# Patient Record
Sex: Female | Born: 1980 | Race: White | Hispanic: No | Smoking: Current every day smoker
Health system: Southern US, Community
[De-identification: ages and names within clinical notes are randomized; demographics above are authoritative.]

## PROBLEM LIST (undated history)

## (undated) ENCOUNTER — Inpatient Hospital Stay (HOSPITAL_COMMUNITY): Payer: Self-pay

## (undated) ENCOUNTER — Emergency Department (HOSPITAL_COMMUNITY): Disposition: A | Discharge status: Home / Self Care | Payer: Medicaid Other

## (undated) DIAGNOSIS — IMO0002 Reserved for concepts with insufficient information to code with codable children: Secondary | ICD-10-CM

## (undated) DIAGNOSIS — D649 Anemia, unspecified: Secondary | ICD-10-CM

## (undated) DIAGNOSIS — R51 Headache: Secondary | ICD-10-CM

## (undated) DIAGNOSIS — J189 Pneumonia, unspecified organism: Secondary | ICD-10-CM

## (undated) DIAGNOSIS — R87619 Unspecified abnormal cytological findings in specimens from cervix uteri: Secondary | ICD-10-CM

## (undated) DIAGNOSIS — O139 Gestational [pregnancy-induced] hypertension without significant proteinuria, unspecified trimester: Secondary | ICD-10-CM

## (undated) DIAGNOSIS — F419 Anxiety disorder, unspecified: Secondary | ICD-10-CM

## (undated) DIAGNOSIS — F53 Postpartum depression: Secondary | ICD-10-CM

## (undated) DIAGNOSIS — A5901 Trichomonal vulvovaginitis: Secondary | ICD-10-CM

## (undated) DIAGNOSIS — F32A Depression, unspecified: Secondary | ICD-10-CM

## (undated) DIAGNOSIS — O4700 False labor before 37 completed weeks of gestation, unspecified trimester: Secondary | ICD-10-CM

## (undated) DIAGNOSIS — O34219 Maternal care for unspecified type scar from previous cesarean delivery: Secondary | ICD-10-CM

## (undated) DIAGNOSIS — F1920 Other psychoactive substance dependence, uncomplicated: Secondary | ICD-10-CM

## (undated) DIAGNOSIS — K219 Gastro-esophageal reflux disease without esophagitis: Secondary | ICD-10-CM

## (undated) DIAGNOSIS — R87629 Unspecified abnormal cytological findings in specimens from vagina: Secondary | ICD-10-CM

## (undated) DIAGNOSIS — Z789 Other specified health status: Secondary | ICD-10-CM

## (undated) DIAGNOSIS — F329 Major depressive disorder, single episode, unspecified: Secondary | ICD-10-CM

## (undated) HISTORY — DX: Depression, unspecified: F32.A

## (undated) HISTORY — DX: Postpartum depression: F53.0

## (undated) HISTORY — PX: WISDOM TOOTH EXTRACTION: SHX21

## (undated) HISTORY — DX: Reserved for concepts with insufficient information to code with codable children: IMO0002

## (undated) HISTORY — DX: Pneumonia, unspecified organism: J18.9

## (undated) HISTORY — DX: Unspecified abnormal cytological findings in specimens from cervix uteri: R87.619

## (undated) HISTORY — DX: Major depressive disorder, single episode, unspecified: F32.9

## (undated) HISTORY — DX: Anxiety disorder, unspecified: F41.9

## (undated) HISTORY — DX: Gestational (pregnancy-induced) hypertension without significant proteinuria, unspecified trimester: O13.9

## (undated) HISTORY — DX: Anemia, unspecified: D64.9

## (undated) HISTORY — DX: False labor before 37 completed weeks of gestation, unspecified trimester: O47.00

## (undated) HISTORY — DX: Unspecified abnormal cytological findings in specimens from vagina: R87.629

## (undated) HISTORY — PX: APPENDECTOMY: SHX54

## (undated) HISTORY — PX: COLPOSCOPY: SHX161

---
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
## 2001-02-11 ENCOUNTER — Encounter: Payer: Self-pay | Admitting: Emergency Medicine

## 2001-02-11 ENCOUNTER — Emergency Department (HOSPITAL_COMMUNITY): Admission: EM | Admit: 2001-02-11 | Discharge: 2001-02-11 | Payer: Self-pay | Admitting: Emergency Medicine

## 2001-05-31 ENCOUNTER — Encounter: Admission: RE | Admit: 2001-05-31 | Discharge: 2001-05-31 | Payer: Self-pay | Admitting: Internal Medicine

## 2001-07-05 ENCOUNTER — Encounter: Admission: RE | Admit: 2001-07-05 | Discharge: 2001-07-05 | Payer: Self-pay | Admitting: Internal Medicine

## 2001-07-07 ENCOUNTER — Encounter: Admission: RE | Admit: 2001-07-07 | Discharge: 2001-07-07 | Payer: Self-pay | Admitting: Internal Medicine

## 2001-07-15 ENCOUNTER — Emergency Department (HOSPITAL_COMMUNITY): Admission: EM | Admit: 2001-07-15 | Discharge: 2001-07-15 | Payer: Self-pay | Admitting: Emergency Medicine

## 2001-07-15 ENCOUNTER — Encounter: Payer: Self-pay | Admitting: Emergency Medicine

## 2001-07-18 ENCOUNTER — Encounter: Admission: RE | Admit: 2001-07-18 | Discharge: 2001-07-18 | Payer: Self-pay | Admitting: Internal Medicine

## 2001-07-20 ENCOUNTER — Emergency Department (HOSPITAL_COMMUNITY): Admission: EM | Admit: 2001-07-20 | Discharge: 2001-07-20 | Payer: Self-pay | Admitting: Emergency Medicine

## 2001-09-15 ENCOUNTER — Emergency Department (HOSPITAL_COMMUNITY): Admission: EM | Admit: 2001-09-15 | Discharge: 2001-09-15 | Payer: Self-pay | Admitting: Emergency Medicine

## 2001-10-27 ENCOUNTER — Encounter: Admission: RE | Admit: 2001-10-27 | Discharge: 2001-10-27 | Payer: Self-pay | Admitting: Internal Medicine

## 2001-11-01 ENCOUNTER — Encounter: Admission: RE | Admit: 2001-11-01 | Discharge: 2001-11-01 | Payer: Self-pay | Admitting: Internal Medicine

## 2001-11-30 ENCOUNTER — Encounter: Admission: RE | Admit: 2001-11-30 | Discharge: 2001-11-30 | Payer: Self-pay | Admitting: Internal Medicine

## 2001-12-28 ENCOUNTER — Emergency Department (HOSPITAL_COMMUNITY): Admission: EM | Admit: 2001-12-28 | Discharge: 2001-12-28 | Payer: Self-pay | Admitting: Emergency Medicine

## 2002-02-22 ENCOUNTER — Encounter: Admission: RE | Admit: 2002-02-22 | Discharge: 2002-02-22 | Payer: Self-pay | Admitting: Internal Medicine

## 2002-06-19 ENCOUNTER — Inpatient Hospital Stay: Admission: AD | Admit: 2002-06-19 | Discharge: 2002-06-19 | Payer: Self-pay | Admitting: *Deleted

## 2002-06-21 ENCOUNTER — Emergency Department (HOSPITAL_COMMUNITY): Admission: EM | Admit: 2002-06-21 | Discharge: 2002-06-22 | Payer: Self-pay | Admitting: Emergency Medicine

## 2002-10-25 ENCOUNTER — Inpatient Hospital Stay (HOSPITAL_COMMUNITY): Admission: AD | Admit: 2002-10-25 | Discharge: 2002-10-25 | Payer: Self-pay | Admitting: Obstetrics and Gynecology

## 2003-01-10 ENCOUNTER — Inpatient Hospital Stay (HOSPITAL_COMMUNITY): Admission: AD | Admit: 2003-01-10 | Discharge: 2003-01-10 | Payer: Self-pay | Admitting: Obstetrics and Gynecology

## 2003-03-30 ENCOUNTER — Inpatient Hospital Stay (HOSPITAL_COMMUNITY): Admission: AD | Admit: 2003-03-30 | Discharge: 2003-03-31 | Payer: Self-pay | Admitting: *Deleted

## 2003-04-03 ENCOUNTER — Inpatient Hospital Stay (HOSPITAL_COMMUNITY): Admission: AD | Admit: 2003-04-03 | Discharge: 2003-04-03 | Payer: Self-pay | Admitting: Family Medicine

## 2003-04-08 ENCOUNTER — Emergency Department (HOSPITAL_COMMUNITY): Admission: EM | Admit: 2003-04-08 | Discharge: 2003-04-08 | Payer: Self-pay | Admitting: Emergency Medicine

## 2003-04-29 ENCOUNTER — Inpatient Hospital Stay (HOSPITAL_COMMUNITY): Admission: AD | Admit: 2003-04-29 | Discharge: 2003-04-30 | Payer: Self-pay | Admitting: Family Medicine

## 2003-05-03 ENCOUNTER — Ambulatory Visit (HOSPITAL_COMMUNITY): Admission: RE | Admit: 2003-05-03 | Discharge: 2003-05-03 | Payer: Self-pay | Admitting: *Deleted

## 2003-05-13 ENCOUNTER — Inpatient Hospital Stay (HOSPITAL_COMMUNITY): Admission: AD | Admit: 2003-05-13 | Discharge: 2003-05-13 | Payer: Self-pay | Admitting: *Deleted

## 2004-05-12 ENCOUNTER — Inpatient Hospital Stay (HOSPITAL_COMMUNITY): Admission: AD | Admit: 2004-05-12 | Discharge: 2004-05-12 | Payer: Self-pay | Admitting: Obstetrics & Gynecology

## 2004-05-21 ENCOUNTER — Inpatient Hospital Stay (HOSPITAL_COMMUNITY): Admission: RE | Admit: 2004-05-21 | Discharge: 2004-05-21 | Payer: Self-pay | Admitting: *Deleted

## 2004-05-26 ENCOUNTER — Ambulatory Visit: Payer: Self-pay | Admitting: *Deleted

## 2004-05-26 ENCOUNTER — Ambulatory Visit (HOSPITAL_COMMUNITY): Admission: RE | Admit: 2004-05-26 | Discharge: 2004-05-26 | Payer: Self-pay | Admitting: *Deleted

## 2004-06-04 ENCOUNTER — Ambulatory Visit: Payer: Self-pay | Admitting: *Deleted

## 2004-07-09 ENCOUNTER — Ambulatory Visit: Payer: Self-pay | Admitting: *Deleted

## 2004-07-21 ENCOUNTER — Ambulatory Visit (HOSPITAL_COMMUNITY): Admission: RE | Admit: 2004-07-21 | Discharge: 2004-07-21 | Payer: Self-pay | Admitting: *Deleted

## 2004-07-30 ENCOUNTER — Ambulatory Visit: Payer: Self-pay | Admitting: Family Medicine

## 2004-08-13 ENCOUNTER — Ambulatory Visit (HOSPITAL_COMMUNITY): Admission: RE | Admit: 2004-08-13 | Discharge: 2004-08-13 | Payer: Self-pay | Admitting: *Deleted

## 2004-08-13 ENCOUNTER — Ambulatory Visit: Payer: Self-pay | Admitting: Family Medicine

## 2004-09-10 ENCOUNTER — Ambulatory Visit: Payer: Self-pay | Admitting: Family Medicine

## 2004-09-17 ENCOUNTER — Ambulatory Visit: Payer: Self-pay | Admitting: *Deleted

## 2004-09-17 ENCOUNTER — Ambulatory Visit (HOSPITAL_COMMUNITY): Admission: RE | Admit: 2004-09-17 | Discharge: 2004-09-17 | Payer: Self-pay | Admitting: *Deleted

## 2004-09-18 ENCOUNTER — Inpatient Hospital Stay (HOSPITAL_COMMUNITY): Admission: RE | Admit: 2004-09-18 | Discharge: 2004-09-18 | Payer: Self-pay | Admitting: *Deleted

## 2004-09-18 ENCOUNTER — Ambulatory Visit: Payer: Self-pay | Admitting: *Deleted

## 2004-09-22 ENCOUNTER — Inpatient Hospital Stay (HOSPITAL_COMMUNITY): Admission: AD | Admit: 2004-09-22 | Discharge: 2004-09-24 | Payer: Self-pay | Admitting: Obstetrics & Gynecology

## 2004-09-22 ENCOUNTER — Ambulatory Visit: Payer: Self-pay | Admitting: Family Medicine

## 2005-02-05 ENCOUNTER — Ambulatory Visit (HOSPITAL_COMMUNITY): Admission: RE | Admit: 2005-02-05 | Discharge: 2005-02-05 | Payer: Self-pay | Admitting: Family Medicine

## 2005-02-05 ENCOUNTER — Emergency Department (HOSPITAL_COMMUNITY): Admission: EM | Admit: 2005-02-05 | Discharge: 2005-02-05 | Payer: Self-pay | Admitting: Family Medicine

## 2005-02-22 ENCOUNTER — Emergency Department (HOSPITAL_COMMUNITY): Admission: EM | Admit: 2005-02-22 | Discharge: 2005-02-22 | Payer: Self-pay | Admitting: Family Medicine

## 2005-03-24 ENCOUNTER — Inpatient Hospital Stay (HOSPITAL_COMMUNITY): Admission: AD | Admit: 2005-03-24 | Discharge: 2005-03-24 | Payer: Self-pay | Admitting: *Deleted

## 2005-03-31 ENCOUNTER — Emergency Department (HOSPITAL_COMMUNITY): Admission: EM | Admit: 2005-03-31 | Discharge: 2005-03-31 | Payer: Self-pay | Admitting: Family Medicine

## 2005-04-05 ENCOUNTER — Inpatient Hospital Stay (HOSPITAL_COMMUNITY): Admission: AD | Admit: 2005-04-05 | Discharge: 2005-04-05 | Payer: Self-pay | Admitting: Family Medicine

## 2005-04-06 ENCOUNTER — Inpatient Hospital Stay (HOSPITAL_COMMUNITY): Admission: AD | Admit: 2005-04-06 | Discharge: 2005-04-06 | Payer: Self-pay | Admitting: Obstetrics and Gynecology

## 2005-05-10 ENCOUNTER — Emergency Department (HOSPITAL_COMMUNITY): Admission: EM | Admit: 2005-05-10 | Discharge: 2005-05-10 | Payer: Self-pay | Admitting: Family Medicine

## 2005-05-22 ENCOUNTER — Emergency Department (HOSPITAL_COMMUNITY): Admission: EM | Admit: 2005-05-22 | Discharge: 2005-05-22 | Payer: Self-pay | Admitting: Family Medicine

## 2005-05-25 ENCOUNTER — Emergency Department (HOSPITAL_COMMUNITY): Admission: EM | Admit: 2005-05-25 | Discharge: 2005-05-25 | Payer: Self-pay | Admitting: Family Medicine

## 2005-06-02 ENCOUNTER — Emergency Department (HOSPITAL_COMMUNITY): Admission: EM | Admit: 2005-06-02 | Discharge: 2005-06-02 | Payer: Self-pay | Admitting: Family Medicine

## 2005-06-05 ENCOUNTER — Emergency Department (HOSPITAL_COMMUNITY): Admission: EM | Admit: 2005-06-05 | Discharge: 2005-06-05 | Payer: Self-pay | Admitting: Family Medicine

## 2005-06-07 ENCOUNTER — Inpatient Hospital Stay (HOSPITAL_COMMUNITY): Admission: AD | Admit: 2005-06-07 | Discharge: 2005-06-07 | Payer: Self-pay | Admitting: *Deleted

## 2005-07-06 ENCOUNTER — Emergency Department (HOSPITAL_COMMUNITY): Admission: EM | Admit: 2005-07-06 | Discharge: 2005-07-06 | Payer: Self-pay | Admitting: Family Medicine

## 2005-08-16 ENCOUNTER — Emergency Department (HOSPITAL_COMMUNITY): Admission: EM | Admit: 2005-08-16 | Discharge: 2005-08-16 | Payer: Self-pay | Admitting: Family Medicine

## 2005-10-11 ENCOUNTER — Emergency Department (HOSPITAL_COMMUNITY): Admission: EM | Admit: 2005-10-11 | Discharge: 2005-10-11 | Payer: Self-pay | Admitting: Emergency Medicine

## 2005-12-10 ENCOUNTER — Emergency Department (HOSPITAL_COMMUNITY): Admission: EM | Admit: 2005-12-10 | Discharge: 2005-12-11 | Payer: Self-pay | Admitting: Emergency Medicine

## 2005-12-20 ENCOUNTER — Emergency Department (HOSPITAL_COMMUNITY): Admission: EM | Admit: 2005-12-20 | Discharge: 2005-12-20 | Payer: Self-pay | Admitting: Family Medicine

## 2005-12-22 ENCOUNTER — Inpatient Hospital Stay (HOSPITAL_COMMUNITY): Admission: AD | Admit: 2005-12-22 | Discharge: 2005-12-22 | Payer: Self-pay | Admitting: Gynecology

## 2006-01-03 ENCOUNTER — Inpatient Hospital Stay (HOSPITAL_COMMUNITY): Admission: AD | Admit: 2006-01-03 | Discharge: 2006-01-03 | Payer: Self-pay | Admitting: Family Medicine

## 2006-02-14 ENCOUNTER — Inpatient Hospital Stay (HOSPITAL_COMMUNITY): Admission: AD | Admit: 2006-02-14 | Discharge: 2006-02-14 | Payer: Self-pay | Admitting: Obstetrics & Gynecology

## 2006-02-25 ENCOUNTER — Inpatient Hospital Stay (HOSPITAL_COMMUNITY): Admission: AD | Admit: 2006-02-25 | Discharge: 2006-02-25 | Payer: Self-pay | Admitting: Obstetrics & Gynecology

## 2006-03-19 ENCOUNTER — Emergency Department (HOSPITAL_COMMUNITY): Admission: EM | Admit: 2006-03-19 | Discharge: 2006-03-19 | Payer: Self-pay | Admitting: Emergency Medicine

## 2006-04-05 ENCOUNTER — Inpatient Hospital Stay (HOSPITAL_COMMUNITY): Admission: AD | Admit: 2006-04-05 | Discharge: 2006-04-05 | Payer: Self-pay | Admitting: Obstetrics & Gynecology

## 2006-04-12 ENCOUNTER — Ambulatory Visit (HOSPITAL_COMMUNITY): Admission: RE | Admit: 2006-04-12 | Discharge: 2006-04-12 | Payer: Self-pay | Admitting: Obstetrics & Gynecology

## 2006-04-12 ENCOUNTER — Inpatient Hospital Stay (HOSPITAL_COMMUNITY): Admission: AD | Admit: 2006-04-12 | Discharge: 2006-04-13 | Payer: Self-pay | Admitting: Obstetrics & Gynecology

## 2006-04-17 ENCOUNTER — Emergency Department (HOSPITAL_COMMUNITY): Admission: EM | Admit: 2006-04-17 | Discharge: 2006-04-17 | Payer: Self-pay | Admitting: Family Medicine

## 2006-04-27 ENCOUNTER — Ambulatory Visit (HOSPITAL_COMMUNITY): Admission: RE | Admit: 2006-04-27 | Discharge: 2006-04-27 | Payer: Self-pay | Admitting: Family Medicine

## 2006-04-28 ENCOUNTER — Inpatient Hospital Stay (HOSPITAL_COMMUNITY): Admission: AD | Admit: 2006-04-28 | Discharge: 2006-04-28 | Payer: Self-pay | Admitting: Obstetrics & Gynecology

## 2006-04-28 ENCOUNTER — Ambulatory Visit: Payer: Self-pay | Admitting: Family Medicine

## 2006-05-20 ENCOUNTER — Ambulatory Visit (HOSPITAL_COMMUNITY): Admission: RE | Admit: 2006-05-20 | Discharge: 2006-05-20 | Payer: Self-pay | Admitting: Family Medicine

## 2006-06-09 ENCOUNTER — Ambulatory Visit: Payer: Self-pay | Admitting: Obstetrics & Gynecology

## 2006-06-12 ENCOUNTER — Inpatient Hospital Stay (HOSPITAL_COMMUNITY): Admission: AD | Admit: 2006-06-12 | Discharge: 2006-06-12 | Payer: Self-pay | Admitting: Family Medicine

## 2006-06-12 ENCOUNTER — Ambulatory Visit: Payer: Self-pay | Admitting: Certified Nurse Midwife

## 2006-06-17 ENCOUNTER — Inpatient Hospital Stay (HOSPITAL_COMMUNITY): Admission: AD | Admit: 2006-06-17 | Discharge: 2006-06-18 | Payer: Self-pay | Admitting: Gynecology

## 2006-06-17 ENCOUNTER — Ambulatory Visit: Payer: Self-pay | Admitting: Obstetrics and Gynecology

## 2006-06-23 ENCOUNTER — Ambulatory Visit: Payer: Self-pay | Admitting: Family Medicine

## 2006-07-07 ENCOUNTER — Ambulatory Visit: Payer: Self-pay | Admitting: *Deleted

## 2006-07-14 ENCOUNTER — Ambulatory Visit: Payer: Self-pay | Admitting: Obstetrics & Gynecology

## 2006-07-15 ENCOUNTER — Ambulatory Visit (HOSPITAL_COMMUNITY): Admission: RE | Admit: 2006-07-15 | Discharge: 2006-07-15 | Payer: Self-pay | Admitting: Family Medicine

## 2006-07-17 ENCOUNTER — Ambulatory Visit: Payer: Self-pay | Admitting: Obstetrics and Gynecology

## 2006-07-17 ENCOUNTER — Inpatient Hospital Stay (HOSPITAL_COMMUNITY): Admission: AD | Admit: 2006-07-17 | Discharge: 2006-07-17 | Payer: Self-pay | Admitting: Obstetrics & Gynecology

## 2006-07-22 ENCOUNTER — Ambulatory Visit (HOSPITAL_COMMUNITY): Admission: RE | Admit: 2006-07-22 | Discharge: 2006-07-22 | Payer: Self-pay | Admitting: Family Medicine

## 2006-07-25 ENCOUNTER — Ambulatory Visit: Payer: Self-pay | Admitting: Obstetrics & Gynecology

## 2006-07-25 ENCOUNTER — Ambulatory Visit (HOSPITAL_COMMUNITY): Admission: RE | Admit: 2006-07-25 | Discharge: 2006-07-25 | Payer: Self-pay | Admitting: Family Medicine

## 2006-07-28 ENCOUNTER — Ambulatory Visit: Payer: Self-pay | Admitting: Gynecology

## 2006-07-28 ENCOUNTER — Inpatient Hospital Stay (HOSPITAL_COMMUNITY): Admission: AD | Admit: 2006-07-28 | Discharge: 2006-08-01 | Payer: Self-pay | Admitting: Gynecology

## 2006-08-06 ENCOUNTER — Inpatient Hospital Stay (HOSPITAL_COMMUNITY): Admission: AD | Admit: 2006-08-06 | Discharge: 2006-08-06 | Payer: Self-pay | Admitting: Obstetrics and Gynecology

## 2006-09-02 ENCOUNTER — Inpatient Hospital Stay (HOSPITAL_COMMUNITY): Admission: AD | Admit: 2006-09-02 | Discharge: 2006-09-02 | Payer: Self-pay | Admitting: Obstetrics and Gynecology

## 2006-09-03 ENCOUNTER — Inpatient Hospital Stay (HOSPITAL_COMMUNITY): Admission: AD | Admit: 2006-09-03 | Discharge: 2006-09-03 | Payer: Self-pay | Admitting: Obstetrics & Gynecology

## 2006-09-13 ENCOUNTER — Inpatient Hospital Stay (HOSPITAL_COMMUNITY): Admission: AD | Admit: 2006-09-13 | Discharge: 2006-09-13 | Payer: Self-pay | Admitting: Obstetrics & Gynecology

## 2006-12-04 ENCOUNTER — Emergency Department (HOSPITAL_COMMUNITY): Admission: EM | Admit: 2006-12-04 | Discharge: 2006-12-04 | Payer: Self-pay | Admitting: Emergency Medicine

## 2007-01-03 ENCOUNTER — Emergency Department (HOSPITAL_COMMUNITY): Admission: EM | Admit: 2007-01-03 | Discharge: 2007-01-03 | Payer: Self-pay | Admitting: Family Medicine

## 2007-09-10 ENCOUNTER — Emergency Department (HOSPITAL_COMMUNITY): Admission: EM | Admit: 2007-09-10 | Discharge: 2007-09-10 | Payer: Self-pay | Admitting: Family Medicine

## 2008-01-26 HISTORY — PX: DILATION AND CURETTAGE OF UTERUS: SHX78

## 2008-03-04 ENCOUNTER — Emergency Department (HOSPITAL_COMMUNITY): Admission: EM | Admit: 2008-03-04 | Discharge: 2008-03-04 | Payer: Self-pay | Admitting: Family Medicine

## 2008-05-13 ENCOUNTER — Emergency Department (HOSPITAL_COMMUNITY): Admission: EM | Admit: 2008-05-13 | Discharge: 2008-05-13 | Payer: Self-pay | Admitting: Emergency Medicine

## 2008-06-20 ENCOUNTER — Emergency Department (HOSPITAL_COMMUNITY): Admission: EM | Admit: 2008-06-20 | Discharge: 2008-06-20 | Payer: Self-pay | Admitting: Emergency Medicine

## 2008-09-14 ENCOUNTER — Emergency Department (HOSPITAL_COMMUNITY): Admission: EM | Admit: 2008-09-14 | Discharge: 2008-09-14 | Payer: Self-pay | Admitting: Family Medicine

## 2008-10-09 ENCOUNTER — Emergency Department (HOSPITAL_COMMUNITY): Admission: EM | Admit: 2008-10-09 | Discharge: 2008-10-09 | Payer: Self-pay | Admitting: Family Medicine

## 2008-10-26 ENCOUNTER — Emergency Department (HOSPITAL_COMMUNITY): Admission: EM | Admit: 2008-10-26 | Discharge: 2008-10-26 | Payer: Self-pay | Admitting: Family Medicine

## 2008-10-31 ENCOUNTER — Emergency Department (HOSPITAL_COMMUNITY): Admission: EM | Admit: 2008-10-31 | Discharge: 2008-10-31 | Payer: Self-pay | Admitting: Emergency Medicine

## 2008-11-12 ENCOUNTER — Emergency Department (HOSPITAL_COMMUNITY): Admission: EM | Admit: 2008-11-12 | Discharge: 2008-11-12 | Payer: Self-pay | Admitting: Emergency Medicine

## 2009-02-05 ENCOUNTER — Emergency Department (HOSPITAL_COMMUNITY): Admission: EM | Admit: 2009-02-05 | Discharge: 2009-02-05 | Payer: Self-pay | Admitting: Family Medicine

## 2009-02-10 ENCOUNTER — Emergency Department (HOSPITAL_COMMUNITY): Admission: EM | Admit: 2009-02-10 | Discharge: 2009-02-10 | Payer: Self-pay | Admitting: Emergency Medicine

## 2009-03-13 ENCOUNTER — Emergency Department (HOSPITAL_COMMUNITY): Admission: EM | Admit: 2009-03-13 | Discharge: 2009-03-13 | Payer: Self-pay | Admitting: Family Medicine

## 2009-06-03 ENCOUNTER — Emergency Department (HOSPITAL_COMMUNITY): Admission: EM | Admit: 2009-06-03 | Discharge: 2009-06-03 | Payer: Self-pay | Admitting: Family Medicine

## 2009-06-14 ENCOUNTER — Emergency Department (HOSPITAL_COMMUNITY): Admission: EM | Admit: 2009-06-14 | Discharge: 2009-06-15 | Payer: Self-pay | Admitting: Emergency Medicine

## 2009-09-09 ENCOUNTER — Emergency Department (HOSPITAL_COMMUNITY): Admission: EM | Admit: 2009-09-09 | Discharge: 2009-09-09 | Payer: Self-pay | Admitting: Family Medicine

## 2009-09-21 ENCOUNTER — Emergency Department (HOSPITAL_COMMUNITY): Admission: EM | Admit: 2009-09-21 | Discharge: 2009-09-21 | Payer: Self-pay | Admitting: Family Medicine

## 2009-09-23 ENCOUNTER — Emergency Department (HOSPITAL_COMMUNITY): Admission: EM | Admit: 2009-09-23 | Discharge: 2009-09-23 | Payer: Self-pay | Admitting: Family Medicine

## 2009-09-27 ENCOUNTER — Ambulatory Visit: Payer: Self-pay | Admitting: Nurse Practitioner

## 2009-09-27 ENCOUNTER — Inpatient Hospital Stay (HOSPITAL_COMMUNITY): Admission: AD | Admit: 2009-09-27 | Discharge: 2009-09-27 | Payer: Self-pay | Admitting: Obstetrics and Gynecology

## 2009-10-02 ENCOUNTER — Emergency Department (HOSPITAL_COMMUNITY): Admission: EM | Admit: 2009-10-02 | Discharge: 2009-10-02 | Payer: Self-pay | Admitting: Family Medicine

## 2009-10-31 ENCOUNTER — Ambulatory Visit (HOSPITAL_COMMUNITY): Admission: AD | Admit: 2009-10-31 | Discharge: 2009-10-31 | Payer: Self-pay | Admitting: Obstetrics & Gynecology

## 2010-01-31 ENCOUNTER — Emergency Department (HOSPITAL_COMMUNITY)
Admission: EM | Admit: 2010-01-31 | Discharge: 2010-01-31 | Payer: Self-pay | Source: Home / Self Care | Admitting: Family Medicine

## 2010-02-15 ENCOUNTER — Encounter: Payer: Self-pay | Admitting: *Deleted

## 2010-02-15 ENCOUNTER — Encounter: Payer: Self-pay | Admitting: Obstetrics & Gynecology

## 2010-02-18 ENCOUNTER — Emergency Department (HOSPITAL_COMMUNITY)
Admission: EM | Admit: 2010-02-18 | Discharge: 2010-02-18 | Payer: Self-pay | Source: Home / Self Care | Admitting: Family Medicine

## 2010-02-22 ENCOUNTER — Emergency Department (HOSPITAL_COMMUNITY)
Admission: EM | Admit: 2010-02-22 | Discharge: 2010-02-22 | Payer: Self-pay | Source: Home / Self Care | Admitting: Family Medicine

## 2010-02-22 LAB — WET PREP, GENITAL
Clue Cells Wet Prep HPF POC: NONE SEEN
Trich, Wet Prep: NONE SEEN

## 2010-02-23 LAB — POCT URINALYSIS DIPSTICK
Bilirubin Urine: NEGATIVE
Ketones, ur: NEGATIVE mg/dL
Nitrite: NEGATIVE
Urine Glucose, Fasting: NEGATIVE mg/dL

## 2010-02-24 LAB — GC/CHLAMYDIA PROBE AMP, GENITAL
Chlamydia, DNA Probe: NEGATIVE
GC Probe Amp, Genital: NEGATIVE

## 2010-04-08 LAB — CBC
MCV: 99.9 fL (ref 78.0–100.0)
Platelets: 257 10*3/uL (ref 150–400)
RDW: 14.1 % (ref 11.5–15.5)
WBC: 11.5 10*3/uL — ABNORMAL HIGH (ref 4.0–10.5)

## 2010-04-09 LAB — URINE CULTURE: Culture  Setup Time: 201108161847

## 2010-04-09 LAB — POCT URINALYSIS DIPSTICK
Glucose, UA: NEGATIVE mg/dL
Hgb urine dipstick: NEGATIVE
Nitrite: NEGATIVE
Urobilinogen, UA: 0.2 mg/dL (ref 0.0–1.0)

## 2010-04-09 LAB — WET PREP, GENITAL: Yeast Wet Prep HPF POC: NONE SEEN

## 2010-04-09 LAB — URINALYSIS, ROUTINE W REFLEX MICROSCOPIC
Bilirubin Urine: NEGATIVE
Nitrite: NEGATIVE
Specific Gravity, Urine: 1.01 (ref 1.005–1.030)
Urobilinogen, UA: 0.2 mg/dL (ref 0.0–1.0)

## 2010-04-09 LAB — GC/CHLAMYDIA PROBE AMP, GENITAL
Chlamydia, DNA Probe: NEGATIVE
GC Probe Amp, Genital: NEGATIVE

## 2010-04-14 ENCOUNTER — Inpatient Hospital Stay (HOSPITAL_COMMUNITY): Admission: RE | Admit: 2010-04-14 | Payer: Self-pay | Source: Ambulatory Visit

## 2010-04-14 LAB — CBC
HCT: 38.8 % (ref 36.0–46.0)
Hemoglobin: 13.8 g/dL (ref 12.0–15.0)
MCHC: 35.6 g/dL (ref 30.0–36.0)
MCV: 99.2 fL (ref 78.0–100.0)
Platelets: 244 10*3/uL (ref 150–400)
RBC: 3.91 MIL/uL (ref 3.87–5.11)
RDW: 13.3 % (ref 11.5–15.5)
WBC: 8.4 10*3/uL (ref 4.0–10.5)

## 2010-04-14 LAB — POCT I-STAT, CHEM 8
BUN: 9 mg/dL (ref 6–23)
Chloride: 105 mEq/L (ref 96–112)
Creatinine, Ser: 0.5 mg/dL (ref 0.4–1.2)
Potassium: 3.8 mEq/L (ref 3.5–5.1)
Sodium: 142 mEq/L (ref 135–145)
TCO2: 27 mmol/L (ref 0–100)

## 2010-04-14 LAB — WET PREP, GENITAL
Trich, Wet Prep: NONE SEEN
WBC, Wet Prep HPF POC: NONE SEEN
Yeast Wet Prep HPF POC: NONE SEEN

## 2010-04-14 LAB — DIFFERENTIAL
Basophils Absolute: 0 10*3/uL (ref 0.0–0.1)
Basophils Relative: 1 % (ref 0–1)
Eosinophils Absolute: 0.1 10*3/uL (ref 0.0–0.7)
Eosinophils Relative: 2 % (ref 0–5)
Lymphocytes Relative: 29 % (ref 12–46)
Lymphs Abs: 2.5 10*3/uL (ref 0.7–4.0)
Monocytes Absolute: 0.6 10*3/uL (ref 0.1–1.0)
Monocytes Relative: 7 % (ref 3–12)
Neutro Abs: 5.2 10*3/uL (ref 1.7–7.7)
Neutrophils Relative %: 62 % (ref 43–77)

## 2010-04-14 LAB — POCT URINALYSIS DIP (DEVICE)
Bilirubin Urine: NEGATIVE
Hgb urine dipstick: NEGATIVE
Ketones, ur: NEGATIVE mg/dL
pH: 7 (ref 5.0–8.0)

## 2010-04-14 LAB — POCT PREGNANCY, URINE: Preg Test, Ur: NEGATIVE

## 2010-04-15 LAB — WET PREP, GENITAL
Trich, Wet Prep: NONE SEEN
Yeast Wet Prep HPF POC: NONE SEEN

## 2010-04-15 LAB — POCT URINALYSIS DIP (DEVICE)
Nitrite: NEGATIVE
Protein, ur: NEGATIVE mg/dL
pH: 5 (ref 5.0–8.0)

## 2010-04-15 LAB — GC/CHLAMYDIA PROBE AMP, GENITAL
Chlamydia, DNA Probe: NEGATIVE
GC Probe Amp, Genital: NEGATIVE

## 2010-04-30 LAB — POCT RAPID STREP A (OFFICE): Streptococcus, Group A Screen (Direct): NEGATIVE

## 2010-04-30 LAB — POCT URINALYSIS DIP (DEVICE)
Glucose, UA: 100 mg/dL — AB
Ketones, ur: 15 mg/dL — AB
Protein, ur: >=300 mg/dL — AB
Urobilinogen, UA: 1 mg/dL (ref 0.0–1.0)

## 2010-04-30 LAB — POCT PREGNANCY, URINE: Preg Test, Ur: NEGATIVE

## 2010-05-01 ENCOUNTER — Inpatient Hospital Stay (INDEPENDENT_AMBULATORY_CARE_PROVIDER_SITE_OTHER)
Admission: RE | Admit: 2010-05-01 | Discharge: 2010-05-01 | Disposition: A | Discharge status: Home / Self Care | Payer: Self-pay | Source: Ambulatory Visit | Attending: Family Medicine | Admitting: Family Medicine

## 2010-05-01 DIAGNOSIS — N390 Urinary tract infection, site not specified: Secondary | ICD-10-CM

## 2010-05-01 LAB — POCT PREGNANCY, URINE: Preg Test, Ur: NEGATIVE

## 2010-05-01 LAB — POCT URINALYSIS DIP (DEVICE)
Bilirubin Urine: NEGATIVE
Glucose, UA: NEGATIVE mg/dL
Ketones, ur: NEGATIVE mg/dL

## 2010-05-01 LAB — POCT RAPID STREP A (OFFICE): Streptococcus, Group A Screen (Direct): NEGATIVE

## 2010-05-02 LAB — WET PREP, GENITAL

## 2010-05-02 LAB — GC/CHLAMYDIA PROBE AMP, GENITAL: Chlamydia, DNA Probe: NEGATIVE

## 2010-05-02 LAB — POCT URINALYSIS DIP (DEVICE)
Bilirubin Urine: NEGATIVE
Glucose, UA: NEGATIVE mg/dL
Protein, ur: NEGATIVE mg/dL

## 2010-05-02 LAB — RPR: RPR Ser Ql: NONREACTIVE

## 2010-05-02 LAB — POCT PREGNANCY, URINE: Preg Test, Ur: NEGATIVE

## 2010-05-11 ENCOUNTER — Inpatient Hospital Stay (INDEPENDENT_AMBULATORY_CARE_PROVIDER_SITE_OTHER)
Admission: RE | Admit: 2010-05-11 | Discharge: 2010-05-11 | Disposition: A | Discharge status: Home / Self Care | Payer: Medicaid Other | Source: Ambulatory Visit | Attending: Family Medicine | Admitting: Family Medicine

## 2010-05-11 DIAGNOSIS — J36 Peritonsillar abscess: Secondary | ICD-10-CM

## 2010-05-12 ENCOUNTER — Emergency Department (HOSPITAL_COMMUNITY)
Admission: EM | Admit: 2010-05-12 | Discharge: 2010-05-12 | Disposition: A | Discharge status: Home / Self Care | Payer: Medicaid Other | Attending: Emergency Medicine | Admitting: Emergency Medicine

## 2010-05-12 DIAGNOSIS — R509 Fever, unspecified: Secondary | ICD-10-CM | POA: Insufficient documentation

## 2010-05-12 DIAGNOSIS — R498 Other voice and resonance disorders: Secondary | ICD-10-CM | POA: Insufficient documentation

## 2010-05-12 DIAGNOSIS — J039 Acute tonsillitis, unspecified: Secondary | ICD-10-CM | POA: Insufficient documentation

## 2010-05-12 DIAGNOSIS — Z8619 Personal history of other infectious and parasitic diseases: Secondary | ICD-10-CM | POA: Insufficient documentation

## 2010-05-12 DIAGNOSIS — R259 Unspecified abnormal involuntary movements: Secondary | ICD-10-CM | POA: Insufficient documentation

## 2010-05-12 LAB — CBC
HCT: 38.3 % (ref 36.0–46.0)
MCHC: 34.2 g/dL (ref 30.0–36.0)
MCV: 95.5 fL (ref 78.0–100.0)
Platelets: 189 10*3/uL (ref 150–400)
RDW: 13.2 % (ref 11.5–15.5)
WBC: 19.5 10*3/uL — ABNORMAL HIGH (ref 4.0–10.5)

## 2010-05-12 LAB — DIFFERENTIAL
Eosinophils Absolute: 0 10*3/uL (ref 0.0–0.7)
Eosinophils Relative: 0 % (ref 0–5)
Lymphocytes Relative: 7 % — ABNORMAL LOW (ref 12–46)
Lymphs Abs: 1.4 10*3/uL (ref 0.7–4.0)
Monocytes Absolute: 1.1 10*3/uL — ABNORMAL HIGH (ref 0.1–1.0)

## 2010-05-12 LAB — RAPID STREP SCREEN (MED CTR MEBANE ONLY): Streptococcus, Group A Screen (Direct): NEGATIVE

## 2010-05-12 LAB — POCT URINALYSIS DIP (DEVICE)
Glucose, UA: NEGATIVE mg/dL
Specific Gravity, Urine: 1.025 (ref 1.005–1.030)
Urobilinogen, UA: 0.2 mg/dL (ref 0.0–1.0)

## 2010-05-12 LAB — POCT PREGNANCY, URINE: Preg Test, Ur: NEGATIVE

## 2010-05-26 NOTE — Consult Note (Signed)
  Erica [name], Erica [name]                  ACCOUNT NO.:  [id number]  MEDICAL RECORD NO.:  [id number]           PATIENT TYPE:  E  LOCATION:  [facility]                         FACILITY:  [facility]  PHYSICIAN:  Rodolfo Gaster H. Pollyann Kennedy, MD     DATE OF BIRTH:  26-Jan-1980  DATE OF CONSULTATION:  05/12/2010 DATE OF DISCHARGE:  05/12/2010                                CONSULTATION   REASON FOR CONSULTATION:  Severe sore throat.  HISTORY:  This is a 30 year old lady who started having bad sore throat Saturday night.  She saw Dr. Lazarus Salines in the office yesterday and was diagnosed with necrotizing tonsillitis.  Strep test has been negative twice.  She was started on amoxicillin yesterday and got worse in the past 24 hours, came to emergency department a little bit afternoon today.  She was found to have leukocytosis.  She is febrile.  She has not been eating much, has been drinking on and off, and last voided this morning.  She has a history of smoking about a pack per day, but has not smoked in the last couple of days.  No prior history of tonsil problems. She has had occasional sore throat, but nothing like this.  PHYSICAL EXAMINATION:  GENERAL:  Healthy and somewhat angry-appearing lady.  She is breathing well.  Her voice is slightly muffled.  She has minimal trismus. HEENT:  Oral cavity and pharynx reveals tongue, floor of mouth, and soft palate appear normal.  The tonsils are very large, the right being larger than the left, and there is necrotic-type exudate on the surface bilaterally, but more so on the right.  There is no evidence of edema of the soft palate and no uvular edema.  There is slightly tender adenopathy in the right level II area.  No other adenopathy is palpable.  IMPRESSION:  Persisting necrotizing tonsillitis, most likely viral in origin as we are seeing a fair amount of this in the past several weeks. We discussed the importance of keeping hydrated.  She needs to drink enough so that she  urinates at least 4 times a day.  If she falls below that, she needs to follow up for additional intravenous fluids.  I offered her the opportunity to stay in the hospital today to continue IV fluids, which she declined.  I think that is reasonable as long as she does continue to drink.  She is instructed to contact our office immediately if she gets any worse or if she stops voiding.  We discussed unfortunately there is nothing that will make this get better faster and that supportive symptomatic treatment is the only real treatment and monitoring of fluid status.  She will follow up with [place] if she does not start to get better in the next few days.  She will follow up sooner if she gets any worse.     Jahaziel Francois H. Pollyann Kennedy, MD     JHR/MEDQ  D:  05/12/2010  T:  05/13/2010  Job:  [id number]  Electronically Signed by Serena Colonel MD on 05/26/2010 09:25:39 PM

## 2010-06-07 ENCOUNTER — Inpatient Hospital Stay (HOSPITAL_COMMUNITY)
Admission: AD | Admit: 2010-06-07 | Discharge: 2010-06-07 | Disposition: A | Discharge status: Home / Self Care | Payer: Medicaid Other | Source: Ambulatory Visit | Attending: Obstetrics & Gynecology | Admitting: Obstetrics & Gynecology

## 2010-06-07 ENCOUNTER — Inpatient Hospital Stay (HOSPITAL_COMMUNITY): Payer: Medicaid Other

## 2010-06-07 DIAGNOSIS — R109 Unspecified abdominal pain: Secondary | ICD-10-CM | POA: Insufficient documentation

## 2010-06-07 LAB — CBC
MCV: 96.5 fL (ref 78.0–100.0)
Platelets: 254 10*3/uL (ref 150–400)
RBC: 4 MIL/uL (ref 3.87–5.11)
RDW: 13.2 % (ref 11.5–15.5)
WBC: 9.5 10*3/uL (ref 4.0–10.5)

## 2010-06-07 LAB — WET PREP, GENITAL
Clue Cells Wet Prep HPF POC: NONE SEEN
Trich, Wet Prep: NONE SEEN
Yeast Wet Prep HPF POC: NONE SEEN

## 2010-06-07 LAB — URINALYSIS, ROUTINE W REFLEX MICROSCOPIC
Bilirubin Urine: NEGATIVE
Glucose, UA: NEGATIVE mg/dL
Ketones, ur: NEGATIVE mg/dL
Protein, ur: NEGATIVE mg/dL
Urobilinogen, UA: 0.2 mg/dL (ref 0.0–1.0)

## 2010-06-08 LAB — GC/CHLAMYDIA PROBE AMP, GENITAL
Chlamydia, DNA Probe: NEGATIVE
GC Probe Amp, Genital: NEGATIVE

## 2010-06-09 NOTE — Discharge Summary (Signed)
[name], [name]                   ACCOUNT NO.:  [id number]   MEDICAL RECORD NO.:  [id number]          PATIENT TYPE:  INP   LOCATION:                                FACILITY:  [facility]   PHYSICIAN:  Phil D. Okey Dupre, M.D.     DATE OF BIRTH:  1980/06/04   DATE OF ADMISSION:  07/28/2006  DATE OF DISCHARGE:  08/01/2006                               DISCHARGE SUMMARY   ADMISSION DIAGNOSES:  A 30 year old, G5, P3-1-0-4 at 35 weeks sent for  induction for severe intrauterine growth restriction (IUGR).   DISCHARGE DIAGNOSES:  A 30 year old, G5, P3-2-0-5, status post low  transverse cesarean section secondary to nonreassuring fetal heart tones  and severe intrauterine growth restriction (IUGR).   PROCEDURES:  Low transverse cesarean section on July 29, 2006, performed  by Dr. Mia Creek, estimated blood loss 500 mL, anesthesia epidural.  Delivery of a viable female.  Please see dictation for further  information.   HOSPITAL COURSE:  [name] is a 30 year old, G5, P3-2-0-5 who was sent  for induction at 35 weeks for severe IUGR.  She was induced with  Pitocin, but developed nonreassuring fetal heart tones and thus was  taken to low transverse C-section.  Estimated blood loss was 500 mL.  There were no complications postop.  She delivered a viable female 4  pounds 12 ounces.  Apgar's were 9 and 9.   LABORATORY DATA AND X-RAY FINDINGS:  HIV nonreactive.  Blood type O  positive.  Antibody screen negative.  Rubella nonimmune.  The patient  received vaccine.  RPR nonreactive.  Hepatitis B surface antigen  negative.   PAST MEDICAL HISTORY:  1. Hepatitis C positive.  2. Positive smoker.   CONDITION ON DISCHARGE:  Good.   DISPOSITION:  Discharged to home with baby staying in the NICU.   DISCHARGE MEDICATIONS:  1. Percocet 1-2 tablets q.4 h. p.r.n. pain.  2. Ibuprofen 600 mg one tablet t.i.d. p.r.n. pain.  3. MiraLax 17 g by mouth p.o. daily p.r.n. constipation.  4. Iron sulfate 325 mg one tablet  p.o. daily.  5. Ortho Tri-Cyclen one tablet p.o. daily.  The patient intends on IUD      at 6-week appointment.   ACTIVITY:  No heavy lifting for the next 6 weeks.  No sexual relations  for the next 6 weeks.   DIET:  Routine.   WOUND CARE:  Please pat dry incisions after shower.  Showers preferred  over bath.   FOLLOW UP:  Followup appointment in 6 weeks at [facility]  Department.      Ancil Boozer, MD      Phil D. Okey Dupre, M.D.  Electronically Signed    SA/MEDQ  D:  08/01/2006  T:  08/01/2006  Job:  [id number]

## 2010-06-09 NOTE — Op Note (Signed)
Erica [name], Erica [name]                   ACCOUNT NO.:  [id number]   MEDICAL RECORD NO.:  [id number]          PATIENT TYPE:  INP   LOCATION:  9303                          FACILITY:  [facility]   PHYSICIAN:  Ginger Carne, MD  DATE OF BIRTH:  Mar 11, 1980   DATE OF PROCEDURE:  07/29/2006  DATE OF DISCHARGE:                               OPERATIVE REPORT   PREOPERATIVE DIAGNOSIS:  Nonreassuring fetal heart rate.   POSTOPERATIVE DIAGNOSIS:  Nonreassuring fetal heart rate, preterm viable  delivery of female infant.   PROCEDURE:  Primary low transverse cesarean section.   SURGEON:  Ginger Carne, M.D.   ASSISTANT:  None.   COMPLICATIONS:  None immediate.   ESTIMATED BLOOD LOSS:  500 mL.   ANESTHESIA:  Epidural.   SPECIMEN:  Cord blood and pH.   OPERATIVE FINDINGS:  Term infant female delivered in vertex  presentation.  Apgar and weight per delivery room record, no gross  abnormalities.  Baby cried spontaneously at delivery.  Cord bloods pH  were obtained. The placenta had three-vessel cord central insertion  complete placenta.  Amniotic fluid was clear, non foul-smelling. Uterus,  tubes and ovaries showed normal decidual changes of pregnancy.   OPERATIVE PROCEDURE:  The patient prepped and draped in usual fashion  and placed in the left lateral supine position.  Betadine solution used  for antiseptic and the patient was catheterized prior to procedure.  After adequate epidural analgesia a Pfannenstiel incision was made.  The  abdomen opened.  Bladder flap incised transversely.  Lower uterine  segment incised transversely.  Baby delivered, cord clamped, cut and  infant given to the pediatric staff after bulb suctioning.  Placenta  removed manually.  Uterus inspected.  Closure of the uterine musculature  in one layers of 0 Vicryl running interlocking suture.  Bleeding points  hemostatically checked.  Blood clots removed.  Closure of the parietal.  Closure of the fascia in one layers  of 0 Vicryl suture and skin staples  for the skin.  Instrument and sponge count were correct.  The patient  tolerated the procedure well, returned to the post anesthesia recovery  room in excellent condition.     Ginger Carne, MD  Electronically Signed    SHB/MEDQ  D:  07/29/2006  T:  07/29/2006  Job:  [id number]

## 2010-07-08 ENCOUNTER — Encounter: Payer: Medicaid Other | Admitting: Obstetrics and Gynecology

## 2010-08-06 ENCOUNTER — Emergency Department (HOSPITAL_COMMUNITY)
Admission: EM | Admit: 2010-08-06 | Discharge: 2010-08-06 | Disposition: A | Discharge status: Home / Self Care | Payer: Medicaid Other | Attending: Emergency Medicine | Admitting: Emergency Medicine

## 2010-08-06 ENCOUNTER — Emergency Department (HOSPITAL_COMMUNITY): Payer: Medicaid Other

## 2010-08-06 DIAGNOSIS — R072 Precordial pain: Secondary | ICD-10-CM | POA: Insufficient documentation

## 2010-08-06 DIAGNOSIS — J3489 Other specified disorders of nose and nasal sinuses: Secondary | ICD-10-CM | POA: Insufficient documentation

## 2010-08-06 DIAGNOSIS — J40 Bronchitis, not specified as acute or chronic: Secondary | ICD-10-CM | POA: Insufficient documentation

## 2010-08-06 DIAGNOSIS — R05 Cough: Secondary | ICD-10-CM | POA: Insufficient documentation

## 2010-08-06 DIAGNOSIS — J020 Streptococcal pharyngitis: Secondary | ICD-10-CM | POA: Insufficient documentation

## 2010-08-06 DIAGNOSIS — B1920 Unspecified viral hepatitis C without hepatic coma: Secondary | ICD-10-CM | POA: Insufficient documentation

## 2010-08-06 DIAGNOSIS — R059 Cough, unspecified: Secondary | ICD-10-CM | POA: Insufficient documentation

## 2010-08-06 LAB — POCT PREGNANCY, URINE: Preg Test, Ur: NEGATIVE

## 2010-08-30 ENCOUNTER — Inpatient Hospital Stay (HOSPITAL_COMMUNITY)
Admission: AD | Admit: 2010-08-30 | Discharge: 2010-08-31 | Discharge status: Against Medical Advice | Payer: Self-pay | Source: Ambulatory Visit | Attending: Obstetrics & Gynecology | Admitting: Obstetrics & Gynecology

## 2010-08-31 NOTE — ED Notes (Signed)
Not in lobby

## 2010-09-04 ENCOUNTER — Inpatient Hospital Stay (INDEPENDENT_AMBULATORY_CARE_PROVIDER_SITE_OTHER)
Admission: RE | Admit: 2010-09-04 | Discharge: 2010-09-04 | Disposition: A | Discharge status: Home / Self Care | Payer: Self-pay | Source: Ambulatory Visit | Attending: Family Medicine | Admitting: Family Medicine

## 2010-09-04 DIAGNOSIS — N390 Urinary tract infection, site not specified: Secondary | ICD-10-CM

## 2010-09-04 LAB — POCT URINALYSIS DIP (DEVICE)
Ketones, ur: NEGATIVE mg/dL
Protein, ur: NEGATIVE mg/dL
Urobilinogen, UA: 0.2 mg/dL (ref 0.0–1.0)
pH: 5.5 (ref 5.0–8.0)

## 2010-09-23 ENCOUNTER — Encounter (HOSPITAL_COMMUNITY): Payer: Self-pay | Admitting: *Deleted

## 2010-09-23 ENCOUNTER — Inpatient Hospital Stay (HOSPITAL_COMMUNITY)
Admission: AD | Admit: 2010-09-23 | Discharge: 2010-09-23 | Disposition: A | Discharge status: Home / Self Care | Payer: Self-pay | Source: Ambulatory Visit | Attending: Obstetrics & Gynecology | Admitting: Obstetrics & Gynecology

## 2010-09-23 ENCOUNTER — Inpatient Hospital Stay (HOSPITAL_COMMUNITY)
Admission: RE | Admit: 2010-09-23 | Discharge: 2010-09-23 | Disposition: A | Discharge status: Home / Self Care | Payer: Self-pay | Source: Ambulatory Visit | Attending: Family Medicine | Admitting: Family Medicine

## 2010-09-23 DIAGNOSIS — H6690 Otitis media, unspecified, unspecified ear: Secondary | ICD-10-CM | POA: Insufficient documentation

## 2010-09-23 DIAGNOSIS — Z3201 Encounter for pregnancy test, result positive: Secondary | ICD-10-CM

## 2010-09-23 DIAGNOSIS — O99891 Other specified diseases and conditions complicating pregnancy: Secondary | ICD-10-CM | POA: Insufficient documentation

## 2010-09-23 DIAGNOSIS — H6691 Otitis media, unspecified, right ear: Secondary | ICD-10-CM | POA: Diagnosis present

## 2010-09-23 DIAGNOSIS — J069 Acute upper respiratory infection, unspecified: Secondary | ICD-10-CM | POA: Diagnosis present

## 2010-09-23 LAB — URINALYSIS, ROUTINE W REFLEX MICROSCOPIC
Bilirubin Urine: NEGATIVE
Hgb urine dipstick: NEGATIVE
Ketones, ur: NEGATIVE mg/dL
Specific Gravity, Urine: 1.01 (ref 1.005–1.030)
Urobilinogen, UA: 1 mg/dL (ref 0.0–1.0)

## 2010-09-23 MED ORDER — AMOXICILLIN 500 MG PO CAPS: 500.0000 mg | ORAL_CAPSULE | Freq: Two times a day (BID) | ORAL | Status: AC

## 2010-09-23 MED ORDER — GUAIFENESIN ER 600 MG PO TB12: 1200.0000 mg | ORAL_TABLET | Freq: Two times a day (BID) | ORAL | Status: DC

## 2010-09-23 MED ORDER — PRENATE ELITE 90-600-400 MG-MCG-MCG PO TABS: 1.0000 | ORAL_TABLET | Freq: Every day | ORAL | Status: DC

## 2010-09-23 NOTE — Progress Notes (Signed)
Pt states she states she wanted to makes sure everything is OK and "plus I have a ear infection that i didn't have to come here for but since I am here I wanted to get everything checked

## 2010-09-23 NOTE — Progress Notes (Signed)
Pt states she has an ear infectin on left side,has headaches with nose stuffiness and had a + home UPT

## 2010-09-23 NOTE — ED Provider Notes (Signed)
History     Chief Complaint  Patient presents with  . Headache   HPI 30 y.o. [id number] with + UPT at home. States she has a "sensitive feeling" in low abdomen, but nothing that she would describe as pain or cramping. No bleeding. LMP 08/19/10. Seen at Urgent Care earlier today for "ear infection", but didn't wait to be seen. Left ear pain, nasal congestion and headache x 1 week. Finished course of septra about 2 weeks ago for UTI. Took 1 amoxicillin last night to try to treat for ear infection.    OB History    Grav Para Term Preterm Abortions TAB SAB Ect Mult Living   7 5 3 2 1  0 1 0 0 5      No past medical history on file.  Past Surgical History  Procedure Date  . Cesarean section   . Dilation and curettage of uterus     Family History  Problem Relation Age of Onset  . Hypertension Mother   . Hypertension Maternal Grandmother   . Stroke Maternal Grandfather     History  Substance Use Topics  . Smoking status: Current Everyday Smoker -- 1.0 packs/day  . Smokeless tobacco: Never Used  . Alcohol Use: Yes    Allergies: No Known Allergies  Prescriptions prior to admission  Medication Sig Dispense Refill  . acetaminophen (TYLENOL) 500 MG tablet Take 500 mg by mouth every 6 (six) hours as needed. For pain       . amoxicillin (AMOXIL) 500 MG capsule Take 500 mg by mouth 3 (three) times daily.        Marland Kitchen sulfamethoxazole-trimethoprim (BACTRIM DS,SEPTRA DS) 800-160 MG per tablet Take 1 tablet by mouth 2 (two) times daily.          Review of Systems  Constitutional: Negative.   HENT: Positive for ear pain and congestion. Negative for hearing loss, nosebleeds and sore throat.   Eyes: Negative.   Respiratory: Negative.  Negative for cough and stridor.   Cardiovascular: Negative.   Gastrointestinal: Negative.  Negative for nausea, vomiting and abdominal pain.  Genitourinary: Negative.   Musculoskeletal: Negative.   Neurological: Positive for headaches.    Psychiatric/Behavioral: Negative.    Physical Exam   Blood pressure 128/78, pulse 73, temperature 98.9 F (37.2 C), temperature source Oral, resp. rate 20, height 5\' 6"  (1.676 m), weight 65.318 kg (144 lb), last menstrual period 08/19/2010.  Physical Exam  Constitutional: She is oriented to person, place, and time. She appears well-developed and well-nourished. No distress.  HENT:  Head: Normocephalic and atraumatic.  Right Ear: External ear normal. No mastoid tenderness. Tympanic membrane is erythematous. Tympanic membrane is not perforated.  Left Ear: External ear normal. No mastoid tenderness. A middle ear effusion is present.  Nose: Rhinorrhea present.  Mouth/Throat: Oropharynx is clear and moist.  Cardiovascular: Normal rate.   Respiratory: Effort normal.  Musculoskeletal: Normal range of motion.  Neurological: She is alert and oriented to person, place, and time.  Skin: Skin is warm and dry.  Psychiatric: She has a normal mood and affect.    MAU Course  Procedures  Results for orders placed during the hospital encounter of 09/23/10 (from the past 24 hour(s))  URINALYSIS, ROUTINE W REFLEX MICROSCOPIC     Status: Normal   Collection Time   09/23/10  9:55 PM      Component Value Range   Color, Urine YELLOW  YELLOW    Appearance CLEAR  CLEAR    Specific Gravity, Urine  1.010  1.005 - 1.030    pH 6.5  5.0 - 8.0    Glucose, UA NEGATIVE  NEGATIVE (mg/dL)   Hgb urine dipstick NEGATIVE  NEGATIVE    Bilirubin Urine NEGATIVE  NEGATIVE    Ketones, ur NEGATIVE  NEGATIVE (mg/dL)   Protein, ur NEGATIVE  NEGATIVE (mg/dL)   Urobilinogen, UA 1.0  0.0 - 1.0 (mg/dL)   Nitrite NEGATIVE  NEGATIVE    Leukocytes, UA NEGATIVE  NEGATIVE   POCT PREGNANCY, URINE     Status: Normal   Collection Time   09/23/10 10:00 PM      Component Value Range   Preg Test, Ur POSITIVE       Assessment and Plan  30 y.o. [id number] at [redacted]w[redacted]d URI and right otitis media - rx amoxicillin, mucinex for  congestion Rev'd early pregnancy precautions Pregnancy verification letter given, start prenatal care asap  [facility] 09/23/2010, 11:10 PM

## 2010-10-23 ENCOUNTER — Inpatient Hospital Stay (HOSPITAL_COMMUNITY)
Admission: RE | Admit: 2010-10-23 | Discharge: 2010-10-23 | Disposition: A | Discharge status: Home / Self Care | Payer: BC Managed Care – PPO | Source: Ambulatory Visit | Attending: Family Medicine | Admitting: Family Medicine

## 2010-10-24 ENCOUNTER — Inpatient Hospital Stay (INDEPENDENT_AMBULATORY_CARE_PROVIDER_SITE_OTHER)
Admission: RE | Admit: 2010-10-24 | Discharge: 2010-10-24 | Disposition: A | Discharge status: Home / Self Care | Payer: BC Managed Care – PPO | Source: Ambulatory Visit | Attending: Emergency Medicine | Admitting: Emergency Medicine

## 2010-10-24 DIAGNOSIS — H60399 Other infective otitis externa, unspecified ear: Secondary | ICD-10-CM

## 2010-11-02 LAB — POCT PREGNANCY, URINE: Operator id: 247071

## 2010-11-02 LAB — POCT URINALYSIS DIP (DEVICE)
Bilirubin Urine: NEGATIVE
Glucose, UA: NEGATIVE
Ketones, ur: NEGATIVE
Nitrite: NEGATIVE
pH: 7.5

## 2010-11-09 LAB — URINALYSIS, ROUTINE W REFLEX MICROSCOPIC
Glucose, UA: NEGATIVE
Ketones, ur: NEGATIVE
Nitrite: NEGATIVE
Protein, ur: NEGATIVE
Urobilinogen, UA: 0.2

## 2010-11-10 LAB — URINE MICROSCOPIC-ADD ON

## 2010-11-10 LAB — URINALYSIS, ROUTINE W REFLEX MICROSCOPIC
Glucose, UA: NEGATIVE
Leukocytes, UA: NEGATIVE
Protein, ur: NEGATIVE
Specific Gravity, Urine: 1.015

## 2010-11-10 LAB — CBC
Hemoglobin: 10.4 — ABNORMAL LOW
MCHC: 34.2
Platelets: 220
RBC: 3.11 — ABNORMAL LOW
RDW: 13.6
WBC: 10.4
WBC: 9.6

## 2010-11-10 LAB — RPR: RPR Ser Ql: NONREACTIVE

## 2010-11-11 LAB — URINALYSIS, ROUTINE W REFLEX MICROSCOPIC
Bilirubin Urine: NEGATIVE
Hgb urine dipstick: NEGATIVE
Nitrite: NEGATIVE
Protein, ur: NEGATIVE
Urobilinogen, UA: 0.2

## 2010-11-11 LAB — POCT URINALYSIS DIP (DEVICE)
Bilirubin Urine: NEGATIVE
Bilirubin Urine: NEGATIVE
Glucose, UA: NEGATIVE
Glucose, UA: NEGATIVE
Hgb urine dipstick: NEGATIVE
Hgb urine dipstick: NEGATIVE
Ketones, ur: NEGATIVE
Ketones, ur: NEGATIVE
Nitrite: NEGATIVE
Nitrite: NEGATIVE
Operator id: 120861
Operator id: 148111
Protein, ur: 30 — AB
Protein, ur: NEGATIVE
Specific Gravity, Urine: 1.01
Specific Gravity, Urine: >=1.03
Urobilinogen, UA: 0.2
Urobilinogen, UA: 0.2
pH: 5.5
pH: 7

## 2010-11-12 LAB — POCT URINALYSIS DIP (DEVICE)
Bilirubin Urine: NEGATIVE
Glucose, UA: NEGATIVE
Hgb urine dipstick: NEGATIVE
Ketones, ur: NEGATIVE
Nitrite: NEGATIVE
Operator id: 120861
Specific Gravity, Urine: 1.025
Urobilinogen, UA: 0.2
pH: 6

## 2010-11-13 LAB — RPR: RPR: NONREACTIVE

## 2010-11-13 LAB — HIV ANTIBODY (ROUTINE TESTING W REFLEX): HIV: NONREACTIVE

## 2010-11-13 LAB — GC/CHLAMYDIA PROBE AMP, GENITAL: Gonorrhea: NEGATIVE

## 2011-01-26 HISTORY — DX: Maternal care for unspecified type scar from previous cesarean delivery: O34.219

## 2011-01-26 NOTE — L&D Delivery Note (Signed)
Delivery Note At 11:12 PM a viable female was delivered via Vaginal, Spontaneous Delivery (Presentation:OA ; LOT  ).  APGAR: , ; weight 6 lb 4 oz (2835 g).   Placenta status: delivered , intact .  Cord:  with the following complications: none .    Anesthesia: Epidural  Episiotomy: none Lacerations: None Suture Repair: N/A Est. Blood Loss (mL): 500cc  Mom to postpartum.  Baby to nursery-stable.  BOVARD,Chamia Schmutz 05/19/2011, 11:27 PM  Bo/O+/ wants BTL  D/W pt R/B/A of PPBTL, pt declines Essure, desires PP tubal, d/w pt r/b/a/ including bleeding, infection and damage to surrounding organs, also risk of ectopic pregnancy with failure; pt voices understanding wishe to proceed  Also d/w pt circumcision for female infant, including r/b/a, will have at office

## 2011-04-15 ENCOUNTER — Inpatient Hospital Stay (HOSPITAL_COMMUNITY)
Admission: AD | Admit: 2011-04-15 | Discharge: 2011-04-16 | Disposition: A | Discharge status: Home / Self Care | Payer: BC Managed Care – PPO | Source: Ambulatory Visit | Attending: Obstetrics and Gynecology | Admitting: Obstetrics and Gynecology

## 2011-04-15 DIAGNOSIS — O4700 False labor before 37 completed weeks of gestation, unspecified trimester: Secondary | ICD-10-CM

## 2011-04-15 HISTORY — DX: Other specified health status: Z78.9

## 2011-04-15 NOTE — MAU Note (Signed)
Pt G7 P5 at 34.1wks, having contractions, nausea and pressure x 2 nights.  Hx PTL, PTD at 35wks for PIH.  Prev C/S x 1.  Plans for vag delivery.

## 2011-04-16 ENCOUNTER — Encounter (HOSPITAL_COMMUNITY): Payer: Self-pay | Admitting: *Deleted

## 2011-04-16 LAB — URINALYSIS, ROUTINE W REFLEX MICROSCOPIC
Bilirubin Urine: NEGATIVE
Glucose, UA: NEGATIVE mg/dL
Ketones, ur: 40 mg/dL — AB
Protein, ur: NEGATIVE mg/dL

## 2011-04-16 LAB — URINE MICROSCOPIC-ADD ON

## 2011-04-16 NOTE — MAU Note (Signed)
Pt states she is having some contractions and pressure and nausea. Pt states she feels burning.in her vaginal area,'not just when i use the bathroom'

## 2011-04-16 NOTE — MAU Provider Note (Signed)
Erica [name] is a 31 y.o. year old [id number] female at [redacted]w[redacted]d weeks gestation who presents to MAU reporting preterm labor.  Maternal Medical History:  Reason for admission: Reason for admission: contractions.  Reason for Admission:   nauseaContractions: Onset was 1-2 hours ago.   Frequency: irregular.   Perceived severity is mild.    Fetal activity: Perceived fetal activity is normal.      OB History    Grav Para Term Preterm Abortions TAB SAB Ect Mult Living   7 5 3 2 1  0 1 0 0 5     Past Medical History  Diagnosis Date  . No pertinent past medical history    Past Surgical History  Procedure Date  . Cesarean section   . Dilation and curettage of uterus    Family History: family history includes Hypertension in her maternal grandmother and mother and Stroke in her maternal grandfather. Social History:  reports that she has been smoking.  She has never used smokeless tobacco. She reports that she drinks alcohol. She reports that she does not use illicit drugs.  Review of Systems  Constitutional: Negative for fever and chills.  Gastrointestinal: Positive for abdominal pain (mild cramping). Negative for nausea and vomiting.  Genitourinary: Negative for dysuria.      Blood pressure 132/79, pulse 98, temperature 98.1 F (36.7 C), temperature source Oral, resp. rate 18, height 5' 3.5" (1.613 m), weight 74.934 kg (165 lb 3.2 oz), last menstrual period 08/19/2010. Maternal Exam:  Uterine Assessment: Contraction strength is mild.  Contraction frequency is irregular.  Q2-7  Abdomen: Fetal presentation: vertex  Introitus: Normal vulva. Normal vagina.  Pelvis: adequate for delivery.   Cervix: Cervix evaluated by sterile speculum exam and digital exam.     Fetal Exam Fetal Monitor Review: Mode: ultrasound.   Baseline rate: 120-130'a.  Variability: moderate (6-25 bpm).   Pattern: accelerations present and no decelerations.    Fetal State Assessment: Category I - tracings are  normal.     Physical Exam  Nursing note and vitals reviewed. Constitutional: She is oriented to person, place, and time. She appears well-developed and well-nourished. No distress.  Cardiovascular: Normal rate.   Respiratory: Effort normal.  GI: Soft. There is no tenderness.  Genitourinary: Vagina normal and uterus normal.  Neurological: She is alert and oriented to person, place, and time.  Skin: Skin is warm and dry.  Psychiatric: She has a normal mood and affect.   Dilation: Closed Effacement (%): Thick Cervical Position: Posterior Station: -3;Ballotable Presentation: Undeterminable  Prenatal labs: ABO, Rh:   Antibody:   Rubella:   RPR:    HBsAg:    HIV:    GBS:     Discussed findings w/ Dr. Ambrose Mantle. Instructed to recheck cervix in 1 hour. D/C home if no change.  0210: Pt not in room. Left AMA w/out notifying staff. UC's less frequent per toco  Assessment/Plan: 1. Preterm uterine contractions, antepartum    Plan: Pt left AMA F/U w/ Dr. Ambrose Mantle as scheduled or MAU PRN for worsening Sx.  Dorathy Kinsman 04/16/2011, 2:48 AM

## 2011-04-16 NOTE — MAU Note (Signed)
Pt left the room with out being discharged. Pt last appeared to be going to the bathroom

## 2011-04-26 LAB — STREP B DNA PROBE: GBS: NEGATIVE

## 2011-05-10 ENCOUNTER — Inpatient Hospital Stay (HOSPITAL_COMMUNITY)
Admission: AD | Admit: 2011-05-10 | Discharge: 2011-05-11 | Disposition: A | Discharge status: Home / Self Care | Payer: BC Managed Care – PPO | Source: Ambulatory Visit | Attending: Obstetrics and Gynecology | Admitting: Obstetrics and Gynecology

## 2011-05-10 DIAGNOSIS — O368190 Decreased fetal movements, unspecified trimester, not applicable or unspecified: Secondary | ICD-10-CM | POA: Insufficient documentation

## 2011-05-10 DIAGNOSIS — Z3689 Encounter for other specified antenatal screening: Secondary | ICD-10-CM

## 2011-05-10 DIAGNOSIS — Z36 Encounter for antenatal screening of mother: Secondary | ICD-10-CM

## 2011-05-10 NOTE — MAU Note (Signed)
Pt reports decreased fetal movement all day today, has had decreased fetal movement  Throughout preg and has weekly NST's in the office.

## 2011-05-11 ENCOUNTER — Encounter (HOSPITAL_COMMUNITY): Payer: Self-pay | Admitting: *Deleted

## 2011-05-11 NOTE — MAU Provider Note (Signed)
  History     [name]: [id number]  Arrival date and time: 05/10/11 2332   First Provider Initiated Contact with Patient 05/11/11 0017      Chief Complaint  Patient presents with  . Decreased Fetal Movement   HPI 31 y.o. [id number] at [redacted]w[redacted]d with decreased fetal movement all day today. Feeling good movement since arrival to MAU. No pain or bleeding.    Past Medical History  Diagnosis Date  . No pertinent past medical history     Past Surgical History  Procedure Date  . Cesarean section   . Dilation and curettage of uterus     Family History  Problem Relation Age of Onset  . Hypertension Mother   . Hypertension Maternal Grandmother   . Stroke Maternal Grandfather     History  Substance Use Topics  . Smoking status: Current Everyday Smoker -- 1.0 packs/day  . Smokeless tobacco: Never Used  . Alcohol Use: Yes    Allergies: No Known Allergies  Prescriptions prior to admission  Medication Sig Dispense Refill  . acetaminophen (TYLENOL) 500 MG tablet Take 500 mg by mouth every 6 (six) hours as needed. For pain       . calcium carbonate (TUMS - DOSED IN MG ELEMENTAL CALCIUM) 500 MG chewable tablet Chew 1 tablet by mouth daily as needed. For heartburn      . Prenatal Vit-Fe Fumarate-FA (PRENATAL MULTIVITAMIN) TABS Take 1 tablet by mouth every morning.        Review of Systems  Constitutional: Negative.   Respiratory: Negative.   Cardiovascular: Negative.   Gastrointestinal: Positive for nausea and vomiting (intermittment, no n/v now). Negative for abdominal pain, diarrhea and constipation.  Genitourinary: Negative for dysuria, urgency, frequency, hematuria and flank pain.       Negative for vaginal bleeding, cramping/contractions  Musculoskeletal: Negative.   Neurological: Positive for headaches (intermittment, relieved with tylenol).  Psychiatric/Behavioral: Negative.    Physical Exam   Blood pressure 125/73, pulse 97, temperature 98.2 F (36.8 C), temperature source  Oral, resp. rate 18, height 5' 3.5" (1.613 m), weight 166 lb (75.297 kg), last menstrual period 08/19/2010, SpO2 99.00%.  Physical Exam  Nursing note and vitals reviewed. Constitutional: She is oriented to person, place, and time. She appears well-developed and well-nourished. No distress.  Cardiovascular: Normal rate.   Respiratory: Effort normal.  GI: Soft. There is no tenderness.  Musculoskeletal: Normal range of motion.  Neurological: She is alert and oriented to person, place, and time.  Skin: Skin is warm and dry.  Psychiatric: She has a normal mood and affect.   EFM: reactive, TOCO: quiet MAU Course  Procedures    Assessment and Plan  31 y.o. [id number] at [redacted]w[redacted]d Decreased fetal movement - reactive NST F/U as scheduled, precautions rev'd  Kamran Coker 05/11/2011, 12:18 AM

## 2011-05-12 ENCOUNTER — Telehealth (HOSPITAL_COMMUNITY): Payer: Self-pay | Admitting: *Deleted

## 2011-05-12 ENCOUNTER — Encounter (HOSPITAL_COMMUNITY): Payer: Self-pay | Admitting: *Deleted

## 2011-05-14 ENCOUNTER — Telehealth (HOSPITAL_COMMUNITY): Payer: Self-pay | Admitting: *Deleted

## 2011-05-14 ENCOUNTER — Encounter (HOSPITAL_COMMUNITY): Payer: Self-pay | Admitting: *Deleted

## 2011-05-14 NOTE — Telephone Encounter (Signed)
Preadmission screen  

## 2011-05-16 ENCOUNTER — Encounter (HOSPITAL_COMMUNITY): Payer: Self-pay | Admitting: Obstetrics and Gynecology

## 2011-05-16 ENCOUNTER — Inpatient Hospital Stay (HOSPITAL_COMMUNITY)
Admission: AD | Admit: 2011-05-16 | Discharge: 2011-05-16 | Disposition: A | Discharge status: Home / Self Care | Payer: BC Managed Care – PPO | Source: Ambulatory Visit | Attending: Obstetrics and Gynecology | Admitting: Obstetrics and Gynecology

## 2011-05-16 DIAGNOSIS — O99891 Other specified diseases and conditions complicating pregnancy: Secondary | ICD-10-CM | POA: Insufficient documentation

## 2011-05-16 LAB — POCT FERN TEST: Fern Test: NEGATIVE

## 2011-05-16 NOTE — MAU Note (Signed)
Pt presents to MAU with chief complaint of rupture of membranes. Pt is [redacted]w[redacted]d; G7P5 Prior c-section desires TOL. Pt says rupture occurred last night and continued to leak all night.

## 2011-05-16 NOTE — Progress Notes (Signed)
Dr. Senaida Ores notified of patient arrival for labor evaluation/ r/o rupture. Dr. Senaida Ores notified of neg. Fern. Orders received to discharge patient home.

## 2011-05-19 ENCOUNTER — Inpatient Hospital Stay (HOSPITAL_COMMUNITY)
Admission: AD | Admit: 2011-05-19 | Discharge: 2011-05-21 | DRG: 373 | Disposition: A | Discharge status: Home / Self Care | Payer: BC Managed Care – PPO | Source: Ambulatory Visit | Attending: Obstetrics and Gynecology | Admitting: Obstetrics and Gynecology

## 2011-05-19 ENCOUNTER — Inpatient Hospital Stay (HOSPITAL_COMMUNITY): Payer: BC Managed Care – PPO | Admitting: Anesthesiology

## 2011-05-19 ENCOUNTER — Encounter (HOSPITAL_COMMUNITY): Payer: Self-pay | Admitting: *Deleted

## 2011-05-19 ENCOUNTER — Inpatient Hospital Stay (HOSPITAL_COMMUNITY)
Admission: AD | Admit: 2011-05-19 | Discharge: 2011-05-19 | Disposition: A | Discharge status: Home / Self Care | Payer: BC Managed Care – PPO | Source: Ambulatory Visit | Attending: Obstetrics and Gynecology | Admitting: Obstetrics and Gynecology

## 2011-05-19 ENCOUNTER — Encounter (HOSPITAL_COMMUNITY): Payer: Self-pay | Admitting: Anesthesiology

## 2011-05-19 ENCOUNTER — Encounter (HOSPITAL_COMMUNITY): Payer: Self-pay | Admitting: Obstetrics and Gynecology

## 2011-05-19 ENCOUNTER — Encounter (HOSPITAL_COMMUNITY): Payer: Self-pay

## 2011-05-19 DIAGNOSIS — O479 False labor, unspecified: Secondary | ICD-10-CM | POA: Insufficient documentation

## 2011-05-19 DIAGNOSIS — O34219 Maternal care for unspecified type scar from previous cesarean delivery: Principal | ICD-10-CM

## 2011-05-19 DIAGNOSIS — Z98891 History of uterine scar from previous surgery: Secondary | ICD-10-CM

## 2011-05-19 DIAGNOSIS — O4700 False labor before 37 completed weeks of gestation, unspecified trimester: Secondary | ICD-10-CM

## 2011-05-19 LAB — CBC
MCH: 32.4 pg (ref 26.0–34.0)
MCV: 96.2 fL (ref 78.0–100.0)
Platelets: 208 10*3/uL (ref 150–400)
RBC: 3.95 MIL/uL (ref 3.87–5.11)
RDW: 14.4 % (ref 11.5–15.5)

## 2011-05-19 MED ORDER — ACETAMINOPHEN 325 MG PO TABS: 650.0000 mg | ORAL_TABLET | ORAL | Status: DC | PRN

## 2011-05-19 MED ORDER — LACTATED RINGERS IV SOLN
INTRAVENOUS | Status: DC
  Administered 2011-05-19: 19:00:00 via INTRAVENOUS

## 2011-05-19 MED ORDER — IBUPROFEN 600 MG PO TABS
600.0000 mg | ORAL_TABLET | Freq: Four times a day (QID) | ORAL | Status: DC | PRN
  Filled 2011-05-19: qty 1

## 2011-05-19 MED ORDER — PHENYLEPHRINE 40 MCG/ML (10ML) SYRINGE FOR IV PUSH (FOR BLOOD PRESSURE SUPPORT)
80.0000 ug | PREFILLED_SYRINGE | INTRAVENOUS | Status: DC | PRN
  Filled 2011-05-19: qty 5

## 2011-05-19 MED ORDER — OXYTOCIN 20 UNITS IN LACTATED RINGERS INFUSION - SIMPLE
125.0000 mL/h | Freq: Once | INTRAVENOUS | Status: AC
  Administered 2011-05-19: 999 mL/h via INTRAVENOUS

## 2011-05-19 MED ORDER — PHENYLEPHRINE 40 MCG/ML (10ML) SYRINGE FOR IV PUSH (FOR BLOOD PRESSURE SUPPORT): 80.0000 ug | PREFILLED_SYRINGE | INTRAVENOUS | Status: DC | PRN

## 2011-05-19 MED ORDER — BUTORPHANOL TARTRATE 2 MG/ML IJ SOLN: 2.0000 mg | Freq: Once | INTRAMUSCULAR | Status: AC | PRN

## 2011-05-19 MED ORDER — EPHEDRINE 5 MG/ML INJ
10.0000 mg | INTRAVENOUS | Status: DC | PRN
  Filled 2011-05-19: qty 4

## 2011-05-19 MED ORDER — ONDANSETRON HCL 4 MG/2ML IJ SOLN: 4.0000 mg | Freq: Four times a day (QID) | INTRAMUSCULAR | Status: DC | PRN

## 2011-05-19 MED ORDER — OXYCODONE-ACETAMINOPHEN 5-325 MG PO TABS: 1.0000 | ORAL_TABLET | ORAL | Status: DC | PRN

## 2011-05-19 MED ORDER — LACTATED RINGERS IV SOLN: 500.0000 mL | Freq: Once | INTRAVENOUS | Status: DC

## 2011-05-19 MED ORDER — LIDOCAINE HCL (PF) 1 % IJ SOLN
INTRAMUSCULAR | Status: DC | PRN
  Administered 2011-05-19 (×2): 8 mL

## 2011-05-19 MED ORDER — DIPHENHYDRAMINE HCL 50 MG/ML IJ SOLN: 12.5000 mg | INTRAMUSCULAR | Status: DC | PRN

## 2011-05-19 MED ORDER — LIDOCAINE HCL (PF) 1 % IJ SOLN
30.0000 mL | INTRAMUSCULAR | Status: DC | PRN
  Filled 2011-05-19: qty 30

## 2011-05-19 MED ORDER — OXYTOCIN 20 UNITS IN LACTATED RINGERS INFUSION - SIMPLE
1.0000 m[IU]/min | INTRAVENOUS | Status: DC
  Administered 2011-05-19: 2 m[IU]/min via INTRAVENOUS
  Filled 2011-05-19: qty 1000

## 2011-05-19 MED ORDER — TERBUTALINE SULFATE 1 MG/ML IJ SOLN: 0.2500 mg | Freq: Once | INTRAMUSCULAR | Status: AC | PRN

## 2011-05-19 MED ORDER — FENTANYL 2.5 MCG/ML BUPIVACAINE 1/10 % EPIDURAL INFUSION (WH - ANES)
14.0000 mL/h | INTRAMUSCULAR | Status: DC
  Administered 2011-05-19: 14 mL/h via EPIDURAL
  Filled 2011-05-19 (×2): qty 60

## 2011-05-19 MED ORDER — IBUPROFEN 600 MG PO TABS
600.0000 mg | ORAL_TABLET | Freq: Four times a day (QID) | ORAL | Status: DC
  Administered 2011-05-20 – 2011-05-21 (×7): 600 mg via ORAL
  Filled 2011-05-19 (×5): qty 1

## 2011-05-19 MED ORDER — FENTANYL 2.5 MCG/ML BUPIVACAINE 1/10 % EPIDURAL INFUSION (WH - ANES)
INTRAMUSCULAR | Status: DC | PRN
  Administered 2011-05-19: 14 mL/h via EPIDURAL

## 2011-05-19 MED ORDER — CITRIC ACID-SODIUM CITRATE 334-500 MG/5ML PO SOLN: 30.0000 mL | ORAL | Status: DC | PRN

## 2011-05-19 MED ORDER — EPHEDRINE 5 MG/ML INJ: 10.0000 mg | INTRAVENOUS | Status: DC | PRN

## 2011-05-19 MED ORDER — LACTATED RINGERS IV SOLN: 500.0000 mL | INTRAVENOUS | Status: DC | PRN

## 2011-05-19 MED ORDER — FLEET ENEMA 7-19 GM/118ML RE ENEM: 1.0000 | ENEMA | RECTAL | Status: DC | PRN

## 2011-05-19 MED ORDER — OXYTOCIN BOLUS FROM INFUSION
500.0000 mL | Freq: Once | INTRAVENOUS | Status: DC
  Filled 2011-05-19: qty 500

## 2011-05-19 NOTE — Anesthesia Preprocedure Evaluation (Signed)
Anesthesia Evaluation  Patient identified by MRN, date of birth, ID band Patient awake    Reviewed: Allergy & Precautions, H&P , NPO status , Patient's Chart, lab work & pertinent test results  Airway Mallampati: II TM Distance: >3 FB Neck ROM: full    Dental No notable dental hx.    Pulmonary neg pulmonary ROS,    Pulmonary exam normal       Cardiovascular hypertension,     Neuro/Psych PSYCHIATRIC DISORDERS Anxiety Depression    GI/Hepatic negative GI ROS, Neg liver ROS,   Endo/Other  negative endocrine ROS  Renal/GU negative Renal ROS  negative genitourinary   Musculoskeletal negative musculoskeletal ROS (+)   Abdominal Normal abdominal exam  (+)   Peds negative pediatric ROS (+)  Hematology negative hematology ROS (+)   Anesthesia Other Findings   Reproductive/Obstetrics (+) Pregnancy                           Anesthesia Physical Anesthesia Plan  ASA: II  Anesthesia Plan: Epidural   Post-op Pain Management:    Induction:   Airway Management Planned:   Additional Equipment:   Intra-op Plan:   Post-operative Plan:   Informed Consent: I have reviewed the patients History and Physical, chart, labs and discussed the procedure including the risks, benefits and alternatives for the proposed anesthesia with the patient or authorized representative who has indicated his/her understanding and acceptance.     Plan Discussed with:   Anesthesia Plan Comments:         Anesthesia Quick Evaluation

## 2011-05-19 NOTE — Progress Notes (Signed)
Erica [name] is a 31 y.o. [id number] at [redacted]w[redacted]d  admitted for active labor  Subjective: Comfortable with epidural  Objective: BP 113/66  Pulse 83  Temp(Src) 98.1 F (36.7 C) (Oral)  Resp 18  Ht 5' 3.5" (1.613 m)  Wt 73.483 kg (162 lb)  BMI 28.25 kg/m2  SpO2 99%  LMP 08/19/2010      FHT:  FHR: 135-140 bpm, variability: moderate,  accelerations:  Present,  decelerations:  Absent UC:   regular, every 2 minutes SVE:   Dilation: 6.7 Effacement (%): 90 Station: 0/+1 Exam by:: Dr. Ellyn Hack  Labs: Lab Results  Component Value Date   WBC 17.3* 05/19/2011   HGB 12.8 05/19/2011   HCT 38.0 05/19/2011   MCV 96.2 05/19/2011   PLT 208 05/19/2011    Assessment / Plan: Spontaneous labor, progressing normally  Labor: Progressing normally  AROM performed w/o diff/comp Preeclampsia:  no signs or symptoms of toxicity Fetal Wellbeing:  Category I Pain Control:  Epidural I/D:  n/a Anticipated MOD:  NSVD  BOVARD,Permelia Bamba 05/19/2011, 10:12 PM

## 2011-05-19 NOTE — Discharge Instructions (Signed)
Keep your scheduled appointment for prenatal care. Call the office or provider on call with further concerns or return to MAU as necessary.

## 2011-05-19 NOTE — Telephone Encounter (Signed)
Preadmission screen  

## 2011-05-19 NOTE — Progress Notes (Signed)
Dr. Jackelyn Knife notified of pt presenting for labor check.  Notified of VE and ctx pattern.  Notified of 4 prev vag deliveries and 1 c/s.  Pt planning to vbac.  Notified of nonreactive nst at this point but moderate variability noted.  Orders received to monitor for 1 hour and recheck cervix.

## 2011-05-19 NOTE — MAU Note (Signed)
Pt states was here this am, left around noon, here again for increased pain with u/c's, rates pain 10/10 with ctx's. Denies lof, does note bloody show.

## 2011-05-19 NOTE — H&P (Signed)
Erica [name] is a 31 y.o. female [id number] at 20 wk with onset of labor.  Evaluated earlier in day with questionable cervical change, Returned with definite cervical change.  +FM, no LOF, no VB, ctx increasing in intensity and frequency.  H/O IUGR infant, requiring LTCS for delivery secondary to fetal stress. G1-G4 MAP. Maternal Medical History:  Reason for admission: Reason for admission: contractions.  Contractions: Onset was 2 days ago.   Frequency: regular.   Perceived severity is moderate.    Fetal activity: Perceived fetal activity is normal.      OB History    Grav Para Term Preterm Abortions TAB SAB Ect Mult Living   7 5 3 2 1  0 1 0 0 5    G1 SVD female 8#12, G2 female 7#7, G3 female SVD 5#10, G4 SVD female, 8#14, G5 female 98 wk LTCS female G6 SAB, G7 present; abn pap, colpo, nl f/u Past Medical History  Diagnosis Date  . No pertinent past medical history   . Anxiety     as teen  . Seizures     as a teen EEG was normal  . Abnormal Pap smear   . Pregnancy induced hypertension     with last pregnancy  . Depression     as teen   Past Surgical History  Procedure Date  . Cesarean section   . Dilation and curettage of uterus   . Colposcopy   . Wisdom tooth extraction    Family History: family history includes Arthritis in her maternal grandmother; Birth defects in her daughter; Cancer in her maternal grandmother; Hypertension in her maternal grandmother and mother; Mental illness in her brother; Mental retardation in her cousin; Seizures in her brothers, cousins, maternal aunt, and mother; and Stroke in her maternal grandfather. Social History:  reports that she has been smoking.  She has never used smokeless tobacco. She reports that she drinks alcohol. She reports that she does not use illicit drugs. CNA Meds PNV All NKDA  Review of Systems  Constitutional: Negative.   HENT: Negative.   Eyes: Negative.   Respiratory: Negative.   Cardiovascular: Negative.     Gastrointestinal: Negative.   Genitourinary: Negative.   Musculoskeletal: Negative.   Skin: Negative.   Neurological: Negative.   Psychiatric/Behavioral: Negative.     Dilation: 5.5 Effacement (%): 80 Station: -2 Exam by:: SBeck, RN Blood pressure 131/65, pulse 79, temperature 98.1 F (36.7 C), temperature source Oral, resp. rate 20, height 5' 3.5" (1.613 m), weight 73.483 kg (162 lb), last menstrual period 08/19/2010, SpO2 98.00%. Maternal Exam:  Uterine Assessment: Contraction strength is moderate.  Contraction frequency is regular.   Abdomen: Surgical scars: low transverse.   Fundal height is appropriate for gestation.   Estimated fetal weight is 6-7#.   Fetal presentation: vertex     Physical Exam  Constitutional: She is oriented to person, place, and time. She appears well-developed and well-nourished.  HENT:  Head: Normocephalic and atraumatic.  Neck: Normal range of motion. Neck supple. No thyromegaly present.  Cardiovascular: Normal rate and regular rhythm.   Respiratory: Effort normal and breath sounds normal. No respiratory distress.  GI: Soft. Bowel sounds are normal. She exhibits no distension. There is no tenderness.  Musculoskeletal: Normal range of motion.  Neurological: She is alert and oriented to person, place, and time.  Skin: Skin is warm and dry.  Psychiatric: She has a normal mood and affect. Her behavior is normal.    Prenatal labs: ABO, Rh: O/Positive/-- (10/19  0000) Antibody: Negative (10/19 0000) Rubella: Immune (10/19 0000) RPR: Nonreactive (10/19 0000)  HBsAg: Negative (10/19 0000)  HIV: Non-reactive (10/19 0000)  GBS: Negative (04/01 0000)  Hgb 12.2/ Plt 274K/ Pap WNL/ GC neg/Chl neg/ First Tri Scr and AFP WNL/ CF declined/ glucola 138 - 3hr 74/172/115/91/  [place] 9wk cwd EDC 05/26/11 Nl anat, female, post plac Nl fetal echo Nl growth after S<D, good growth, nl AFI   Assessment/Plan: 32 yo [id number] at 39 wk in active labor Epidural for  comfort D/w pt TOLAC gbbs neg, no prophylaxis Pitocin to augment prn   BOVARD,Chanette Demo 05/19/2011, 6:51 PM

## 2011-05-19 NOTE — Anesthesia Procedure Notes (Signed)
Epidural Patient location during procedure: OB Start time: 05/19/2011 6:30 PM End time: 05/19/2011 6:34 PM Reason for block: procedure for pain  Staffing Anesthesiologist: Sandrea Hughs  Preanesthetic Checklist Completed: patient identified, site marked, surgical consent, pre-op evaluation, timeout performed, IV checked, risks and benefits discussed and monitors and equipment checked  Epidural Patient position: sitting Prep: site prepped and draped and DuraPrep Patient monitoring: continuous pulse ox and blood pressure Approach: midline Injection technique: LOR air  Needle:  Needle type: Tuohy  Needle gauge: 17 G Needle length: 9 cm Needle insertion depth: 5 cm cm Catheter type: closed end flexible Catheter size: 19 Gauge Catheter at skin depth: 10 cm Test dose: negative  Assessment Sensory level: T8 Events: blood not aspirated, injection not painful, no injection resistance, negative IV test and no paresthesia

## 2011-05-20 ENCOUNTER — Encounter (HOSPITAL_COMMUNITY)
Admission: AD | Disposition: A | Discharge status: Home / Self Care | Payer: Self-pay | Source: Ambulatory Visit | Attending: Obstetrics and Gynecology

## 2011-05-20 LAB — RPR: RPR Ser Ql: NONREACTIVE

## 2011-05-20 LAB — CBC
MCV: 97.3 fL (ref 78.0–100.0)
Platelets: 188 10*3/uL (ref 150–400)
RBC: 3.35 MIL/uL — ABNORMAL LOW (ref 3.87–5.11)
WBC: 17 10*3/uL — ABNORMAL HIGH (ref 4.0–10.5)

## 2011-05-20 SURGERY — LIGATION, FALLOPIAN TUBE, POSTPARTUM
Anesthesia: Epidural | Laterality: Bilateral

## 2011-05-20 MED ORDER — BENZOCAINE-MENTHOL 20-0.5 % EX AERO: 1.0000 "application " | INHALATION_SPRAY | CUTANEOUS | Status: DC | PRN

## 2011-05-20 MED ORDER — PRENATAL MULTIVITAMIN CH
1.0000 | ORAL_TABLET | Freq: Every morning | ORAL | Status: DC
  Administered 2011-05-20 – 2011-05-21 (×2): 1 via ORAL
  Filled 2011-05-20: qty 1

## 2011-05-20 MED ORDER — SENNOSIDES-DOCUSATE SODIUM 8.6-50 MG PO TABS
2.0000 | ORAL_TABLET | Freq: Every day | ORAL | Status: DC
  Administered 2011-05-20: 2 via ORAL

## 2011-05-20 MED ORDER — ZOLPIDEM TARTRATE 5 MG PO TABS: 5.0000 mg | ORAL_TABLET | Freq: Every evening | ORAL | Status: DC | PRN

## 2011-05-20 MED ORDER — CALCIUM CARBONATE ANTACID 500 MG PO CHEW: 1.0000 | CHEWABLE_TABLET | ORAL | Status: DC | PRN

## 2011-05-20 MED ORDER — DIPHENHYDRAMINE HCL 25 MG PO CAPS: 25.0000 mg | ORAL_CAPSULE | Freq: Four times a day (QID) | ORAL | Status: DC | PRN

## 2011-05-20 MED ORDER — WITCH HAZEL-GLYCERIN EX PADS: 1.0000 "application " | MEDICATED_PAD | CUTANEOUS | Status: DC | PRN

## 2011-05-20 MED ORDER — MEDROXYPROGESTERONE ACETATE 150 MG/ML IM SUSP
150.0000 mg | Freq: Once | INTRAMUSCULAR | Status: AC
  Administered 2011-05-21: 150 mg via INTRAMUSCULAR
  Filled 2011-05-20: qty 1

## 2011-05-20 MED ORDER — DIBUCAINE 1 % RE OINT: 1.0000 "application " | TOPICAL_OINTMENT | RECTAL | Status: DC | PRN

## 2011-05-20 MED ORDER — PRENATAL MULTIVITAMIN CH
1.0000 | ORAL_TABLET | Freq: Every day | ORAL | Status: DC
  Filled 2011-05-20: qty 1

## 2011-05-20 MED ORDER — TETANUS-DIPHTH-ACELL PERTUSSIS 5-2.5-18.5 LF-MCG/0.5 IM SUSP: 0.5000 mL | Freq: Once | INTRAMUSCULAR | Status: DC

## 2011-05-20 MED ORDER — ONDANSETRON HCL 4 MG PO TABS: 4.0000 mg | ORAL_TABLET | ORAL | Status: DC | PRN

## 2011-05-20 MED ORDER — LACTATED RINGERS IV SOLN: INTRAVENOUS | Status: DC

## 2011-05-20 MED ORDER — OXYCODONE-ACETAMINOPHEN 5-325 MG PO TABS
1.0000 | ORAL_TABLET | ORAL | Status: DC | PRN
  Administered 2011-05-21 (×2): 1 via ORAL
  Filled 2011-05-20 (×2): qty 1

## 2011-05-20 MED ORDER — ONDANSETRON HCL 4 MG/2ML IJ SOLN: 4.0000 mg | INTRAMUSCULAR | Status: DC | PRN

## 2011-05-20 MED ORDER — SIMETHICONE 80 MG PO CHEW: 80.0000 mg | CHEWABLE_TABLET | ORAL | Status: DC | PRN

## 2011-05-20 MED ORDER — LANOLIN HYDROUS EX OINT: TOPICAL_OINTMENT | CUTANEOUS | Status: DC | PRN

## 2011-05-20 NOTE — Progress Notes (Signed)
Post Partum Day 1 Subjective: no complaints and voiding  Pt is very hungry.  Not sure she wants to wait until pm to do tubal and stay NPO.    Objective: Blood pressure 105/69, pulse 88, temperature 97.9 F (36.6 C), temperature source Oral, resp. rate 20, height 5' 3.5" (1.613 m), weight 73.483 kg (162 lb), last menstrual period 08/19/2010, SpO2 98.00%, unknown if currently breastfeeding.  Physical Exam:  General: alert Lochia: appropriate Uterine Fundus: firm   Basename 05/20/11 0555 05/19/11 1755  HGB 10.7* 12.8  HCT 32.6* 38.0    Assessment/Plan: Plan for discharge tomorrow Had scheduled patients tubal for 115pm, the first available they had.  D/w her again the procedure in detail and the Essure procedure.  She does not want to remain NPO all day and has decided she would like to proceed with an Essure in the office.  We will order Depo Provera before d/c as she is afraid of becoming pregnant before her procedure.  OR case canceled and regular diet ordered for patient.   LOS: 1 day   Erica [name] W 05/20/2011, 8:27 AM

## 2011-05-20 NOTE — Discharge Summary (Addendum)
Obstetric Discharge Summary Reason for Admission: onset of labor Prenatal Procedures: none Intrapartum Procedures: VBAC Postpartum Procedures: none Complications-Operative and Postpartum: none Hemoglobin  Date Value Range Status  05/20/2011 10.7* 12.0-15.0 (g/dL) Final     HCT  Date Value Range Status  05/20/2011 32.6* 36.0-46.0 (%) Final    Physical Exam:  General: alert Lochia: appropriate Uterine Fundus: firm   Discharge Diagnoses: Term Pregnancy-delivered  Discharge Information: Date: 05/20/2011 Activity: pelvic rest Diet: routine Medications: Ibuprofen Condition: improved Instructions: refer to practice specific booklet Discharge to: home   Newborn Data: Live born female  Birth Weight: 6 lb 4 oz (2835 g) APGAR: 6, 8  Home with mother.  Pt changed mind re:  ppBTL.  Received Depo Provera prior to d/c and plans office Essure procedure  in 6 weeks. Erica [name] 05/20/2011, 11:33 PM

## 2011-05-20 NOTE — Progress Notes (Signed)
Post Partum Day 1 Subjective: no complaints, up ad lib, tolerating PO and nl lochia, pain controlled; wants BTL in office  Objective: Blood pressure 105/69, pulse 88, temperature 97.9 F (36.6 C), temperature source Oral, resp. rate 20, height 5' 3.5" (1.613 m), weight 73.483 kg (162 lb), last menstrual period 08/19/2010, SpO2 98.00%, unknown if currently breastfeeding.  Physical Exam:  General: alert and no distress Lochia: appropriate Uterine Fundus: firm   Basename 05/20/11 0555 05/19/11 1755  HGB 10.7* 12.8  HCT 32.6* 38.0    Assessment/Plan: Plan for discharge tomorrow and Contraception Essure in office   LOS: 1 day   BOVARD,Nohlan Burdin 05/20/2011, 9:47 AM

## 2011-05-20 NOTE — Addendum Note (Signed)
Addendum  created 05/20/11 1103 by Armanda Heritage, RN   Modules edited:Anesthesia LDA

## 2011-05-20 NOTE — Anesthesia Postprocedure Evaluation (Signed)
  Anesthesia Post-op Note  Patient: Erica [name]  Procedure(s) Performed: Procedure(s) (LRB): POST PARTUM TUBAL LIGATION (Bilateral)  Patient Location: PACU and Mother/Baby  Anesthesia Type: Epidural  Level of Consciousness: awake, alert , oriented and patient cooperative  Airway and Oxygen Therapy: Patient Spontanous Breathing  Post-op Pain: mild  Post-op Assessment: Patient's Cardiovascular Status Stable, Respiratory Function Stable, No signs of Nausea or vomiting, Pain level controlled, No headache, No backache, No residual numbness and No residual motor weakness  Post-op Vital Signs: stable  Complications: No apparent anesthesia complications

## 2011-05-21 MED ORDER — IBUPROFEN 600 MG PO TABS: 600.0000 mg | ORAL_TABLET | Freq: Four times a day (QID) | ORAL | Status: AC
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
## 2011-05-21 NOTE — Progress Notes (Signed)
Post Partum Day 2 Subjective: no complaints, up ad lib and tolerating PO  Objective: Blood pressure 103/63, pulse 60, temperature 98 F (36.7 C), temperature source Oral, resp. rate 18, height 5' 3.5" (1.613 m), weight 73.483 kg (162 lb), last menstrual period 08/19/2010, SpO2 98.00%, unknown if currently breastfeeding.  Physical Exam:  General: alert Lochia: appropriate Uterine Fundus: firm   Basename 05/20/11 0555 05/19/11 1755  HGB 10.7* 12.8  HCT 32.6* 38.0    Assessment/Plan: Discharge home Motrin F/u 6 weeks for postpartum and 8 weeks for essure D/w pt Depo-provera, wants to go ahead with this despite possible prolonged VB   LOS: 2 days   Hollynn Garno W 05/21/2011, 9:12 AM

## 2011-05-25 ENCOUNTER — Inpatient Hospital Stay (HOSPITAL_COMMUNITY): Admission: RE | Admit: 2011-05-25 | Payer: BC Managed Care – PPO | Source: Ambulatory Visit

## 2011-07-11 ENCOUNTER — Encounter (HOSPITAL_COMMUNITY): Payer: Self-pay | Admitting: Obstetrics and Gynecology

## 2011-07-11 ENCOUNTER — Inpatient Hospital Stay (HOSPITAL_COMMUNITY)
Admission: AD | Admit: 2011-07-11 | Discharge: 2011-07-11 | Disposition: A | Discharge status: Home / Self Care | Payer: BC Managed Care – PPO | Source: Ambulatory Visit | Attending: Obstetrics and Gynecology | Admitting: Obstetrics and Gynecology

## 2011-07-11 DIAGNOSIS — E860 Dehydration: Secondary | ICD-10-CM | POA: Insufficient documentation

## 2011-07-11 DIAGNOSIS — N938 Other specified abnormal uterine and vaginal bleeding: Secondary | ICD-10-CM | POA: Insufficient documentation

## 2011-07-11 DIAGNOSIS — R51 Headache: Secondary | ICD-10-CM | POA: Insufficient documentation

## 2011-07-11 DIAGNOSIS — N949 Unspecified condition associated with female genital organs and menstrual cycle: Secondary | ICD-10-CM | POA: Insufficient documentation

## 2011-07-11 LAB — URINALYSIS, ROUTINE W REFLEX MICROSCOPIC
Bilirubin Urine: NEGATIVE
Ketones, ur: NEGATIVE mg/dL
Nitrite: NEGATIVE
Protein, ur: 30 mg/dL — AB
Urobilinogen, UA: 0.2 mg/dL (ref 0.0–1.0)

## 2011-07-11 LAB — COMPREHENSIVE METABOLIC PANEL
Albumin: 4.1 g/dL (ref 3.5–5.2)
Alkaline Phosphatase: 63 U/L (ref 39–117)
BUN: 11 mg/dL (ref 6–23)
CO2: 24 mEq/L (ref 19–32)
Chloride: 104 mEq/L (ref 96–112)
GFR calc non Af Amer: >90 mL/min (ref >90)
Potassium: 3.9 mEq/L (ref 3.5–5.1)
Total Bilirubin: 0.5 mg/dL (ref 0.3–1.2)

## 2011-07-11 LAB — CBC
HCT: 39.5 % (ref 36.0–46.0)
Hemoglobin: 13.5 g/dL (ref 12.0–15.0)
MCV: 94 fL (ref 78.0–100.0)
RBC: 4.2 MIL/uL (ref 3.87–5.11)
RDW: 13.6 % (ref 11.5–15.5)
WBC: 8.8 10*3/uL (ref 4.0–10.5)

## 2011-07-11 LAB — URINE MICROSCOPIC-ADD ON

## 2011-07-11 MED ORDER — ACETAMINOPHEN 500 MG PO TABS
1000.0000 mg | ORAL_TABLET | Freq: Once | ORAL | Status: AC
  Administered 2011-07-11: 1000 mg via ORAL
  Filled 2011-07-11: qty 2

## 2011-07-11 NOTE — Discharge Instructions (Signed)
Dehydration, Adult Dehydration is when you lose more fluids from the body than you take in. Vital organs like the kidneys, brain, and heart cannot function without a proper amount of fluids and salt. Any loss of fluids from the body can cause dehydration.  CAUSES   Vomiting.   Diarrhea.   Excessive sweating.   Excessive urine output.   Fever.  SYMPTOMS  Mild dehydration  Thirst.   Dry lips.   Slightly dry mouth.  Moderate dehydration  Very dry mouth.   Sunken eyes.   Skin does not bounce back quickly when lightly pinched and released.   Dark urine and decreased urine production.   Decreased tear production.   Headache.  Severe dehydration  Very dry mouth.   Extreme thirst.   Rapid, weak pulse (more than 100 beats per minute at rest).   Cold hands and feet.   Not able to sweat in spite of heat and temperature.   Rapid breathing.   Blue lips.   Confusion and lethargy.   Difficulty being awakened.   Minimal urine production.   No tears.  DIAGNOSIS  Your caregiver will diagnose dehydration based on your symptoms and your exam. Blood and urine tests will help confirm the diagnosis. The diagnostic evaluation should also identify the cause of dehydration. TREATMENT  Treatment of mild or moderate dehydration can often be done at home by increasing the amount of fluids that you drink. It is best to drink small amounts of fluid more often. Drinking too much at one time can make vomiting worse. Refer to the home care instructions below. Severe dehydration needs to be treated at the hospital where you will probably be given intravenous (IV) fluids that contain water and electrolytes. HOME CARE INSTRUCTIONS   Ask your caregiver about specific rehydration instructions.   Drink enough fluids to keep your urine clear or pale yellow.   Drink small amounts frequently if you have nausea and vomiting.   Eat as you normally do.   Avoid:   Foods or drinks high in  sugar.   Carbonated drinks.   Juice.   Extremely hot or cold fluids.   Drinks with caffeine.   Fatty, greasy foods.   Alcohol.   Tobacco.   Overeating.   Gelatin desserts.   Wash your hands well to avoid spreading bacteria and viruses.   Only take over-the-counter or prescription medicines for pain, discomfort, or fever as directed by your caregiver.   Ask your caregiver if you should continue all prescribed and over-the-counter medicines.   Keep all follow-up appointments with your caregiver.  SEEK MEDICAL CARE IF:  You have abdominal pain and it increases or stays in one area (localizes).   You have a rash, stiff neck, or severe headache.   You are irritable, sleepy, or difficult to awaken.   You are weak, dizzy, or extremely thirsty.  SEEK IMMEDIATE MEDICAL CARE IF:   You are unable to keep fluids down or you get worse despite treatment.   You have frequent episodes of vomiting or diarrhea.   You have blood or green matter (bile) in your vomit.   You have blood in your stool or your stool looks black and tarry.   You have not urinated in 6 to 8 hours, or you have only urinated a small amount of very dark urine.   You have a fever.   You faint.  MAKE SURE YOU:   Understand these instructions.   Will watch your condition.     Will get help right away if you are not doing well or get worse.  Document Released: 01/11/2005 Document Revised: 12/31/2010 Document Reviewed: 08/31/2010 Exit[employer] Patient Information 2012 [employer]. 

## 2011-07-11 NOTE — MAU Note (Signed)
Pt called out from the lobby, patient not in [place]

## 2011-07-11 NOTE — MAU Note (Signed)
Pt presents to MAU; post partum 7 weeks, vaginal delivery, complaining of headache, blurred vision, vaginal bleeding and increased swelling in her extremities. Pt says she was at work and saw the spots in her vision and came to MAU. Pt is a Scientist, water quality

## 2011-07-11 NOTE — MAU Provider Note (Signed)
Erica [name] 89.A.[id number] @ approx 8 wks PP Chief Complaint  Patient presents with  . Headache  . Leg Swelling     First Provider Initiated Contact with Patient 07/11/11 1824      SUBJECTIVE  HPI: She presents for several concerns, chiefly she had a dizzy spell while at work today and was seeing spots. This was associated with headache and nausea. The  headache felt like the "top of my head was coming off" but is less severe now. She had a similar dizzy spell lasting 45 minutes about a week ago. She is concern that she's been nauseated continuously since her delivery and has decreased appetite. No vomiting. States states her legs have been swollen since delivery also. She has bilateral lower back pain it has migrated from upper back. Denies dysuria, hematuria, frequency or urgency of urination. She has never stopped bleeding since delivery. Of note she is on Depo-Provera. Smoker. States she's not had her 6 week postpartum appointment yet.   Past Medical History  Diagnosis Date  . No pertinent past medical history   . Anxiety     as teen  . Seizures     as a teen EEG was normal  . Abnormal Pap smear   . Pregnancy induced hypertension     with last pregnancy  . Depression     as teen  . VBAC, delivered, current hospitalization 05/19/2011   Past Surgical History  Procedure Date  . Cesarean section   . Dilation and curettage of uterus   . Colposcopy   . Wisdom tooth extraction    History   Social History  . Marital Status: Married    Spouse Name: N/A    Number of Children: N/A  . Years of Education: N/A   Occupational History  . Not on file.   Social History Main Topics  . Smoking status: Current Everyday Smoker -- 1.0 packs/day  . Smokeless tobacco: Never Used  . Alcohol Use: Yes  . Drug Use: No  . Sexually Active: Yes   Other Topics Concern  . Not on file   Social History Narrative  . No narrative on file   No current facility-administered medications on file  prior to encounter.   No current outpatient prescriptions on file prior to encounter.   No Known Allergies  ROS: Pertinent items in HPI  OBJECTIVE Blood pressure 123/86, pulse 82, resp. rate 18.  BP 116-123/71-86, no significant changes in orthostatic VS  GENERAL: Well-developed, well-nourished female in no acute distress.  HEENT: Normocephalic, TMs normal HEART: RRR, no murmur RESP: normal effort, clear ABDOMEN: Soft, nontender, no organomegaly EXTREMITIES: Nontender, trace dep edema NEURO: Alert and oriented  LAB RESULTS Results for orders placed during the hospital encounter of 07/11/11 (from the past 24 hour(s))  URINALYSIS, ROUTINE W REFLEX MICROSCOPIC     Status: Abnormal   Collection Time   07/11/11  5:43 PM      Component Value Range   Color, Urine YELLOW  YELLOW   APPearance HAZY (*) CLEAR   Specific Gravity, Urine >1.030 (*) 1.005 - 1.030   pH 5.5  5.0 - 8.0   Glucose, UA NEGATIVE  NEGATIVE mg/dL   Hgb urine dipstick LARGE (*) NEGATIVE   Bilirubin Urine NEGATIVE  NEGATIVE   Ketones, ur NEGATIVE  NEGATIVE mg/dL   Protein, ur 30 (*) NEGATIVE mg/dL   Urobilinogen, UA 0.2  0.0 - 1.0 mg/dL   Nitrite NEGATIVE  NEGATIVE   Leukocytes, UA MODERATE (*)  NEGATIVE  URINE MICROSCOPIC-ADD ON     Status: Abnormal   Collection Time   07/11/11  5:43 PM      Component Value Range   Squamous Epithelial / LPF FEW (*) RARE   WBC, UA 11-20  <3 WBC/hpf   RBC / HPF 3-6  <3 RBC/hpf   Bacteria, UA FEW (*) RARE   Urine-Other MUCOUS PRESENT    POCT PREGNANCY, URINE     Status: Normal   Collection Time   07/11/11  6:05 PM      Component Value Range   Preg Test, Ur NEGATIVE  NEGATIVE  CBC     Status: Normal   Collection Time   07/11/11  6:36 PM      Component Value Range   WBC 8.8  4.0 - 10.5 K/uL   RBC 4.20  3.87 - 5.11 MIL/uL   Hemoglobin 13.5  12.0 - 15.0 g/dL   HCT 19.1  47.8 - 29.5 %   MCV 94.0  78.0 - 100.0 fL   MCH 32.1  26.0 - 34.0 pg   MCHC 34.2  30.0 - 36.0 g/dL   RDW  62.1  30.8 - 65.7 %   Platelets 255  150 - 400 K/uL  COMPREHENSIVE METABOLIC PANEL     Status: Normal   Collection Time   07/11/11  6:36 PM      Component Value Range   Sodium 137  135 - 145 mEq/L   Potassium 3.9  3.5 - 5.1 mEq/L   Chloride 104  96 - 112 mEq/L   CO2 24  19 - 32 mEq/L   Glucose, Bld 83  70 - 99 mg/dL   BUN 11  6 - 23 mg/dL   Creatinine, Ser 8.46  0.50 - 1.10 mg/dL   Calcium 9.4  8.4 - 96.2 mg/dL   Total Protein 6.7  6.0 - 8.3 g/dL   Albumin 4.1  3.5 - 5.2 g/dL   AST 16  0 - 37 U/L   ALT 18  0 - 35 U/L   Alkaline Phosphatase 63  39 - 117 U/L   Total Bilirubin 0.5  0.3 - 1.2 mg/dL   GFR calc non Af Amer >90  >90 mL/min   GFR calc Af Amer >90  >90 mL/min        ASSESSMENT Headache with mild dehydration Abnormal bleeding pattern related to Depoporvera  PLAN C/W Dr. Ellyn Hack. Will check urine C&S. Increase fluids and eat regularly. Tylenol, heating pad for pain. Keep appointment with Dr. Senaida Ores 07/19/11    Joeseph Verville 07/11/2011 7:03 PM

## 2011-07-11 NOTE — Progress Notes (Signed)
Pt. Refused to have discharge vital signs done. Pt. States, " I have had my vital signs taken enough today and they are fine."  Pt. Signed for discharge instructions and then left MAU.

## 2011-07-19 ENCOUNTER — Encounter (HOSPITAL_COMMUNITY): Payer: Self-pay | Admitting: Pharmacy Technician

## 2011-07-30 ENCOUNTER — Inpatient Hospital Stay (HOSPITAL_COMMUNITY): Admission: RE | Admit: 2011-07-30 | Payer: BC Managed Care – PPO | Source: Ambulatory Visit

## 2011-08-02 ENCOUNTER — Encounter (HOSPITAL_COMMUNITY): Payer: Self-pay

## 2011-08-02 ENCOUNTER — Encounter (HOSPITAL_COMMUNITY)
Admission: RE | Admit: 2011-08-02 | Discharge: 2011-08-02 | Disposition: A | Discharge status: Home / Self Care | Payer: BC Managed Care – PPO | Source: Ambulatory Visit | Attending: Obstetrics and Gynecology | Admitting: Obstetrics and Gynecology

## 2011-08-02 DIAGNOSIS — Z01812 Encounter for preprocedural laboratory examination: Secondary | ICD-10-CM | POA: Insufficient documentation

## 2011-08-02 DIAGNOSIS — Z01818 Encounter for other preprocedural examination: Secondary | ICD-10-CM | POA: Insufficient documentation

## 2011-08-02 HISTORY — DX: Gastro-esophageal reflux disease without esophagitis: K21.9

## 2011-08-02 HISTORY — DX: Headache: R51

## 2011-08-02 LAB — CBC
HCT: 38.3 % (ref 36.0–46.0)
Hemoglobin: 12.9 g/dL (ref 12.0–15.0)
MCH: 32.1 pg (ref 26.0–34.0)
MCHC: 33.7 g/dL (ref 30.0–36.0)
MCV: 95.3 fL (ref 78.0–100.0)
Platelets: 236 10*3/uL (ref 150–400)
RBC: 4.02 MIL/uL (ref 3.87–5.11)
RDW: 14.1 % (ref 11.5–15.5)
WBC: 5.4 10*3/uL (ref 4.0–10.5)

## 2011-08-02 LAB — SURGICAL PCR SCREEN
MRSA, PCR: NEGATIVE
Staphylococcus aureus: NEGATIVE

## 2011-08-02 NOTE — Patient Instructions (Addendum)
   Your procedure is scheduled on: Thursday July 11th  Enter through the Main Entrance of [facility] at: [employer] up the phone at the desk and dial [phone] and inform us of your arrival.  Please call this number if you have any problems the morning of surgery: [phone]  Remember: Do not eat food after midnight: Wednesday Do not drink clear liquids after: midnight Wednesday Take these medicines the morning of surgery with a SIP OF WATER:none  Do not wear jewelry, make-up, or FINGER nail polish No metal in your hair or on your body. Do not wear lotions, powders, perfumes or deodorant. Do not shave 48 hours prior to surgery. Do not bring valuables to the hospital. Contacts, dentures or bridgework may not be worn into surgery.    Patients discharged on the day of surgery will not be allowed to drive home.     Remember to use your hibiclens as instructed.Please shower with 1/2 bottle the evening before your surgery and the other 1/2 bottle the morning of surgery. Neck down avoiding private area.

## 2011-08-04 NOTE — H&P (Signed)
Erica [name] is an 31 y.o. female. [id number] coming in for scheduled laparoscopic BTL because desires permanent sterility.  She had a recent uncomplicated vaginal delivery 4/13.  She is otherwise healthy.  Pertinent Gynecological History: OB History: NSVD x 5                     C/S x 1                     SAB x 1   Menstrual History:  No LMP recorded.    Past Medical History  Diagnosis Date  . No pertinent past medical history   . Anxiety     as teen  . Abnormal Pap smear   . Depression     as teen  . VBAC, delivered, current hospitalization 05/19/2011  . Headache   . GERD (gastroesophageal reflux disease)     Past Surgical History  Procedure Date  . Cesarean section   . Colposcopy   . Wisdom tooth extraction   . Dilation and curettage of uterus 2010    Family History  Problem Relation Age of Onset  . Hypertension Mother   . Seizures Mother   . Hypertension Maternal Grandmother   . Arthritis Maternal Grandmother   . Cancer Maternal Grandmother     breast  . Stroke Maternal Grandfather   . Seizures Brother   . Mental illness Brother     paranoid schizophrenia  . Birth defects Daughter     hole in heart  . Seizures Maternal Aunt   . Seizures Brother   . Seizures Cousin   . Seizures Cousin   . Mental retardation Cousin     Social History:  reports that she has been smoking.  She has never used smokeless tobacco. She reports that she drinks alcohol. She reports that she does not use illicit drugs.  Allergies: No Known Allergies  No prescriptions prior to admission    ROS  not currently breastfeeding. Physical Exam  Constitutional: She is oriented to person, place, and time. She appears well-developed and well-nourished.  Cardiovascular: Normal rate and regular rhythm.   Respiratory: Effort normal and breath sounds normal.  GI: Soft. Bowel sounds are normal.  Genitourinary: Vagina normal and uterus normal.  Neurological: She is alert and oriented to  person, place, and time.  Psychiatric: She has a normal mood and affect. Her behavior is normal.      Assessment/Plan: Pt was counseled on risks and benefits of tubal ligation including bleeding infection and possible damage to bowel and bladder.  She was quoted a risk of failure of 1/100 with an increased risk of ectopic should pregnancy occur.  Desires to proceed.  Erica [name] W 08/04/2011, 11:30 AM

## 2011-08-05 ENCOUNTER — Ambulatory Visit (HOSPITAL_COMMUNITY)
Admission: RE | Admit: 2011-08-05 | Payer: BC Managed Care – PPO | Source: Ambulatory Visit | Admitting: Obstetrics and Gynecology

## 2011-08-05 ENCOUNTER — Encounter (HOSPITAL_COMMUNITY): Admission: RE | Payer: Self-pay | Source: Ambulatory Visit

## 2011-08-05 SURGERY — LIGATION, FALLOPIAN TUBE, LAPAROSCOPIC
Anesthesia: General | Laterality: Bilateral

## 2011-11-21 ENCOUNTER — Emergency Department (INDEPENDENT_AMBULATORY_CARE_PROVIDER_SITE_OTHER)
Admission: EM | Admit: 2011-11-21 | Discharge: 2011-11-21 | Disposition: A | Discharge status: Home / Self Care | Payer: Self-pay | Source: Home / Self Care | Attending: Emergency Medicine | Admitting: Emergency Medicine

## 2011-11-21 ENCOUNTER — Encounter (HOSPITAL_COMMUNITY): Payer: Self-pay | Admitting: Emergency Medicine

## 2011-11-21 DIAGNOSIS — R05 Cough: Secondary | ICD-10-CM

## 2011-11-21 DIAGNOSIS — J069 Acute upper respiratory infection, unspecified: Secondary | ICD-10-CM

## 2011-11-21 MED ORDER — ACETAMINOPHEN-CODEINE #3 300-30 MG PO TABS: 1.0000 | ORAL_TABLET | Freq: Four times a day (QID) | ORAL | Status: DC | PRN

## 2011-11-21 MED ORDER — CETIRIZINE-PSEUDOEPHEDRINE ER 5-120 MG PO TB12: 1.0000 | ORAL_TABLET | Freq: Every day | ORAL | Status: DC

## 2011-11-21 MED ORDER — AZITHROMYCIN 250 MG PO TABS: 250.0000 mg | ORAL_TABLET | Freq: Every day | ORAL | Status: AC

## 2011-11-21 NOTE — ED Notes (Addendum)
Pt c/o cough and congestion x 4 days.  Cough is dry and non productive. Stuffy nose. Some nasal draingage  Pt states that she is having a hard time bringing up mucus. Has concerns about bronchitis.  Pt denies fever,n,v,d.  Children have been sick recently with same symptoms.

## 2011-11-21 NOTE — ED Provider Notes (Signed)
History     [name]: [id number]  Arrival date & time 11/21/11  1356   First MD Initiated Contact with Patient 11/21/11 1403      Chief Complaint  Patient presents with  . Cough    cough gets worse at night. dry.  . Nasal Congestion    chest congestion not able to break up mucus in chest.    (Consider location/radiation/quality/duration/timing/severity/associated sxs/prior treatment) HPI Comments: Patient presents urgent care this afternoon complaining of ongoing cough with upper congestion such as her sinuses and drainage. Body aches and feeling tired for 4-5 days. She describes it her younger is child had a cold like illness that she took him to his pediatrician and they diagnosed him with bronchitis and started him on antibiotics. She feels she got the same and she needs antibiotics as well. Patient denies any fevers, myalgias, arthralgias, or shortness of breath.  Patient is a 31 y.o. female presenting with cough. The history is provided by the patient.  Cough This is a new problem. The current episode started more than 2 days ago. The problem has not changed since onset.The cough is non-productive. Associated symptoms include chills, ear pain, headaches, rhinorrhea and sore throat. Pertinent negatives include no myalgias, no shortness of breath and no wheezing. She has tried decongestants for the symptoms. The treatment provided no relief.    Past Medical History  Diagnosis Date  . No pertinent past medical history   . Anxiety     as teen  . Abnormal Pap smear   . Depression     as teen  . VBAC, delivered, current hospitalization 05/19/2011  . Headache   . GERD (gastroesophageal reflux disease)     Past Surgical History  Procedure Date  . Cesarean section   . Colposcopy   . Wisdom tooth extraction   . Dilation and curettage of uterus 2010    Family History  Problem Relation Age of Onset  . Hypertension Mother   . Seizures Mother   . Hypertension Maternal Grandmother    . Arthritis Maternal Grandmother   . Cancer Maternal Grandmother     breast  . Stroke Maternal Grandfather   . Seizures Brother   . Mental illness Brother     paranoid schizophrenia  . Birth defects Daughter     hole in heart  . Seizures Maternal Aunt   . Seizures Brother   . Seizures Cousin   . Seizures Cousin   . Mental retardation Cousin     History  Substance Use Topics  . Smoking status: Current Every Day Smoker -- 1.0 packs/day for 13 years  . Smokeless tobacco: Never Used  . Alcohol Use: Yes     rare    OB History    Grav Para Term Preterm Abortions TAB SAB Ect Mult Living   7 6 4 2 1  0 1 0 0 6      Review of Systems  Constitutional: Positive for chills and appetite change. Negative for fever.  HENT: Positive for ear pain, congestion, sore throat, rhinorrhea, postnasal drip and sinus pressure. Negative for trouble swallowing and voice change.   Respiratory: Positive for cough. Negative for choking, shortness of breath, wheezing and stridor.   Musculoskeletal: Negative for myalgias and gait problem.  Skin: Negative for rash.  Neurological: Positive for headaches. Negative for dizziness.    Allergies  Review of patient's allergies indicates no known allergies.  Home Medications   Current Outpatient Rx  Name Route Sig Dispense Refill  .  ACETAMINOPHEN-CODEINE #3 300-30 MG PO TABS Oral Take 1-2 tablets by mouth every 6 (six) hours as needed for pain. 15 tablet 0  . AZITHROMYCIN 250 MG PO TABS Oral Take 1 tablet (250 mg total) by mouth daily. Take first 2 tablets together, then 1 every day until finished. 6 tablet 0  . CETIRIZINE-PSEUDOEPHEDRINE ER 5-120 MG PO TB12 Oral Take 1 tablet by mouth daily. 30 tablet 0    BP 115/58  Pulse 55  Temp 98.8 F (37.1 C) (Oral)  Resp 18  SpO2 100%  LMP 11/21/2011  Breastfeeding? Unknown  Physical Exam  Nursing note and vitals reviewed. Constitutional: Vital signs are normal. She appears well-developed and  well-nourished.  Non-toxic appearance. She does not have a sickly appearance. She does not appear ill. No distress.  HENT:  Head: Normocephalic.  Right Ear: Tympanic membrane normal.  Left Ear: Tympanic membrane normal.  Nose: Rhinorrhea present. No mucosal edema.  Mouth/Throat: Uvula is midline, oropharynx is clear and moist and mucous membranes are normal. No posterior oropharyngeal edema or posterior oropharyngeal erythema.  Eyes: Conjunctivae normal are normal.  Neck: Normal range of motion.  Cardiovascular: Normal rate.  Exam reveals no gallop.   No murmur heard. Pulmonary/Chest: Effort normal and breath sounds normal.  Abdominal: Soft.  Neurological: She is alert.  Skin: Skin is warm. No rash noted. No erythema.    ED Course  Procedures (including critical care time)  Labs Reviewed - No data to display No results found.   1. Upper respiratory infection   2. Cough       MDM  Symptoms and exam consistent with a viral upper respiratory infection. Other household contacts at home with similar symptoms. Patient was somewhat argumentative that she needed antibiotics after a long discussions I decided to provide her with an antibiotic prescription describing in recommending to her that she needs to take Allegra-D along with Tylenol #3 for a few days and if no improvement to start and coming to the antibiotic cycle. After this discussion she agreed with this conservative management and will take antibiotics only if worsening symptoms or no improvement after a few days. Patient is afebrile with a normal respiratory exam.        Jimmie Molly, MD 11/21/11 1534

## 2011-11-23 NOTE — ED Notes (Signed)
Pt   Phoned  Stating   She  Could  Not  Afford  z  Max  And   She  Was   Still  Having  Symptoms   Dr  Artis Flock  Notified      May  Change  To     amox     500  Mg         Tid     Number     21          Pt  Would  Like  The  rx  Phoned  To  [facility]   [address]  [phone]

## 2011-12-09 ENCOUNTER — Emergency Department: Payer: Self-pay | Admitting: Emergency Medicine

## 2011-12-09 LAB — URINALYSIS, COMPLETE
Bilirubin,UR: NEGATIVE
Glucose,UR: NEGATIVE mg/dL (ref 0–75)
Ketone: NEGATIVE
Leukocyte Esterase: NEGATIVE
Nitrite: NEGATIVE
Specific Gravity: 1.027 (ref 1.003–1.030)
Squamous Epithelial: 15
WBC UR: 6 /HPF (ref 0–5)

## 2011-12-09 LAB — BASIC METABOLIC PANEL
Anion Gap: 7 (ref 7–16)
BUN: 18 mg/dL (ref 7–18)
Calcium, Total: 9.2 mg/dL (ref 8.5–10.1)
Co2: 28 mmol/L (ref 21–32)
Creatinine: 0.77 mg/dL (ref 0.60–1.30)
EGFR (African American): >60
EGFR (Non-African Amer.): >60
Glucose: 102 mg/dL — ABNORMAL HIGH (ref 65–99)
Osmolality: 289 (ref 275–301)
Sodium: 144 mmol/L (ref 136–145)

## 2011-12-09 LAB — PREGNANCY, URINE: Pregnancy Test, Urine: NEGATIVE m[IU]/mL

## 2011-12-09 LAB — CBC
HCT: 38.7 % (ref 35.0–47.0)
MCH: 32.2 pg (ref 26.0–34.0)
MCHC: 33.6 g/dL (ref 32.0–36.0)
RDW: 13.2 % (ref 11.5–14.5)
WBC: 8.3 10*3/uL (ref 3.6–11.0)

## 2011-12-11 ENCOUNTER — Emergency Department: Payer: Self-pay | Admitting: Emergency Medicine

## 2011-12-11 LAB — URINALYSIS, COMPLETE
Bilirubin,UR: NEGATIVE
Ketone: NEGATIVE
Nitrite: NEGATIVE
Ph: 7 (ref 4.5–8.0)
RBC,UR: 5 /HPF (ref 0–5)
Squamous Epithelial: 4

## 2011-12-12 LAB — CBC WITH DIFFERENTIAL/PLATELET
Eosinophil #: 0.2 10*3/uL (ref 0.0–0.7)
Eosinophil %: 2.4 %
HGB: 13.6 g/dL (ref 12.0–16.0)
Lymphocyte #: 3.8 10*3/uL — ABNORMAL HIGH (ref 1.0–3.6)
MCH: 33.3 pg (ref 26.0–34.0)
MCHC: 34.4 g/dL (ref 32.0–36.0)
MCV: 97 fL (ref 80–100)
Monocyte #: 0.6 x10 3/mm (ref 0.2–0.9)
Neutrophil %: 44.6 %
Platelet: 259 10*3/uL (ref 150–440)
RBC: 4.1 10*6/uL (ref 3.80–5.20)
RDW: 13.5 % (ref 11.5–14.5)

## 2011-12-12 LAB — BASIC METABOLIC PANEL
Anion Gap: 8 (ref 7–16)
Calcium, Total: 9.4 mg/dL (ref 8.5–10.1)
Chloride: 108 mmol/L — ABNORMAL HIGH (ref 98–107)
Co2: 25 mmol/L (ref 21–32)
Creatinine: 0.81 mg/dL (ref 0.60–1.30)
EGFR (African American): >60
Osmolality: 281 (ref 275–301)
Potassium: 3.5 mmol/L (ref 3.5–5.1)

## 2012-04-06 ENCOUNTER — Emergency Department (HOSPITAL_COMMUNITY)
Admission: EM | Admit: 2012-04-06 | Discharge: 2012-04-07 | Disposition: A | Discharge status: Home / Self Care | Payer: Medicaid Other | Attending: Emergency Medicine | Admitting: Emergency Medicine

## 2012-04-06 ENCOUNTER — Encounter (HOSPITAL_COMMUNITY): Payer: Self-pay | Admitting: *Deleted

## 2012-04-06 DIAGNOSIS — R110 Nausea: Secondary | ICD-10-CM | POA: Insufficient documentation

## 2012-04-06 DIAGNOSIS — Z3202 Encounter for pregnancy test, result negative: Secondary | ICD-10-CM | POA: Insufficient documentation

## 2012-04-06 DIAGNOSIS — G629 Polyneuropathy, unspecified: Secondary | ICD-10-CM

## 2012-04-06 DIAGNOSIS — F17200 Nicotine dependence, unspecified, uncomplicated: Secondary | ICD-10-CM | POA: Insufficient documentation

## 2012-04-06 DIAGNOSIS — R209 Unspecified disturbances of skin sensation: Secondary | ICD-10-CM | POA: Insufficient documentation

## 2012-04-06 DIAGNOSIS — G609 Hereditary and idiopathic neuropathy, unspecified: Secondary | ICD-10-CM | POA: Insufficient documentation

## 2012-04-06 DIAGNOSIS — R42 Dizziness and giddiness: Secondary | ICD-10-CM | POA: Insufficient documentation

## 2012-04-06 DIAGNOSIS — Z8719 Personal history of other diseases of the digestive system: Secondary | ICD-10-CM | POA: Insufficient documentation

## 2012-04-06 DIAGNOSIS — Z8659 Personal history of other mental and behavioral disorders: Secondary | ICD-10-CM | POA: Insufficient documentation

## 2012-04-06 DIAGNOSIS — R51 Headache: Secondary | ICD-10-CM | POA: Insufficient documentation

## 2012-04-06 NOTE — ED Notes (Addendum)
Whole body tingling; light headed and pale in the face today. 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 Feeling nauseated for 5 days. Did not get a flu shot. Having funny smells. Sweats, heart racing. Fear of seizures.

## 2012-04-06 NOTE — ED Notes (Signed)
Pt expressing anxiety that epilepsy runs in her family, and concerned that this could be related.

## 2012-04-07 ENCOUNTER — Ambulatory Visit: Payer: Self-pay | Admitting: Physician Assistant

## 2012-04-07 LAB — URINALYSIS, ROUTINE W REFLEX MICROSCOPIC
Glucose, UA: NEGATIVE mg/dL
Hgb urine dipstick: NEGATIVE
Ketones, ur: NEGATIVE mg/dL
Protein, ur: NEGATIVE mg/dL
Urobilinogen, UA: 0.2 mg/dL (ref 0.0–1.0)

## 2012-04-07 NOTE — ED Notes (Signed)
Pt expressing frustration over "lack of answers." This RN witnessed MD Manly's examination due to pt's anxiety. DC instructions reviewed. NAD noted. Pt with significant other.

## 2012-04-07 NOTE — ED Provider Notes (Signed)
History     [name]: [id number]  Arrival date & time 04/06/12  2330   First MD Initiated Contact with Patient 04/06/12 2356      Chief Complaint  Patient presents with  . Influenza  . Nausea  . 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 Patient has several concerns. She notes that she has had nausea, lightheaded, tingling sensation from head to toes - began last night. Constant but migratory throughout the body - lips, head, fingers - from left to right. Denies motor weakness.   Patient says that she read on the internet that the numbness and tingling may be related to seizure activity. She has an extensive FH of epilepsy and says that the worst fear in her life is having seizures. Although, she has never had seizure activity or an abnormal eeg.   Notes headache intermittent, qod x few weeks. No headache currently, bilateral parietal region, mild, resolves without intervention.   Patient says she has been to see [facility] in [place] for eval of this - as well as intermittent dizziness. She had echocardiogram ordered and performed but, has not gotten results. Notes that she has had thyroid studies, CBC, basic labs within the past 5 months as part of a new patient exam.   Had fatigue, lack of energy yesterday, myalgias. Took ibuprofen with improvement. No fever. No GI sx.   Past Medical History  Diagnosis Date  . No pertinent past medical history   . Anxiety     as teen  . Abnormal Pap smear   . Depression     as teen  . VBAC, delivered, current hospitalization 05/19/2011  . Headache   . GERD (gastroesophageal reflux disease)     Past Surgical History  Procedure Laterality Date  . Cesarean section    . Colposcopy    . Wisdom tooth extraction    . Dilation and curettage of uterus  2010    Family History  Problem Relation Age of Onset  . Hypertension Mother   .  Seizures Mother   . Hypertension Maternal Grandmother   . Arthritis Maternal Grandmother   . Cancer Maternal Grandmother     breast  . Stroke Maternal Grandfather   . Seizures Brother   . Mental illness Brother     paranoid schizophrenia  . Birth defects Daughter     hole in heart  . Seizures Maternal Aunt   . Seizures Brother   . Seizures Cousin   . Seizures Cousin   . Mental retardation Cousin     History  Substance Use Topics  . Smoking status: Current Every Day Smoker -- 1.00 packs/day for 13 years  . Smokeless tobacco: Never Used  . Alcohol Use: Yes     Comment: rare    OB History   Grav Para Term Preterm Abortions TAB SAB Ect Mult Living   7 6 4 2 1  0 1 0 0 6      Review of Systems Gen: no weight loss, fevers, chills, night sweats Eyes: no discharge or drainage, no occular pain or visual changes Nose: no epistaxis or rhinorrhea Mouth: no dental pain, no sore throat Neck: no neck pain Lungs: no SOB, cough, wheezing CV: no chest pain, palpitations, dependent edema or orthopnea Abd: As per history of present illness, otherwise negative GU: no dysuria or gross hematuria MSK: no myalgias or arthralgias Neuro: As per history of present illness, otherwise  negative Skin: no rash Psyche: negative.  Allergies  Review of patient's allergies indicates no known allergies.  Home Medications   Current Outpatient Rx  Name  Route  Sig  Dispense  Refill  . ibuprofen (ADVIL,MOTRIN) 200 MG tablet   Oral   Take 200 mg by mouth every 6 (six) hours as needed for pain.           BP 131/73  Pulse 78  Temp(Src) 98.1 F (36.7 C) (Oral)  Resp 14  SpO2 98%  LMP 03/23/2012  Physical Exam Gen: well developed and well nourished appearing Head: NCAT Eyes: PERL, EOMI Nose: no epistaixis or rhinorrhea Mouth/throat: mucosa is moist and pink Neck: supple, no stridor Lungs: CTA B, no wheezing, rhonchi or rales Abd: soft, notender, nondistended Back: no ttp, no cva  ttp Skin: no rashese, wnl Neuro: CN ii-xii grossly intact, no focal deficits, 5 out of 5 strength in all major muscle groups bilaterally, and sensation intact to light touch throughout, no dysmetria, normal gait. Psyche; normal affect,  calm and cooperative.   ED Course  Procedures (including critical care time)  Results for orders placed during the hospital encounter of 04/06/12 (from the past 48 hour(s))  URINALYSIS, ROUTINE W REFLEX MICROSCOPIC     Status: Abnormal   Collection Time    04/07/12 12:00 AM      Result Value Range   Color, Urine YELLOW  YELLOW   APPearance HAZY (*) CLEAR   Specific Gravity, Urine 1.026  1.005 - 1.030   pH 5.5  5.0 - 8.0   Glucose, UA NEGATIVE  NEGATIVE mg/dL   Hgb urine dipstick NEGATIVE  NEGATIVE   Bilirubin Urine NEGATIVE  NEGATIVE   Ketones, ur NEGATIVE  NEGATIVE mg/dL   Protein, ur NEGATIVE  NEGATIVE mg/dL   Urobilinogen, UA 0.2  0.0 - 1.0 mg/dL   Nitrite NEGATIVE  NEGATIVE   Leukocytes, UA NEGATIVE  NEGATIVE   Comment: MICROSCOPIC NOT DONE ON URINES WITH NEGATIVE PROTEIN, BLOOD, LEUKOCYTES, NITRITE, OR GLUCOSE <1000 mg/dL.  PREGNANCY, URINE     Status: None   Collection Time    04/07/12 12:00 AM      Result Value Range   Preg Test, Ur NEGATIVE  NEGATIVE   Comment:            THE SENSITIVITY OF THIS     METHODOLOGY IS >20 mIU/mL.     MDM  The patient has a normal neurologic exam and is reassured that migrating paresthesias are not likely to represent an impending seizure. She is stable for discharge and will follow up with her PCP for further evaluation of same.         Brandt Loosen, MD 04/08/12 [phone]

## 2012-06-07 ENCOUNTER — Ambulatory Visit: Payer: Self-pay | Admitting: Internal Medicine

## 2013-02-16 ENCOUNTER — Encounter (HOSPITAL_COMMUNITY): Payer: Self-pay | Admitting: Emergency Medicine

## 2013-02-16 ENCOUNTER — Emergency Department (INDEPENDENT_AMBULATORY_CARE_PROVIDER_SITE_OTHER)
Admission: EM | Admit: 2013-02-16 | Discharge: 2013-02-16 | Disposition: A | Discharge status: Home / Self Care | Payer: BC Managed Care – PPO | Source: Home / Self Care | Attending: Family Medicine | Admitting: Family Medicine

## 2013-02-16 ENCOUNTER — Emergency Department (HOSPITAL_COMMUNITY): Payer: Medicaid Other

## 2013-02-16 ENCOUNTER — Emergency Department (INDEPENDENT_AMBULATORY_CARE_PROVIDER_SITE_OTHER): Payer: BC Managed Care – PPO

## 2013-02-16 DIAGNOSIS — S022XXA Fracture of nasal bones, initial encounter for closed fracture: Secondary | ICD-10-CM

## 2013-02-16 DIAGNOSIS — S8000XA Contusion of unspecified knee, initial encounter: Secondary | ICD-10-CM

## 2013-02-16 LAB — POCT PREGNANCY, URINE: Preg Test, Ur: NEGATIVE

## 2013-02-16 NOTE — Discharge Instructions (Signed)
Thank you for coming in today. Take up to 2 Aleve twice daily for pain. Apply ice to your knee for 10-15 minutes 2-3 times daily Followup with Dr. Katrinka BlazingSmith or [facility] if your knee is not getting better. , \ If your nose is not looking right followup with [facility].  Call or go to the emergency room if you get worse, have trouble breathing, have chest pains, or palpitations.
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
## 2013-05-08 ENCOUNTER — Encounter (HOSPITAL_COMMUNITY): Payer: Self-pay | Admitting: Emergency Medicine

## 2013-05-08 ENCOUNTER — Emergency Department (INDEPENDENT_AMBULATORY_CARE_PROVIDER_SITE_OTHER)
Admission: EM | Admit: 2013-05-08 | Discharge: 2013-05-08 | Disposition: A | Discharge status: Home / Self Care | Payer: BC Managed Care – PPO | Source: Home / Self Care | Attending: Family Medicine | Admitting: Family Medicine

## 2013-05-08 ENCOUNTER — Other Ambulatory Visit (HOSPITAL_COMMUNITY)
Admission: RE | Admit: 2013-05-08 | Discharge: 2013-05-08 | Disposition: A | Discharge status: Home / Self Care | Payer: BC Managed Care – PPO | Source: Ambulatory Visit | Attending: Family Medicine | Admitting: Family Medicine

## 2013-05-08 DIAGNOSIS — Z113 Encounter for screening for infections with a predominantly sexual mode of transmission: Secondary | ICD-10-CM | POA: Insufficient documentation

## 2013-05-08 DIAGNOSIS — J309 Allergic rhinitis, unspecified: Secondary | ICD-10-CM

## 2013-05-08 DIAGNOSIS — N760 Acute vaginitis: Secondary | ICD-10-CM | POA: Insufficient documentation

## 2013-05-08 DIAGNOSIS — R519 Headache, unspecified: Secondary | ICD-10-CM

## 2013-05-08 DIAGNOSIS — R109 Unspecified abdominal pain: Secondary | ICD-10-CM

## 2013-05-08 DIAGNOSIS — E860 Dehydration: Secondary | ICD-10-CM

## 2013-05-08 DIAGNOSIS — R51 Headache: Secondary | ICD-10-CM

## 2013-05-08 LAB — POCT URINALYSIS DIP (DEVICE)
GLUCOSE, UA: NEGATIVE mg/dL
HGB URINE DIPSTICK: NEGATIVE
Leukocytes, UA: NEGATIVE
Nitrite: NEGATIVE
Protein, ur: NEGATIVE mg/dL
Specific Gravity, Urine: >=1.03 (ref 1.005–1.030)
UROBILINOGEN UA: 0.2 mg/dL (ref 0.0–1.0)
pH: 6 (ref 5.0–8.0)

## 2013-05-08 LAB — POCT PREGNANCY, URINE: Preg Test, Ur: NEGATIVE

## 2013-05-08 MED ORDER — DOXYCYCLINE HYCLATE 100 MG PO CAPS: 100.0000 mg | ORAL_CAPSULE | Freq: Two times a day (BID) | ORAL | Status: DC

## 2013-05-08 NOTE — ED Provider Notes (Signed)
[name]: [id number]     Arrival date & time 05/08/13  1722 History   First MD Initiated Contact with Patient 05/08/13 1828     Chief Complaint  Patient presents with  . Headache  . Nausea  . Fatigue   (Consider location/radiation/quality/duration/timing/severity/associated sxs/prior Treatment) HPI Comments: 33 year old female presents complaining of being sick for 2 weeks. She has nasal congestion, sinus pressure, sinus headache, sore throat, lower abdominal pain, vaginal discharge, her general fatigue, and just feeling unwell. She thinks that she has a sinus infection and a bladder infection, and she also thinks she might be dehydrated. She has no hematuria or dysuria. She drinks about 8 ounces of water daily. The rest of her fluid intake is sodas.  Patient is a 33 y.o. female presenting with headaches.  Headache Associated symptoms: abdominal pain, congestion, drainage, ear pain, fatigue, myalgias, sinus pressure and sore throat   Associated symptoms: no cough, no diarrhea, no dizziness, no fever, no nausea and no vomiting     Past Medical History  Diagnosis Date  . No pertinent past medical history   . Anxiety     as teen  . Abnormal Pap smear   . Depression     as teen  . VBAC, delivered, current hospitalization 05/19/2011  . Headache(784.0)   . GERD (gastroesophageal reflux disease)    Past Surgical History  Procedure Laterality Date  . Cesarean section    . Colposcopy    . Wisdom tooth extraction    . Dilation and curettage of uterus  2010   Family History  Problem Relation Age of Onset  . Hypertension Mother   . Seizures Mother   . Hypertension Maternal Grandmother   . Arthritis Maternal Grandmother   . Cancer Maternal Grandmother     breast  . Stroke Maternal Grandfather   . Seizures Brother   . Mental illness Brother     paranoid schizophrenia  . Birth defects Daughter     hole in heart  . Seizures Maternal Aunt   . Seizures Brother   . Seizures Cousin   .  Seizures Cousin   . Mental retardation Cousin    History  Substance Use Topics  . Smoking status: Current Every Day Smoker -- 1.00 packs/day for 13 years  . Smokeless tobacco: Never Used  . Alcohol Use: Yes     Comment: rare   OB History   Grav Para Term Preterm Abortions TAB SAB Ect Mult Living   7 6 4 2 1  0 1 0 0 6     Review of Systems  Constitutional: Positive for fatigue. Negative for fever, chills and unexpected weight change.  HENT: Positive for congestion, ear pain, postnasal drip, rhinorrhea, sinus pressure and sore throat.   Eyes: Negative for visual disturbance.  Respiratory: Negative for cough and shortness of breath.   Cardiovascular: Negative for chest pain, palpitations and leg swelling.  Gastrointestinal: Positive for abdominal pain. Negative for nausea, vomiting, diarrhea and constipation.  Endocrine: Negative for polydipsia and polyuria.  Genitourinary: Positive for vaginal discharge and pelvic pain. Negative for dysuria, urgency and frequency.  Musculoskeletal: Positive for myalgias. Negative for arthralgias.  Skin: Negative for rash.  Neurological: Positive for headaches. Negative for dizziness, weakness and light-headedness.    Allergies  Review of patient's allergies indicates no known allergies.  Home Medications   Prior to Admission medications   Medication Sig Start Date End Date Taking? Authorizing Provider  ibuprofen (ADVIL,MOTRIN) 200 MG tablet Take 200 mg by mouth  every 6 (six) hours as needed for pain.    Historical Provider, MD   Breastfeeding? No Physical Exam  Nursing note and vitals reviewed. Constitutional: She is oriented to person, place, and time. Vital signs are normal. She appears well-developed and well-nourished. No distress.  HENT:  Head: Normocephalic and atraumatic.  Right Ear: Tympanic membrane is not injected and not erythematous.  Left Ear: Tympanic membrane is injected and erythematous.  Nose: Mucosal edema present. Right  sinus exhibits no maxillary sinus tenderness and no frontal sinus tenderness. Left sinus exhibits no maxillary sinus tenderness and no frontal sinus tenderness.  Mouth/Throat: Uvula is midline, oropharynx is clear and moist and mucous membranes are normal.  Neck: Normal range of motion. Neck supple. No tracheal deviation present.  Cardiovascular: Normal rate, regular rhythm and normal heart sounds.  Exam reveals no gallop and no friction rub.   No murmur heard. Pulmonary/Chest: Effort normal and breath sounds normal. No respiratory distress. She has no wheezes. She has no rales.  Abdominal: Normal appearance and bowel sounds are normal. There is no hepatosplenomegaly. There is tenderness in the suprapubic area. There is no rigidity, no rebound, no guarding, no CVA tenderness, no tenderness at McBurney's point and negative Murphy's sign. No hernia.  Genitourinary: Vagina normal and uterus normal. There is no tenderness or lesion on the right labia. There is no tenderness or lesion on the left labia. Cervix exhibits no motion tenderness. Right adnexum displays tenderness. Left adnexum displays no tenderness.  Lymphadenopathy:    She has no cervical adenopathy.       Right: No inguinal adenopathy present.       Left: No inguinal adenopathy present.  Neurological: She is alert and oriented to person, place, and time. She has normal strength. Coordination normal.  Skin: Skin is warm and dry. No rash noted. She is not diaphoretic.  Psychiatric: She has a normal mood and affect. Judgment normal.    ED Course  Procedures (including critical care time) Labs Review Labs Reviewed  POCT URINALYSIS DIP (DEVICE) - Abnormal; Notable for the following:    Bilirubin Urine SMALL (*)    Ketones, ur TRACE (*)    All other components within normal limits  URINE CULTURE  POCT PREGNANCY, URINE    Results for orders placed during the hospital encounter of 05/08/13  POCT PREGNANCY, URINE      Result Value Ref  Range   Preg Test, Ur NEGATIVE  NEGATIVE  POCT URINALYSIS DIP (DEVICE)      Result Value Ref Range   Glucose, UA NEGATIVE  NEGATIVE mg/dL   Bilirubin Urine SMALL (*) NEGATIVE   Ketones, ur TRACE (*) NEGATIVE mg/dL   Specific Gravity, Urine >=1.030  1.005 - 1.030   Hgb urine dipstick NEGATIVE  NEGATIVE   pH 6.0  5.0 - 8.0   Protein, ur NEGATIVE  NEGATIVE mg/dL   Urobilinogen, UA 0.2  0.0 - 1.0 mg/dL   Nitrite NEGATIVE  NEGATIVE   Leukocytes, UA NEGATIVE  NEGATIVE   Imaging Review No results found.   MDM   1. Allergic rhinosinusitis   2. Dehydration   3. Abdominal pain   4. Sinus headache    F/u with GYN for further eval of mild adnexal tenderness, go to ED if worsening.  Will treat for allergies and discussed rehydration.  Start ABx if not improving in a few days.  F/u PRN    Meds ordered this encounter  Medications  . doxycycline (VIBRAMYCIN) 100 MG capsule  Sig: Take 1 capsule (100 mg total) by mouth 2 (two) times daily.    Dispense:  20 capsule    Refill:  0    Order Specific Question:  Supervising Provider    Answer:  Bradd Canary D [[id number]]       Graylon Good, PA-C 05/08/13 1941

## 2013-05-08 NOTE — ED Notes (Signed)
C/o  Nausea.  Pressure headache. Right lower pelvic pain that radiates to back.  Irregular cycle for the past two months.  Cramps in feet and legs.   And ear pain.     States took home preg test recently which came back negative.  Pt has tried resting with no relief of symptoms.   Symptoms present x 2 wks.

## 2013-05-08 NOTE — Discharge Instructions (Signed)
Take Advil Allergy Sinus for 3-4 days, and if you are not starting to get any better then you can start the antibiotic.    Rehydration, Adult Rehydration is the replacement of body fluids lost during dehydration. Dehydration is an extreme loss of body fluids to the point of body function impairment. There are many ways extreme fluid loss can occur, including vomiting, diarrhea, or excess sweating. Recovering from dehydration requires replacing lost fluids, continuing to eat to maintain strength, and avoiding foods and beverages that may contribute to further fluid loss or may increase nausea. HOW TO REHYDRATE In most cases, rehydration involves the replacement of not only fluids but also carbohydrates and basic body salts. Rehydration with an oral rehydration solution is one way to replace essential nutrients lost through dehydration. An oral rehydration solution can be purchased at pharmacies, retail stores, and online. Premixed packets of powder that you combine with water to make a solution are also sold. You can prepare an oral rehydration solution at home by mixing the following ingredients together:      tsp table salt.   tsp baking soda.   tsp salt substitute containing potassium chloride.  1 tablespoons sugar.  1 L (34 oz) of water. Be sure to use exact measurements. Including too much sugar can make diarrhea worse. Drink  1 cup (120 240 mL) of oral rehydration solution each time you have diarrhea or vomit. If drinking this amount makes your vomiting worse, try drinking smaller amounts more often. For example, drink 1 3 tsp every 5 10 minutes.  A general rule for staying hydrated is to drink 1 2 L of fluid per day. Talk to your caregiver about the specific amount you should be drinking each day. Drink enough fluids to keep your urine clear or pale yellow. EATING WHEN DEHYDRATED Even if you have had severe sweating or you are having diarrhea, do not stop eating. Many healthy items in a  normal diet are okay to continue eating while recovering from dehydration. The following tips can help you to lessen nausea when you eat:  Ask someone else to prepare your food. Cooking smells may worsen nausea.  Eat in a well-ventilated room away from cooking smells.  Sit up when you eat. Avoid lying down until 1 2 hours after eating.  Eat small amounts when you eat.  Eat foods that are easy to digest. These include soft, well-cooked, or mashed foods. FOODS AND BEVERAGES TO AVOID Avoid eating or drinking the following foods and beverages that may increase nausea or further loss of fluid:   Fruit juices with a high sugar content, such as concentrated juices.  Alcohol.  Beverages containing caffeine.  Carbonated drinks. They may cause a lot of gas.  Foods that may cause a lot of gas, such as cabbage, broccoli, and beans.  Fatty, greasy, and fried foods.  Spicy, very salty, and very sweet foods or drinks.  Foods or drinks that are very hot or very cold. Consume food or drinks at or near room temperature.  Foods that need a lot of chewing, such as raw vegetables.  Foods that are sticky or hard to swallow, such as peanut butter. Document Released: 04/05/2011 Document Revised: 10/06/2011 Document Reviewed: 04/05/2011 [facility] Patient Information 2014 [place], [place].  Sinus Headache A sinus headache is when your sinuses become clogged or swollen. Sinus headaches can range from mild to severe.  CAUSES A sinus headache can have different causes, such as:  Colds.  Sinus infections.  Allergies. SYMPTOMS  Symptoms of a sinus headache may vary and can include:  Headache.  Pain or pressure in the face.  Congested or runny nose.  Fever.  Inability to smell.  Pain in upper teeth. Weather changes can make symptoms worse. TREATMENT  The treatment of a sinus headache depends on the cause.  Sinus pain caused by a sinus infection may be treated with antibiotic  medicine.  Sinus pain caused by allergies may be helped by allergy medicines (antihistamines) and medicated nasal sprays.  Sinus pain caused by congestion may be helped by flushing the nose and sinuses with saline solution. HOME CARE INSTRUCTIONS   If antibiotics are prescribed, take them as directed. Finish them even if you start to feel better.  Only take over-the-counter or prescription medicines for pain, discomfort, or fever as directed by your caregiver.  If you have congestion, use a nasal spray to help reduce pressure. SEEK IMMEDIATE MEDICAL CARE IF:  You have a fever.  You have headaches more than once a week.  You have sensitivity to light or sound.  You have repeated nausea and vomiting.  You have vision problems.  You have sudden, severe pain in your face or head.  You have a seizure.  You are confused.  Your sinus headaches do not get better after treatment. Many people think they have a sinus headache when they actually have migraines or tension headaches. MAKE SURE YOU:   Understand these instructions.  Will watch your condition.  Will get help right away if you are not doing well or get worse. Document Released: 02/19/2004 Document Revised: 04/05/2011 Document Reviewed: 04/11/2010 [facility] Patient Information 2014 [place], [place].  Hay Fever Hay fever is an allergic reaction to particles in the air. It cannot be passed from person to person. It cannot be cured, but it can be controlled. CAUSES  Hay fever is caused by something that triggers an allergic reaction (allergens). The following are examples of allergens:  Ragweed.  Feathers.  Animal dander.  Grass and tree pollens.  Cigarette smoke.  House dust.  Pollution. SYMPTOMS   Sneezing.  Runny or stuffy nose.  Tearing eyes.  Itchy eyes, nose, mouth, throat, skin, or other area.  Sore throat.  Headache.  Decreased sense of smell or taste. DIAGNOSIS Your caregiver will perform  a physical exam and ask questions about the symptoms you are having.Allergy testing may be done to determine exactly what triggers your hay fever.  TREATMENT   Over-the-counter medicines may help symptoms. These include:  Antihistamines.  Decongestants. These may help with nasal congestion.  Your caregiver may prescribe medicines if over-the-counter medicines do not work.  Some people benefit from allergy shots when other medicines are not helpful. HOME CARE INSTRUCTIONS   Avoid the allergen that is causing your symptoms, if possible.  Take all medicine as told by your caregiver. SEEK MEDICAL CARE IF:   You have severe allergy symptoms and your current medicines are not helping.  Your treatment was working at one time, but you are now experiencing symptoms.  You have sinus congestion and pressure.  You develop a fever or headache.  You have thick nasal discharge.  You have asthma and have a worsening cough and wheezing. SEEK IMMEDIATE MEDICAL CARE IF:   You have swelling of your tongue or lips.  You have trouble breathing.  You feel lightheaded or like you are going to faint.  You have cold sweats.  You have a fever. Document Released: 01/11/2005 Document Revised: 04/05/2011 Document Reviewed:  04/08/2010 [employer] Patient Information 2014 [place], [place].

## 2013-05-09 LAB — CERVICOVAGINAL ANCILLARY ONLY
Chlamydia: NEGATIVE
Neisseria Gonorrhea: NEGATIVE
WET PREP (BD AFFIRM): NEGATIVE
WET PREP (BD AFFIRM): NEGATIVE
WET PREP (BD AFFIRM): NEGATIVE

## 2013-05-09 NOTE — ED Provider Notes (Signed)
Medical screening examination/treatment/procedure(s) were performed by a resident physician or non-physician practitioner and as the supervising physician I was immediately available for consultation/collaboration.  Jennye Runquist, MD   Mohsin Crum S Zadkiel Dragan, MD 05/09/13 0845 

## 2013-05-10 LAB — URINE CULTURE: Colony Count: 8000

## 2013-05-12 ENCOUNTER — Emergency Department (HOSPITAL_COMMUNITY)
Admission: EM | Admit: 2013-05-12 | Discharge: 2013-05-12 | Disposition: A | Discharge status: Home / Self Care | Payer: BC Managed Care – PPO | Attending: Emergency Medicine | Admitting: Emergency Medicine

## 2013-05-12 ENCOUNTER — Encounter (HOSPITAL_COMMUNITY): Payer: Self-pay | Admitting: Emergency Medicine

## 2013-05-12 ENCOUNTER — Emergency Department (HOSPITAL_COMMUNITY): Payer: BC Managed Care – PPO

## 2013-05-12 DIAGNOSIS — R509 Fever, unspecified: Secondary | ICD-10-CM | POA: Insufficient documentation

## 2013-05-12 DIAGNOSIS — Z8659 Personal history of other mental and behavioral disorders: Secondary | ICD-10-CM | POA: Insufficient documentation

## 2013-05-12 DIAGNOSIS — R52 Pain, unspecified: Secondary | ICD-10-CM | POA: Insufficient documentation

## 2013-05-12 DIAGNOSIS — M791 Myalgia, unspecified site: Secondary | ICD-10-CM

## 2013-05-12 DIAGNOSIS — K219 Gastro-esophageal reflux disease without esophagitis: Secondary | ICD-10-CM | POA: Insufficient documentation

## 2013-05-12 DIAGNOSIS — IMO0001 Reserved for inherently not codable concepts without codable children: Secondary | ICD-10-CM | POA: Insufficient documentation

## 2013-05-12 DIAGNOSIS — F17200 Nicotine dependence, unspecified, uncomplicated: Secondary | ICD-10-CM | POA: Insufficient documentation

## 2013-05-12 DIAGNOSIS — Z3202 Encounter for pregnancy test, result negative: Secondary | ICD-10-CM | POA: Insufficient documentation

## 2013-05-12 DIAGNOSIS — Z8669 Personal history of other diseases of the nervous system and sense organs: Secondary | ICD-10-CM | POA: Insufficient documentation

## 2013-05-12 LAB — BASIC METABOLIC PANEL
BUN: 15 mg/dL (ref 6–23)
CO2: 25 mEq/L (ref 19–32)
Calcium: 9.4 mg/dL (ref 8.4–10.5)
Chloride: 107 mEq/L (ref 96–112)
Creatinine, Ser: 0.8 mg/dL (ref 0.50–1.10)
GFR calc non Af Amer: >90 mL/min (ref >90)
Glucose, Bld: 86 mg/dL (ref 70–99)
POTASSIUM: 3.9 meq/L (ref 3.7–5.3)
SODIUM: 144 meq/L (ref 137–147)

## 2013-05-12 LAB — CBC WITH DIFFERENTIAL/PLATELET
Basophils Absolute: 0.1 10*3/uL (ref 0.0–0.1)
Basophils Relative: 2 % — ABNORMAL HIGH (ref 0–1)
EOS ABS: 0.1 10*3/uL (ref 0.0–0.7)
EOS PCT: 2 % (ref 0–5)
HCT: 37.3 % (ref 36.0–46.0)
Hemoglobin: 12.5 g/dL (ref 12.0–15.0)
LYMPHS PCT: 42 % (ref 12–46)
Lymphs Abs: 3 10*3/uL (ref 0.7–4.0)
MCH: 32.3 pg (ref 26.0–34.0)
MCHC: 33.5 g/dL (ref 30.0–36.0)
MCV: 96.4 fL (ref 78.0–100.0)
Monocytes Absolute: 0.5 10*3/uL (ref 0.1–1.0)
Monocytes Relative: 7 % (ref 3–12)
NEUTROS PCT: 47 % (ref 43–77)
Neutro Abs: 3.5 10*3/uL (ref 1.7–7.7)
PLATELETS: 210 10*3/uL (ref 150–400)
RBC: 3.87 MIL/uL (ref 3.87–5.11)
RDW: 13.2 % (ref 11.5–15.5)
WBC: 7.2 10*3/uL (ref 4.0–10.5)

## 2013-05-12 LAB — URINALYSIS, ROUTINE W REFLEX MICROSCOPIC
Bilirubin Urine: NEGATIVE
Glucose, UA: NEGATIVE mg/dL
Hgb urine dipstick: NEGATIVE
Ketones, ur: NEGATIVE mg/dL
Leukocytes, UA: NEGATIVE
NITRITE: NEGATIVE
PROTEIN: NEGATIVE mg/dL
Specific Gravity, Urine: 1.025 (ref 1.005–1.030)
UROBILINOGEN UA: 1 mg/dL (ref 0.0–1.0)
pH: 7.5 (ref 5.0–8.0)

## 2013-05-12 LAB — POC URINE PREG, ED: Preg Test, Ur: NEGATIVE

## 2013-05-12 NOTE — ED Notes (Signed)
MD Manly at bedside. 

## 2013-05-12 NOTE — ED Provider Notes (Signed)
[name]: [id number]     Arrival date & time 05/12/13  0032 History   First MD Initiated Contact with Patient 05/12/13 [phone]     Chief Complaint  Patient presents with  . Generalized Body Aches  . Sore Throat     (Consider location/radiation/quality/duration/timing/severity/associated sxs/prior Treatment) HPI This patient is a 33 year old woman who presents with complaints of a low-grade fever for the past 2 days. She says her temp has been as high as 99.5 when taken orally. It has also been regular and numerous occasions. She notes a feeling of fatigue. She says she can feel when she is running a fever. She denies URI symptoms, cough, shortness of breath, chest pain abdominal pain genitourinary symptoms.  The patient says that she was diagnosed with otitis media on the left at urgent care a couple days ago. She was prescribed doxycycline and has been compliant with this medication. 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  Past Medical History  Diagnosis Date  . No pertinent past medical history   . Anxiety     as teen  . Abnormal Pap smear   . Depression     as teen  . VBAC, delivered, current hospitalization 05/19/2011  . Headache(784.0)   . GERD (gastroesophageal reflux disease)    Past Surgical History  Procedure Laterality Date  . Cesarean section    . Colposcopy    . Wisdom tooth extraction    . Dilation and curettage of uterus  2010   Family History  Problem Relation Age of Onset  . Hypertension Mother   . Seizures Mother   . Hypertension Maternal Grandmother   . Arthritis Maternal Grandmother   . Cancer Maternal Grandmother     breast  . Stroke Maternal Grandfather   . Seizures Brother   . Mental illness Brother     paranoid schizophrenia  . Birth defects Daughter     hole in heart  . Seizures Maternal Aunt   . Seizures Brother   . Seizures Cousin   . Seizures Cousin   . Mental retardation Cousin    History   Substance Use Topics  . Smoking status: Current Every Day Smoker -- 1.00 packs/day for 13 years  . Smokeless tobacco: Never Used  . Alcohol Use: Yes     Comment: rare   OB History   Grav Para Term Preterm Abortions TAB SAB Ect Mult Living   7 6 4 2 1  0 1 0 0 6     Review of Systems Ten point review of symptoms performed and is negative with the exception of symptoms noted above.     Allergies  Review of patient's allergies indicates no known allergies.  Home Medications   Prior to Admission medications   Medication Sig Start Date End Date Taking? Authorizing Provider  ibuprofen (ADVIL,MOTRIN) 200 MG tablet Take 200 mg by mouth every 6 (six) hours as needed for pain.   Yes Historical Provider, MD  doxycycline (VIBRAMYCIN) 100 MG capsule Take 1 capsule (100 mg total) by mouth 2 (two) times daily. 05/08/13   Adrian BlackwaterZachary H Baker, PA-C   BP 130/73  Pulse 81  Temp(Src) 98.1 F (36.7 C) (Oral)  Resp 18  Wt 140 lb (63.504 kg)  SpO2 97%  LMP 04/29/2013 Physical Exam Gen: well developed and well nourished appearing Head: NCAT Eyes: PERL, EOMI Ears: External auditory canal on the left is a little erythematous, normal on the  right, TMs normal bilaterally Nose: no epistaixis or rhinorrhea Mouth/throat: mucosa is moist and pink Neck: supple, no stridor, no adenopathy Lungs: CTA B, no wheezing, rhonchi or rales CV: regular rate and rythm, good distal pulses.  Abd: soft, notender, nondistended Back: no ttp, no cva ttp Skin: warm and dry Ext: no edema, normal to inspection Neuro: CN ii-xii grossly intact, no focal deficits Psyche; normal affect,  calm and cooperative. ED Course  Procedures (including critical care time) Labs Review  Results for orders placed during the hospital encounter of 05/12/13 (from the past 24 hour(s))  CBC WITH DIFFERENTIAL     Status: Abnormal   Collection Time    05/12/13  1:25 AM      Result Value Ref Range   WBC 7.2  4.0 - 10.5 K/uL   RBC 3.87   3.87 - 5.11 MIL/uL   Hemoglobin 12.5  12.0 - 15.0 g/dL   HCT 16.137.3  09.636.0 - 04.546.0 %   MCV 96.4  78.0 - 100.0 fL   MCH 32.3  26.0 - 34.0 pg   MCHC 33.5  30.0 - 36.0 g/dL   RDW 40.913.2  81.111.5 - 91.415.5 %   Platelets 210  150 - 400 K/uL   Neutrophils Relative % 47  43 - 77 %   Lymphocytes Relative 42  12 - 46 %   Monocytes Relative 7  3 - 12 %   Eosinophils Relative 2  0 - 5 %   Basophils Relative 2 (*) 0 - 1 %   Neutro Abs 3.5  1.7 - 7.7 K/uL   Lymphs Abs 3.0  0.7 - 4.0 K/uL   Monocytes Absolute 0.5  0.1 - 1.0 K/uL   Eosinophils Absolute 0.1  0.0 - 0.7 K/uL   Basophils Absolute 0.1  0.0 - 0.1 K/uL  BASIC METABOLIC PANEL     Status: None   Collection Time    05/12/13  1:25 AM      Result Value Ref Range   Sodium 144  137 - 147 mEq/L   Potassium 3.9  3.7 - 5.3 mEq/L   Chloride 107  96 - 112 mEq/L   CO2 25  19 - 32 mEq/L   Glucose, Bld 86  70 - 99 mg/dL   BUN 15  6 - 23 mg/dL   Creatinine, Ser 7.820.80  0.50 - 1.10 mg/dL   Calcium 9.4  8.4 - 95.610.5 mg/dL   GFR calc non Af Amer >90  >90 mL/min   GFR calc Af Amer >90  >90 mL/min  URINALYSIS, ROUTINE W REFLEX MICROSCOPIC     Status: Abnormal   Collection Time    05/12/13  2:21 AM      Result Value Ref Range   Color, Urine YELLOW  YELLOW   APPearance CLOUDY (*) CLEAR   Specific Gravity, Urine 1.025  1.005 - 1.030   pH 7.5  5.0 - 8.0   Glucose, UA NEGATIVE  NEGATIVE mg/dL   Hgb urine dipstick NEGATIVE  NEGATIVE   Bilirubin Urine NEGATIVE  NEGATIVE   Ketones, ur NEGATIVE  NEGATIVE mg/dL   Protein, ur NEGATIVE  NEGATIVE mg/dL   Urobilinogen, UA 1.0  0.0 - 1.0 mg/dL   Nitrite NEGATIVE  NEGATIVE   Leukocytes, UA NEGATIVE  NEGATIVE  POC URINE PREG, ED     Status: None   Collection Time    05/12/13  2:22 AM      Result Value Ref Range   Preg Test, Ur NEGATIVE  NEGATIVE    Imaging Review Dg Chest 2 View  05/12/2013   CLINICAL DATA:  Multiple complaints. Patient has felt off for 2 weeks. Lower abdominal pain, headaches, body aches. Low grade  fever. Sore throat.  EXAM: CHEST  2 VIEW  COMPARISON:  DG CHEST 2 VIEW dated 08/06/2010  FINDINGS: The heart size and mediastinal contours are within normal limits. Both lungs are clear. The visualized skeletal structures are unremarkable.  IMPRESSION: No active cardiopulmonary disease.   Electronically Signed   By: Burman Nieves M.D.   On: 05/12/2013 01:57     EKG Interpretation None      MDM   Final diagnoses:  Myalgia   ED work up is non-diagnostic and reassuring with norma. U/A, negative urine preg, normal CXR, BMP and CBC. Patient counseled re: results of diagnostic studies, normal exam and normal VS. She is counseled to f/u with her PCP should sx persist. No treatment indicated at this time.     Brandt Loosen, MD 05/12/13 [phone]

## 2013-05-12 NOTE — ED Notes (Signed)
Patient remains upset after seeing MD. States she is going to leave and doesn't need to wait for discharge paperwork. Patient signed for discharge but declined discharge vs.

## 2013-05-12 NOTE — ED Notes (Signed)
Called to patient bedside. Patient upset at having not been seen by provider since arrival and intending to leave. Apologies made for wait time and delay. MD Lavella LemonsManly informed of patient's concerns and intention.

## 2013-05-12 NOTE — ED Notes (Signed)
Pt c/o body aches, headaches, nausea, and weakness for the past couple of days. Denies v/d. Pt is A&Ox4, respirations equal and unlabored, skin warm and dry.

## 2013-07-27 ENCOUNTER — Encounter (HOSPITAL_COMMUNITY): Payer: Self-pay | Admitting: Emergency Medicine

## 2013-07-27 ENCOUNTER — Emergency Department (HOSPITAL_COMMUNITY)
Admission: EM | Admit: 2013-07-27 | Discharge: 2013-07-27 | Disposition: A | Discharge status: Home / Self Care | Payer: BC Managed Care – PPO | Source: Home / Self Care

## 2013-07-27 DIAGNOSIS — J3089 Other allergic rhinitis: Secondary | ICD-10-CM

## 2013-07-27 DIAGNOSIS — F17200 Nicotine dependence, unspecified, uncomplicated: Secondary | ICD-10-CM

## 2013-07-27 DIAGNOSIS — Z720 Tobacco use: Secondary | ICD-10-CM

## 2013-07-27 DIAGNOSIS — J029 Acute pharyngitis, unspecified: Secondary | ICD-10-CM

## 2013-07-27 LAB — POCT RAPID STREP A: Streptococcus, Group A Screen (Direct): NEGATIVE

## 2013-07-27 NOTE — Discharge Instructions (Signed)
Allergic Rhinitis Sudafed PE 10 mg for congestion Allegra 180 mg for drainage flonase nasal spray Lots of saline nasal spray Drink lots of fluids. Stop smoking Allergic rhinitis is when the mucous membranes in the nose respond to allergens. Allergens are particles in the air that cause your body to have an allergic reaction. This causes you to release allergic antibodies. Through a chain of events, these eventually cause you to release histamine into the blood stream. Although meant to protect the body, it is this release of histamine that causes your discomfort, such as frequent sneezing, congestion, and an itchy, runny nose.  CAUSES  Seasonal allergic rhinitis (hay fever) is caused by pollen allergens that may come from grasses, trees, and weeds. Year-round allergic rhinitis (perennial allergic rhinitis) is caused by allergens such as house dust mites, pet dander, and mold spores.  SYMPTOMS   Nasal stuffiness (congestion).  Itchy, runny nose with sneezing and tearing of the eyes. DIAGNOSIS  Your health care provider can help you determine the allergen or allergens that trigger your symptoms. If you and your health care provider are unable to determine the allergen, skin or blood testing may be used. TREATMENT  Allergic rhinitis does not have a cure, but it can be controlled by:  Medicines and allergy shots (immunotherapy).  Avoiding the allergen. Hay fever may often be treated with antihistamines in pill or nasal spray forms. Antihistamines block the effects of histamine. There are over-the-counter medicines that may help with nasal congestion and swelling around the eyes. Check with your health care provider before taking or giving this medicine.  If avoiding the allergen or the medicine prescribed do not work, there are many new medicines your health care provider can prescribe. Stronger medicine may be used if initial measures are ineffective. Desensitizing injections can be used if  medicine and avoidance does not work. Desensitization is when a patient is given ongoing shots until the body becomes less sensitive to the allergen. Make sure you follow up with your health care provider if problems continue. HOME CARE INSTRUCTIONS It is not possible to completely avoid allergens, but you can reduce your symptoms by taking steps to limit your exposure to them. It helps to know exactly what you are allergic to so that you can avoid your specific triggers. SEEK MEDICAL CARE IF:   You have a fever.  You develop a cough that does not stop easily (persistent).  You have shortness of breath.  You start wheezing.  Symptoms interfere with normal daily activities. Document Released: 10/06/2000 Document Revised: 01/16/2013 Document Reviewed: 09/18/2012 [facility] Patient Information 2015 [place], [place]. This information is not intended to replace advice given to you by your health care provider. Make sure you discuss any questions you have with your health care provider.  Pharyngitis Pharyngitis is redness, pain, and swelling (inflammation) of your pharynx.  CAUSES  Pharyngitis is usually caused by infection. Most of the time, these infections are from viruses (viral) and are part of a cold. However, sometimes pharyngitis is caused by bacteria (bacterial). Pharyngitis can also be caused by allergies. Viral pharyngitis may be spread from person to person by coughing, sneezing, and personal items or utensils (cups, forks, spoons, toothbrushes). Bacterial pharyngitis may be spread from person to person by more intimate contact, such as kissing.  SIGNS AND SYMPTOMS  Symptoms of pharyngitis include:   Sore throat.   Tiredness (fatigue).   Low-grade fever.   Headache.  Joint pain and muscle aches.  Skin rashes.  Swollen  lymph nodes.  Plaque-like film on throat or tonsils (often seen with bacterial pharyngitis). DIAGNOSIS  Your health care provider will ask you questions  about your illness and your symptoms. Your medical history, along with a physical exam, is often all that is needed to diagnose pharyngitis. Sometimes, a rapid strep test is done. Other lab tests may also be done, depending on the suspected cause.  TREATMENT  Viral pharyngitis will usually get better in 3-4 days without the use of medicine. Bacterial pharyngitis is treated with medicines that kill germs (antibiotics).  HOME CARE INSTRUCTIONS   Drink enough water and fluids to keep your urine clear or pale yellow.   Only take over-the-counter or prescription medicines as directed by your health care provider:   If you are prescribed antibiotics, make sure you finish them even if you start to feel better.   Do not take aspirin.   Get lots of rest.   Gargle with 8 oz of salt water ( tsp of salt per 1 qt of water) as often as every 1-2 hours to soothe your throat.   Throat lozenges (if you are not at risk for choking) or sprays may be used to soothe your throat. SEEK MEDICAL CARE IF:   You have large, tender lumps in your neck.  You have a rash.  You cough up green, yellow-brown, or bloody spit. SEEK IMMEDIATE MEDICAL CARE IF:   Your neck becomes stiff.  You drool or are unable to swallow liquids.  You vomit or are unable to keep medicines or liquids down.  You have severe pain that does not go away with the use of recommended medicines.  You have trouble breathing (not caused by a stuffy nose). MAKE SURE YOU:   Understand these instructions.  Will watch your condition.  Will get help right away if you are not doing well or get worse. Document Released: 01/11/2005 Document Revised: 11/01/2012 Document Reviewed: 09/18/2012 [facility] Patient Information 2015 [place], [place]. This information is not intended to replace advice given to you by your health care provider. Make sure you discuss any questions you have with your health care provider.  Sore Throat A sore throat  is a painful, burning, sore, or scratchy feeling of the throat. There may be pain or tenderness when swallowing or talking. You may have other symptoms with a sore throat. These include coughing, sneezing, fever, or a swollen neck. A sore throat is often the first sign of another sickness. These sicknesses may include a cold, flu, strep throat, or an infection called mono. Most sore throats go away without medical treatment.  HOME CARE   Only take medicine as told by your doctor.  Drink enough fluids to keep your pee (urine) clear or pale yellow.  Rest as needed.  Try using throat sprays, lozenges, or suck on hard candy (if older than 4 years or as told).  Sip warm liquids, such as broth, herbal tea, or warm water with honey. Try sucking on frozen ice pops or drinking cold liquids.  Rinse the mouth (gargle) with salt water. Mix 1 teaspoon salt with 8 ounces of water.  Do not smoke. Avoid being around others when they are smoking.  Put a humidifier in your bedroom at night to moisten the air. You can also turn on a hot shower and sit in the bathroom for 5-10 minutes. Be sure the bathroom door is closed. GET HELP RIGHT AWAY IF:   You have trouble breathing.  You cannot swallow fluids,  soft foods, or your spit (saliva).  You have more puffiness (swelling) in the throat.  Your sore throat does not get better in 7 days.  You feel sick to your stomach (nauseous) and throw up (vomit).  You have a fever or lasting symptoms for more than 2-3 days.  You have a fever and your symptoms suddenly get worse. MAKE SURE YOU:   Understand these instructions.  Will watch your condition.  Will get help right away if you are not doing well or get worse. Document Released: 10/21/2007 Document Revised: 10/06/2011 Document Reviewed: 09/19/2011 [facility] Patient Information 2015 [place], [place]. This information is not intended to replace advice given to you by your health care provider. Make sure you  discuss any questions you have with your health care provider.

## 2013-07-27 NOTE — ED Notes (Signed)
C/o sore throat.  No fever.  Symptoms present for several days.

## 2013-07-27 NOTE — ED Provider Notes (Signed)
[name]: [id number]     Arrival date & time 07/27/13  1611 History   First MD Initiated Contact with Patient 07/27/13 1703     Chief Complaint  Patient presents with  . Sore Throat   (Consider location/radiation/quality/duration/timing/severity/associated sxs/prior Treatment) HPI Comments: 33 year old female complaining of sore throat for 2 days. She also has nasal congestion with yellow nasal drainage, some tainted with blood.. Nasal stuffiness. Her temperature has been approximately 99 at times.   Past Medical History  Diagnosis Date  . No pertinent past medical history   . Anxiety     as teen  . Abnormal Pap smear   . Depression     as teen  . VBAC, delivered, current hospitalization 05/19/2011  . Headache(784.0)   . GERD (gastroesophageal reflux disease)    Past Surgical History  Procedure Laterality Date  . Cesarean section    . Colposcopy    . Wisdom tooth extraction    . Dilation and curettage of uterus  2010   Family History  Problem Relation Age of Onset  . Hypertension Mother   . Seizures Mother   . Hypertension Maternal Grandmother   . Arthritis Maternal Grandmother   . Cancer Maternal Grandmother     breast  . Stroke Maternal Grandfather   . Seizures Brother   . Mental illness Brother     paranoid schizophrenia  . Birth defects Daughter     hole in heart  . Seizures Maternal Aunt   . Seizures Brother   . Seizures Cousin   . Seizures Cousin   . Mental retardation Cousin    History  Substance Use Topics  . Smoking status: Current Every Day Smoker -- 1.00 packs/day for 13 years  . Smokeless tobacco: Never Used  . Alcohol Use: Yes     Comment: rare   OB History   Grav Para Term Preterm Abortions TAB SAB Ect Mult Living   7 6 4 2 1  0 1 0 0 6     Review of Systems  Constitutional: Positive for fever. Negative for chills, activity change, appetite change and fatigue.  HENT: Positive for congestion, postnasal drip, rhinorrhea and sore throat. Negative  for facial swelling.   Eyes: Negative.   Respiratory: Negative.  Negative for chest tightness and shortness of breath.   Cardiovascular: Negative.   Gastrointestinal: Negative.   Musculoskeletal: Negative for neck pain and neck stiffness.  Skin: Negative for pallor and rash.  Neurological: Negative.     Allergies  Review of patient's allergies indicates no known allergies.  Home Medications   Prior to Admission medications   Medication Sig Start Date End Date Taking? Authorizing Provider  doxycycline (VIBRAMYCIN) 100 MG capsule Take 1 capsule (100 mg total) by mouth 2 (two) times daily. 05/08/13   Graylon GoodZachary H Baker, PA-C  ibuprofen (ADVIL,MOTRIN) 200 MG tablet Take 200 mg by mouth every 6 (six) hours as needed for pain.    Historical Provider, MD   BP 109/70  Pulse 85  Temp(Src) 98.6 F (37 C) (Oral)  Resp 16  SpO2 99%  LMP 06/27/2013 Physical Exam  Nursing note and vitals reviewed. Constitutional: She is oriented to person, place, and time. She appears well-developed and well-nourished. No distress.  HENT:  Mouth/Throat: No oropharyngeal exudate.  Bilat TM's with retraction, no erythema or effusion OP with mild streaky erythema. No thick or purulent PND.  Eyes: Conjunctivae and EOM are normal.  Neck: Normal range of motion. Neck supple.  Cardiovascular: Normal rate, regular  rhythm and normal heart sounds.   Pulmonary/Chest: Effort normal and breath sounds normal. No respiratory distress. She has no wheezes.  Mild coarseness at bases, no rales  Musculoskeletal: Normal range of motion. She exhibits no edema.  Lymphadenopathy:    She has no cervical adenopathy.  Neurological: She is alert and oriented to person, place, and time.  Skin: Skin is warm and dry. No rash noted.  Psychiatric: She has a normal mood and affect.    ED Course  Procedures (including critical care time) Labs Review Labs Reviewed  POCT RAPID STREP A ([facility] CARE ONLY)    Imaging Review No results  found.   MDM   1. Other allergic rhinitis   2. Allergic pharyngitis   3. Tobacco abuse disorder      Sudafed PE 10 mg for congestion Allegra 180 mg for drainage flonase nasal spray Lots of saline nasal spray Drink lots of fluids. Stop smoking  Hayden Rasmussenavid Ahmadou Bolz, NP 07/27/13 1744

## 2013-07-28 NOTE — ED Provider Notes (Signed)
Medical screening examination/treatment/procedure(s) were performed by non-physician practitioner and as supervising physician I was immediately available for consultation/collaboration.  Yena Tisby, M.D.   Desiree Fleming C Eleonora Peeler, MD 07/28/13 0830 

## 2013-07-29 LAB — CULTURE, GROUP A STREP

## 2013-08-20 ENCOUNTER — Encounter (HOSPITAL_COMMUNITY): Payer: Self-pay | Admitting: Emergency Medicine

## 2013-08-20 ENCOUNTER — Emergency Department (INDEPENDENT_AMBULATORY_CARE_PROVIDER_SITE_OTHER)
Admission: EM | Admit: 2013-08-20 | Discharge: 2013-08-20 | Disposition: A | Discharge status: Home / Self Care | Payer: Self-pay | Source: Home / Self Care | Attending: Emergency Medicine | Admitting: Emergency Medicine

## 2013-08-20 ENCOUNTER — Emergency Department (HOSPITAL_COMMUNITY)
Admission: EM | Admit: 2013-08-20 | Discharge: 2013-08-20 | Disposition: A | Discharge status: Home / Self Care | Payer: BC Managed Care – PPO | Attending: Emergency Medicine | Admitting: Emergency Medicine

## 2013-08-20 DIAGNOSIS — Z8659 Personal history of other mental and behavioral disorders: Secondary | ICD-10-CM | POA: Diagnosis not present

## 2013-08-20 DIAGNOSIS — J3489 Other specified disorders of nose and nasal sinuses: Secondary | ICD-10-CM | POA: Diagnosis present

## 2013-08-20 DIAGNOSIS — J029 Acute pharyngitis, unspecified: Secondary | ICD-10-CM

## 2013-08-20 DIAGNOSIS — J039 Acute tonsillitis, unspecified: Secondary | ICD-10-CM | POA: Diagnosis not present

## 2013-08-20 DIAGNOSIS — Z8719 Personal history of other diseases of the digestive system: Secondary | ICD-10-CM | POA: Insufficient documentation

## 2013-08-20 DIAGNOSIS — F17200 Nicotine dependence, unspecified, uncomplicated: Secondary | ICD-10-CM | POA: Insufficient documentation

## 2013-08-20 LAB — RAPID STREP SCREEN (MED CTR MEBANE ONLY): Streptococcus, Group A Screen (Direct): NEGATIVE

## 2013-08-20 MED ORDER — AMOXICILLIN 500 MG PO CAPS: 500.0000 mg | ORAL_CAPSULE | Freq: Two times a day (BID) | ORAL | Status: DC

## 2013-08-20 MED ORDER — PREDNISONE 20 MG PO TABS: 40.0000 mg | ORAL_TABLET | Freq: Every day | ORAL | Status: DC

## 2013-08-20 MED ORDER — IBUPROFEN 600 MG PO TABS: 600.0000 mg | ORAL_TABLET | Freq: Four times a day (QID) | ORAL | Status: DC | PRN

## 2013-08-20 NOTE — ED Provider Notes (Signed)
[name]: [id number]     Arrival date & time 08/20/13  [id number] History   First MD Initiated Contact with Patient 08/20/13 [phone]     Chief Complaint  Patient presents with  . Sore Throat   (Consider location/radiation/quality/duration/timing/severity/associated sxs/prior Treatment) HPI She is here today for evaluation of sore throat. She was seen last night, early this morning in the emergency room for the same symptoms. She states about 3-4 days ago she woke up with a sore throat. Initially it was more central, but progressed to involve her entire throat. She looked at her tonsils and saw an exudate. She does report some mild allergy symptoms, including nasal congestion, but this is not uncommon for her this time of year. She has a mild cough. She denies any fevers, however states she has been taking Advil. She is able to swallow liquids, however this is uncomfortable for her to do. She states she has had tonsillitis many times in the past, and always requires an antibiotic.  Past Medical History  Diagnosis Date  . No pertinent past medical history   . Anxiety     as teen  . Abnormal Pap smear   . Depression     as teen  . VBAC, delivered, current hospitalization 05/19/2011  . Headache(784.0)   . GERD (gastroesophageal reflux disease)    Past Surgical History  Procedure Laterality Date  . Cesarean section    . Colposcopy    . Wisdom tooth extraction    . Dilation and curettage of uterus  2010   Family History  Problem Relation Age of Onset  . Hypertension Mother   . Seizures Mother   . Hypertension Maternal Grandmother   . Arthritis Maternal Grandmother   . Cancer Maternal Grandmother     breast  . Stroke Maternal Grandfather   . Seizures Brother   . Mental illness Brother     paranoid schizophrenia  . Birth defects Daughter     hole in heart  . Seizures Maternal Aunt   . Seizures Brother   . Seizures Cousin   . Seizures Cousin   . Mental retardation Cousin    History   Substance Use Topics  . Smoking status: Current Every Day Smoker -- 1.00 packs/day for 13 years    Types: Cigarettes  . Smokeless tobacco: Never Used  . Alcohol Use: Yes     Comment: rare   OB History   Grav Para Term Preterm Abortions TAB SAB Ect Mult Living   7 6 4 2 1  0 1 0 0 6     Review of Systems  Constitutional: Negative for fever and chills.  HENT: Positive for congestion, ear pain, sore throat and voice change. Negative for rhinorrhea.   Respiratory: Positive for cough. Negative for shortness of breath.   Gastrointestinal: Negative.   Skin: Negative for rash.    Allergies  Review of patient's allergies indicates no known allergies.  Home Medications   Prior to Admission medications   Medication Sig Start Date End Date Taking? Authorizing Provider  amoxicillin (AMOXIL) 500 MG capsule Take 1 capsule (500 mg total) by mouth 2 (two) times daily. 08/20/13   Charm RingsErin J Nadira Single, MD  ibuprofen (ADVIL,MOTRIN) 600 MG tablet Take 1 tablet (600 mg total) by mouth every 6 (six) hours as needed. 08/20/13   Antony MaduraKelly Humes, PA-C  predniSONE (DELTASONE) 20 MG tablet Take 2 tablets (40 mg total) by mouth daily. 08/20/13   Antony MaduraKelly Humes, PA-C   BP 123/74  Pulse  69  Temp(Src) 98.5 F (36.9 C) (Oral)  Resp 16  Ht 5' 3.5" (1.613 m)  Wt 139 lb (63.05 kg)  BMI 24.23 kg/m2  SpO2 100%  LMP 08/15/2013 Physical Exam  Constitutional: She is oriented to person, place, and time. She appears well-developed and well-nourished. No distress.  HENT:  Head: Normocephalic and atraumatic.  Right Ear: External ear normal. Tympanic membrane is erythematous. Tympanic membrane is not bulging.  Left Ear: External ear normal. Tympanic membrane is erythematous. Tympanic membrane is not bulging.  Mouth/Throat: Mucous membranes are normal. Posterior oropharyngeal erythema present. No oropharyngeal exudate.  Eyes: Conjunctivae are normal. Right eye exhibits no discharge. Left eye exhibits no discharge.  Neck: Normal  range of motion. Neck supple.  Cardiovascular: Normal rate, regular rhythm and normal heart sounds.  Exam reveals no friction rub.   No murmur heard. Pulmonary/Chest: Effort normal and breath sounds normal. No respiratory distress. She has no wheezes. She has no rales.  Lymphadenopathy:    She has no cervical adenopathy.  Neurological: She is alert and oriented to person, place, and time.  Skin: Skin is warm and dry. No rash noted.    ED Course  Procedures (including critical care time) Labs Review Labs Reviewed - No data to display  Imaging Review No results found.   MDM   1. Pharyngitis    I suspect viral pharyngitis over bacterial. Discussed this extensively with the patient. I encouraged her to fill the prescription for prednisone, as that will help with the pain. However, she states she will not fill that prescription. Discussed symptomatic care including hot/cold liquids, Chloraseptic spray, salt water gargles. I did provide her a prescription for amoxicillin to fill if she is not improving in the next 2 days. Followup as needed.    Charm Rings, MD 08/20/13 [phone]

## 2013-08-20 NOTE — ED Provider Notes (Signed)
Medical screening examination/treatment/procedure(s) were performed by non-physician practitioner and as supervising physician I was immediately available for consultation/collaboration.   EKG Interpretation None        Danayah Smyre, MD 08/20/13 0836 

## 2013-08-20 NOTE — ED Notes (Signed)
Pt states she's had a sore throat for the past two days.  Pt reports hx of tonsillitis.  Pt states she can feel her tonsils have swollen up and pt has noted white stuff on them.  Pt reports she had a dizzy spell earlier this evening.  Pt is able to speak in complete sentences with any difficulty.  Pt's family states her voice sounds more scratchy.

## 2013-08-20 NOTE — ED Provider Notes (Signed)
[name]: [id number]     Arrival date & time 08/20/13  0126 History   First MD Initiated Contact with Patient 08/20/13 0201     Chief Complaint  Patient presents with  . Sore Throat     (Consider location/radiation/quality/duration/timing/severity/associated sxs/prior Treatment) HPI Comments: Patient is a 33 year old female with a history of depression and anxiety who presents to the emergency Department for sore throat x2 days. Patient states the symptoms have been worsening since onset. She states she has taken ibuprofen for symptoms without relief. She states that her tonsils feel swollen. She also states that she noticed some white exudates on them. Patient endorses a history of tonsillitis. Symptoms preceded by nasal congestion and postnasal drip. Patient has also noted a slight cough. She denies associated fever, inability to swallow, drooling, neck stiffness, shortness of breath.  Patient is a 33 y.o. female presenting with pharyngitis. The history is provided by the patient. No language interpreter was used.  Sore Throat Associated symptoms include congestion and a sore throat. Pertinent negatives include no fever.    Past Medical History  Diagnosis Date  . No pertinent past medical history   . Anxiety     as teen  . Abnormal Pap smear   . Depression     as teen  . VBAC, delivered, current hospitalization 05/19/2011  . Headache(784.0)   . GERD (gastroesophageal reflux disease)    Past Surgical History  Procedure Laterality Date  . Cesarean section    . Colposcopy    . Wisdom tooth extraction    . Dilation and curettage of uterus  2010   Family History  Problem Relation Age of Onset  . Hypertension Mother   . Seizures Mother   . Hypertension Maternal Grandmother   . Arthritis Maternal Grandmother   . Cancer Maternal Grandmother     breast  . Stroke Maternal Grandfather   . Seizures Brother   . Mental illness Brother     paranoid schizophrenia  . Birth defects Daughter      hole in heart  . Seizures Maternal Aunt   . Seizures Brother   . Seizures Cousin   . Seizures Cousin   . Mental retardation Cousin    History  Substance Use Topics  . Smoking status: Current Every Day Smoker -- 1.00 packs/day for 13 years    Types: Cigarettes  . Smokeless tobacco: Never Used  . Alcohol Use: Yes     Comment: rare   OB History   Grav Para Term Preterm Abortions TAB SAB Ect Mult Living   7 6 4 2 1  0 1 0 0 6      Review of Systems  Constitutional: Negative for fever.  HENT: Positive for congestion, postnasal drip and sore throat. Negative for trouble swallowing.   Respiratory: Negative for shortness of breath.   All other systems reviewed and are negative.    Allergies  Review of patient's allergies indicates no known allergies.  Home Medications   Prior to Admission medications   Medication Sig Start Date End Date Taking? Authorizing Provider  ibuprofen (ADVIL,MOTRIN) 600 MG tablet Take 1 tablet (600 mg total) by mouth every 6 (six) hours as needed. 08/20/13   Antony MaduraKelly Martita Brumm, PA-C  predniSONE (DELTASONE) 20 MG tablet Take 2 tablets (40 mg total) by mouth daily. 08/20/13   Antony MaduraKelly Rosanne Wohlfarth, PA-C   BP 107/69  Pulse 55  Temp(Src) 98.3 F (36.8 C) (Oral)  Resp 18  SpO2 98%  LMP 08/15/2013  Physical Exam  Nursing note and vitals reviewed. Constitutional: She is oriented to person, place, and time. She appears well-developed and well-nourished. No distress.  Nontoxic/nonseptic appearing  HENT:  Head: Normocephalic and atraumatic.  Right Ear: Hearing, tympanic membrane, external ear and ear canal normal.  Left Ear: Hearing, tympanic membrane, external ear and ear canal normal.  Vision with mild tonsillar enlargement, mildly more so on the left vs right. Small amount of punctate exudates to L tonsil. Uvula midline without evidence of peritonsillar abscess. Oropharynx clear. Patient tolerating secretions without difficulty or drooling. No hoarseness to voice.   Eyes: Conjunctivae and EOM are normal. No scleral icterus.  Neck: Normal range of motion. Neck supple.  No nuchal rigidity or meningismus. No stridor.  Cardiovascular: Normal rate, regular rhythm and normal heart sounds.   Pulmonary/Chest: Effort normal and breath sounds normal. No stridor. No respiratory distress. She has no wheezes. She has no rales.  Chest expansion symmetric. No tachypnea or dyspnea.   Musculoskeletal: Normal range of motion.  Neurological: She is alert and oriented to person, place, and time. She exhibits normal muscle tone. Coordination normal.  Skin: Skin is warm and dry. No rash noted. She is not diaphoretic. No erythema. No pallor.  Psychiatric: She has a normal mood and affect. Her behavior is normal.    ED Course  Procedures (including critical care time) Labs Review Labs Reviewed  RAPID STREP SCREEN  CULTURE, GROUP A STREP   Imaging Review No results found.   EKG Interpretation None      MDM   Final diagnoses:  Tonsillitis    Patient with sore throat x 2 days. She is afebrile with mild, punctate tonsillar exudate appreciated on L side, negative strep. Presents with mild cervical lymphadenopathy and dysphagia; diagnosis of viral pharyngitis. No abx indicated. Will discharge with symptomatic tx for pain  Pt does not appear dehydrated. Presentation non concerning for PTA or infxn spread to soft tissue. No trismus or uvula deviation. Patient tolerating secretions without difficulty. Advice primary care followup and have given referral to ENT and endorsed history of tonsillitis. Return precautions provided.  Notified by nurse the patient left without discharge papers. Patient frustrated with medications for symptom management. She claimed that she could not take prednisone or ibuprofen; however, patient did endorse taking ibuprofen for to arrival in the emergency department. No listed allergy to prednisone.   Filed Vitals:   08/20/13 0137 08/20/13 0329   BP: 122/73 107/69  Pulse: 70 55  Temp: 98 F (36.7 C) 98.3 F (36.8 C)  TempSrc: Oral Oral  Resp: 18 18  SpO2: 99% 98%       Antony Madura, PA-C 08/20/13 [phone]

## 2013-08-20 NOTE — Discharge Instructions (Signed)
You have pharyngitis or tonsillitis. I suspect this is caused by a virus rather than a bacteria. The prednisone will help with the pain. Drink plenty of fluids - hot or cold things will feel best. Use chloroseptic spray as needed. Do salt water gargles 2-3 times a day.  I have provided you with a prescription for an antibiotic.  If you are not improving by Wednesday, I would go ahead and get it filled. Follow up as needed.

## 2013-08-20 NOTE — Discharge Instructions (Signed)
Your rapid strep test was negative today. Recommend ibuprofen and over-the-counter Chloraseptic spray for symptoms. Take prednisone as prescribed for swelling. Followup with an ear nose and throat doctor as needed if symptoms persist.  Tonsillitis Tonsillitis is an infection of the throat that causes the tonsils to become red, tender, and swollen. Tonsils are collections of lymphoid tissue at the back of the throat. Each tonsil has crevices (crypts). Tonsils help fight nose and throat infections and keep infection from spreading to other parts of the body for the first 18 months of life.  CAUSES Sudden (acute) tonsillitis is usually caused by infection with streptococcal bacteria. Long-lasting (chronic) tonsillitis occurs when the crypts of the tonsils become filled with pieces of food and bacteria, which makes it easy for the tonsils to become repeatedly infected. SYMPTOMS  Symptoms of tonsillitis include:  A sore throat, with possible difficulty swallowing.  White patches on the tonsils.  Fever.  Tiredness.  New episodes of snoring during sleep, when you did not snore before.  Small, foul-smelling, yellowish-white pieces of material (tonsilloliths) that you occasionally cough up or spit out. The tonsilloliths can also cause you to have bad breath. DIAGNOSIS Tonsillitis can be diagnosed through a physical exam. Diagnosis can be confirmed with the results of lab tests, including a throat culture. TREATMENT  The goals of tonsillitis treatment include the reduction of the severity and duration of symptoms and prevention of associated conditions. Symptoms of tonsillitis can be improved with the use of steroids to reduce the swelling. Tonsillitis caused by bacteria can be treated with antibiotic medicines. Usually, treatment with antibiotic medicines is started before the cause of the tonsillitis is known. However, if it is determined that the cause is not bacterial, antibiotic medicines will not  treat the tonsillitis. If attacks of tonsillitis are severe and frequent, your health care provider may recommend surgery to remove the tonsils (tonsillectomy). HOME CARE INSTRUCTIONS   Rest as much as possible and get plenty of sleep.  Drink plenty of fluids. While the throat is very sore, eat soft foods or liquids, such as sherbet, soups, or instant breakfast drinks.  Eat frozen ice pops.  Gargle with a warm or cold liquid to help soothe the throat. Mix 1/4 teaspoon of salt and 1/4 teaspoon of baking soda in 8 oz of water. SEEK MEDICAL CARE IF:   Large, tender lumps develop in your neck.  A rash develops.  A green, yellow-brown, or bloody substance is coughed up.  You are unable to swallow liquids or food for 24 hours.  You notice that only one of the tonsils is swollen. SEEK IMMEDIATE MEDICAL CARE IF:   You develop any new symptoms such as vomiting, severe headache, stiff neck, chest pain, or trouble breathing or swallowing.  You have severe throat pain along with drooling or voice changes.  You have severe pain, unrelieved with recommended medications.  You are unable to fully open the mouth.  You develop redness, swelling, or severe pain anywhere in the neck.  You have a fever. MAKE SURE YOU:   Understand these instructions.  Will watch your condition.  Will get help right away if you are not doing well or get worse. Document Released: 10/21/2004 Document Revised: 05/28/2013 Document Reviewed: 06/30/2012 [facility] Patient Information 2015 [employer] [place]. This information is not intended to replace advice given to you by your health care provider. Make sure you discuss any questions you have with your health care provider.

## 2013-08-20 NOTE — ED Notes (Addendum)
Pt   Reports     sorethroat   With  Pain  When  Swallowing     Hurts  To talk  As  Well          Sitting upright on  Exam table  Speaking in  Complete  sentances  Left  Erica [name]  Long   A  Few  Hours  Ago       Had  Neg  Strep     -  Pt  Wants   An anti biotic

## 2013-08-20 NOTE — ED Notes (Signed)
Family at bedside. 

## 2013-08-20 NOTE — ED Notes (Addendum)
Pt left ED upset about prescription. States that she can't take Predinisone nor Advil (NKDA on file). She states she needed an ATB because she has tonsillitis and that's what has been given in the past.  Asked and offered pt to wait and speak with provider about prescriptions and she stated that we can have the prescriptions and that she was leaving.

## 2013-08-20 NOTE — ED Notes (Signed)
Pt c/o sore throat, progressing today. States she took Ibuprofen for pain earlier. Denies fever, but states she took ibuprofen earlier today. She states her husband had sore throat on yesterday. Denies cough. Did notice drainage from allergies.

## 2013-08-21 LAB — CULTURE, GROUP A STREP

## 2013-09-20 ENCOUNTER — Emergency Department (HOSPITAL_COMMUNITY)
Admission: EM | Admit: 2013-09-20 | Discharge: 2013-09-20 | Disposition: A | Discharge status: Home / Self Care | Payer: BC Managed Care – PPO | Attending: Emergency Medicine | Admitting: Emergency Medicine

## 2013-09-20 ENCOUNTER — Encounter (HOSPITAL_COMMUNITY): Payer: Self-pay | Admitting: Emergency Medicine

## 2013-09-20 DIAGNOSIS — Z792 Long term (current) use of antibiotics: Secondary | ICD-10-CM | POA: Diagnosis not present

## 2013-09-20 DIAGNOSIS — M542 Cervicalgia: Secondary | ICD-10-CM | POA: Insufficient documentation

## 2013-09-20 DIAGNOSIS — J029 Acute pharyngitis, unspecified: Secondary | ICD-10-CM | POA: Diagnosis not present

## 2013-09-20 DIAGNOSIS — F17200 Nicotine dependence, unspecified, uncomplicated: Secondary | ICD-10-CM | POA: Diagnosis not present

## 2013-09-20 DIAGNOSIS — Z8719 Personal history of other diseases of the digestive system: Secondary | ICD-10-CM | POA: Diagnosis not present

## 2013-09-20 DIAGNOSIS — Z9119 Patient's noncompliance with other medical treatment and regimen: Secondary | ICD-10-CM | POA: Insufficient documentation

## 2013-09-20 DIAGNOSIS — Z91199 Patient's noncompliance with other medical treatment and regimen due to unspecified reason: Secondary | ICD-10-CM | POA: Insufficient documentation

## 2013-09-20 DIAGNOSIS — IMO0002 Reserved for concepts with insufficient information to code with codable children: Secondary | ICD-10-CM | POA: Diagnosis not present

## 2013-09-20 DIAGNOSIS — Z8659 Personal history of other mental and behavioral disorders: Secondary | ICD-10-CM | POA: Insufficient documentation

## 2013-09-20 DIAGNOSIS — R51 Headache: Secondary | ICD-10-CM | POA: Diagnosis not present

## 2013-09-20 DIAGNOSIS — Z9114 Patient's other noncompliance with medication regimen: Secondary | ICD-10-CM

## 2013-09-20 MED ORDER — AMOXICILLIN 875 MG PO TABS: 875.0000 mg | ORAL_TABLET | Freq: Two times a day (BID) | ORAL | Status: DC

## 2013-09-20 NOTE — ED Notes (Signed)
Patient states she took antibiotics until throat started feeling better then quit.   Patient states she did this several times with last antibiotic.

## 2013-09-20 NOTE — ED Provider Notes (Signed)
Medical screening examination/treatment/procedure(s) were performed by non-physician practitioner and as supervising physician I was immediately available for consultation/collaboration.  Leslee Home, M.D.  Reuben Likes, MD 09/20/13 1100

## 2013-09-20 NOTE — Discharge Instructions (Signed)

## 2013-09-20 NOTE — ED Provider Notes (Signed)
[name]: [id number]     Arrival date & time 09/20/13  4098 History   First MD Initiated Contact with Patient 09/20/13 [phone]     Chief Complaint  Patient presents with  . Sore Throat     (Consider location/radiation/quality/duration/timing/severity/associated sxs/prior Treatment) HPI Comments: 23f presents for evaluation of sore throat.  She says she has a Hx of tonsillitis that she states always requires antibiotics.  She has had sore throat, swollen tonsils for 3 days.  She has taken ibuprofen at home for treatment.  She admits to associated pain in the left side of her head and face.  She also admits to having pharyngitis a month ago, she took the prescribed amoxicillin for 2 days until it felt better then stopped it, and has done that intermittently over the past month.  She denies NVD, f/c.     Past Medical History  Diagnosis Date  . No pertinent past medical history   . Anxiety     as teen  . Abnormal Pap smear   . Depression     as teen  . VBAC, delivered, current hospitalization 05/19/2011  . Headache(784.0)   . GERD (gastroesophageal reflux disease)    Past Surgical History  Procedure Laterality Date  . Cesarean section    . Colposcopy    . Wisdom tooth extraction    . Dilation and curettage of uterus  2010   Family History  Problem Relation Age of Onset  . Hypertension Mother   . Seizures Mother   . Hypertension Maternal Grandmother   . Arthritis Maternal Grandmother   . Cancer Maternal Grandmother     breast  . Stroke Maternal Grandfather   . Seizures Brother   . Mental illness Brother     paranoid schizophrenia  . Birth defects Daughter     hole in heart  . Seizures Maternal Aunt   . Seizures Brother   . Seizures Cousin   . Seizures Cousin   . Mental retardation Cousin    History  Substance Use Topics  . Smoking status: Current Every Day Smoker -- 1.00 packs/day for 13 years    Types: Cigarettes  . Smokeless tobacco: Never Used  . Alcohol Use: No   Comment: rare   OB History   Grav Para Term Preterm Abortions TAB SAB Ect Mult Living   0 1 0 0 6     Review of Systems  HENT: Positive for sore throat.   Musculoskeletal: Positive for neck pain.  Neurological: Positive for headaches (left side ).  All other systems reviewed and are negative.     Allergies  Review of patient's allergies indicates no known allergies.  Home Medications   Prior to Admission medications   Medication Sig Start Date End Date Taking? Authorizing Provider  amoxicillin (AMOXIL) 500 MG capsule Take 1 capsule (500 mg total) by mouth 2 (two) times daily. 08/20/13   Charm Rings, MD  amoxicillin (AMOXIL) 875 MG tablet Take 1 tablet (875 mg total) by mouth 2 (two) times daily. 09/20/13   Graylon Good, PA-C  ibuprofen (ADVIL,MOTRIN) 600 MG tablet Take 1 tablet (600 mg total) by mouth every 6 (six) hours as needed. 08/20/13   Antony Madura, PA-C  predniSONE (DELTASONE) 20 MG tablet Take 2 tablets (40 mg total) by mouth daily. 08/20/13   Antony Madura, PA-C   BP 111/66  Pulse 67  Temp(Src) 98 F (36.7 C) (Oral)  Resp 18  Ht  (  1.6 m)  Wt 140 lb (63.504 kg)  BMI 24.81 kg/m2  SpO2 100%  LMP 09/13/2013 Physical Exam  Nursing note and vitals reviewed. Constitutional: She is oriented to person, place, and time. Vital signs are normal. She appears well-developed and well-nourished. No distress.  HENT:  Head: Normocephalic and atraumatic.  Right Ear: Tympanic membrane, external ear and ear canal normal.  Left Ear: Tympanic membrane, external ear and ear canal normal.  Nose: Nose normal. Right sinus exhibits no maxillary sinus tenderness and no frontal sinus tenderness. Left sinus exhibits no maxillary sinus tenderness and no frontal sinus tenderness.  Mouth/Throat: Uvula is midline and mucous membranes are normal. Posterior oropharyngeal erythema (mild) present. No oropharyngeal exudate.  Neck: Normal range of motion. Neck supple.  Pulmonary/Chest:  Effort normal. No respiratory distress.  Neurological: She is alert and oriented to person, place, and time. She has normal strength. Coordination normal.  Skin: Skin is warm and dry. No rash noted. She is not diaphoretic.  Psychiatric: She has a normal mood and affect. Judgment normal.    ED Course  Procedures (including critical care time) Labs Review Labs Reviewed - No data to display  Imaging Review No results found.   EKG Interpretation None      MDM   Final diagnoses:  Pharyngitis  Noncompliance w/medication treatment due to intermit use of medication    Treat with amoxicillin.  Advised to take all of medication.  F/u PRN     Meds ordered this encounter  Medications  . amoxicillin (AMOXIL) 875 MG tablet    Sig: Take 1 tablet (875 mg total) by mouth 2 (two) times daily.    Dispense:  20 tablet    Refill:  0    Order Specific Question:  Supervising Provider    Answer:  Lorenz Coaster, DAVID C [[id number]]       Graylon Good, PA-C 09/20/13 [phone]

## 2013-10-05 ENCOUNTER — Other Ambulatory Visit (HOSPITAL_COMMUNITY): Payer: Self-pay | Admitting: Obstetrics and Gynecology

## 2013-10-05 DIAGNOSIS — R52 Pain, unspecified: Secondary | ICD-10-CM

## 2013-10-09 ENCOUNTER — Other Ambulatory Visit: Payer: Self-pay | Admitting: Obstetrics and Gynecology

## 2013-10-09 DIAGNOSIS — R1900 Intra-abdominal and pelvic swelling, mass and lump, unspecified site: Secondary | ICD-10-CM

## 2013-10-10 ENCOUNTER — Ambulatory Visit (HOSPITAL_COMMUNITY): Payer: BC Managed Care – PPO

## 2013-10-11 ENCOUNTER — Ambulatory Visit
Admission: RE | Admit: 2013-10-11 | Discharge: 2013-10-11 | Disposition: A | Discharge status: Home / Self Care | Payer: BC Managed Care – PPO | Source: Ambulatory Visit | Attending: Obstetrics and Gynecology | Admitting: Obstetrics and Gynecology

## 2013-10-11 DIAGNOSIS — R1900 Intra-abdominal and pelvic swelling, mass and lump, unspecified site: Secondary | ICD-10-CM

## 2013-12-10 ENCOUNTER — Emergency Department (HOSPITAL_COMMUNITY)
Admission: EM | Admit: 2013-12-10 | Discharge: 2013-12-11 | Disposition: A | Discharge status: Home / Self Care | Payer: BC Managed Care – PPO | Attending: Emergency Medicine | Admitting: Emergency Medicine

## 2013-12-10 ENCOUNTER — Encounter (HOSPITAL_COMMUNITY): Payer: Self-pay | Admitting: *Deleted

## 2013-12-10 DIAGNOSIS — R202 Paresthesia of skin: Secondary | ICD-10-CM | POA: Diagnosis not present

## 2013-12-10 DIAGNOSIS — Z8719 Personal history of other diseases of the digestive system: Secondary | ICD-10-CM | POA: Insufficient documentation

## 2013-12-10 DIAGNOSIS — Z792 Long term (current) use of antibiotics: Secondary | ICD-10-CM | POA: Diagnosis not present

## 2013-12-10 DIAGNOSIS — G245 Blepharospasm: Secondary | ICD-10-CM

## 2013-12-10 DIAGNOSIS — M791 Myalgia: Secondary | ICD-10-CM | POA: Diagnosis not present

## 2013-12-10 DIAGNOSIS — F439 Reaction to severe stress, unspecified: Secondary | ICD-10-CM

## 2013-12-10 DIAGNOSIS — F329 Major depressive disorder, single episode, unspecified: Secondary | ICD-10-CM | POA: Insufficient documentation

## 2013-12-10 DIAGNOSIS — R253 Fasciculation: Secondary | ICD-10-CM | POA: Diagnosis present

## 2013-12-10 DIAGNOSIS — R51 Headache: Secondary | ICD-10-CM | POA: Diagnosis not present

## 2013-12-10 DIAGNOSIS — Z7952 Long term (current) use of systemic steroids: Secondary | ICD-10-CM | POA: Diagnosis not present

## 2013-12-10 DIAGNOSIS — R110 Nausea: Secondary | ICD-10-CM | POA: Diagnosis not present

## 2013-12-10 DIAGNOSIS — Z720 Tobacco use: Secondary | ICD-10-CM | POA: Diagnosis not present

## 2013-12-10 NOTE — ED Provider Notes (Signed)
[name]: [id number]     Arrival date & time 12/10/13  2328 History  This chart was scribed for non-physician practitioner, Oswaldo ConroyVictoria Murphy Duzan, PA-C, working with Purvis SheffieldForrest Harrison, MD, by Modena JanskyAlbert Thayil, ED Scribe. This patient was seen in room TR08C/TR08C and the patient's care was started at 11:46 PM.   Chief Complaint  Patient presents with  . Eye Problem   The history is provided by the patient. No language interpreter was used.   HPI Comments: Erica [name] is a 33 y.o. female who presents to the Emergency Department complaining of left eye twitch that started weeks ago. She states that her eye twitches every couple of minutes, especially at work. She notes increased twitching in times of stress. She reports that she also felt an intermittent burning sensation with tingling in her left arm that started two days ago with no modifying factors. No numbness or weakness. She states that she had no recent trauma to left eye or arm. She reports that she is concerned about a stroke. Pt notes intermittent tingling in her legs and arms without numbness or weakness. Pt also noted occasional unilateral eye pain not associated with visual loss. Pt completely asymptomatic in ED. She states that she has been having an intermittent moderate headache with a pressure sensation that comes and goes, develops gradually and is like other headaches she has had. She denies any visual disturbance, eye discharge, slurred speech, weakness, new abdominal pain, fever, chills, neck pain, rhinorrhea, or sore throat, nausea, vomiting. No fevers, chills or neck stiffness.  No PCP Past Medical History  Diagnosis Date  . No pertinent past medical history   . Anxiety     as teen  . Abnormal Pap smear   . Depression     as teen  . VBAC, delivered, current hospitalization 05/19/2011  . Headache(784.0)   . GERD (gastroesophageal reflux disease)    Past Surgical History  Procedure Laterality Date  . Cesarean section    . Colposcopy     . Wisdom tooth extraction    . Dilation and curettage of uterus  2010   Family History  Problem Relation Age of Onset  . Hypertension Mother   . Seizures Mother   . Hypertension Maternal Grandmother   . Arthritis Maternal Grandmother   . Cancer Maternal Grandmother     breast  . Stroke Maternal Grandfather   . Seizures Brother   . Mental illness Brother     paranoid schizophrenia  . Birth defects Daughter     hole in heart  . Seizures Maternal Aunt   . Seizures Brother   . Seizures Cousin   . Seizures Cousin   . Mental retardation Cousin    History  Substance Use Topics  . Smoking status: Current Every Day Smoker -- 1.00 packs/day for 13 years    Types: Cigarettes  . Smokeless tobacco: Never Used  . Alcohol Use: No     Comment: rare   OB History    Gravida Para Term Preterm AB TAB SAB Ectopic Multiple Living   7 6 4 2 1  0 1 0 0 6     Review of Systems  Constitutional: Negative for fever and chills.  HENT: Negative for rhinorrhea and sore throat.   Eyes: Negative for discharge and visual disturbance.  Gastrointestinal: Positive for nausea. Negative for abdominal pain.  Musculoskeletal: Positive for myalgias. Negative for neck pain.  Neurological: Positive for headaches. Negative for speech difficulty and weakness.  All other systems reviewed  and are negative.   Allergies  Review of patient's allergies indicates no known allergies.  Home Medications   Prior to Admission medications   Medication Sig Start Date End Date Taking? Authorizing Provider  amoxicillin (AMOXIL) 500 MG capsule Take 1 capsule (500 mg total) by mouth 2 (two) times daily. 08/20/13   Charm RingsErin J Honig, MD  amoxicillin (AMOXIL) 875 MG tablet Take 1 tablet (875 mg total) by mouth 2 (two) times daily. 09/20/13   Graylon GoodZachary H Baker, PA-C  ibuprofen (ADVIL,MOTRIN) 600 MG tablet Take 1 tablet (600 mg total) by mouth every 6 (six) hours as needed. 08/20/13   Antony MaduraKelly Humes, PA-C  predniSONE (DELTASONE) 20 MG  tablet Take 2 tablets (40 mg total) by mouth daily. 08/20/13   Antony MaduraKelly Humes, PA-C   BP 143/72 mmHg  Pulse 78  Temp(Src) 98.4 F (36.9 C) (Oral)  Resp 18  Ht 5\' 3"  (1.6 m)  Wt 140 lb (63.504 kg)  BMI 24.81 kg/m2  SpO2 100%  LMP 11/26/2013 Physical Exam  Constitutional: She appears well-developed and well-nourished. No distress.  No eye twitching noted.  HENT:  Head: Normocephalic and atraumatic.  Mouth/Throat: Oropharynx is clear and moist.  Eyes: Conjunctivae and EOM are normal. Pupils are equal, round, and reactive to light. Right eye exhibits no discharge. Left eye exhibits no discharge.  Neck: Normal range of motion. Neck supple.  No nuchal rigidity  Cardiovascular: Normal rate and regular rhythm.   Pulmonary/Chest: Effort normal and breath sounds normal. No respiratory distress. She has no wheezes.  Abdominal: Soft. Bowel sounds are normal. She exhibits no distension. There is no tenderness.  Musculoskeletal:  No deformity of the left shoulder. 2+ radial pulses bilaterally. Full ROM of the left shoulder.   Neurological: She is alert. No cranial nerve deficit. Coordination normal.  Speech is clear and goal oriented. Peripheral visual fields intact. Strength 5/5 in upper and lower extremities. Sensation intact. Intact rapid alternating movements, finger to nose, and heel to shin. Negative Romberg. No pronator drift. Normal gait.   Skin: Skin is warm and dry. She is not diaphoretic.  Nursing note and vitals reviewed.   ED Course  Procedures (including critical care time) DIAGNOSTIC STUDIES: Oxygen Saturation is 100% on RA, normal by my interpretation.    COORDINATION OF CARE: 11:50 PM- Pt advised of plan for treatment and pt agrees.  Labs Review Labs Reviewed - No data to display  Imaging Review No results found.   EKG Interpretation None      MDM   Final diagnoses:  Eye twitch  Tingling in extremities  Stress   Pt with a history of anxiety with intermittent  eye twitching not noted in ED. Pt notes increase in symptoms in times of stress. This is likely due to stress and pt denies new medications that may be contributing. Pt also with history of intermittent tingling in extremities without numbness or weakness. Also unilateral eye pain at times without associated visual loss. Pt also with intermittent headaches that are not worse of life, maximal in onset or sudden in onset. Pt asymptomatic in ED. VSS. Completely benign neurological exam. I doubt CVA, ICH, SAH or meningitis. Concern for an atypical presentation of multiple sclerosis. Recommend further outpatient work up as patient is currently asymptomatic without focal neurological deficits. Pt given strict return precautions for signs of CVA or optic neuritis. Pt to follow up with neurology for further work up.  Discussed return precautions with patient. Discussed all results and patient verbalizes understanding and  agrees with plan.  I personally performed the services described in this documentation, which was scribed in my presence. The recorded information has been reviewed and is accurate.    Louann Sjogren, PA-C 12/11/13 1239  Dione Booze, MD 12/11/13 [phone]

## 2013-12-10 NOTE — ED Notes (Signed)
Pt c/o left eye twitching for over two weeks. Pt also reports left arm burning and pain two days ago. Pt reports an increase in stress at home.

## 2013-12-11 NOTE — Discharge Instructions (Signed)
Return to the emergency room with worsening of symptoms, new symptoms or with symptoms that are concerning, especially severe worsening of headache, visual or speech changes, weakness in face, arms or legs OR unilateral eye pain or visual loss or weakness. Call to make appointment with neurology as soon as possible.   Emergency Department Resource Guide 1) Find a Doctor and Pay Out of Pocket Although you won't have to find out who is covered by your insurance plan, it is a good idea to ask around and get recommendations. You will then need to call the office and see if the doctor you have chosen will accept you as a new patient and what types of options they offer for patients who are self-pay. Some doctors offer discounts or will set up payment plans for their patients who do not have insurance, but you will need to ask so you aren't surprised when you get to your appointment.  2) Contact Your Local Health Department Not all health departments have doctors that can see patients for sick visits, but many do, so it is worth a call to see if yours does. If you don't know where your local health department is, you can check in your phone book. The CDC also has a tool to help you locate your state's health department, and many state websites also have listings of all of their local health departments.  3) Find a Walk-in Clinic If your illness is not likely to be very severe or complicated, you may want to try a walk in clinic. These are popping up all over the country in pharmacies, drugstores, and shopping centers. They're usually staffed by nurse practitioners or physician assistants that have been trained to treat common illnesses and complaints. They're usually fairly quick and inexpensive. However, if you have serious medical issues or chronic medical problems, these are probably not your best option.  No Primary Care Doctor: - Call Health Connect at  [phone] - they can help you locate a primary  care doctor that  accepts your insurance, provides certain services, etc. - Physician Referral Service- [phone]  Chronic Pain Problems: Organization         Address  Phone   Notes  [facility]  [phone]) [phone] Patients need to be referred by their primary care doctor.   Medication Assistance: Organization         Address  Phone   Notes  [facility] [address] [place], [place] [address] [phone]) [phone] --Must be a resident of [facility] [place] -- Must have NO insurance coverage whatsoever (no Medicaid/ Medicare, etc.) -- The pt. MUST have a primary care doctor that directs their care regularly and follows them in the community   MedAssist  [phone]   [employer]  [phone]    Agencies that provide inexpensive medical care: Organization         Address  Phone   Notes  [facility]  [phone]   [facility]    [phone]   [facility] [address] [place], [place] [address] [phone]) [phone]   [facility] [address], [place] [phone]   [facility]anned [facility]    [phone]   [facility]    [phone]   [facility]  [address], [place] Phone:  [phone]) [phone], Fax:  [phone] Hours of Operation:  9 am - 6 pm, M-F.  Also accepts Medicaid/Medicare and self-pay.  [facility] [place], [address], [place] Phone: [phone], Fax: [phone]. Hours of Operation:  8:30 am - 5:30 pm, M-F.  Also accepts Medicaid and self-pay.  [facility] [place] [address] [place] Phone: [phone]   [place], [place], [place] [phone], Ext. [phone] Mondays & Thursdays: 7-9 AM.  First 15 patients are seen on a first come, first serve basis.    [place]  Providers:  Organization         Address  Phone   Notes  [facility] [address], [place] [phone] Also accepts self-pay patients.  [facility] [address], [place]  [phone]   [place], [address], [place] [phone]   [facility] [address] [place] [phone]   Lucianne Lei [address], [place]   [phone] Only accepts [place] Access [place] patients after they have their name applied to their card.   Self-Pay (no insurance) in [facility]:  Organization         Address  Phone   Notes  Sickle Cell Patients, [facility] [place] [phone]   [facility] [place] [phone]   [facility]  [place], [place],  [phone]   [facility]/Dr. Osei-Bonsu  [address] [place] or [address], [place] [phone] Phone number for both [place] and [place] locations is the same.  [facility] [address], [place] [phone]   [facility] [address] [place] or [address] [phone]   [facility] [address], [place] [phone], phone; [phone], fax Sees patients 1st and 3rd Saturday of every month.  Must not qualify for public or private insurance (i.e. Medicaid, Medicare, [place] Health Choice, Veterans' Benefits)  Household income should be no more than 200% of the poverty level The clinic cannot treat you if you are pregnant or think you are pregnant  Sexually transmitted diseases are not treated at the clinic.    Dental Care: Organization         Address  Phone  Notes  [facility] Department of [facility] [place] [phone] Accepts children up to age 12 who are enrolled in [place] or [place]; pregnant women with a Medicaid card; and children who have applied for Medicaid or [place] Health Choice, but were declined, whose parents can pay a reduced fee at time of service.  [facility] Department of [facility]  [address], [place] [phone] Accepts children up to age 24 who are enrolled in [place] or [place]; pregnant women with a Medicaid card; and children who have applied for Medicaid or [place] Health Choice, but were declined, whose parents can pay a reduced fee at time of service.  [facility] Access PRO[facility]  [place] [phone] Patients are seen by appointment only. Walk-ins are not accepted. [place] will see patients 18 years  of age and older. Monday - Tuesday (8am-5pm) Most Wednesdays (8:30-5pm) $30 per visit, cash only  [facility] Adult Dental Access PROGRAM  [address], [facility] [phone] Patients are seen by appointment only. Walk-ins are not accepted. [place] will see patients 43 years of age and older. One Wednesday Evening (Monthly: Volunteer Based).  $30 per visit, cash only  [place]  [phone] for adults; Children under age 81, call Graduate Pediatric Dentistry at [phone]. Children aged 70-14, please call [phone] to request a pediatric application.  Dental services are provided in all areas of dental care including fillings, crowns and bridges, complete and partial dentures, implants, gum treatment, root canals, and extractions. Preventive care is also provided. Treatment is provided to both adults and children. Patients are selected via a lottery and there is often a waiting list.   [facility] [address], [place]  [phone] [email or web address]   [facility]  [address] [place], [place] [phone], Ext. [phone] Second and Fourth Thursday of each month, opens at 6:30 AM; Clinic ends at 9 AM.  Patients are seen on a first-come first-served basis, and a limited number are seen during each clinic.   [facility]  [address] Hillard Danker [place], [place] [phone]   Eligibility Requirements You must have lived in [place], [place], or [place] counties for at least the last three months.   You cannot be eligible for state or federal sponsored [employer], including [employer], [place], or [employer].   You generally cannot be eligible for healthcare insurance through your employer.    How to apply: Eligibility screenings are held every Tuesday and Wednesday afternoon from 1:00 pm until 4:00 pm. You do not need an appointment for the interview!  [facility] [address], [place], [place]   [place]  [place] Department  [place]  [phone]    Behavioral Health Resources in the Community: Intensive Outpatient Programs Organization         Address  Phone  Notes  [place]. [address] [place], [place] [phone]   [facility] [address] [place], [place]   ADS: Alcohol & Drug Svcs [address] [place], [place]   [place] [address],  [place], [place] or [phone]   Substance Abuse Resources Organization         Address  Phone  Notes  Alcohol and Drug Services  [phone]   [place]  [phone]   The [place]   Chinita Pester  [phone]   Residential & Outpatient Substance Abuse Program  [phone]   Psychological Services Organization         Address  Phone  Notes  [facility] [place]  [place]  [phone]   [place] [address] [place] or [phone]    Mobile Crisis Teams Organization         Address  Phone  Notes  [facility], Mobile Crisis Care Unit  [phone]   [facility]  [address] [place], [place]   [facility] [address] [place] [phone]    Self-Help/Support Groups Organization  Address  Phone             Notes  [place]. of [place] - variety of support groups  [place] Call for more information  Narcotics Anonymous (NA), [facility] [address], [employer] [place]  2 meetings at this location   Special educational needs teacher         Address  Phone  Notes  [facility]AP Residential Treatment [place],    [place]  [phone]   [facility]  [address], [place] [phone], [place], [place]   [place], [place] [phone] Admissions: 8am-3pm M-F  Incentives Substance [place] [address],    [place], [place] [phone]   [facility] [address] [place], [place], [place]   [facility] [address],  [place], [place]   [facility] Programs - Intensive Outpatient [address], [place], [place]   [facility]  [place], [place] [phone] or [phone]   [facility] Treatment Services (RTS) [address] [place], [place] Accepts Medicaid  [facility] [place] [address]  [place] [phone] Substance Abuse/Addiction Treatment   [facility] Organization         Address  Phone  Notes  [facility]  [phone]   Domenic Schwab, PhD [address] Arlis Porta [place], [place]   [phone] or [phone]    [place], [place] [phone]   [facility] [address], [place], [place] [phone] Insurance/Medicaid/sponsorship through [facility] and Families [address], [address] [place]                                    [place], [place] [phone] [place] [address], [place] [phone]    Dr. Adele Schilder  [phone]   [facility] Dept. 1) [address], [place] 2) [place] 3)  [place] [address], [place] [phone]  [phone]   [place] [phone] or [phone] (After Hours)

## 2013-12-15 ENCOUNTER — Encounter (HOSPITAL_COMMUNITY): Payer: Self-pay | Admitting: Emergency Medicine

## 2013-12-15 ENCOUNTER — Emergency Department (HOSPITAL_COMMUNITY)
Admission: EM | Admit: 2013-12-15 | Discharge: 2013-12-15 | Disposition: A | Discharge status: Home / Self Care | Payer: BC Managed Care – PPO | Attending: Emergency Medicine | Admitting: Emergency Medicine

## 2013-12-15 DIAGNOSIS — F329 Major depressive disorder, single episode, unspecified: Secondary | ICD-10-CM | POA: Diagnosis not present

## 2013-12-15 DIAGNOSIS — F419 Anxiety disorder, unspecified: Secondary | ICD-10-CM | POA: Diagnosis not present

## 2013-12-15 DIAGNOSIS — J029 Acute pharyngitis, unspecified: Secondary | ICD-10-CM | POA: Insufficient documentation

## 2013-12-15 DIAGNOSIS — K219 Gastro-esophageal reflux disease without esophagitis: Secondary | ICD-10-CM | POA: Insufficient documentation

## 2013-12-15 DIAGNOSIS — R300 Dysuria: Secondary | ICD-10-CM | POA: Diagnosis not present

## 2013-12-15 DIAGNOSIS — Z720 Tobacco use: Secondary | ICD-10-CM | POA: Diagnosis not present

## 2013-12-15 DIAGNOSIS — Z792 Long term (current) use of antibiotics: Secondary | ICD-10-CM | POA: Insufficient documentation

## 2013-12-15 DIAGNOSIS — Z3202 Encounter for pregnancy test, result negative: Secondary | ICD-10-CM | POA: Insufficient documentation

## 2013-12-15 DIAGNOSIS — R1030 Lower abdominal pain, unspecified: Secondary | ICD-10-CM | POA: Diagnosis present

## 2013-12-15 DIAGNOSIS — Z7952 Long term (current) use of systemic steroids: Secondary | ICD-10-CM | POA: Insufficient documentation

## 2013-12-15 DIAGNOSIS — N898 Other specified noninflammatory disorders of vagina: Secondary | ICD-10-CM | POA: Diagnosis not present

## 2013-12-15 LAB — CBC WITH DIFFERENTIAL/PLATELET
BASOS PCT: 0 % (ref 0–1)
Basophils Absolute: 0 10*3/uL (ref 0.0–0.1)
EOS ABS: 0.1 10*3/uL (ref 0.0–0.7)
EOS PCT: 2 % (ref 0–5)
HCT: 39.1 % (ref 36.0–46.0)
Hemoglobin: 13.2 g/dL (ref 12.0–15.0)
LYMPHS ABS: 2.9 10*3/uL (ref 0.7–4.0)
Lymphocytes Relative: 33 % (ref 12–46)
MCH: 32.8 pg (ref 26.0–34.0)
MCHC: 33.8 g/dL (ref 30.0–36.0)
MCV: 97 fL (ref 78.0–100.0)
Monocytes Absolute: 0.6 10*3/uL (ref 0.1–1.0)
Monocytes Relative: 7 % (ref 3–12)
Neutro Abs: 5.2 10*3/uL (ref 1.7–7.7)
Neutrophils Relative %: 59 % (ref 43–77)
Platelets: 254 10*3/uL (ref 150–400)
RBC: 4.03 MIL/uL (ref 3.87–5.11)
RDW: 13 % (ref 11.5–15.5)
WBC: 9 10*3/uL (ref 4.0–10.5)

## 2013-12-15 LAB — WET PREP, GENITAL
Clue Cells Wet Prep HPF POC: NONE SEEN
TRICH WET PREP: NONE SEEN
WBC, Wet Prep HPF POC: NONE SEEN
Yeast Wet Prep HPF POC: NONE SEEN

## 2013-12-15 LAB — COMPREHENSIVE METABOLIC PANEL
ALBUMIN: 4.1 g/dL (ref 3.5–5.2)
ALT: 16 U/L (ref 0–35)
ANION GAP: 14 (ref 5–15)
AST: 18 U/L (ref 0–37)
Alkaline Phosphatase: 60 U/L (ref 39–117)
BUN: 12 mg/dL (ref 6–23)
CALCIUM: 9.7 mg/dL (ref 8.4–10.5)
CO2: 24 mEq/L (ref 19–32)
Chloride: 103 mEq/L (ref 96–112)
Creatinine, Ser: 0.6 mg/dL (ref 0.50–1.10)
GFR calc Af Amer: >90 mL/min (ref >90)
GFR calc non Af Amer: >90 mL/min (ref >90)
GLUCOSE: 99 mg/dL (ref 70–99)
Potassium: 4.1 mEq/L (ref 3.7–5.3)
SODIUM: 141 meq/L (ref 137–147)
TOTAL PROTEIN: 7 g/dL (ref 6.0–8.3)
Total Bilirubin: 0.3 mg/dL (ref 0.3–1.2)

## 2013-12-15 LAB — URINALYSIS, ROUTINE W REFLEX MICROSCOPIC
BILIRUBIN URINE: NEGATIVE
Glucose, UA: NEGATIVE mg/dL
Hgb urine dipstick: NEGATIVE
Ketones, ur: NEGATIVE mg/dL
Leukocytes, UA: NEGATIVE
Nitrite: NEGATIVE
PH: 5 (ref 5.0–8.0)
Protein, ur: NEGATIVE mg/dL
Specific Gravity, Urine: 1.025 (ref 1.005–1.030)
Urobilinogen, UA: 0.2 mg/dL (ref 0.0–1.0)

## 2013-12-15 LAB — POC URINE PREG, ED: Preg Test, Ur: NEGATIVE

## 2013-12-15 MED ORDER — CIPROFLOXACIN HCL 500 MG PO TABS: 500.0000 mg | ORAL_TABLET | Freq: Two times a day (BID) | ORAL | Status: DC

## 2013-12-15 MED ORDER — SODIUM CHLORIDE 0.9 % IV BOLUS (SEPSIS)
1000.0000 mL | Freq: Once | INTRAVENOUS | Status: AC
  Administered 2013-12-15: 1000 mL via INTRAVENOUS

## 2013-12-15 NOTE — ED Notes (Signed)
C/o abd pain, denies other sx. "does not want anything for pain at this time", "does not want narcotics".

## 2013-12-15 NOTE — ED Provider Notes (Signed)
[name]: [id number]     Arrival date & time 12/15/13  0046 History   First MD Initiated Contact with Patient 12/15/13 0102     This chart was scribed for Erica Canalavid H Yao, MD by Arlan OrganAshley Leger, ED Scribe. This patient was seen in room D36C/D36C and the patient's care was started 1:03 AM.   Chief Complaint  Patient presents with  . Abdominal Pain   HPI  HPI Comments: Erica [name] is a 33 y.o. female with a PMHx of GERD who presents to the Emergency Department complaining of constant, moderate lower abdominal pain x 2 hours that has progressively worsened. Pt also reports lower back pain, urinary frequency, HA, mild nausea, sore throat, and dysuria. She reports intermittent spasms to the muscles in her lower extremities that has been ongoing for some time now. Pt also mentions clear/light brown vaginal discharge. She denies a history of STI's. Pt has tried 2 doses of prescribed Amoxicillin from a previous illness last night and today. Erica [name] denies any fever at this time. No known allergies to medications.   Past Medical History  Diagnosis Date  . No pertinent past medical history   . Anxiety     as teen  . Abnormal Pap smear   . Depression     as teen  . VBAC, delivered, current hospitalization 05/19/2011  . Headache(784.0)   . GERD (gastroesophageal reflux disease)    Past Surgical History  Procedure Laterality Date  . Cesarean section    . Colposcopy    . Wisdom tooth extraction    . Dilation and curettage of uterus  2010   Family History  Problem Relation Age of Onset  . Hypertension Mother   . Seizures Mother   . Hypertension Maternal Grandmother   . Arthritis Maternal Grandmother   . Cancer Maternal Grandmother     breast  . Stroke Maternal Grandfather   . Seizures Brother   . Mental illness Brother     paranoid schizophrenia  . Birth defects Daughter     hole in heart  . Seizures Maternal Aunt   . Seizures Brother   . Seizures Cousin   . Seizures Cousin   . Mental  retardation Cousin    History  Substance Use Topics  . Smoking status: Current Every Day Smoker -- 1.00 packs/day for 13 years    Types: Cigarettes  . Smokeless tobacco: Never Used  . Alcohol Use: No     Comment: rare   OB History    Gravida Para Term Preterm AB TAB SAB Ectopic Multiple Living   7 6 4 2 1  0 1 0 0 6     Review of Systems  Constitutional: Negative for fever and chills.  HENT: Positive for sore throat.   Gastrointestinal: Positive for nausea and abdominal pain.  Genitourinary: Positive for dysuria, frequency and vaginal discharge.  Musculoskeletal: Positive for back pain.  Neurological: Positive for headaches.  All other systems reviewed and are negative.     Allergies  Review of patient's allergies indicates no known allergies.  Home Medications   Prior to Admission medications   Medication Sig Start Date End Date Taking? Authorizing Provider  amoxicillin (AMOXIL) 875 MG tablet Take 1 tablet (875 mg total) by mouth 2 (two) times daily. 09/20/13  Yes Graylon GoodZachary H Baker, PA-C  amoxicillin (AMOXIL) 500 MG capsule Take 1 capsule (500 mg total) by mouth 2 (two) times daily. 08/20/13   Charm RingsErin J Honig, MD  ibuprofen (ADVIL,MOTRIN) 600 MG  tablet Take 1 tablet (600 mg total) by mouth every 6 (six) hours as needed. 08/20/13   Antony MaduraKelly Humes, PA-C  predniSONE (DELTASONE) 20 MG tablet Take 2 tablets (40 mg total) by mouth daily. 08/20/13   Antony MaduraKelly Humes, PA-C   Triage Vitals: BP 131/67 mmHg  Pulse 75  Temp(Src) 98.2 F (36.8 C) (Oral)  Resp 18  SpO2 98%  LMP 11/26/2013   Physical Exam  Constitutional: She is oriented to person, place, and time. She appears well-developed and well-nourished.  HENT:  Head: Normocephalic.  Eyes: EOM are normal.  Neck: Normal range of motion.  Cardiovascular: Normal rate, regular rhythm and normal heart sounds.   Pulmonary/Chest: Effort normal and breath sounds normal. She has no wheezes.  Abdominal: She exhibits no distension. There is no  rebound.  Suprapubic tenderness noted  Genitourinary:  No CMT No adnexal tenderness Whitish discharge noted  Musculoskeletal: Normal range of motion.  Neurological: She is alert and oriented to person, place, and time.  Psychiatric: She has a normal mood and affect.  Nursing note and vitals reviewed.   ED Course  Procedures (including critical care time)  DIAGNOSTIC STUDIES: Oxygen Saturation is 98% on RA, Normal by my interpretation.    COORDINATION OF CARE: 1:10 AM- Will give fluids. Will order CBC, CMP, POC urine pregnancy, and urinalysis. Discussed treatment plan with pt at bedside and pt agreed to plan.     Labs Review Labs Reviewed  URINALYSIS, ROUTINE W REFLEX MICROSCOPIC - Abnormal; Notable for the following:    APPearance HAZY (*)    All other components within normal limits  WET PREP, GENITAL  GC/CHLAMYDIA PROBE AMP  URINE CULTURE  CBC WITH DIFFERENTIAL  COMPREHENSIVE METABOLIC PANEL  POC URINE PREG, ED    Imaging Review No results found.   EKG Interpretation None      MDM   Final diagnoses:  None    Erica [name] is a 33 y.o. female here with dysuria. Labs unremarkable. UA clear. Wet prep neg. Given patient symptomatic, given cipro empirically. Urine culture sent. But I think may have interstitial cystitis. Will d/c home.   I personally performed the services described in this documentation, which was scribed in my presence. The recorded information has been reviewed and is accurate.    Erica Canalavid H Yao, MD 12/15/13 [phone]

## 2013-12-15 NOTE — ED Notes (Signed)
Alert, NAD, calm, sitting with husband, watching TV, no changes, pending re-eval & disposition.

## 2013-12-15 NOTE — ED Notes (Signed)
Pt reports lower abdominal pain and R lower back pain x 2 hours. Pt has been having urinary frequency and burning with urination. Pt also c/o headache.

## 2013-12-15 NOTE — Discharge Instructions (Signed)
Take cipro twice a day for 5 days.   Follow up with your doctor.   Return to ER if you have worse pain, vomiting, fevers.

## 2013-12-15 NOTE — ED Notes (Signed)
Dr. Yao at BS.  

## 2013-12-15 NOTE — ED Notes (Signed)
No changes, resting, NAD, calm.

## 2013-12-16 LAB — URINE CULTURE: Colony Count: 1000

## 2013-12-18 ENCOUNTER — Emergency Department (HOSPITAL_COMMUNITY)
Admission: EM | Admit: 2013-12-18 | Discharge: 2013-12-18 | Disposition: A | Discharge status: Home / Self Care | Payer: BC Managed Care – PPO | Attending: Emergency Medicine | Admitting: Emergency Medicine

## 2013-12-18 ENCOUNTER — Encounter (HOSPITAL_COMMUNITY): Payer: Self-pay | Admitting: *Deleted

## 2013-12-18 DIAGNOSIS — M62838 Other muscle spasm: Secondary | ICD-10-CM | POA: Diagnosis not present

## 2013-12-18 DIAGNOSIS — Z8719 Personal history of other diseases of the digestive system: Secondary | ICD-10-CM | POA: Insufficient documentation

## 2013-12-18 DIAGNOSIS — Z72820 Sleep deprivation: Secondary | ICD-10-CM

## 2013-12-18 DIAGNOSIS — Z720 Tobacco use: Secondary | ICD-10-CM | POA: Insufficient documentation

## 2013-12-18 DIAGNOSIS — Z7952 Long term (current) use of systemic steroids: Secondary | ICD-10-CM | POA: Diagnosis not present

## 2013-12-18 DIAGNOSIS — H6122 Impacted cerumen, left ear: Secondary | ICD-10-CM | POA: Diagnosis not present

## 2013-12-18 DIAGNOSIS — R253 Fasciculation: Secondary | ICD-10-CM

## 2013-12-18 DIAGNOSIS — Z792 Long term (current) use of antibiotics: Secondary | ICD-10-CM | POA: Insufficient documentation

## 2013-12-18 DIAGNOSIS — F419 Anxiety disorder, unspecified: Secondary | ICD-10-CM

## 2013-12-18 DIAGNOSIS — R252 Cramp and spasm: Secondary | ICD-10-CM

## 2013-12-18 LAB — GC/CHLAMYDIA PROBE AMP
CT PROBE, AMP APTIMA: NEGATIVE
GC PROBE AMP APTIMA: NEGATIVE

## 2013-12-18 LAB — MAGNESIUM: MAGNESIUM: 2.2 mg/dL (ref 1.5–2.5)

## 2013-12-18 LAB — I-STAT CHEM 8, ED
BUN: 9 mg/dL (ref 6–23)
CHLORIDE: 104 meq/L (ref 96–112)
Calcium, Ion: 1.16 mmol/L (ref 1.12–1.23)
Creatinine, Ser: 0.6 mg/dL (ref 0.50–1.10)
Glucose, Bld: 103 mg/dL — ABNORMAL HIGH (ref 70–99)
HEMATOCRIT: 44 % (ref 36.0–46.0)
Hemoglobin: 15 g/dL (ref 12.0–15.0)
Potassium: 4 mEq/L (ref 3.7–5.3)
Sodium: 139 mEq/L (ref 137–147)
TCO2: 22 mmol/L (ref 0–100)

## 2013-12-18 NOTE — ED Notes (Signed)
Awake. Verbally responsive. Resp even and unlabored. ABC's intact. NAD noted. Family at bedside.

## 2013-12-18 NOTE — ED Notes (Signed)
Awake. Verbally responsive. Resp even and unlabored. ABC's intact. NAD noted. Pt reported having headaches, generalized muscle twitching, stress and lack of sleep.

## 2013-12-18 NOTE — Discharge Instructions (Signed)
Your labs were all normal. Try improving your quality and quantity of sleep using the sleep hygiene tips discussed with you today. Try to decrease the stress and anxiety in your life, which may help your symptoms resolve. Use the list below to find a regular doctor. Return to the ER for any changes or worsening in symptoms.   Blepharospasm Blepharospasm is an abnormal, involuntary blinking, movement, or spasm of the eyelids. CAUSES  Anyone can have an eye twitch from time to time. In most cases, there is no clear cause. Eyelid spasms may be associated with or prolonged by:   Alcohol.  Caffeine.  Fatigue.  Irritation of the eye surface or inner eyelids.  Lack of sleep.  Physical exertion.  Smoking.  Stress. Chronic, uncontrollable eyelid movement affecting both eyes is known as benign essential blepharospasm. This is not a common problem. Although its exact cause is unknown, the following conditions sometimes come before or during a problem of benign essential blepharospasm:   Eyelid infection (blepharitis).  Dry eyes.  Light sensitivity.  Pink eye (conjunctivitis). Rarely, eye twitch may be a sign of certain brain and nerve disorders. When it is, there are almost always other signs and symptoms. Brain and nerve disorders that can cause eye twitch include:   Bell's palsy.  Benign essential blepharospasm.  Dystonia.  Parkinson's Disease.  Side effects of drugs, particularly medications used to treat epilepsy and psychosis.  Spasmodic torticollis (a different type of muscle spasm sometimes accompanied by blepharospasm).  Tourette syndrome. SYMPTOMS  Random eye twitching can come and go for a few days, weeks or months. The spasms do not hurt, but they can be annoying. In its most common, harmless form, eye twitching stops on its own. It may recur off and on for no reason. The onset of blepharospasm may include:   The development of blepharospasm without any warning  signs.  A gradual increase in blinking or eye irritation.  Other symptoms including:  Fatigue.  Emotional tension.  Sensitivity to bright light. Symptoms may lessen or stop while:  A person is sleeping.  Concentrating on a specific task. If the condition progresses, the symptoms become more frequent. Facial spasms may develop. This is rare and may be the earliest sign of a more chronic movement disorder. TREATMENT  To date, there is no successful cure. Several treatment options can reduce its severity.  In some places, Botox shots into the muscles of the eyelids is an approved treatment. Botulinum toxin temporarily paralyzes the muscles of the eyelids.  Other medications can have unpredictable results. Any symptom relief is usually short term. They tend to be helpful in only a small percentage of cases.  A surgical procedure to remove some of the muscles and nerves of the eyelids (myectomy) is also an option. This surgery has improved symptoms in 75 to 85 percent of people.  Alternative treatments (the benefits of these alternative therapies have not been proven) may include:  Biofeedback.  Acupuncture.  Hypnosis.  Chiropractic treatment.  Nutritional therapy. FOR MORE INFORMATION National Eye Institute: EcoRefrigerator.com.auhttp://www.nei.nih.gov/health/blepha/ Document Released: 01/14/2003 Document Revised: 04/05/2011 Document Reviewed: 11/29/2006 [facility] Patient Information 2015 [place], [place]. This information is not intended to replace advice given to you by your health care provider. Make sure you discuss any questions you have with your health care provider.  Muscle Cramps and Spasms Muscle cramps and spasms occur when a muscle or muscles tighten and you have no control over this tightening (involuntary muscle contraction). They are a common problem  and can develop in any muscle. The most common place is in the calf muscles of the leg. Both muscle cramps and muscle spasms are  involuntary muscle contractions, but they also have differences:   Muscle cramps are sporadic and painful. They may last a few seconds to a quarter of an hour. Muscle cramps are often more forceful and last longer than muscle spasms.  Muscle spasms may or may not be painful. They may also last just a few seconds or much longer. CAUSES  It is uncommon for cramps or spasms to be due to a serious underlying problem. In many cases, the cause of cramps or spasms is unknown. Some common causes are:   Overexertion.   Overuse from repetitive motions (doing the same thing over and over).   Remaining in a certain position for a long period of time.   Improper preparation, form, or technique while performing a sport or activity.   Dehydration.   Injury.   Side effects of some medicines.   Abnormally low levels of the salts and ions in your blood (electrolytes), especially potassium and calcium. This could happen if you are taking water pills (diuretics) or you are pregnant.  Some underlying medical problems can make it more likely to develop cramps or spasms. These include, but are not limited to:   Diabetes.   Parkinson disease.   Hormone disorders, such as thyroid problems.   Alcohol abuse.   Diseases specific to muscles, joints, and bones.   Blood vessel disease where not enough blood is getting to the muscles.  HOME CARE INSTRUCTIONS   Stay well hydrated. Drink enough water and fluids to keep your urine clear or pale yellow.  It may be helpful to massage, stretch, and relax the affected muscle.  For tight or tense muscles, use a warm towel, heating pad, or hot shower water directed to the affected area.  If you are sore or have pain after a cramp or spasm, applying ice to the affected area may relieve discomfort.  Put ice in a plastic bag.  Place a towel between your skin and the bag.  Leave the ice on for 15-20 minutes, 03-04 times a day.  Medicines used to  treat a known cause of cramps or spasms may help reduce their frequency or severity. Only take over-the-counter or prescription medicines as directed by your caregiver. SEEK MEDICAL CARE IF:  Your cramps or spasms get more severe, more frequent, or do not improve over time.  MAKE SURE YOU:   Understand these instructions.  Will watch your condition.  Will get help right away if you are not doing well or get worse. Document Released: 07/03/2001 Document Revised: 05/08/2012 Document Reviewed: 12/29/2011 [facility] Patient Information 2015 [place], [place]. This information is not intended to replace advice given to you by your health care provider. Make sure you discuss any questions you have with your health care provider.  Panic Attacks Panic attacks are sudden, short feelings of great fear or discomfort. You may have them for no reason when you are relaxed, when you are uneasy (anxious), or when you are sleeping.  HOME CARE  Take all your medicines as told.  Check with your doctor before starting new medicines.  Keep all doctor visits. GET HELP IF:  You are not able to take your medicines as told.  Your symptoms do not get better.  Your symptoms get worse. GET HELP RIGHT AWAY IF:  Your attacks seem different than your normal attacks.  You have thoughts about hurting yourself or others.  You take panic attack medicine and you have a side effect. MAKE SURE YOU:  Understand these instructions.  Will watch your condition.  Will get help right away if you are not doing well or get worse. Document Released: 02/13/2010 Document Revised: 11/01/2012 Document Reviewed: 08/25/2012 [facility] Patient Information 2015 [place], [place]. This information is not intended to replace advice given to you by your health care provider. Make sure you discuss any questions you have with your health care provider. Emergency Department Resource Guide 1) Find a Doctor and Pay Out of Pocket Although  you won't have to find out who is covered by your insurance plan, it is a good idea to ask around and get recommendations. You will then need to call the office and see if the doctor you have chosen will accept you as a new patient and what types of options they offer for patients who are self-pay. Some doctors offer discounts or will set up payment plans for their patients who do not have insurance, but you will need to ask so you aren't surprised when you get to your appointment.  2) Contact Your Local Health Department Not all health departments have doctors that can see patients for sick visits, but many do, so it is worth a call to see if yours does. If you don't know where your local health department is, you can check in your phone book. The CDC also has a tool to help you locate your state's health department, and many state websites also have listings of all of their local health departments.  3) Find a Walk-in Clinic If your illness is not likely to be very severe or complicated, you may want to try a walk in clinic. These are popping up all over the country in pharmacies, drugstores, and shopping centers. They're usually staffed by nurse practitioners or physician assistants that have been trained to treat common illnesses and complaints. They're usually fairly quick and inexpensive. However, if you have serious medical issues or chronic medical problems, these are probably not your best option.  No Primary Care Doctor: - Call Health Connect at  [phone] - they can help you locate a primary care doctor that  accepts your insurance, provides certain services, etc. - Physician Referral Service- [phone]  Chronic Pain Problems: Organization         Address  Phone   Notes  [facility]  [phone] Patients need to be referred by their primary care doctor.   Medication Assistance: Organization         Address  Phone   Notes  [facility]  [facility] [address] [place], [place] [address] [phone] --Must be a resident of [place] [facility] [place] -- Must have NO insurance coverage whatsoever (no Medicaid/ Medicare, etc.) -- The pt. MUST have a primary care doctor that directs their care regularly and follows them in the community   [facility]  [phone]   [employer]  [phone]    Agencies that provide inexpensive medical care: Organization         Address  Phone   Notes  [facility]  [phone]   [facility]    [phone]   [facility] [address] [place], [place] [address] [phone]   [facility] [address], [place] [phone]   [facility]    (  [phone]   [facility]    [phone]   [facility]  [address], [place] Phone:  [phone], Fax:  [phone] Hours of Operation:  9 am - 6 pm, M-F.  Also accepts Medicaid/Medicare and self-pay.  [facility]  [address], [place] Phone: [phone], Fax: [phone]. Hours of Operation:  8:30 am - 5:30 pm, M-F.  Also accepts Medicaid and self-pay.  [facility] [place] [address] [place] Phone: [phone]   [facility] [address] Natasha Bence [place], [place] [phone], Ext. [phone] Mondays & Thursdays: 7-9 AM.  First 15 patients are seen on a first come, first serve basis.    Medicaid-accepting [facility] Providers:  Organization         Address  Phone   Notes  [facility] [address], [place] [phone] Also accepts self-pay patients.  [facility] [address] Laurell Josephs [place], [place]  [phone]   [facility] [address], [place] [phone]   [facility] [address] [place] [phone]   Renaye Rakers [address], [place]   [phone] Only accepts [place] patients after they have their name applied to their card.   Self-Pay (no insurance) in [facility]:  Organization         Address  Phone   Notes  Sickle Cell Patients, [facility] [address] [place], [place] [phone]   [facility] [address] [place], [place] [phone]   [facility]  [address], [place] [phone]   [facility]/Dr. Osei-Bonsu  [address] [place] or [address], [place] [phone] Phone number for both [place] and [place] locations is the same.  [facility] [address] , [place]  [phone]   [facility] [address] [place] or [address] [phone]   [facility] [address] [place] [phone], phone; [phone], fax Sees patients 1st and 3rd Saturday of every month.  Must not qualify for public or private insurance (i.e. Medicaid, Medicare, [place] Health Choice, Veterans' Benefits)  Household income should be no more than 200% of the poverty level The clinic cannot treat you if you are pregnant or think you are pregnant  Sexually transmitted diseases are not treated at the clinic.

## 2013-12-18 NOTE — ED Notes (Signed)
PT states that she has muscle spasms and jerking to her legs; pt c/o eye twitching; c/o pressure to head and ears; pt c/o restlessness and feeling moody; pt states [facility] and they advised that she needed to follow up with Neuro and be evaluated for MS: pt states that she does not believe that she has MS; pt states that she was taking Magnesium pills from a co-worker and thinks this may be related; pt states that she has researched hypo and hyper magnesium on the intranet and thinks that is what her symptoms are related to.

## 2013-12-18 NOTE — ED Provider Notes (Signed)
[name]: [id number]     Arrival date & time 12/18/13  0532 History   First MD Initiated Contact with Patient 12/18/13 [phone]     Chief Complaint  Patient presents with  . eye twitching   . Spasms     (Consider location/radiation/quality/duration/timing/severity/associated sxs/prior Treatment) HPI Comments: Erica [name] is a 33 y.o. female with a PMHx of anxiety, depression, GERD, and chronic headaches, who presents to the ED with complaints of 1 month of intermittent muscle twitches and eye twitching. She was seen on 12/10/13 for the same complaint, and was told to follow-up with neurology, but has not yet seen them. She reports the twitches last just a few seconds, and come sporadically. She noticed that they seem to come in times of stress, anxiety, or sleep deprivation. She works as a Lawyer on second shift, and endorses that she has trouble sleeping at night and is only getting approximately 2-3 hours of sleep per night. She states that a nurse at work gave her multivitamins and magnesium oxide to try to help with these twitches, but she has not noticed any relief. She states she "google searched" her symptoms and is concerned that she may have imbalances in her vitamin or electrolyte levels. This has caused increased anxiety and concern, which seems to have increased the amount of twitching she noticed. She denies that the twitches are painful, or sustained longer than a few seconds. Denies eye pain, blurry vision, loss of vision, abnormal color vision, double vision, FB sensation or eye itching, photophobia, scotomas, HA, tinnitus, dizziness, lightheadedness, vertigo, fever, chills, CP, SOB, abd pain, n/v/d/c, dysuria, hematuria, vaginal discharge/bleeding, tremors, weakness, numbness, paresthesias, or focal neuro deficits. Denies rashes. Of note, she's currently on cipro for a possible UTI, was seen on 12/15/13 for this, and her symptoms of dysuria have resolved in addition to her vaginal discharge. Chart  review notable for CMP that was WNL at that time.   Patient is a 33 y.o. female presenting with eye problem. The history is provided by the patient. No language interpreter was used.  Eye Problem Location:  Both Quality: twitching. Severity:  Mild Onset quality:  Gradual Duration:  1 month Timing:  Sporadic Progression:  Waxing and waning Chronicity:  Recurrent Context comment:  Stress, anxiety, and sleep deprivation Relieved by:  None tried Exacerbated by: stress, sleep deprivation. Ineffective treatments: magnesium oxide tablets and multivitamins. Associated symptoms: no blurred vision, no decreased vision, no discharge, no double vision, no foreign body sensation, no headaches, no itching, no nausea, no numbness, no photophobia, no redness, no scotomas, no tingling, no vomiting and no weakness     Past Medical History  Diagnosis Date  . No pertinent past medical history   . Anxiety     as teen  . Abnormal Pap smear   . Depression     as teen  . VBAC, delivered, current hospitalization 05/19/2011  . Headache(784.0)   . GERD (gastroesophageal reflux disease)    Past Surgical History  Procedure Laterality Date  . Cesarean section    . Colposcopy    . Wisdom tooth extraction    . Dilation and curettage of uterus  2010   Family History  Problem Relation Age of Onset  . Hypertension Mother   . Seizures Mother   . Hypertension Maternal Grandmother   . Arthritis Maternal Grandmother   . Cancer Maternal Grandmother     breast  . Stroke Maternal Grandfather   . Seizures Brother   . Mental  illness Brother     paranoid schizophrenia  . Birth defects Daughter     hole in heart  . Seizures Maternal Aunt   . Seizures Brother   . Seizures Cousin   . Seizures Cousin   . Mental retardation Cousin    History  Substance Use Topics  . Smoking status: Current Every Day Smoker -- 1.00 packs/day for 13 years    Types: Cigarettes  . Smokeless tobacco: Never Used  . Alcohol  Use: No     Comment: rare   OB History    Gravida Para Term Preterm AB TAB SAB Ectopic Multiple Living   7 6 4 2 1  0 1 0 0 6     Review of Systems  Constitutional: Negative for fever and appetite change.  HENT: Negative for congestion, ear pain, facial swelling, rhinorrhea and sinus pressure.   Eyes: Negative for blurred vision, double vision, photophobia, pain, discharge, redness, itching and visual disturbance.  Respiratory: Negative for shortness of breath.   Cardiovascular: Negative for chest pain.  Gastrointestinal: Negative for nausea, vomiting, abdominal pain, diarrhea and constipation.  Genitourinary: Negative for dysuria, hematuria, vaginal bleeding and vaginal discharge.  Musculoskeletal: Negative for myalgias, back pain, arthralgias and neck pain.  Skin: Negative for rash.  Neurological: Negative for dizziness, tingling, tremors, seizures, syncope, facial asymmetry, weakness, light-headedness, numbness and headaches.  Psychiatric/Behavioral: Positive for sleep disturbance. Negative for confusion. The patient is nervous/anxious.    10 Systems reviewed and are negative for acute change except as noted in the HPI.    Allergies  Review of patient's allergies indicates no known allergies.  Home Medications   Prior to Admission medications   Medication Sig Start Date End Date Taking? Authorizing Provider  ciprofloxacin (CIPRO) 500 MG tablet Take 1 tablet (500 mg total) by mouth 2 (two) times daily. 12/15/13  Yes Richardean Canalavid H Yao, MD  ibuprofen (ADVIL,MOTRIN) 200 MG tablet Take 400 mg by mouth every 6 (six) hours as needed for moderate pain.   Yes Historical Provider, MD  amoxicillin (AMOXIL) 500 MG capsule Take 1 capsule (500 mg total) by mouth 2 (two) times daily. Patient not taking: Reported on 12/18/2013 08/20/13   Charm RingsErin J Honig, MD  amoxicillin (AMOXIL) 875 MG tablet Take 1 tablet (875 mg total) by mouth 2 (two) times daily. Patient not taking: Reported on 12/18/2013 09/20/13    Graylon GoodZachary H Baker, PA-C  ibuprofen (ADVIL,MOTRIN) 600 MG tablet Take 1 tablet (600 mg total) by mouth every 6 (six) hours as needed. Patient not taking: Reported on 12/18/2013 08/20/13   Antony MaduraKelly Humes, PA-C  predniSONE (DELTASONE) 20 MG tablet Take 2 tablets (40 mg total) by mouth daily. Patient not taking: Reported on 12/18/2013 08/20/13   Antony MaduraKelly Humes, PA-C   BP 132/80 mmHg  Pulse 82  Temp(Src) 97.7 F (36.5 C) (Oral)  Resp 18  Ht 5\' 3"  (1.6 m)  Wt 140 lb (63.504 kg)  BMI 24.81 kg/m2  SpO2 99%  LMP 11/26/2013 Physical Exam  Constitutional: She is oriented to person, place, and time. Vital signs are normal. She appears well-developed and well-nourished.  Non-toxic appearance. No distress.  Afebrile, nontoxic, NAD. VSS. Appears tired  HENT:  Head: Normocephalic and atraumatic.  Right Ear: Hearing normal. No swelling or tenderness. Tympanic membrane is not erythematous and not bulging. No middle ear effusion.  Left Ear: No drainage, swelling or tenderness. A foreign body (cerumen impaction) is present.  Mouth/Throat: Uvula is midline, oropharynx is clear and moist and mucous membranes are  normal.  L ear cerumen impaction. R ear clear without erythematous or bulging TM.  Eyes: Conjunctivae and EOM are normal. Pupils are equal, round, and reactive to light. Right eye exhibits no discharge. Left eye exhibits no discharge.  PERRL, EOMI  Neck: Normal range of motion. Neck supple.  FROM intact without spinous process or paraspinous muscle TTP, no bony stepoffs or deformities, no muscle spasms. No rigidity or meningeal signs. No bruising or swelling.   Cardiovascular: Normal rate, regular rhythm, normal heart sounds and intact distal pulses.  Exam reveals no gallop and no friction rub.   No murmur heard. Pulmonary/Chest: Effort normal and breath sounds normal. No respiratory distress. She has no decreased breath sounds. She has no wheezes. She has no rhonchi. She has no rales.  Abdominal: Soft.  Normal appearance and bowel sounds are normal. She exhibits no distension. There is no tenderness. There is no rigidity, no rebound, no guarding, no CVA tenderness, no tenderness at McBurney's point and negative Murphy's sign.  Musculoskeletal: Normal range of motion.  MAE x4 Strength 5/5 in all extremities Sensation grossly intact in all extremities Gait steady without assistance  Neurological: She is alert and oriented to person, place, and time. She has normal strength and normal reflexes. No cranial nerve deficit or sensory deficit. Coordination and gait normal.  A&O x4 CN 2-12 grossly intact Sensation and strength at baseline, no neuro deficits Coordination WNL, gait nonataxic Patellar DTRs 2+ bilaterally, no clonus  Skin: Skin is warm, dry and intact. No rash noted.  Psychiatric: Her mood appears anxious.  Appears tired and anxious  Nursing note and vitals reviewed.   ED Course  Procedures (including critical care time) Labs Review Labs Reviewed  I-STAT CHEM 8, ED - Abnormal; Notable for the following:    Glucose, Bld 103 (*)    All other components within normal limits  MAGNESIUM    Imaging Review No results found.   EKG Interpretation None      MDM   Final diagnoses:  Muscle twitching  Spasm  Anxious mood  Sleep deprivation    32y/o female with subjective intermittent muscle twitching. Seen last week for the same complaint, told to f/up with neuro but hasn't. Seen 3 days ago for another complaint and had labs done which were all WNL. She's concerned with possibility of hyper/hypoMg. Agreed to check labs to ease her concerns, but that this appeared to be more likely related to stress and anxiety, as well as a large component of sleep deprivation. Discussed at length lifestyle modifications she should try (sleep hygiene, decreased stress/anxiety, etc) to see if these alleviate the frequency of her symptoms. Discussed that neuro follow up would be reasonable if her  symptoms persisted after lifestyle changes. Discussed that she needs a PCP to help her with her chronic and ongoing medical concerns, and to help with sleep issues/anxiety. Discussed that medications exist to help with these, but shouldn't be used as a first line before lifestyle mods. She is agreeable to this plan. Will reassess after labs.  7:04 AM Mg 2.2. Chem 8 WNL. Will proceed with previously outlined plan. Will give resource guide for PCP f/up. I explained the diagnosis and have given explicit precautions to return to the ER including for any other new or worsening symptoms. The patient understands and accepts the medical plan as it's been dictated and I have answered their questions. Discharge instructions concerning home care and prescriptions have been given. The patient is STABLE and is discharged to  home in good condition.  BP 132/80 mmHg  Pulse 82  Temp(Src) 97.7 F (36.5 C) (Oral)  Resp 18  Ht 5\' 3"  (1.6 m)  Wt 140 lb (63.504 kg)  BMI 24.81 kg/m2  SpO2 96%  LMP 11/26/2013   Vicent Febles Strupp Camprubi-Soms, PA-C 12/18/13 [id number]  Tomasita CrumbleAdeleke Oni, MD 12/18/13 1205

## 2013-12-24 ENCOUNTER — Emergency Department (HOSPITAL_COMMUNITY): Payer: BC Managed Care – PPO

## 2013-12-24 ENCOUNTER — Encounter (HOSPITAL_COMMUNITY): Payer: Self-pay | Admitting: Emergency Medicine

## 2013-12-24 ENCOUNTER — Emergency Department (HOSPITAL_COMMUNITY)
Admission: EM | Admit: 2013-12-24 | Discharge: 2013-12-24 | Disposition: A | Discharge status: Home / Self Care | Payer: BC Managed Care – PPO | Attending: Emergency Medicine | Admitting: Emergency Medicine

## 2013-12-24 DIAGNOSIS — Z8659 Personal history of other mental and behavioral disorders: Secondary | ICD-10-CM | POA: Diagnosis not present

## 2013-12-24 DIAGNOSIS — Z7952 Long term (current) use of systemic steroids: Secondary | ICD-10-CM | POA: Diagnosis not present

## 2013-12-24 DIAGNOSIS — Z792 Long term (current) use of antibiotics: Secondary | ICD-10-CM | POA: Diagnosis not present

## 2013-12-24 DIAGNOSIS — Z720 Tobacco use: Secondary | ICD-10-CM | POA: Insufficient documentation

## 2013-12-24 DIAGNOSIS — R200 Anesthesia of skin: Secondary | ICD-10-CM | POA: Diagnosis not present

## 2013-12-24 DIAGNOSIS — H6122 Impacted cerumen, left ear: Secondary | ICD-10-CM | POA: Diagnosis not present

## 2013-12-24 DIAGNOSIS — R202 Paresthesia of skin: Secondary | ICD-10-CM | POA: Diagnosis not present

## 2013-12-24 DIAGNOSIS — Z8719 Personal history of other diseases of the digestive system: Secondary | ICD-10-CM | POA: Diagnosis not present

## 2013-12-24 NOTE — ED Notes (Signed)
C/o L arm burning that started 3 weeks ago.  States she intermittently has aching in L arm and L leg.  Also reports L eye and "different muscles in body" twitching.  States she has been seen in ED for same and told she may have MS.  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

## 2013-12-24 NOTE — ED Notes (Signed)
Dr. Glick at bedside.  

## 2013-12-24 NOTE — ED Provider Notes (Signed)
[name]: [id number]     Arrival date & time 12/24/13  0052 History  This chart was scribed for Dione Booze, MD by Bronson Curb, ED Scribe. This patient was seen in room B16C/B16C and the patient's care was started at 2:50 AM.     Chief Complaint  Patient presents with  . Tingling    The history is provided by the patient. No language interpreter was used.     HPI Comments: Erica [name] is a 33 y.o. female who presents to the Emergency Department complaining of sporadic left eye twitching and worsening, intermittent muscle spasms that has been ongoing for the past 3 weeks. Patient was seen 6 days ago (December 18, 2013) for the same. She suspects she is having stroke-like symptoms and reports associated left sided hemiparesis, left arm pain, nausea, HA (described as pressure), burning sensation in face, fatigue, difficulty swallowing, and tingling in lips. She also notes she is unbalanced and dropping items with her left hand. She reports that she has an appointment with neurology at [facility] in the next month, but is unable to wait due to worsening symptoms. Patient states she was informed her symptoms could possible related to MS, however, she does not believe this to be the cause. Patient believes her symptoms were related to stress and sleep deprivation but states she is getting more sleep and has reduced stress without significant changes in her symptoms. She also states it is difficult to fall asleep because she feels as if she is "jumping" whenever she closes her eyes. Patient denies fever, chills, or vision changes/disturbances. Patient denies any chances of pregnancy.  Patient has been seen in ED multiple times for the same complaint and states she has been unable to get answers as to what is causing her symptoms.   Past Medical History  Diagnosis Date  . No pertinent past medical history   . Anxiety     as teen  . Abnormal Pap smear   . Depression     as teen  . VBAC, delivered,  current hospitalization 05/19/2011  . Headache(784.0)   . GERD (gastroesophageal reflux disease)   . Anxiety    Past Surgical History  Procedure Laterality Date  . Cesarean section    . Colposcopy    . Wisdom tooth extraction    . Dilation and curettage of uterus  2010   Family History  Problem Relation Age of Onset  . Hypertension Mother   . Seizures Mother   . Hypertension Maternal Grandmother   . Arthritis Maternal Grandmother   . Cancer Maternal Grandmother     breast  . Stroke Maternal Grandfather   . Seizures Brother   . Mental illness Brother     paranoid schizophrenia  . Birth defects Daughter     hole in heart  . Seizures Maternal Aunt   . Seizures Brother   . Seizures Cousin   . Seizures Cousin   . Mental retardation Cousin    History  Substance Use Topics  . Smoking status: Current Every Day Smoker -- 1.00 packs/day for 13 years    Types: Cigarettes  . Smokeless tobacco: Never Used  . Alcohol Use: Yes     Comment: rare   OB History    Gravida Para Term Preterm AB TAB SAB Ectopic Multiple Living   7 6 4 2 1  0 1 0 0 6     Review of Systems  Constitutional: Positive for fatigue. Negative for fever and chills.  HENT:  Positive for trouble swallowing.   Eyes: Negative for visual disturbance.  Musculoskeletal: Positive for myalgias (left arm).  Neurological: Positive for weakness (left side) and headaches.  All other systems reviewed and are negative.     Allergies  Review of patient's allergies indicates no known allergies.  Home Medications   Prior to Admission medications   Medication Sig Start Date End Date Taking? Authorizing Provider  ibuprofen (ADVIL,MOTRIN) 200 MG tablet Take 400 mg by mouth every 6 (six) hours as needed for moderate pain.   Yes Historical Provider, MD  amoxicillin (AMOXIL) 500 MG capsule Take 1 capsule (500 mg total) by mouth 2 (two) times daily. Patient not taking: Reported on 12/18/2013 08/20/13   Charm RingsErin J Honig, MD   amoxicillin (AMOXIL) 875 MG tablet Take 1 tablet (875 mg total) by mouth 2 (two) times daily. Patient not taking: Reported on 12/18/2013 09/20/13   Graylon GoodZachary H Baker, PA-C  ciprofloxacin (CIPRO) 500 MG tablet Take 1 tablet (500 mg total) by mouth 2 (two) times daily. 12/15/13   Richardean Canalavid H Yao, MD  ibuprofen (ADVIL,MOTRIN) 600 MG tablet Take 1 tablet (600 mg total) by mouth every 6 (six) hours as needed. Patient not taking: Reported on 12/18/2013 08/20/13   Antony MaduraKelly Humes, PA-C  predniSONE (DELTASONE) 20 MG tablet Take 2 tablets (40 mg total) by mouth daily. Patient not taking: Reported on 12/18/2013 08/20/13   Antony MaduraKelly Humes, PA-C   Triage Vitals: BP 128/67 mmHg  Pulse 92  Temp(Src) 98.9 F (37.2 C) (Oral)  Resp 16  Ht 5\' 4"  (1.626 m)  Wt 140 lb (63.504 kg)  BMI 24.02 kg/m2  SpO2 99%  LMP 11/26/2013  Physical Exam  Constitutional: She is oriented to person, place, and time. She appears well-developed and well-nourished. No distress.  HENT:  Head: Normocephalic and atraumatic.  Right Ear: Tympanic membrane, external ear and ear canal normal.  Left TM is obscured by cerumen.  Eyes: Conjunctivae and EOM are normal. Pupils are equal, round, and reactive to light.  Fundi are normal  Neck: Normal range of motion. Neck supple. No JVD present. No tracheal deviation present.  Cardiovascular: Normal rate, regular rhythm and normal heart sounds.   No murmur heard. Pulmonary/Chest: Effort normal and breath sounds normal. No respiratory distress. She has no wheezes. She has no rales.  Abdominal: Soft. Bowel sounds are normal. She exhibits no distension and no mass. There is no guarding.  Musculoskeletal: Normal range of motion. She exhibits no edema.  Lymphadenopathy:    She has no cervical adenopathy.  Neurological: She is alert and oriented to person, place, and time. She has normal reflexes. No cranial nerve deficit. Coordination normal.  No pronator drift. Romberg is normal. Tandem gait is normal.   Skin: Skin is warm and dry. No rash noted.  Psychiatric: She has a normal mood and affect. Her behavior is normal. Thought content normal.  Nursing note and vitals reviewed.   ED Course  Procedures (including critical care time)  DIAGNOSTIC STUDIES: Oxygen Saturation is 99% on room air, normal by my interpretation.    COORDINATION OF CARE: At 0309 Discussed treatment plan with patient which includes CT scan of head and cerumen removal. Patient agrees.   Imaging Review Ct Head Wo Contrast  12/24/2013   CLINICAL DATA:  Acute onset of left-sided weakness, headache, lightheadedness, dizziness and nausea. Head pressure, with tingling and pain at the left arm and left leg for the past 2-3 weeks. Initial encounter.  EXAM: CT HEAD WITHOUT CONTRAST  TECHNIQUE: Contiguous axial images were obtained from the base of the skull through the vertex without intravenous contrast.  COMPARISON:  None.  FINDINGS: There is no evidence of acute infarction, mass lesion, or intra- or extra-axial hemorrhage on CT.  The posterior fossa, including the cerebellum, brainstem and fourth ventricle, is within normal limits. The third and lateral ventricles, and basal ganglia are unremarkable in appearance. The cerebral hemispheres are symmetric in appearance, with normal gray-white differentiation. No mass effect or midline shift is seen.  There is no evidence of fracture; visualized osseous structures are unremarkable in appearance. The orbits are within normal limits. The paranasal sinuses and mastoid air cells are well-aerated. No significant soft tissue abnormalities are seen.  IMPRESSION: Unremarkable noncontrast CT of the head.   Electronically Signed   By: Roanna RaiderJeffery  Chang M.D.   On: 12/24/2013 04:30   Procedure: Irrigation of left external auditory canal to remove cerumen impaction Risks and benefits of the procedure as well as alternative treatments were explained to the patient and her husband 2 identifiers were used  to confirm patient's identity: Verbally with patient and hospital supplied armband Prior to procedure, a timeout was performed Description: Left external auditory canal was irrigated with warm water and cerumen impaction was successfully removed. Patient tolerated procedure well.  MDM   Final diagnoses:  Numbness and tingling  Cerumen impaction, left    Multiple complaints which do appear to have a neurologic basis but are difficult to put together. Numbness, burning, difficulty with coordination, eye twitching. Old records confirm several ED visits with negative laboratory workups and referral given to neurology. I discussed with patient and her husband that I am unlikely to find a definite answer since the study that she needs is an MRI scan to exclude multiple sclerosis. Patient is concerned that she has had a stroke or is going to have a stroke. CT of the head is obtained to try to reassure the patient. This is come back negative. She also has a cerumen impaction in the left ear. This may be contributing to some of her perceived balance problems. Cerumen impaction is cleared and patient states that she feels a little better but has not noticed any change in her neurologic symptoms. Outpatient MRI scan is scheduled and she is to keep her appointment with the neurologist.  I personally performed the services described in this documentation, which was scribed in my presence. The recorded information has been reviewed and is accurate.     Dione Boozeavid Shakyla Nolley, MD 12/24/13 [phone]

## 2013-12-24 NOTE — Discharge Instructions (Signed)
Keep your appointment with the neurologist.   Cerumen Impaction A cerumen impaction is when the wax in your ear forms a plug. This plug usually causes reduced hearing. Sometimes it also causes an earache or dizziness. Removing a cerumen impaction can be difficult and painful. The wax sticks to the ear canal. The canal is sensitive and bleeds easily. If you try to remove a heavy wax buildup with a cotton tipped swab, you may push it in further. Irrigation with water, suction, and small ear curettes may be used to clear out the wax. If the impaction is fixed to the skin in the ear canal, ear drops may be needed for a few days to loosen the wax. People who build up a lot of wax frequently can use ear wax removal products available in your local drugstore. SEEK MEDICAL CARE IF:  You develop an earache, increased hearing loss, or marked dizziness. Document Released: 02/19/2004 Document Revised: 04/05/2011 Document Reviewed: 04/10/2009 [facility] Patient Information 2015 [employer] [place]. This information is not intended to replace advice given to you by your health care provider. Make sure you discuss any questions you have with your health care provider.

## 2014-01-09 ENCOUNTER — Ambulatory Visit (HOSPITAL_COMMUNITY): Admission: RE | Admit: 2014-01-09 | Payer: BC Managed Care – PPO | Source: Ambulatory Visit

## 2014-01-31 ENCOUNTER — Ambulatory Visit: Payer: BC Managed Care – PPO | Admitting: Neurology

## 2014-02-01 ENCOUNTER — Encounter: Payer: Self-pay | Admitting: *Deleted

## 2014-02-01 ENCOUNTER — Telehealth: Payer: Self-pay | Admitting: Neurology

## 2014-02-01 NOTE — Progress Notes (Signed)
No show letter sent for 01/31/2014 

## 2014-02-01 NOTE — Telephone Encounter (Signed)
Pt no showed 01/31/14 NP appt with Dr. Everlena CooperJaffe. Pt did verbally confirm appt during reminder calls. Referred to our office by the ER.    Alcario DroughtErica - please send no show letter + NS policy to patient / Sherri S.

## 2014-02-24 ENCOUNTER — Encounter (HOSPITAL_COMMUNITY): Payer: Self-pay

## 2014-02-24 ENCOUNTER — Emergency Department (HOSPITAL_COMMUNITY)
Admission: EM | Admit: 2014-02-24 | Discharge: 2014-02-24 | Disposition: A | Discharge status: Home / Self Care | Payer: BLUE CROSS/BLUE SHIELD | Source: Home / Self Care | Attending: Family Medicine | Admitting: Family Medicine

## 2014-02-24 DIAGNOSIS — R630 Anorexia: Secondary | ICD-10-CM

## 2014-02-24 DIAGNOSIS — T700XXA Otitic barotrauma, initial encounter: Secondary | ICD-10-CM

## 2014-02-24 DIAGNOSIS — T148XXA Other injury of unspecified body region, initial encounter: Secondary | ICD-10-CM

## 2014-02-24 DIAGNOSIS — J069 Acute upper respiratory infection, unspecified: Secondary | ICD-10-CM

## 2014-02-24 DIAGNOSIS — T148 Other injury of unspecified body region: Secondary | ICD-10-CM

## 2014-02-24 DIAGNOSIS — R21 Rash and other nonspecific skin eruption: Secondary | ICD-10-CM

## 2014-02-24 LAB — POCT I-STAT, CHEM 8
BUN: 13 mg/dL (ref 6–23)
CHLORIDE: 105 mmol/L (ref 96–112)
Calcium, Ion: 1.19 mmol/L (ref 1.12–1.23)
Creatinine, Ser: 0.7 mg/dL (ref 0.50–1.10)
Glucose, Bld: 92 mg/dL (ref 70–99)
HCT: 42 % (ref 36.0–46.0)
Hemoglobin: 14.3 g/dL (ref 12.0–15.0)
Potassium: 3.7 mmol/L (ref 3.5–5.1)
Sodium: 140 mmol/L (ref 135–145)
TCO2: 21 mmol/L (ref 0–100)

## 2014-02-24 LAB — T4, FREE: Free T4: 1 ng/dL (ref 0.80–1.80)

## 2014-02-24 LAB — TSH: TSH: 0.786 u[IU]/mL (ref 0.350–4.500)

## 2014-02-24 LAB — POCT PREGNANCY, URINE: Preg Test, Ur: NEGATIVE

## 2014-02-24 MED ORDER — PERMETHRIN 5 % EX CREA: TOPICAL_CREAM | CUTANEOUS | Status: DC

## 2014-02-24 MED ORDER — IPRATROPIUM BROMIDE 0.06 % NA SOLN: 2.0000 | Freq: Four times a day (QID) | NASAL | Status: DC

## 2014-02-24 MED ORDER — PERMETHRIN 0.25 % LIQD
Status: AC
  Filled 2014-02-24: qty 147.86

## 2014-02-24 MED ORDER — TRIAMCINOLONE ACETONIDE 0.5 % EX OINT: 1.0000 "application " | TOPICAL_OINTMENT | Freq: Two times a day (BID) | CUTANEOUS | Status: DC

## 2014-02-24 MED ORDER — MUPIROCIN 2 % EX OINT: 1.0000 "application " | TOPICAL_OINTMENT | Freq: Two times a day (BID) | CUTANEOUS | Status: DC

## 2014-02-24 NOTE — ED Provider Notes (Signed)
Erica CourserLisa M [name] is a 34 y.o. female who presents to Urgent Care today for multiple issues.. 1) abrasion to the left posterior heel present for 2 days. Becoming more tender. No discharge fevers nausea vomiting or diarrhea. 2) patient also notes bilateral ear pain and pressure present for several days. No change in hearing. No cough congestion runny nose. 3) patient also notes about a week of decreased appetite and loose stools. No abdominal pain or vomiting. No blood in the stool. 4) rash. Patient is a 3 month history of pruritic rash on bilateral upper extremities trunk. Multiple other family members with similar rash. No bedbugs visible in the household. No new soaps detergents or shampoos or medications.   Past Medical History  Diagnosis Date  . No pertinent past medical history   . Anxiety     as teen  . Abnormal Pap smear   . Depression     as teen  . VBAC, delivered, current hospitalization 05/19/2011  . Headache(784.0)   . GERD (gastroesophageal reflux disease)   . Anxiety    Past Surgical History  Procedure Laterality Date  . Cesarean section    . Colposcopy    . Wisdom tooth extraction    . Dilation and curettage of uterus  2010   History  Substance Use Topics  . Smoking status: Current Every Day Smoker -- 1.00 packs/day for 13 years    Types: Cigarettes  . Smokeless tobacco: Never Used  . Alcohol Use: Yes     Comment: rare   ROS as above Medications: No current facility-administered medications for this encounter.   Current Outpatient Prescriptions  Medication Sig Dispense Refill  . ipratropium (ATROVENT) 0.06 % nasal spray Place 2 sprays into both nostrils 4 (four) times daily. 15 mL 1  . mupirocin ointment (BACTROBAN) 2 % Apply 1 application topically 2 (two) times daily. 30 g 1  . permethrin (ELIMITE) 5 % cream Apply from the neck down at night and wash off in the morning once 180 g 1  . triamcinolone ointment (KENALOG) 0.5 % Apply 1 application topically 2 (two)  times daily. 30 g 0   No Known Allergies   Exam:  BP 112/73 mmHg  Pulse 76  Temp(Src) 98.1 F (36.7 C) (Oral)  Resp 12  SpO2 100% Gen: Well NAD nontoxic appearing HEENT: EOMI,  MMM tympanic membranes are retracted bilaterally without effusion or erythema. Nontender mastoids bilaterally. No significant cervical lymphadenopathy. Posterior pharynx is normal appearing. Slightly enlarged thyroid without tenderness or nodules palpable Lungs: Normal work of breathing. CTABL Heart: RRR no MRG Abd: NABS, Soft. Nondistended, Nontender no rebound or guarding Exts: Brisk capillary refill, warm and well perfused.  Skin: Multiple areas of excoriation of bilateral upper extremities. Some small erythematous papules present. No purpura  Results for orders placed or performed during the hospital encounter of 02/24/14 (from the past 24 hour(s))  I-STAT, chem 8     Status: None   Collection Time: 02/24/14  1:34 PM  Result Value Ref Range   Sodium 140 135 - 145 mmol/L   Potassium 3.7 3.5 - 5.1 mmol/L   Chloride 105 96 - 112 mmol/L   BUN 13 6 - 23 mg/dL   Creatinine, Ser 1.610.70 0.50 - 1.10 mg/dL   Glucose, Bld 92 70 - 99 mg/dL   Calcium, Ion 0.961.19 0.451.12 - 1.23 mmol/L   TCO2 21 0 - 100 mmol/L   Hemoglobin 14.3 12.0 - 15.0 g/dL   HCT 40.942.0 81.136.0 - 91.446.0 %  Pregnancy, urine POC     Status: None   Collection Time: 02/24/14  1:49 PM  Result Value Ref Range   Preg Test, Ur NEGATIVE NEGATIVE   No results found.  Assessment and Plan: 34 y.o. female with  1) ear pain is likely Barotitis Media due to viral illness. Watchful waiting. 2) decreased appetite and loose stools likely due to viral illness likely same illness causing ear pain. Watchful waiting follow-up as needed. 3) Heel abrasion. Not yet infected. Prescribed mupirocin antibiotic ointment. Watchful waiting. 4) rash. Likely contact or irritant dermatitis however multiple family members with similar rash. Scabies is a possibility but not the most  likely cause. Treat with permethrin cream and triamcinolone cream.   Discussed warning signs or symptoms. Please see discharge instructions. Patient expresses understanding.     Rodolph Bong, MD 02/24/14 [phone]

## 2014-02-24 NOTE — Discharge Instructions (Signed)
Thank you for coming in today. Apply the mupirocin ointment to the heel cut. Return if not better Apply the permethrin cream from the neck down at night and wash off in the morning. Use Benadryl at night as needed for itching. Use Gold Bond Itch as needed. Come back if not getting better or worsening.  Used triamcinolone ointment for itching as needed Use Atrovent nasal spray   Allergies Allergies may happen from anything your body is sensitive to. This may be food, medicines, pollens, chemicals, and nearly anything around you in everyday life that produces allergens. An allergen is anything that causes an allergy producing substance. Heredity is often a factor in causing these problems. This means you may have some of the same allergies as your parents. Food allergies happen in all age groups. Food allergies are some of the most severe and life threatening. Some common food allergies are cow's milk, seafood, eggs, nuts, wheat, and soybeans. SYMPTOMS   Swelling around the mouth.  An itchy red rash or hives.  Vomiting or diarrhea.  Difficulty breathing. SEVERE ALLERGIC REACTIONS ARE LIFE-THREATENING. This reaction is called anaphylaxis. It can cause the mouth and throat to swell and cause difficulty with breathing and swallowing. In severe reactions only a trace amount of food (for example, peanut oil in a salad) may cause death within seconds. Seasonal allergies occur in all age groups. These are seasonal because they usually occur during the same season every year. They may be a reaction to molds, grass pollens, or tree pollens. Other causes of problems are house dust mite allergens, pet dander, and mold spores. The symptoms often consist of nasal congestion, a runny itchy nose associated with sneezing, and tearing itchy eyes. There is often an associated itching of the mouth and ears. The problems happen when you come in contact with pollens and other allergens. Allergens are the particles  in the air that the body reacts to with an allergic reaction. This causes you to release allergic antibodies. Through a chain of events, these eventually cause you to release histamine into the blood stream. Although it is meant to be protective to the body, it is this release that causes your discomfort. This is why you were given anti-histamines to feel better. If you are unable to pinpoint the offending allergen, it may be determined by skin or blood testing. Allergies cannot be cured but can be controlled with medicine. Hay fever is a collection of all or some of the seasonal allergy problems. It may often be treated with simple over-the-counter medicine such as diphenhydramine. Take medicine as directed. Do not drink alcohol or drive while taking this medicine. Check with your caregiver or package insert for child dosages. If these medicines are not effective, there are many new medicines your caregiver can prescribe. Stronger medicine such as nasal spray, eye drops, and corticosteroids may be used if the first things you try do not work well. Other treatments such as immunotherapy or desensitizing injections can be used if all else fails. Follow up with your caregiver if problems continue. These seasonal allergies are usually not life threatening. They are generally more of a nuisance that can often be handled using medicine. HOME CARE INSTRUCTIONS   If unsure what causes a reaction, keep a diary of foods eaten and symptoms that follow. Avoid foods that cause reactions.  If hives or rash are present:  Take medicine as directed.  You may use an over-the-counter antihistamine (diphenhydramine) for hives and itching as needed.  Apply cold compresses (cloths) to the skin or take baths in cool water. Avoid hot baths or showers. Heat will make a rash and itching worse.  If you are severely allergic:  Following a treatment for a severe reaction, hospitalization is often required for closer  follow-up.  Wear a medic-alert bracelet or necklace stating the allergy.  You and your family must learn how to give adrenaline or use an anaphylaxis kit.  If you have had a severe reaction, always carry your anaphylaxis kit or EpiPen with you. Use this medicine as directed by your caregiver if a severe reaction is occurring. Failure to do so could have a fatal outcome. SEEK MEDICAL CARE IF:  You suspect a food allergy. Symptoms generally happen within 30 minutes of eating a food.  Your symptoms have not gone away within 2 days or are getting worse.  You develop new symptoms.  You want to retest yourself or your child with a food or drink you think causes an allergic reaction. Never do this if an anaphylactic reaction to that food or drink has happened before. Only do this under the care of a caregiver. SEEK IMMEDIATE MEDICAL CARE IF:   You have difficulty breathing, are wheezing, or have a tight feeling in your chest or throat.  You have a swollen mouth, or you have hives, swelling, or itching all over your body.  You have had a severe reaction that has responded to your anaphylaxis kit or an EpiPen. These reactions may return when the medicine has worn off. These reactions should be considered life threatening. MAKE SURE YOU:   Understand these instructions.  Will watch your condition.  Will get help right away if you are not doing well or get worse. Document Released: 04/06/2002 Document Revised: 05/08/2012 Document Reviewed: 09/11/2007 [facility] Patient Information 2015 [place], [place]. This information is not intended to replace advice given to you by your health care provider. Make sure you discuss any questions you have with your health care provider.  Scabies Scabies are small bugs (mites) that burrow under the skin and cause red bumps and severe itching. These bugs can only be seen with a microscope. Scabies are highly contagious. They can spread easily from person to  person by direct contact. They are also spread through sharing clothing or linens that have the scabies mites living in them. It is not unusual for an entire family to become infected through shared towels, clothing, or bedding.  HOME CARE INSTRUCTIONS   Your caregiver may prescribe a cream or lotion to kill the mites. If cream is prescribed, massage the cream into the entire body from the neck to the bottom of both feet. Also massage the cream into the scalp and face if your child is less than 3 year old. Avoid the eyes and mouth. Do not wash your hands after application.  Leave the cream on for 8 to 12 hours. Your child should bathe or shower after the 8 to 12 hour application period. Sometimes it is helpful to apply the cream to your child right before bedtime.  One treatment is usually effective and will eliminate approximately 95% of infestations. For severe cases, your caregiver may decide to repeat the treatment in 1 week. Everyone in your household should be treated with one application of the cream.  New rashes or burrows should not appear within 24 to 48 hours after successful treatment. However, the itching and rash may last for 2 to 4 weeks after successful treatment. Your caregiver  may prescribe a medicine to help with the itching or to help the rash go away more quickly.  Scabies can live on clothing or linens for up to 3 days. All of your child's recently used clothing, towels, stuffed toys, and bed linens should be washed in hot water and then dried in a dryer for at least 20 minutes on high heat. Items that cannot be washed should be enclosed in a plastic bag for at least 3 days.  To help relieve itching, bathe your child in a cool bath or apply cool washcloths to the affected areas.  Your child may return to school after treatment with the prescribed cream. SEEK MEDICAL CARE IF:   The itching persists longer than 4 weeks after treatment.  The rash spreads or becomes infected.  Signs of infection include red blisters or yellow-tan crust. Document Released: 01/11/2005 Document Revised: 04/05/2011 Document Reviewed: 05/22/2008 [facility] Patient Information 2015 [place], [place]. This information is not intended to replace advice given to you by your health care provider. Make sure you discuss any questions you have with your health care provider.

## 2014-02-24 NOTE — ED Notes (Signed)
Patient  Complains of cut to the back of her left foot That has been oozing blood the past couple of days Complains of loss of appetite the past week Patient also complains of the tips of her ears get real red

## 2014-02-28 ENCOUNTER — Telehealth (HOSPITAL_COMMUNITY): Payer: Self-pay | Admitting: *Deleted

## 2014-02-28 NOTE — ED Notes (Addendum)
TSH 0.786, Free T 4 1.0.  Message sent to Dr. Denyse Amassorey.  He wrote to notify pt. to f/u with PCP.  I called and left a message to call.  Call 1. Erica [name], Erica [name] M 02/28/2014 Pt. called back.  Pt. verified x 2 and given results.  Pt. given instructions noted previously. Pt. states she had a cut on her heel and Dr. Denyse Amassorey e-prescribed oint. that she could not afford to fill.  She said it is draining green stuff now and thinks she needs the pills now.  I asked how she would be able to afford the pills if she could not get the ointment. She said she gets paid tomorrow.  Discussed with Dr. Denyse Amassorey.  He said she needs to come back in to be rechecked.  I told pt. this and she sounded irritated. She said "he just saw it 2 days ago and now she has to pay for another visit?"  I told her I was just relaying the information from the doctor.  02/28/2014

## 2014-04-09 ENCOUNTER — Encounter (HOSPITAL_COMMUNITY): Payer: Self-pay | Admitting: Emergency Medicine

## 2014-04-09 ENCOUNTER — Emergency Department (INDEPENDENT_AMBULATORY_CARE_PROVIDER_SITE_OTHER)
Admission: EM | Admit: 2014-04-09 | Discharge: 2014-04-09 | Disposition: A | Discharge status: Home / Self Care | Payer: Self-pay | Source: Home / Self Care | Attending: Emergency Medicine | Admitting: Emergency Medicine

## 2014-04-09 DIAGNOSIS — J029 Acute pharyngitis, unspecified: Secondary | ICD-10-CM

## 2014-04-09 LAB — POCT RAPID STREP A: Streptococcus, Group A Screen (Direct): NEGATIVE

## 2014-04-09 NOTE — ED Notes (Signed)
C/o sore throat x 2 days.  Denies fever,n/v/d.

## 2014-04-09 NOTE — ED Provider Notes (Signed)
[name]: [id number]     Arrival date & time 04/09/14  1521 History   First MD Initiated Contact with Patient 04/09/14 1728     Chief Complaint  Patient presents with  . Sore Throat   (Consider location/radiation/quality/duration/timing/severity/associated sxs/prior Treatment) Patient is a 34 y.o. female presenting with pharyngitis. The history is provided by the patient.  Sore Throat This is a new problem. The current episode started 2 days ago. The problem occurs constantly. The problem has not changed since onset.   Past Medical History  Diagnosis Date  . No pertinent past medical history   . Anxiety     as teen  . Abnormal Pap smear   . Depression     as teen  . VBAC, delivered, current hospitalization 05/19/2011  . Headache(784.0)   . GERD (gastroesophageal reflux disease)   . Anxiety    Past Surgical History  Procedure Laterality Date  . Cesarean section    . Colposcopy    . Wisdom tooth extraction    . Dilation and curettage of uterus  2010   Family History  Problem Relation Age of Onset  . Hypertension Mother   . Seizures Mother   . Hypertension Maternal Grandmother   . Arthritis Maternal Grandmother   . Cancer Maternal Grandmother     breast  . Stroke Maternal Grandfather   . Seizures Brother   . Mental illness Brother     paranoid schizophrenia  . Birth defects Daughter     hole in heart  . Seizures Maternal Aunt   . Seizures Brother   . Seizures Cousin   . Seizures Cousin   . Mental retardation Cousin    History  Substance Use Topics  . Smoking status: Current Every Day Smoker -- 1.00 packs/day for 13 years    Types: Cigarettes  . Smokeless tobacco: Never Used  . Alcohol Use: Yes     Comment: rare   OB History    Gravida Para Term Preterm AB TAB SAB Ectopic Multiple Living   0 1 0 0 6     Review of Systems  All other systems reviewed and are negative.   Allergies  Review of patient's allergies indicates no known allergies.  Home  Medications   Prior to Admission medications   Medication Sig Start Date End Date Taking? Authorizing Provider  ipratropium (ATROVENT) 0.06 % nasal spray Place 2 sprays into both nostrils 4 (four) times daily. 02/24/14   Rodolph Bong, MD  mupirocin ointment (BACTROBAN) 2 % Apply 1 application topically 2 (two) times daily. 02/24/14   Rodolph Bong, MD  permethrin (ELIMITE) 5 % cream Apply from the neck down at night and wash off in the morning once 02/24/14   Rodolph Bong, MD  triamcinolone ointment (KENALOG) 0.5 % Apply 1 application topically 2 (two) times daily. 02/24/14   Rodolph Bong, MD   BP 138/82 mmHg  Pulse 78  Resp 16  SpO2 100%  LMP  Physical Exam  Constitutional: She is oriented to person, place, and time. She appears well-developed and well-nourished. No distress.  HENT:  Head: Normocephalic and atraumatic.  Right Ear: Hearing, tympanic membrane, external ear and ear canal normal. No tenderness. No mastoid tenderness. Tympanic membrane is not injected and not erythematous. No middle ear effusion.  Left Ear: Hearing, tympanic membrane, external ear and ear canal normal. No tenderness. No mastoid tenderness. Tympanic membrane is not injected and not erythematous.  No middle  ear effusion.  Nose: Nose normal.  Mouth/Throat: Uvula is midline and mucous membranes are normal. Oral lesions present. No trismus in the jaw. No uvula swelling.    Outlined area consists of patch of several small white shallow ulcers on erythematous base  Eyes: Conjunctivae are normal. No scleral icterus.  Neck: Normal range of motion. Neck supple. No thyromegaly present.  Cardiovascular: Normal rate, regular rhythm and normal heart sounds.   Pulmonary/Chest: Effort normal and breath sounds normal.  Musculoskeletal: Normal range of motion.  Lymphadenopathy:    She has no cervical adenopathy.  Neurological: She is alert and oriented to person, place, and time.  Skin: Skin is warm and dry. No rash noted. No  erythema.  Psychiatric: Her behavior is normal.  Nursing note and vitals reviewed.   ED Course  Procedures (including critical care time) Labs Review Labs Reviewed  POCT RAPID STREP A ([facility]ARE ONLY)    Imaging Review No results found.   MDM   1. Pharyngitis   rapid strep test was negative. specimen will be held for 3 day culture and if results indicate the need for additional treatment, patient will be notified by phone. Warm salt water gargles 3-4 times a day, ibuprofen or tylenol as directed on packaging for discomfort and if symptoms do not improve over the next 3-4 days,  re-evaluation advised.     Ria ClockJennifer Lee H Dominiq Fontaine, [place] 04/09/14 [phone]

## 2014-04-09 NOTE — Discharge Instructions (Signed)
Your rapid strep test was negative. Your specimen will be held for 3 day culture and if results indicate the need for additional treatment, you will be notified by phone. Warm salt water gargles 3-4 times a day, ibuprofen or tylenol as directed on packaging for discomfort and if symptoms do not improve over the next 3-4 days, please have yourself re-evaluated.  Pharyngitis Pharyngitis is redness, pain, and swelling (inflammation) of your pharynx.  CAUSES  Pharyngitis is usually caused by infection. Most of the time, these infections are from viruses (viral) and are part of a cold. However, sometimes pharyngitis is caused by bacteria (bacterial). Pharyngitis can also be caused by allergies. Viral pharyngitis may be spread from person to person by coughing, sneezing, and personal items or utensils (cups, forks, spoons, toothbrushes). Bacterial pharyngitis may be spread from person to person by more intimate contact, such as kissing.  SIGNS AND SYMPTOMS  Symptoms of pharyngitis include:   Sore throat.   Tiredness (fatigue).   Low-grade fever.   Headache.  Joint pain and muscle aches.  Skin rashes.  Swollen lymph nodes.  Plaque-like film on throat or tonsils (often seen with bacterial pharyngitis). DIAGNOSIS  Your health care provider will ask you questions about your illness and your symptoms. Your medical history, along with a physical exam, is often all that is needed to diagnose pharyngitis. Sometimes, a rapid strep test is done. Other lab tests may also be done, depending on the suspected cause.  TREATMENT  Viral pharyngitis will usually get better in 3-4 days without the use of medicine. Bacterial pharyngitis is treated with medicines that kill germs (antibiotics).  HOME CARE INSTRUCTIONS   Drink enough water and fluids to keep your urine clear or pale yellow.   Only take over-the-counter or prescription medicines as directed by your health care provider:   If you are  prescribed antibiotics, make sure you finish them even if you start to feel better.   Do not take aspirin.   Get lots of rest.   Gargle with 8 oz of salt water ( tsp of salt per 1 qt of water) as often as every 1-2 hours to soothe your throat.   Throat lozenges (if you are not at risk for choking) or sprays may be used to soothe your throat. SEEK MEDICAL CARE IF:   You have large, tender lumps in your neck.  You have a rash.  You cough up green, yellow-brown, or bloody spit. SEEK IMMEDIATE MEDICAL CARE IF:   Your neck becomes stiff.  You drool or are unable to swallow liquids.  You vomit or are unable to keep medicines or liquids down.  You have severe pain that does not go away with the use of recommended medicines.  You have trouble breathing (not caused by a stuffy nose). MAKE SURE YOU:   Understand these instructions.  Will watch your condition.  Will get help right away if you are not doing well or get worse. Document Released: 01/11/2005 Document Revised: 11/01/2012 Document Reviewed: 09/18/2012 [facility] Patient Information 2015 [place], [place]. This information is not intended to replace advice given to you by your health care provider. Make sure you discuss any questions you have with your health care provider.  Sore Throat A sore throat is pain, burning, irritation, or scratchiness of the throat. There is often pain or tenderness when swallowing or talking. A sore throat may be accompanied by other symptoms, such as coughing, sneezing, fever, and swollen neck glands. A sore throat is  often the first sign of another sickness, such as a cold, flu, strep throat, or mononucleosis (commonly known as mono). Most sore throats go away without medical treatment. CAUSES  The most common causes of a sore throat include:  A viral infection, such as a cold, flu, or mono.  A bacterial infection, such as strep throat, tonsillitis, or whooping cough.  Seasonal  allergies.  Dryness in the air.  Irritants, such as smoke or pollution.  Gastroesophageal reflux disease (GERD). HOME CARE INSTRUCTIONS   Only take over-the-counter medicines as directed by your caregiver.  Drink enough fluids to keep your urine clear or pale yellow.  Rest as needed.  Try using throat sprays, lozenges, or sucking on hard candy to ease any pain (if older than 4 years or as directed).  Sip warm liquids, such as broth, herbal tea, or warm water with honey to relieve pain temporarily. You may also eat or drink cold or frozen liquids such as frozen ice pops.  Gargle with salt water (mix 1 tsp salt with 8 oz of water).  Do not smoke and avoid secondhand smoke.  Put a cool-mist humidifier in your bedroom at night to moisten the air. You can also turn on a hot shower and sit in the bathroom with the door closed for 5-10 minutes. SEEK IMMEDIATE MEDICAL CARE IF:  You have difficulty breathing.  You are unable to swallow fluids, soft foods, or your saliva.  You have increased swelling in the throat.  Your sore throat does not get better in 7 days.  You have nausea and vomiting.  You have a fever or persistent symptoms for more than 2-3 days.  You have a fever and your symptoms suddenly get worse. MAKE SURE YOU:   Understand these instructions.  Will watch your condition.  Will get help right away if you are not doing well or get worse. Document Released: 02/19/2004 Document Revised: 12/29/2011 Document Reviewed: 09/19/2011 [facility] Patient Information 2015 [place], [place]. This information is not intended to replace advice given to you by your health care provider. Make sure you discuss any questions you have with your health care provider.  Strep Throat Tests While most sore throats are caused by viruses, at times they are caused by a bacteria called group A Streptococci (strep throat). It is important to determine the cause because the strep bacteria is  treated with antibiotic medication. There are 2 types of tests for strep throat: a rapid strep test and a throat culture. Both tests are done by wiping a swab over the back of the throat and then using chemicals to identify the type of bacteria present. The rapid strep test takes 10 to 20 minutes. If the rapid strep test is negative, a throat culture may be performed to confirm the results. With a throat culture, the swab is used to spread the bacteria on a gel plate and grow it in a lab, which may take 1 to 2 days. In some cases, the culture will detect strep bacteria not found with the rapid strep test. If the result of the rapid strep test is positive, no further testing is needed, and your caregiver will prescribe antibiotics. Not all test results are available during your visit. If your test results are not back during the visit, make an appointment with your caregiver to find out the results. Do not assume everything is normal if you have not heard from your caregiver or the medical facility. It is important for you to follow  up on all of your test results. SEEK MEDICAL CARE IF:   Your symptoms are not improving within 1 to 2 days, or you are getting worse.  You have any other questions or concerns. SEEK IMMEDIATE MEDICAL CARE IF:   You have increased difficulty with swallowing.  You develop trouble breathing.  You have a fever. Document Released: 02/19/2004 Document Revised: 04/05/2011 Document Reviewed: 04/18/2013 [facility] Patient Information 2015 [place], [place]. This information is not intended to replace advice given to you by your health care provider. Make sure you discuss any questions you have with your health care provider.  Salt Water Gargle This solution will help make your mouth and throat feel better. HOME CARE INSTRUCTIONS   Mix 1 teaspoon of salt in 8 ounces of warm water.  Gargle with this solution as much or often as you need or as directed. Swish and gargle gently if  you have any sores or wounds in your mouth.  Do not swallow this mixture. Document Released: 10/16/2003 Document Revised: 04/05/2011 Document Reviewed: 03/08/2008 [facility] Patient Information 2015 [place], [place]. This information is not intended to replace advice given to you by your health care provider. Make sure you discuss any questions you have with your health care provider.

## 2014-04-12 LAB — CULTURE, GROUP A STREP: STREP A CULTURE: NEGATIVE

## 2014-07-21 ENCOUNTER — Encounter (HOSPITAL_COMMUNITY): Payer: Self-pay | Admitting: Emergency Medicine

## 2014-07-21 ENCOUNTER — Emergency Department (INDEPENDENT_AMBULATORY_CARE_PROVIDER_SITE_OTHER)
Admission: EM | Admit: 2014-07-21 | Discharge: 2014-07-21 | Disposition: A | Discharge status: Home / Self Care | Payer: Self-pay | Source: Home / Self Care | Attending: Emergency Medicine | Admitting: Emergency Medicine

## 2014-07-21 DIAGNOSIS — L299 Pruritus, unspecified: Secondary | ICD-10-CM

## 2014-07-21 DIAGNOSIS — R600 Localized edema: Secondary | ICD-10-CM

## 2014-07-21 DIAGNOSIS — J40 Bronchitis, not specified as acute or chronic: Secondary | ICD-10-CM

## 2014-07-21 MED ORDER — AZITHROMYCIN 250 MG PO TABS: ORAL_TABLET | ORAL | Status: DC

## 2014-07-21 MED ORDER — ALBUTEROL SULFATE HFA 108 (90 BASE) MCG/ACT IN AERS: 2.0000 | INHALATION_SPRAY | RESPIRATORY_TRACT | Status: DC | PRN

## 2014-07-21 NOTE — ED Notes (Signed)
C/o cold sx onset 3 days Sx include prod cough, congestion, BA, sneezing, runny nose Denies fever, chills Alert, no signs of acute distress.

## 2014-07-21 NOTE — ED Provider Notes (Signed)
[name]: [id number]     Arrival date & time 07/21/14  1355 History   First MD Initiated Contact with Patient 07/21/14 1413     Chief Complaint  Patient presents with  . URI   (Consider location/radiation/quality/duration/timing/severity/associated sxs/prior Treatment) HPI  She is a 34 year old woman here with several concerns.  Her primary concern is her cough. She has had sneezing, cough, rhinorrhea, intermittent sore throat, intermittent ear pain for the last 4 days. She also reports some occasional wheezing and shortness of breath. No fevers or chills. No nausea or vomiting. 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  Second, she reports a long history of swelling of her lower legs and ankles. She states this started about 7 years ago after having preeclampsia with pregnancy. It is worse after she has been on her feet. She also reports some bulging veins at the end of the day. The swelling resolves with elevation of the legs.  Lastly, she reports intermittent intense itching between her toes and fingers. She has tried over-the-counter anti-fungal creams without improvement. No rash.  Past Medical History  Diagnosis Date  . No pertinent past medical history   . Anxiety     as teen  . Abnormal Pap smear   . Depression     as teen  . VBAC, delivered, current hospitalization 05/19/2011  . Headache(784.0)   . GERD (gastroesophageal reflux disease)   . Anxiety    Past Surgical History  Procedure Laterality Date  . Cesarean section    . Colposcopy    . Wisdom tooth extraction    . Dilation and curettage of uterus  2010   Family History  Problem Relation Age of Onset  . Hypertension Mother   . Seizures Mother   . Hypertension Maternal Grandmother   . Arthritis Maternal Grandmother   . Cancer Maternal Grandmother     breast  . Stroke Maternal Grandfather   . Seizures Brother   . Mental illness Brother     paranoid  schizophrenia  . Birth defects Daughter     hole in heart  . Seizures Maternal Aunt   . Seizures Brother   . Seizures Cousin   . Seizures Cousin   . Mental retardation Cousin    History  Substance Use Topics  . Smoking status: Current Every Day Smoker -- 1.00 packs/day for 13 years    Types: Cigarettes  . Smokeless tobacco: Never Used  . Alcohol Use: Yes     Comment: rare   OB History    Gravida Para Term Preterm AB TAB SAB Ectopic Multiple Living   7 6 4 2 1  0 1 0 0 6     Review of Systems As in history of present illness Allergies  Review of patient's allergies indicates no known allergies.  Home Medications   Prior to Admission medications   Medication Sig Start Date End Date Taking? Authorizing Provider  albuterol (PROVENTIL HFA;VENTOLIN HFA) 108 (90 BASE) MCG/ACT inhaler Inhale 2 puffs into the lungs every 4 (four) hours as needed for wheezing or shortness of breath. 07/21/14   Charm Rings, MD  azithromycin (ZITHROMAX Z-PAK) 250 MG tablet Take 2 pills today, then 1 pill daily until gone. 07/21/14   Charm Rings, MD  ipratropium (ATROVENT) 0.06 % nasal spray Place 2 sprays into both nostrils 4 (four) times daily. 02/24/14   Rodolph Bong, MD   BP 110/72 mmHg  Pulse 60  Temp(Src) 98.7 F (37.1 C) (Oral)  Resp 16  SpO2 99%  LMP 07/14/2014 Physical Exam  Constitutional: She is oriented to person, place, and time. She appears well-developed and well-nourished. No distress.  HENT:  Nose: Nose normal.  Mouth/Throat: No oropharyngeal exudate.  Mild pharyngeal erythema.  TMs normal bilaterally.  Neck: Neck supple.  Cardiovascular: Normal rate, regular rhythm and normal heart sounds.   No murmur heard. Pulmonary/Chest: Effort normal and breath sounds normal. No respiratory distress. She has no wheezes. She has no rales.  Musculoskeletal: She exhibits no edema or tenderness.  No lower extremity edema or tenderness. She does have a few varicose veins.  Neurological: She is  alert and oriented to person, place, and time.  Skin: Skin is warm and dry. No rash (specifically, no rash on hands or feet.) noted.    ED Course  Procedures (including critical care time) Labs Review Labs Reviewed - No data to display  Imaging Review No results found.   MDM   1. Bronchitis   2. Bilateral lower extremity edema   3. Itching    We'll treat bronchitis with azithromycin and albuterol. Recommended compression stockings for her edema. Recommended nightly application of thick lotion with gloves and socks for itching.    Charm Rings, MD 07/21/14 [phone]

## 2014-07-21 NOTE — Discharge Instructions (Signed)
You have bronchitis. Take azithromycin as prescribed. Use the albuterol every 4 hours as needed for wheezing or cough. You should see improvement in the next 3-5 days. If you develop fevers, difficulty breathing, or are just not getting better, please come back or go to the emergency room.  The swelling is due to weak veins in your leg. This is probably from being pregnant 6 times. Wear compression stockings when you are up and around.  The itching is likely due to dry skin. Apply a thick lotion, such as cocoa butter or Eucerin, at night and wear gloves and socks over that.  Follow-up as needed.

## 2014-10-16 ENCOUNTER — Encounter: Payer: Self-pay | Admitting: Medical Oncology

## 2014-10-16 ENCOUNTER — Emergency Department
Admission: EM | Admit: 2014-10-16 | Discharge: 2014-10-16 | Disposition: A | Discharge status: Home / Self Care | Payer: Self-pay | Attending: Emergency Medicine | Admitting: Emergency Medicine

## 2014-10-16 DIAGNOSIS — R1033 Periumbilical pain: Secondary | ICD-10-CM | POA: Insufficient documentation

## 2014-10-16 DIAGNOSIS — B349 Viral infection, unspecified: Secondary | ICD-10-CM | POA: Insufficient documentation

## 2014-10-16 DIAGNOSIS — J029 Acute pharyngitis, unspecified: Secondary | ICD-10-CM | POA: Insufficient documentation

## 2014-10-16 DIAGNOSIS — Z792 Long term (current) use of antibiotics: Secondary | ICD-10-CM | POA: Insufficient documentation

## 2014-10-16 DIAGNOSIS — Z3202 Encounter for pregnancy test, result negative: Secondary | ICD-10-CM | POA: Insufficient documentation

## 2014-10-16 DIAGNOSIS — Z79899 Other long term (current) drug therapy: Secondary | ICD-10-CM | POA: Insufficient documentation

## 2014-10-16 DIAGNOSIS — Z720 Tobacco use: Secondary | ICD-10-CM | POA: Insufficient documentation

## 2014-10-16 LAB — COMPREHENSIVE METABOLIC PANEL
ALK PHOS: 64 U/L (ref 38–126)
ALT: 26 U/L (ref 14–54)
AST: 23 U/L (ref 15–41)
Albumin: 4.8 g/dL (ref 3.5–5.0)
Anion gap: 7 (ref 5–15)
BUN: 15 mg/dL (ref 6–20)
CALCIUM: 9.6 mg/dL (ref 8.9–10.3)
CO2: 27 mmol/L (ref 22–32)
CREATININE: 0.67 mg/dL (ref 0.44–1.00)
Chloride: 105 mmol/L (ref 101–111)
GFR calc Af Amer: >60 mL/min (ref >60)
GFR calc non Af Amer: >60 mL/min (ref >60)
Glucose, Bld: 98 mg/dL (ref 65–99)
Potassium: 3.9 mmol/L (ref 3.5–5.1)
Sodium: 139 mmol/L (ref 135–145)
TOTAL PROTEIN: 7.5 g/dL (ref 6.5–8.1)
Total Bilirubin: 0.7 mg/dL (ref 0.3–1.2)

## 2014-10-16 LAB — URINALYSIS COMPLETE WITH MICROSCOPIC (ARMC ONLY)
Bilirubin Urine: NEGATIVE
Glucose, UA: NEGATIVE mg/dL
Hgb urine dipstick: NEGATIVE
KETONES UR: NEGATIVE mg/dL
Leukocytes, UA: NEGATIVE
NITRITE: NEGATIVE
PROTEIN: NEGATIVE mg/dL
SPECIFIC GRAVITY, URINE: 1.02 (ref 1.005–1.030)
pH: 5 (ref 5.0–8.0)

## 2014-10-16 LAB — CBC
HCT: 42.6 % (ref 35.0–47.0)
Hemoglobin: 14.4 g/dL (ref 12.0–16.0)
MCH: 33.3 pg (ref 26.0–34.0)
MCHC: 33.7 g/dL (ref 32.0–36.0)
MCV: 98.7 fL (ref 80.0–100.0)
PLATELETS: 258 10*3/uL (ref 150–440)
RBC: 4.32 MIL/uL (ref 3.80–5.20)
RDW: 14 % (ref 11.5–14.5)
WBC: 8.1 10*3/uL (ref 3.6–11.0)

## 2014-10-16 LAB — POCT PREGNANCY, URINE: Preg Test, Ur: NEGATIVE

## 2014-10-16 NOTE — Discharge Instructions (Signed)
Abdominal Pain Many things can cause abdominal pain. Usually, abdominal pain is not caused by a disease and will improve without treatment. It can often be observed and treated at home. Your health care provider will do a physical exam and possibly order blood tests and X-rays to help determine the seriousness of your pain. However, in many cases, more time must pass before a clear cause of the pain can be found. Before that point, your health care provider may not know if you need more testing or further treatment. HOME CARE INSTRUCTIONS  Monitor your abdominal pain for any changes. The following actions may help to alleviate any discomfort you are experiencing:  Only take over-the-counter or prescription medicines as directed by your health care provider.  Do not take laxatives unless directed to do so by your health care provider.  Try a clear liquid diet (broth, tea, or water) as directed by your health care provider. Slowly move to a bland diet as tolerated. SEEK MEDICAL CARE IF:  You have unexplained abdominal pain.  You have abdominal pain associated with nausea or diarrhea.  You have pain when you urinate or have a bowel movement.  You experience abdominal pain that wakes you in the night.  You have abdominal pain that is worsened or improved by eating food.  You have abdominal pain that is worsened with eating fatty foods.  You have a fever. SEEK IMMEDIATE MEDICAL CARE IF:   Your pain does not go away within 2 hours.  You keep throwing up (vomiting).  Your pain is felt only in portions of the abdomen, such as the right side or the left lower portion of the abdomen.  You pass bloody or black tarry stools. MAKE SURE YOU:  Understand these instructions.   Will watch your condition.   Will get help right away if you are not doing well or get worse.  Document Released: 10/21/2004 Document Revised: 01/16/2013 Document Reviewed: 09/20/2012 [facility] Patient Information  2015 [place], [place]. This information is not intended to replace advice given to you by your health care provider. Make sure you discuss any questions you have with your health care provider.  Viral Infections A virus is a type of germ. Viruses can cause:  Minor sore throats.  Aches and pains.  Headaches.  Runny nose.  Rashes.  Watery eyes.  Tiredness.  Coughs.  Loss of appetite.  Feeling sick to your stomach (nausea).  Throwing up (vomiting).  Watery poop (diarrhea). HOME CARE   Only take medicines as told by your doctor.  Drink enough water and fluids to keep your pee (urine) clear or pale yellow. Sports drinks are a good choice.  Get plenty of rest and eat healthy. Soups and broths with crackers or rice are fine. GET HELP RIGHT AWAY IF:   You have a very bad headache.  You have shortness of breath.  You have chest pain or neck pain.  You have an unusual rash.  You cannot stop throwing up.  You have watery poop that does not stop.  You cannot keep fluids down.  You or your child has a temperature by mouth above 102 F (38.9 C), not controlled by medicine.  Your baby is older than 3 months with a rectal temperature of 102 F (38.9 C) or higher.  Your baby is 19 months old or younger with a rectal temperature of 100.4 F (38 C) or higher. MAKE SURE YOU:   Understand these instructions.  Will watch this condition.  Will get help right away if you are not doing well or get worse. Document Released: 12/25/2007 Document Revised: 04/05/2011 Document Reviewed: 05/19/2010 [facility] Patient Information 2015 [place], [place]. This information is not intended to replace advice given to you by your health care provider. Make sure you discuss any questions you have with your health care provider.

## 2014-10-16 NOTE — ED Notes (Signed)
Pt ambulatory to triage with reports that she has been having sore throat nvd, x 1 week.

## 2014-10-16 NOTE — ED Notes (Signed)
Pt states ore throat and ear aches and nausea for 1 week, loose bowels, states fevers at night, states her 2 children had strep throat 2 weeks ago

## 2014-10-16 NOTE — ED Provider Notes (Signed)
[facility] Emergency Department Provider Note  ____________________________________________  Time seen: Approximately 5:49 PM  I have reviewed the triage vital signs and the nursing notes.   HISTORY  Chief Complaint Nausea; Fever; and Sore Throat    HPI Erica [name] is a 34 y.o. female without any chronic medical issues who is presenting today for 1 week of nausea, sore throat vomiting, diarrhea and abdominal cramping. The patient has been taking ibuprofen in the evenings for low-grade fevers which she said had been about 100.0. His that her children were also recently sick with strep throat and is concerned that she may have that as well. She also is complaining of pain and pressure in both of her ears. Denies any runny nose.Had one episode of vomiting but is able to tolerate fluids today. Says that her abdominal pain is periumbilical and had a cramp there several days ago which is now resolved.   Past Medical History  Diagnosis Date  . No pertinent past medical history   . Anxiety     as teen  . Abnormal Pap smear   . Depression     as teen  . VBAC, delivered, current hospitalization 05/19/2011  . Headache(784.0)   . GERD (gastroesophageal reflux disease)   . Anxiety     Patient Active Problem List   Diagnosis Date Noted  . URI (upper respiratory infection) 09/23/2010  . Right otitis media 09/23/2010  . Positive pregnancy test 09/23/2010    Past Surgical History  Procedure Laterality Date  . Cesarean section    . Colposcopy    . Wisdom tooth extraction    . Dilation and curettage of uterus  2010    Current Outpatient Rx  Name  Route  Sig  Dispense  Refill  . albuterol (PROVENTIL HFA;VENTOLIN HFA) 108 (90 BASE) MCG/ACT inhaler   Inhalation   Inhale 2 puffs into the lungs every 4 (four) hours as needed for wheezing or shortness of breath.   1 Inhaler   0   . azithromycin (ZITHROMAX Z-PAK) 250 MG tablet      Take 2 pills today, then 1  pill daily until gone.   6 tablet   0   . ipratropium (ATROVENT) 0.06 % nasal spray   Each Nare   Place 2 sprays into both nostrils 4 (four) times daily.   15 mL   1     Allergies Review of patient's allergies indicates no known allergies.  Family History  Problem Relation Age of Onset  . Hypertension Mother   . Seizures Mother   . Hypertension Maternal Grandmother   . Arthritis Maternal Grandmother   . Cancer Maternal Grandmother     breast  . Stroke Maternal Grandfather   . Seizures Brother   . Mental illness Brother     paranoid schizophrenia  . Birth defects Daughter     hole in heart  . Seizures Maternal Aunt   . Seizures Brother   . Seizures Cousin   . Seizures Cousin   . Mental retardation Cousin     Social History Social History  Substance Use Topics  . Smoking status: Current Every Day Smoker -- 1.00 packs/day for 13 years    Types: Cigarettes  . Smokeless tobacco: Never Used  . Alcohol Use: Yes     Comment: rare    Review of Systems Constitutional: [place] Eyes: No visual changes. ENT: As above Cardiovascular: Denies chest pain. Respiratory: Denies shortness of breath. Gastrointestinal:  No constipation.  Genitourinary: Negative for dysuria. Musculoskeletal: Negative for back pain. Skin: Negative for rash. Neurological: Negative for headaches, focal weakness or numbness.  10-point ROS otherwise negative.  ____________________________________________   PHYSICAL EXAM:  VITAL SIGNS: ED Triage Vitals  Enc Vitals Group     BP 10/16/14 1558 126/79 mmHg     Pulse Rate 10/16/14 1558 98     Resp 10/16/14 1558 15     Temp 10/16/14 1558 98.4 F (36.9 C)     Temp Source 10/16/14 1558 Oral     SpO2 10/16/14 1558 98 %     Weight 10/16/14 1558 150 lb (68.04 kg)     Height 10/16/14 1558 5' 3.5" (1.613 m)     Head Cir --      Peak Flow --      Pain Score 10/16/14 1602 0     Pain Loc --      Pain Edu? --      Excl. in GC? --      Constitutional: Alert and oriented. Well appearing and in no acute distress. Eyes: Conjunctivae are normal. PERRL. EOMI. Head: Atraumatic. Normal TMs bilaterally. Nose: No congestion/rhinnorhea. Mouth/Throat: Mucous membranes are moist.  Oropharynx non-erythematous. No pus on the tonsils. No tonsillar swelling. Neck: No stridor.   Cardiovascular: Normal rate, regular rhythm. Grossly normal heart sounds.  Good peripheral circulation. Respiratory: Normal respiratory effort.  No retractions. Lungs CTAB. Gastrointestinal: Soft with mild periumbilical tenderness to palpation. No rebound or guarding. No tenderness to the right lower right upper quadrants.. No distention. No abdominal bruits. No CVA tenderness. Musculoskeletal: No lower extremity tenderness nor edema.  No joint effusions. Neurologic:  Normal speech and language. No gross focal neurologic deficits are appreciated. No gait instability. Skin:  Skin is warm, dry and intact. No rash noted. Psychiatric: Mood and affect are normal. Speech and behavior are normal.  ____________________________________________   LABS (all labs ordered are listed, but only abnormal results are displayed)  Labs Reviewed  URINALYSIS COMPLETEWITH MICROSCOPIC ([facility] ONLY) - Abnormal; Notable for the following:    Color, Urine YELLOW (*)    APPearance CLEAR (*)    Bacteria, UA RARE (*)    Squamous Epithelial / LPF 6-30 (*)    All other components within normal limits  COMPREHENSIVE METABOLIC PANEL  CBC  POC URINE PREG, ED  POCT PREGNANCY, URINE   ____________________________________________  EKG   ____________________________________________  RADIOLOGY   ____________________________________________   PROCEDURES    ____________________________________________   INITIAL IMPRESSION / ASSESSMENT AND PLAN / ED COURSE  Pertinent labs & imaging results that were available during my care of the patient were reviewed by me and considered  in my medical decision making (see chart for details).  Patient likely with viral illness. Offered symptomatic treatment with Zofran and Bentyl but patient says that she does not like to take medicine was more concerned that she may have strep throat like her children. We'll continue supportive care at home with ibuprofen as well as food and drink as tolerated. Discussed return precautions such as increased abdominal pain especially if the pain moves to the right upper and right lower quadrants. Patient understands the plan and is willing to comply. ____________________________________________   FINAL CLINICAL IMPRESSION(S) / ED DIAGNOSES  Acute viral syndrome. Acute abdominal pain. Initial visit.    Myrna Blazer, MD 10/16/14 [phone]

## 2014-12-24 ENCOUNTER — Emergency Department
Admission: EM | Admit: 2014-12-24 | Discharge: 2014-12-24 | Disposition: A | Discharge status: Home / Self Care | Payer: Self-pay | Attending: Emergency Medicine | Admitting: Emergency Medicine

## 2014-12-24 ENCOUNTER — Emergency Department: Payer: Self-pay

## 2014-12-24 DIAGNOSIS — S80812A Abrasion, left lower leg, initial encounter: Secondary | ICD-10-CM | POA: Insufficient documentation

## 2014-12-24 DIAGNOSIS — Y92009 Unspecified place in unspecified non-institutional (private) residence as the place of occurrence of the external cause: Secondary | ICD-10-CM | POA: Insufficient documentation

## 2014-12-24 DIAGNOSIS — T148XXA Other injury of unspecified body region, initial encounter: Secondary | ICD-10-CM

## 2014-12-24 DIAGNOSIS — Y9301 Activity, walking, marching and hiking: Secondary | ICD-10-CM | POA: Insufficient documentation

## 2014-12-24 DIAGNOSIS — Z79899 Other long term (current) drug therapy: Secondary | ICD-10-CM | POA: Insufficient documentation

## 2014-12-24 DIAGNOSIS — J019 Acute sinusitis, unspecified: Secondary | ICD-10-CM

## 2014-12-24 DIAGNOSIS — W2209XA Striking against other stationary object, initial encounter: Secondary | ICD-10-CM | POA: Insufficient documentation

## 2014-12-24 DIAGNOSIS — Y998 Other external cause status: Secondary | ICD-10-CM | POA: Insufficient documentation

## 2014-12-24 DIAGNOSIS — Z792 Long term (current) use of antibiotics: Secondary | ICD-10-CM | POA: Insufficient documentation

## 2014-12-24 DIAGNOSIS — S8012XA Contusion of left lower leg, initial encounter: Secondary | ICD-10-CM

## 2014-12-24 DIAGNOSIS — F17210 Nicotine dependence, cigarettes, uncomplicated: Secondary | ICD-10-CM | POA: Insufficient documentation

## 2014-12-24 MED ORDER — AMOXICILLIN 500 MG PO CAPS
500.0000 mg | ORAL_CAPSULE | Freq: Once | ORAL | Status: AC
  Administered 2014-12-24: 500 mg via ORAL
  Filled 2014-12-24: qty 1

## 2014-12-24 MED ORDER — KETOROLAC TROMETHAMINE 10 MG PO TABS
10.0000 mg | ORAL_TABLET | Freq: Once | ORAL | Status: DC
  Filled 2014-12-24: qty 1

## 2014-12-24 MED ORDER — AMOXICILLIN 500 MG PO CAPS: 500.0000 mg | ORAL_CAPSULE | Freq: Three times a day (TID) | ORAL | Status: DC

## 2014-12-24 MED ORDER — KETOROLAC TROMETHAMINE 10 MG PO TABS: 10.0000 mg | ORAL_TABLET | Freq: Three times a day (TID) | ORAL | Status: DC | PRN

## 2014-12-24 NOTE — ED Provider Notes (Signed)
[facility] Provider Note  ____________________________________________  Time seen: 4:30 AM I have reviewed the triage vital signs and the nursing notes.   HISTORY  Chief Complaint Leg Pain      HPI Erica CourserLisa M [name] is a 34 y.o. female presents with history of left leg injury approximately 5 days ago. Patient states that she was walking home and struck her leg against a table and has had continued pain since that time her pain score 6 out of 10. In addition patient admits to nasal congestion, nonproductive cough patient denies any fever.     Past Medical History  Diagnosis Date  . No pertinent past medical history   . Anxiety     as teen  . Abnormal Pap smear   . Depression     as teen  . VBAC, delivered, current hospitalization 05/19/2011  . Headache(784.0)   . GERD (gastroesophageal reflux disease)   . Anxiety     Patient Active Problem List   Diagnosis Date Noted  . URI (upper respiratory infection) 09/23/2010  . Right otitis media 09/23/2010  . Positive pregnancy test 09/23/2010    Past Surgical History  Procedure Laterality Date  . Cesarean section    . Colposcopy    . Wisdom tooth extraction    . Dilation and curettage of uterus  2010    Current Outpatient Rx  Name  Route  Sig  Dispense  Refill  . albuterol (PROVENTIL HFA;VENTOLIN HFA) 108 (90 BASE) MCG/ACT inhaler   Inhalation   Inhale 2 puffs into the lungs every 4 (four) hours as needed for wheezing or shortness of breath.   1 Inhaler   0   . azithromycin (ZITHROMAX Z-PAK) 250 MG tablet      Take 2 pills today, then 1 pill daily until gone.   6 tablet   0   . ipratropium (ATROVENT) 0.06 % nasal spray   Each Nare   Place 2 sprays into both nostrils 4 (four) times daily.   15 mL   1     Allergies No known drug allergies  Family History  Problem Relation Age of Onset  . Hypertension Mother   . Seizures Mother   . Hypertension Maternal  Grandmother   . Arthritis Maternal Grandmother   . Cancer Maternal Grandmother     breast  . Stroke Maternal Grandfather   . Seizures Brother   . Mental illness Brother     paranoid schizophrenia  . Birth defects Daughter     hole in heart  . Seizures Maternal Aunt   . Seizures Brother   . Seizures Cousin   . Seizures Cousin   . Mental retardation Cousin     Social History Social History  Substance Use Topics  . Smoking status: Current Every Day Smoker -- 1.00 packs/day for 13 years    Types: Cigarettes  . Smokeless tobacco: Never Used  . Alcohol Use: Yes     Comment: rare    Review of Systems  Constitutional: Negative for fever. Eyes: Negative for visual changes. ENT: Negative for sore throat. Cardiovascular: Negative for chest pain. Respiratory: Negative for shortness of breath. Gastrointestinal: Negative for abdominal pain, vomiting and diarrhea. Genitourinary: Negative for dysuria. Musculoskeletal: Negative for back pain. Positive for left leg pain Skin: Negative for rash. Neurological: Negative for headaches, focal weakness or numbness.  10-point ROS otherwise negative.  ____________________________________________   PHYSICAL EXAM:  VITAL SIGNS: ED Triage Vitals  Enc Vitals Group  BP 12/24/14 0240 135/81 mmHg     Pulse Rate 12/24/14 0240 77     Resp 12/24/14 0240 18     Temp 12/24/14 0240 98.3 F (36.8 C)     Temp Source 12/24/14 0246 Oral     SpO2 12/24/14 0240 98 %     Weight 12/24/14 0240 160 lb (72.576 kg)     Height 12/24/14 0240  (1.6 m)     Head Cir --      Peak Flow --      Pain Score 12/24/14 0240 4     Pain Loc --      Pain Edu? --      Excl. in GC? --      Constitutional: Alert and oriented. Well appearing and in no distress. Eyes: Conjunctivae are normal. PERRL. Normal extraocular movements. ENT   Head: Normocephalic and atraumatic.   Nose: No congestion/rhinnorhea.   Mouth/Throat: Mucous membranes are  moist.   Neck: No stridor. Hematological/Lymphatic/Immunilogical: No cervical lymphadenopathy. Cardiovascular: Normal rate, regular rhythm. Normal and symmetric distal pulses are present in all extremities. No murmurs, rubs, or gallops. Respiratory: Normal respiratory effort without tachypnea nor retractions. Breath sounds are clear and equal bilaterally. No wheezes/rales/rhonchi. Gastrointestinal: Soft and nontender. No distention. There is no CVA tenderness. Genitourinary: deferred Musculoskeletal: Nontender with normal range of motion in all extremities. No joint effusions.  Anterior left mid leg contusion with central eschar.  Neurologic:  Normal speech and language. No gross focal neurologic deficits are appreciated. Speech is normal.  Skin:  Skin is warm, dry and intact. No rash noted. Psychiatric: Mood and affect are normal. Speech and behavior are normal. Patient exhibits appropriate insight and judgment.    RADIOLOGY     DG Tibia/Fibula Left (Final result) Result time: 12/24/14 03:14:12   Final result by Rad Results In Interface (12/24/14 03:14:12)   Narrative:   CLINICAL DATA: Left lower leg pain after falling injury 5 days ago. Pain is located in the distal anterior left lower leg.  EXAM: LEFT TIBIA AND FIBULA - 2 VIEW  COMPARISON: None.  FINDINGS: There is no evidence of fracture or other focal bone lesions. Soft tissues are unremarkable.  IMPRESSION: Negative.   Electronically Signed By: Ellery Plunk M.D. On: 12/24/2014 03:14           INITIAL IMPRESSION / ASSESSMENT AND PLAN / ED COURSE  Pertinent labs & imaging results that were available during my care of the patient were reviewed by me and considered in my medical decision making (see chart for details).    ____________________________________________   FINAL CLINICAL IMPRESSION(S) / ED DIAGNOSES  Final diagnoses:  Contusion of left leg, initial encounter  Abrasion   Acute sinusitis, recurrence not specified, unspecified location      [name], MD 12/24/14 [phone]

## 2014-12-24 NOTE — ED Notes (Signed)
Patient ambulatory to triage with steady gait, without difficulty or distress noted; pt c/o left lower leg pain after falling over table 5 days ago

## 2014-12-24 NOTE — Discharge Instructions (Signed)
Contusion A contusion is a deep bruise. Contusions are the result of a blunt injury to tissues and muscle fibers under the skin. The injury causes bleeding under the skin. The skin overlying the contusion may turn blue, purple, or yellow. Minor injuries will give you a painless contusion, but more severe contusions may stay painful and swollen for a few weeks.  CAUSES  This condition is usually caused by a blow, trauma, or direct force to an area of the body. SYMPTOMS  Symptoms of this condition include:  Swelling of the injured area.  Pain and tenderness in the injured area.  Discoloration. The area may have redness and then turn blue, purple, or yellow. DIAGNOSIS  This condition is diagnosed based on a physical exam and medical history. An X-ray, CT scan, or MRI may be needed to determine if there are any associated injuries, such as broken bones (fractures). TREATMENT  Specific treatment for this condition depends on what area of the body was injured. In general, the best treatment for a contusion is resting, icing, applying pressure to (compression), and elevating the injured area. This is often called the RICE strategy. Over-the-counter anti-inflammatory medicines may also be recommended for pain control.  HOME CARE INSTRUCTIONS   Rest the injured area.  If directed, apply ice to the injured area:  Put ice in a plastic bag.  Place a towel between your skin and the bag.  Leave the ice on for 20 minutes, 2-3 times per day.  If directed, apply light compression to the injured area using an elastic bandage. Make sure the bandage is not wrapped too tightly. Remove and reapply the bandage as directed by your health care provider.  If possible, raise (elevate) the injured area above the level of your heart while you are sitting or lying down.  Take over-the-counter and prescription medicines only as told by your health care provider. SEEK MEDICAL CARE IF:  Your symptoms do not  improve after several days of treatment.  Your symptoms get worse.  You have difficulty moving the injured area. SEEK IMMEDIATE MEDICAL CARE IF:   You have severe pain.  You have numbness in a hand or foot.  Your hand or foot turns pale or cold.   This information is not intended to replace advice given to you by your health care provider. Make sure you discuss any questions you have with your health care provider.   Document Released: 10/21/2004 Document Revised: 10/02/2014 Document Reviewed: 05/29/2014 Elsevier Interactive Patient Education 2016 [employer]  Sinusitis, Adult Sinusitis is redness, soreness, and inflammation of the paranasal sinuses. Paranasal sinuses are air pockets within the bones of your face. They are located beneath your eyes, in the middle of your forehead, and above your eyes. In healthy paranasal sinuses, mucus is able to drain out, and air is able to circulate through them by way of your nose. However, when your paranasal sinuses are inflamed, mucus and air can become trapped. This can allow bacteria and other germs to grow and cause infection. Sinusitis can develop quickly and last only a short time (acute) or continue over a long period (chronic). Sinusitis that lasts for more than 12 weeks is considered chronic. CAUSES Causes of sinusitis include:  Allergies.  Structural abnormalities, such as displacement of the cartilage that separates your nostrils (deviated septum), which can decrease the air flow through your nose and sinuses and affect sinus drainage.  Functional abnormalities, such as when the small hairs (cilia) that line your sinuses  and help remove mucus do not work properly or are not present. SIGNS AND SYMPTOMS Symptoms of acute and chronic sinusitis are the same. The primary symptoms are pain and pressure around the affected sinuses. Other symptoms include:  Upper toothache.  Earache.  Headache.  Bad breath.  Decreased sense of  smell and taste.  A cough, which worsens when you are lying flat.  Fatigue.  Fever.  Thick drainage from your nose, which often is green and may contain pus (purulent).  Swelling and warmth over the affected sinuses. DIAGNOSIS Your health care provider will perform a physical exam. During your exam, your health care provider may perform any of the following to help determine if you have acute sinusitis or chronic sinusitis:  Look in your nose for signs of abnormal growths in your nostrils (nasal polyps).  Tap over the affected sinus to check for signs of infection.  View the inside of your sinuses using an imaging device that has a light attached (endoscope). If your health care provider suspects that you have chronic sinusitis, one or more of the following tests may be recommended:  Allergy tests.  Nasal culture. A sample of mucus is taken from your nose, sent to a lab, and screened for bacteria.  Nasal cytology. A sample of mucus is taken from your nose and examined by your health care provider to determine if your sinusitis is related to an allergy. TREATMENT Most cases of acute sinusitis are related to a viral infection and will resolve on their own within 10 days. Sometimes, medicines are prescribed to help relieve symptoms of both acute and chronic sinusitis. These may include pain medicines, decongestants, nasal steroid sprays, or saline sprays. However, for sinusitis related to a bacterial infection, your health care provider will prescribe antibiotic medicines. These are medicines that will help kill the bacteria causing the infection. Rarely, sinusitis is caused by a fungal infection. In these cases, your health care provider will prescribe antifungal medicine. For some cases of chronic sinusitis, surgery is needed. Generally, these are cases in which sinusitis recurs more than 3 times per year, despite other treatments. HOME CARE INSTRUCTIONS  Drink plenty of water. Water  helps thin the mucus so your sinuses can drain more easily.  Use a humidifier.  Inhale steam 3-4 times a day (for example, sit in the bathroom with the shower running).  Apply a warm, moist washcloth to your face 3-4 times a day, or as directed by your health care provider.  Use saline nasal sprays to help moisten and clean your sinuses.  Take medicines only as directed by your health care provider.  If you were prescribed either an antibiotic or antifungal medicine, finish it all even if you start to feel better. SEEK IMMEDIATE MEDICAL CARE IF:  You have increasing pain or severe headaches.  You have nausea, vomiting, or drowsiness.  You have swelling around your face.  You have vision problems.  You have a stiff neck.  You have difficulty breathing.   This information is not intended to replace advice given to you by your health care provider. Make sure you discuss any questions you have with your health care provider.   Document Released: 01/11/2005 Document Revised: 02/01/2014 Document Reviewed: 01/26/2011 Elsevier Interactive Patient Education [employer]

## 2015-01-06 ENCOUNTER — Emergency Department (INDEPENDENT_AMBULATORY_CARE_PROVIDER_SITE_OTHER)
Admission: EM | Admit: 2015-01-06 | Discharge: 2015-01-06 | Disposition: A | Discharge status: Home / Self Care | Payer: Self-pay | Source: Home / Self Care

## 2015-01-06 DIAGNOSIS — J40 Bronchitis, not specified as acute or chronic: Secondary | ICD-10-CM

## 2015-01-06 MED ORDER — ALBUTEROL SULFATE HFA 108 (90 BASE) MCG/ACT IN AERS: 2.0000 | INHALATION_SPRAY | RESPIRATORY_TRACT | Status: DC | PRN

## 2015-01-06 MED ORDER — AZITHROMYCIN 250 MG PO TABS: 250.0000 mg | ORAL_TABLET | Freq: Every day | ORAL | Status: DC

## 2015-01-06 NOTE — Discharge Instructions (Signed)
Upper Respiratory Infection, Adult °Most upper respiratory infections (URIs) are a viral infection of the air passages leading to the lungs. A URI affects the nose, throat, and upper air passages. The most common type of URI is nasopharyngitis and is typically referred to as "the common cold." °URIs run their course and usually go away on their own. Most of the time, a URI does not require medical attention, but sometimes a bacterial infection in the upper airways can follow a viral infection. This is called a secondary infection. Sinus and middle ear infections are common types of secondary upper respiratory infections. °Bacterial pneumonia can also complicate a URI. A URI can worsen asthma and chronic obstructive pulmonary disease (COPD). Sometimes, these complications can require emergency medical care and may be life threatening.  °CAUSES °Almost all URIs are caused by viruses. A virus is a type of germ and can spread from one person to another.  °RISKS FACTORS °You may be at risk for a URI if:  °· You smoke.   °· You have chronic heart or lung disease. °· You have a weakened defense (immune) system.   °· You are very young or very old.   °· You have nasal allergies or asthma. °· You work in crowded or poorly ventilated areas. °· You work in health care facilities or schools. °SIGNS AND SYMPTOMS  °Symptoms typically develop 2-3 days after you come in contact with a cold virus. Most viral URIs last 7-10 days. However, viral URIs from the influenza virus (flu virus) can last 14-18 days and are typically more severe. Symptoms may include:  °· Runny or stuffy (congested) nose.   °· Sneezing.   °· Cough.   °· Sore throat.   °· Headache.   °· Fatigue.   °· Fever.   °· Loss of appetite.   °· Pain in your forehead, behind your eyes, and over your cheekbones (sinus pain). °· Muscle aches.   °DIAGNOSIS  °Your health care provider may diagnose a URI by: °· Physical exam. °· Tests to check that your symptoms are not due to  another condition such as: °· Strep throat. °· Sinusitis. °· Pneumonia. °· Asthma. °TREATMENT  °A URI goes away on its own with time. It cannot be cured with medicines, but medicines may be prescribed or recommended to relieve symptoms. Medicines may help: °· Reduce your fever. °· Reduce your cough. °· Relieve nasal congestion. °HOME CARE INSTRUCTIONS  °· Take medicines only as directed by your health care provider.   °· Gargle warm saltwater or take cough drops to comfort your throat as directed by your health care provider. °· Use a warm mist humidifier or inhale steam from a shower to increase air moisture. This may make it easier to breathe. °· Drink enough fluid to keep your urine clear or pale yellow.   °· Eat soups and other clear broths and maintain good nutrition.   °· Rest as needed.   °· Return to work when your temperature has returned to normal or as your health care provider advises. You may need to stay home longer to avoid infecting others. You can also use a face mask and careful hand washing to prevent spread of the virus. °· Increase the usage of your inhaler if you have asthma.   °· Do not use any tobacco products, including cigarettes, chewing tobacco, or electronic cigarettes. If you need help quitting, ask your health care provider. °PREVENTION  °The best way to protect yourself from getting a cold is to practice good hygiene.  °· Avoid oral or hand contact with people with cold   symptoms.   °· Wash your hands often if contact occurs.   °There is no clear evidence that vitamin C, vitamin E, echinacea, or exercise reduces the chance of developing a cold. However, it is always recommended to get plenty of rest, exercise, and practice good nutrition.  °SEEK MEDICAL CARE IF:  °· You are getting worse rather than better.   °· Your symptoms are not controlled by medicine.   °· You have chills. °· You have worsening shortness of breath. °· You have brown or red mucus. °· You have yellow or brown nasal  discharge. °· You have pain in your face, especially when you bend forward. °· You have a fever. °· You have swollen neck glands. °· You have pain while swallowing. °· You have white areas in the back of your throat. °SEEK IMMEDIATE MEDICAL CARE IF:  °· You have severe or persistent: °¨ Headache. °¨ Ear pain. °¨ Sinus pain. °¨ Chest pain. °· You have chronic lung disease and any of the following: °¨ Wheezing. °¨ Prolonged cough. °¨ Coughing up blood. °¨ A change in your usual mucus. °· You have a stiff neck. °· You have changes in your: °¨ Vision. °¨ Hearing. °¨ Thinking. °¨ Mood. °MAKE SURE YOU:  °· Understand these instructions. °· Will watch your condition. °· Will get help right away if you are not doing well or get worse. °  °This information is not intended to replace advice given to you by your health care provider. Make sure you discuss any questions you have with your health care provider. °  °Document Released: 07/07/2000 Document Revised: 05/28/2014 Document Reviewed: 04/18/2013 °Elsevier Interactive Patient Education ©2016 [employer] Inc[employer] ° °How to Use an Inhaler °Using your inhaler correctly is very important. Good technique will make sure that the medicine reaches your lungs.  °HOW TO USE AN INHALER: °· Take the cap off the inhaler. °· If this is the first time using your inhaler, you need to prime it. Shake the inhaler for 5 seconds. Release four puffs into the air, away from your face. Ask your doctor for help if you have questions. °· Shake the inhaler for 5 seconds. °· Turn the inhaler so the bottle is above the mouthpiece. °· Put your pointer finger on top of the bottle. Your thumb holds the bottom of the inhaler. °· Open your mouth. °· Either hold the inhaler away from your mouth (the width of 2 fingers) or place your lips tightly around the mouthpiece. Ask your doctor which way to use your inhaler. °· Breathe out as much air as possible. °· Breathe in and push down on the bottle 1 time to release  the medicine. You will feel the medicine go in your mouth and throat. °· Continue to take a deep breath in very slowly. Try to fill your lungs. °· After you have breathed in completely, hold your breath for 10 seconds. This will help the medicine to settle in your lungs. If you cannot hold your breath for 10 seconds, hold it for as long as you can before you breathe out. °· Breathe out slowly, through pursed lips. Whistling is an example of pursed lips. °· If your doctor has told you to take more than 1 puff, wait at least 15-30 seconds between puffs. This will help you get the best results from your medicine. Do not use the inhaler more than your doctor tells you to. °· Put the cap back on the inhaler. °· Follow the directions from your doctor or from   the inhaler package about cleaning the inhaler. °If you use more than one inhaler, ask your doctor which inhalers to use and what order to use them in. Ask your doctor to help you figure out when you will need to refill your inhaler.  °If you use a steroid inhaler, always rinse your mouth with water after your last puff, gargle and spit out the water. Do not swallow the water. °GET HELP IF: °· The inhaler medicine only partially helps to stop wheezing or shortness of breath. °· You are having trouble using your inhaler. °· You have some increase in thick spit (phlegm). °GET HELP RIGHT AWAY IF: °· The inhaler medicine does not help your wheezing or shortness of breath or you have tightness in your chest. °· You have dizziness, headaches, or fast heart rate. °· You have chills, fever, or night sweats. °· You have a large increase of thick spit, or your thick spit is bloody. °MAKE SURE YOU:  °· Understand these instructions. °· Will watch your condition. °· Will get help right away if you are not doing well or get worse. °  °This information is not intended to replace advice given to you by your health care provider. Make sure you discuss any questions you have with your  health care provider. °  °Document Released: 10/21/2007 Document Revised: 11/01/2012 Document Reviewed: 08/10/2012 °Elsevier Interactive Patient Education ©2016 [employer] °Acute Bronchitis °Bronchitis is when the airways that extend from the windpipe into the lungs get red, puffy, and painful (inflamed). Bronchitis often causes thick spit (mucus) to develop. This leads to a cough. A cough is the most common symptom of bronchitis. °In acute bronchitis, the condition usually begins suddenly and goes away over time (usually in 2 weeks). Smoking, allergies, and asthma can make bronchitis worse. Repeated episodes of bronchitis may cause more lung problems. °HOME CARE °· Rest. °· Drink enough fluids to keep your pee (urine) clear or pale yellow (unless you need to limit fluids as told by your doctor). °· Only take over-the-counter or prescription medicines as told by your doctor. °· Avoid smoking and secondhand smoke. These can make bronchitis worse. If you are a smoker, think about using nicotine gum or skin patches. Quitting smoking will help your lungs heal faster. °· Reduce the chance of getting bronchitis again by: °¨ Washing your hands often. °¨ Avoiding people with cold symptoms. °¨ Trying not to touch your hands to your mouth, nose, or eyes. °· Follow up with your doctor as told. °GET HELP IF: °Your symptoms do not improve after 1 week of treatment. Symptoms include: °· Cough. °· Fever. °· Coughing up thick spit. °· Body aches. °· Chest congestion. °· Chills. °· Shortness of breath. °· Sore throat. °GET HELP RIGHT AWAY IF:  °· You have an increased fever. °· You have chills. °· You have severe shortness of breath. °· You have bloody thick spit (sputum). °· You throw up (vomit) often. °· You lose too much body fluid (dehydration). °· You have a severe headache. °· You faint. °MAKE SURE YOU:  °· Understand these instructions. °· Will watch your condition. °· Will get help right away if you are not doing well or  get worse. °  °This information is not intended to replace advice given to you by your health care provider. Make sure you discuss any questions you have with your health care provider. °  °Document Released: 06/30/2007 Document Revised: 09/13/2012 Document Reviewed: 07/04/2012 °Elsevier Interactive Patient Education ©2016 [employer]   Inc[employer] ° °

## 2015-01-06 NOTE — ED Provider Notes (Signed)
[name]: [id number]     Arrival date & time 01/06/15  1522 History   None    No chief complaint on file.  (Consider location/radiation/quality/duration/timing/severity/associated sxs/prior Treatment) HPI History obtained from patient:   LOCATION:chest SEVERITY: DURATION: 3 weeks CONTEXT: amoxil for a left lower leg.   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 No pertinent past medical history   . Anxiety     as teen  . Abnormal Pap smear   . Depression     as teen  . VBAC, delivered, current hospitalization 05/19/2011  . Headache(784.0)   . GERD (gastroesophageal reflux disease)   . Anxiety    Past Surgical History  Procedure Laterality Date  . Cesarean section    . Colposcopy    . Wisdom tooth extraction    . Dilation and curettage of uterus  2010   Family History  Problem Relation Age of Onset  . Hypertension Mother   . Seizures Mother   . Hypertension Maternal Grandmother   . Arthritis Maternal Grandmother   . Cancer Maternal Grandmother     breast  . Stroke Maternal Grandfather   . Seizures Brother   . Mental illness Brother     paranoid schizophrenia  . Birth defects Daughter     hole in heart  . Seizures Maternal Aunt   . Seizures Brother   . Seizures Cousin   . Seizures Cousin   . Mental retardation Cousin    Social History  Substance Use Topics  . Smoking status: Current Every Day Smoker -- 1.00 packs/day for 13 years    Types: Cigarettes  . Smokeless tobacco: Never Used  . Alcohol Use: Yes     Comment: rare   OB History    Gravida Para Term Preterm AB TAB SAB Ectopic Multiple Living   7 6 4 2 1  0 1 0 0 6     Review of Systems ROS +'vecough  Denies: HEADACHE, NAUSEA, ABDOMINAL PAIN, CHEST PAIN, CONGESTION, DYSURIA, SHORTNESS OF BREATH  Allergies   Review of patient's allergies indicates no known allergies.  Home Medications   Prior to Admission medications   Medication Sig Start Date End Date Taking? Authorizing Provider  albuterol (PROVENTIL HFA;VENTOLIN HFA) 108 (90 BASE) MCG/ACT inhaler Inhale 2 puffs into the lungs every 4 (four) hours as needed for wheezing or shortness of breath. 07/21/14   Charm RingsErin J Honig, MD  amoxicillin (AMOXIL) 500 MG capsule Take 1 capsule (500 mg total) by mouth 3 (three) times daily. 12/24/14   Darci Currentandolph N Brown, MD  azithromycin (ZITHROMAX Z-PAK) 250 MG tablet Take 2 pills today, then 1 pill daily until gone. 07/21/14   Charm RingsErin J Honig, MD  ipratropium (ATROVENT) 0.06 % nasal spray Place 2 sprays into both nostrils 4 (four) times daily. 02/24/14   Rodolph BongEvan S Corey, MD  ketorolac (TORADOL) 10 MG tablet Take 1 tablet (10 mg total) by mouth every 8 (eight) hours as needed. 12/24/14   Darci Currentandolph N Brown, MD   Meds Ordered and Administered this Visit  Medications - No data to display  LMP 12/03/2014 (Exact Date) No data found.   Physical Exam  Constitutional: She is oriented to person, place, and time. She appears well-developed and well-nourished.  HENT:  Head: Normocephalic and atraumatic.  Eyes: Conjunctivae are normal.  Neck: Neck supple.  Pulmonary/Chest: Effort normal.  Abdominal: Soft. Bowel sounds are normal.  Musculoskeletal: Normal range of motion.  Neurological: She is alert and oriented to person, place, and time.  Skin: Skin is warm and dry.  Psychiatric: She has a normal mood and affect. Her behavior is normal. Thought content normal.    ED Course  Procedures (including critical care time)  Labs Review Labs Reviewed - No data to display  Imaging Review No results found.   Visual Acuity Review  Right Eye Distance:   Left Eye Distance:   Bilateral Distance:    Right Eye Near:   Left Eye Near:    Bilateral Near:         MDM   1. Bronchitis    Prescription for azithromycin  provided. Also albuterol she states that she does not think she'll get that since her boyfriend has an inhaler she can have. She is also requesting a return to work note. Instructions of care provided discharged home in stable condition.  THIS NOTE WAS GENERATED USING A VOICE RECOGNITION SOFTWARE PROGRAM. ALL REASONABLE EFFORTS  WERE MADE TO PROOFREAD THIS DOCUMENT FOR ACCURACY.     Tharon Aquas, PA 01/06/15 1757

## 2015-01-07 NOTE — ED Notes (Signed)
Patient called to request a more affordable Rx , as she cannot afford the Z pack. Discussed w Rosalie GumsF Patrick, who authorized amoxicillin 500 mg PO, BID x 7 days. Discussed w patient the importance of Rx compliance. Called to [facility] [address] in [place], [place]. Spoke directly w pharmacist

## 2015-03-07 ENCOUNTER — Encounter: Payer: Self-pay | Admitting: Emergency Medicine

## 2015-03-07 ENCOUNTER — Emergency Department
Admission: EM | Admit: 2015-03-07 | Discharge: 2015-03-07 | Disposition: A | Discharge status: Home / Self Care | Payer: Medicaid Other | Attending: Emergency Medicine | Admitting: Emergency Medicine

## 2015-03-07 DIAGNOSIS — N390 Urinary tract infection, site not specified: Secondary | ICD-10-CM | POA: Insufficient documentation

## 2015-03-07 DIAGNOSIS — Z79899 Other long term (current) drug therapy: Secondary | ICD-10-CM | POA: Insufficient documentation

## 2015-03-07 DIAGNOSIS — M7981 Nontraumatic hematoma of soft tissue: Secondary | ICD-10-CM | POA: Insufficient documentation

## 2015-03-07 DIAGNOSIS — G8929 Other chronic pain: Secondary | ICD-10-CM | POA: Insufficient documentation

## 2015-03-07 DIAGNOSIS — F17210 Nicotine dependence, cigarettes, uncomplicated: Secondary | ICD-10-CM | POA: Insufficient documentation

## 2015-03-07 DIAGNOSIS — Z3202 Encounter for pregnancy test, result negative: Secondary | ICD-10-CM | POA: Insufficient documentation

## 2015-03-07 DIAGNOSIS — Z792 Long term (current) use of antibiotics: Secondary | ICD-10-CM | POA: Insufficient documentation

## 2015-03-07 DIAGNOSIS — R110 Nausea: Secondary | ICD-10-CM

## 2015-03-07 LAB — CBC
HCT: 42.4 % (ref 35.0–47.0)
Hemoglobin: 14.3 g/dL (ref 12.0–16.0)
MCH: 32.8 pg (ref 26.0–34.0)
MCHC: 33.8 g/dL (ref 32.0–36.0)
MCV: 97 fL (ref 80.0–100.0)
PLATELETS: 272 10*3/uL (ref 150–440)
RBC: 4.37 MIL/uL (ref 3.80–5.20)
RDW: 14 % (ref 11.5–14.5)
WBC: 9.4 10*3/uL (ref 3.6–11.0)

## 2015-03-07 LAB — URINALYSIS COMPLETE WITH MICROSCOPIC (ARMC ONLY)
Bilirubin Urine: NEGATIVE
Glucose, UA: NEGATIVE mg/dL
KETONES UR: NEGATIVE mg/dL
Nitrite: NEGATIVE
PH: 6 (ref 5.0–8.0)
PROTEIN: NEGATIVE mg/dL
SPECIFIC GRAVITY, URINE: 1.005 (ref 1.005–1.030)

## 2015-03-07 LAB — BASIC METABOLIC PANEL
ANION GAP: 6 (ref 5–15)
BUN: 7 mg/dL (ref 6–20)
CALCIUM: 9.1 mg/dL (ref 8.9–10.3)
CO2: 28 mmol/L (ref 22–32)
CREATININE: 0.7 mg/dL (ref 0.44–1.00)
Chloride: 106 mmol/L (ref 101–111)
Glucose, Bld: 93 mg/dL (ref 65–99)
Potassium: 3.3 mmol/L — ABNORMAL LOW (ref 3.5–5.1)
SODIUM: 140 mmol/L (ref 135–145)

## 2015-03-07 LAB — POCT PREGNANCY, URINE: Preg Test, Ur: NEGATIVE

## 2015-03-07 MED ORDER — CEPHALEXIN 500 MG PO CAPS
500.0000 mg | ORAL_CAPSULE | Freq: Two times a day (BID) | ORAL | Status: DC
Start: 2015-03-07 — End: 2015-11-12

## 2015-03-07 MED ORDER — ONDANSETRON HCL 4 MG PO TABS
ORAL_TABLET | ORAL | Status: DC
Start: 2015-03-07 — End: 2015-11-12

## 2015-03-07 NOTE — ED Notes (Signed)
Pt alert and oriented X4, active, cooperative, pt in NAD. RR even and unlabored, color WNL.  Pt informed to return if any life threatening symptoms occur.   

## 2015-03-07 NOTE — Discharge Instructions (Signed)
Believe you are symptoms may be the result of urinary tract infection.  Please take the full course of prescribed antibiotics.  Use the prescribed nausea medicine as needed and as prescribed.  [facility] care doctor.  Drink plenty of clear fluids over the next couple days and try to avoid alcohol.   Urinary Tract Infection Urinary tract infections (UTIs) can develop anywhere along your urinary tract. Your urinary tract is your body's drainage system for removing wastes and extra water. Your urinary tract includes two kidneys, two ureters, a bladder, and a urethra. Your kidneys are a pair of bean-shaped organs. Each kidney is about the size of your fist. They are located below your ribs, one on each side of your spine. CAUSES Infections are caused by microbes, which are microscopic organisms, including fungi, viruses, and bacteria. These organisms are so small that they can only be seen through a microscope. Bacteria are the microbes that most commonly cause UTIs. SYMPTOMS  Symptoms of UTIs may vary by age and gender of the patient and by the location of the infection. Symptoms in young women typically include a frequent and intense urge to urinate and a painful, burning feeling in the bladder or urethra during urination. Older women and men are more likely to be tired, shaky, and weak and have muscle aches and abdominal pain. A fever may mean the infection is in your kidneys. Other symptoms of a kidney infection include pain in your back or sides below the ribs, nausea, and vomiting. DIAGNOSIS To diagnose a UTI, your caregiver will ask you about your symptoms. Your caregiver will also ask you to provide a urine sample. The urine sample will be tested for bacteria and white blood cells. White blood cells are made by your body to help fight infection. TREATMENT  Typically, UTIs can be treated with medication. Because most UTIs are caused by a bacterial  infection, they usually can be treated with the use of antibiotics. The choice of antibiotic and length of treatment depend on your symptoms and the type of bacteria causing your infection. HOME CARE INSTRUCTIONS  If you were prescribed antibiotics, take them exactly as your caregiver instructs you. Finish the medication even if you feel better after you have only taken some of the medication.  Drink enough water and fluids to keep your urine clear or pale yellow.  Avoid caffeine, tea, and carbonated beverages. They tend to irritate your bladder.  Empty your bladder often. Avoid holding urine for long periods of time.  Empty your bladder before and after sexual intercourse.  After a bowel movement, women should cleanse from front to back. Use each tissue only once. SEEK MEDICAL CARE IF:   You have back pain.  You develop a fever.  Your symptoms do not begin to resolve within 3 days. SEEK IMMEDIATE MEDICAL CARE IF:   You have severe back pain or lower abdominal pain.  You develop chills.  You have nausea or vomiting.  You have continued burning or discomfort with urination. MAKE SURE YOU:   Understand these instructions.  Will watch your condition.  Will get help right away if you are not doing well or get worse.   This information is not intended to replace advice given to you by your health care provider. Make sure you discuss any questions you have with your health care provider.   Document Released: 10/21/2004 Document Revised: 10/02/2014 Document Reviewed: 02/19/2011 Elsevier Interactive Patient Education  2016 Else[employer] Inc.  Nausea, Adult Nausea is the feeling that you have an upset stomach or have to vomit. Nausea by itself is not likely a serious concern, but it may be an early sign of more serious medical problems. As nausea gets worse, it can lead to vomiting. If vomiting develops, there is the risk of dehydration.  CAUSES   Viral infections.  Food  poisoning.  Medicines.  Pregnancy.  Motion sickness.  Migraine headaches.  Emotional distress.  Severe pain from any source.  Alcohol intoxication. HOME CARE INSTRUCTIONS  Get plenty of rest.  Ask your caregiver about specific rehydration instructions.  Eat small amounts of food and sip liquids more often.  Take all medicines as told by your caregiver. SEEK MEDICAL CARE IF:  You have not improved after 2 days, or you get worse.  You have a headache. SEEK IMMEDIATE MEDICAL CARE IF:   You have a fever.  You faint.  You keep vomiting or have blood in your vomit.  You are extremely weak or dehydrated.  You have dark or bloody stools.  You have severe chest or abdominal pain. MAKE SURE YOU:  Understand these instructions.  Will watch your condition.  Will get help right away if you are not doing well or get worse.   This information is not intended to replace advice given to you by your health care provider. Make sure you discuss any questions you have with your health care provider.   Document Released: 02/19/2004 Document Revised: 02/01/2014 Document Reviewed: 09/23/2010 Elsevier Interactive Patient Education [employer]

## 2015-03-07 NOTE — ED Notes (Addendum)
Patient ambulatory to triage with steady gait, without difficulty or distress noted; pt reports x week having dark urine with odor accomp by nausea, occas lower back/abd pain (no pain at present)

## 2015-03-07 NOTE — ED Provider Notes (Signed)
[facility] Emergency Department Provider Note  ____________________________________________  Time seen: Approximately 4:27 AM  I have reviewed the triage vital signs and the nursing notes.   HISTORY  Chief Complaint Dysuria, nausea   HPI Erica [name] is a 35 y.o. female with no specific past medical history and no primary care provider who presents with complaints of intermittent lower abdominal and lower back pain 1 week, mild burning dysuria, and dark and foul-smelling urine.  She states she has had urinary tract infections in the past for similar.  She thinks that she might be dehydrated although she said that her urine today was more clear after drinking more fluids yesterday.  The abdominal pain is currently completely absent, as is the low back pain, she says that is intermittent and mild.  She states that she has had it for "years" and that "no doctor can ever figure out what is wrong".  She also states that she has intermittent tingling in her fingers and her legs that has also been happening for years but is currently absent.  The dysuria is mild, intermittent, a burning sensation, and nothing makes it better or worse.  It has been gradual in onset.  She endorses daily tobacco use and frequent alcohol use and does admit to drinking earlier tonight prior to coming to the emergency department.  Past Medical History  Diagnosis Date  . No pertinent past medical history   . Anxiety     as teen  . Abnormal Pap smear   . Depression     as teen  . VBAC, delivered, current hospitalization 05/19/2011  . Headache(784.0)   . GERD (gastroesophageal reflux disease)   . Anxiety     Patient Active Problem List   Diagnosis Date Noted  . URI (upper respiratory infection) 09/23/2010  . Right otitis media 09/23/2010  . Positive pregnancy test 09/23/2010    Past Surgical History  Procedure Laterality Date  . Cesarean section    . Colposcopy    . Wisdom tooth  extraction    . Dilation and curettage of uterus  2010    Current Outpatient Rx  Name  Route  Sig  Dispense  Refill  . albuterol (PROVENTIL HFA;VENTOLIN HFA) 108 (90 BASE) MCG/ACT inhaler   Inhalation   Inhale 2 puffs into the lungs every 4 (four) hours as needed for wheezing or shortness of breath.   1 Inhaler   0   . amoxicillin (AMOXIL) 500 MG capsule   Oral   Take 1 capsule (500 mg total) by mouth 3 (three) times daily.   21 capsule   0   . azithromycin (ZITHROMAX) 250 MG tablet   Oral   Take 1 tablet (250 mg total) by mouth daily. Take first 2 tablets together, then 1 every day until finished.   6 tablet   0              . ipratropium (ATROVENT) 0.06 % nasal spray   Each Nare   Place 2 sprays into both nostrils 4 (four) times daily.   15 mL   1   . ketorolac (TORADOL) 10 MG tablet   Oral   Take 1 tablet (10 mg total) by mouth every 8 (eight) hours as needed.   20 tablet   0   .             Allergies Review of patient's allergies indicates no known allergies.  Family History  Problem Relation Age of  Onset  . Hypertension Mother   . Seizures Mother   . Hypertension Maternal Grandmother   . Arthritis Maternal Grandmother   . Cancer Maternal Grandmother     breast  . Stroke Maternal Grandfather   . Seizures Brother   . Mental illness Brother     paranoid schizophrenia  . Birth defects Daughter     hole in heart  . Seizures Maternal Aunt   . Seizures Brother   . Seizures Cousin   . Seizures Cousin   . Mental retardation Cousin     Social History Social History  Substance Use Topics  . Smoking status: Current Every Day Smoker -- 1.00 packs/day for 13 years    Types: Cigarettes  . Smokeless tobacco: Never Used  . Alcohol Use: Yes     Comment: rare    Review of Systems Constitutional: No fever/chills Eyes: No visual changes. ENT: No sore throat. Cardiovascular: Denies chest pain. Respiratory: Denies shortness of breath. Gastrointestinal:  Chronic intermittent lower abdominal pain.  No nausea, no vomiting.  No diarrhea.  No constipation. Genitourinary: Mild burning dysuria Musculoskeletal: Mild chronic intermittent lower back pain Skin: Negative for rash. Neurological: Negative for headaches, focal weakness or numbness.  10-point ROS otherwise negative.  ____________________________________________   PHYSICAL EXAM:  VITAL SIGNS: ED Triage Vitals  Enc Vitals Group     BP 03/07/15 0122 148/83 mmHg     Pulse Rate 03/07/15 0122 95     Resp 03/07/15 0122 20     Temp 03/07/15 0122 97.6 F (36.4 C)     Temp src --      SpO2 03/07/15 0122 98 %     Weight 03/07/15 0122 145 lb (65.772 kg)     Height 03/07/15 0122  (1.6 m)     Head Cir --      Peak Flow --      Pain Score --      Pain Loc --      Pain Edu? --      Excl. in GC? --     Constitutional: Alert and oriented. Well appearing and in no acute distress. Eyes: Conjunctivae are normal. PERRL. EOMI. Head: Small subacute ecchymosis below the left eye  Nose: No congestion/rhinnorhea. Mouth/Throat: Mucous membranes are moist.  Oropharynx non-erythematous. Neck: No stridor.   Cardiovascular: Normal rate, regular rhythm. Grossly normal heart sounds.  Good peripheral circulation. Respiratory: Normal respiratory effort.  No retractions. Lungs CTAB. Gastrointestinal: Soft and nontender. No distention. No abdominal bruits. No CVA tenderness. Musculoskeletal: No lower extremity tenderness nor edema.  No joint effusions. Neurologic:  Normal speech and language. No gross focal neurologic deficits are appreciated.  Skin:  Skin is warm, dry and intact. No rash noted. Psychiatric: Mood and affect are normal. Speech and behavior are normal.  ____________________________________________   LABS (all labs ordered are listed, but only abnormal results are displayed)  Labs Reviewed  URINALYSIS COMPLETEWITH MICROSCOPIC ([facility] ONLY) - Abnormal; Notable for the following:     Color, Urine STRAW (*)    APPearance CLEAR (*)    Hgb urine dipstick 1+ (*)    Leukocytes, UA TRACE (*)    Bacteria, UA RARE (*)    Squamous Epithelial / LPF 0-5 (*)    All other components within normal limits  BASIC METABOLIC PANEL - Abnormal; Notable for the following:    Potassium 3.3 (*)    All other components within normal limits  URINE CULTURE  CBC  POCT PREGNANCY, URINE  ____________________________________________  EKG  None ____________________________________________  RADIOLOGY   No results found.  ____________________________________________   PROCEDURES  Procedure(s) performed: None  Critical Care performed: No ____________________________________________   INITIAL IMPRESSION / ASSESSMENT AND PLAN / ED COURSE  Pertinent labs & imaging results that were available during my care of the patient were reviewed by me and considered in my medical decision making (see chart for details).  The patient's lab work is unremarkable.  Her urinalysis is unimpressive but she is describing symptoms of UTI.  I will treat her empirically and I sent a urine culture to be sure.  She has no current abdominal pain or tenderness to palpation and she has a symptoms chronically; I do not believe this warrants any imaging at this time.  I had an extensive conversation with her about all of these issues and recommended close outpatient follow-up.  She understands and agrees with the plan.  ____________________________________________  FINAL CLINICAL IMPRESSION(S) / ED DIAGNOSES  Final diagnoses:  UTI (lower urinary tract infection)  Nausea      NEW MEDICATIONS STARTED DURING THIS VISIT:  New Prescriptions   CEPHALEXIN (KEFLEX) 500 MG CAPSULE    Take 1 capsule (500 mg total) by mouth 2 (two) times daily.   ONDANSETRON (ZOFRAN) 4 MG TABLET    Take 1-2 tabs by mouth every 8 hours as needed for nausea/vomiting     Loleta Rose, MD 03/07/15 [phone]

## 2015-03-07 NOTE — ED Notes (Signed)
Pt reports lower abdominal and lower back pain X 1 week, intermittent "tingly feeling in different spots" throughout body. When inquired about why patient came to ER tonight pt states "I don't have insurance and I think I'm dehydrated or have a UTI". Pt reports nausea most morning and intermittently throughout the day. Pt alert and oriented X4, active, cooperative, pt in NAD. RR even and unlabored, color WNL.

## 2015-03-09 LAB — URINE CULTURE: SPECIAL REQUESTS: NORMAL

## 2015-03-22 ENCOUNTER — Emergency Department (HOSPITAL_COMMUNITY)
Admission: EM | Admit: 2015-03-22 | Discharge: 2015-03-22 | Disposition: A | Discharge status: Home / Self Care | Payer: Medicaid Other | Attending: Emergency Medicine | Admitting: Emergency Medicine

## 2015-03-22 ENCOUNTER — Encounter (HOSPITAL_COMMUNITY): Payer: Self-pay | Admitting: *Deleted

## 2015-03-22 ENCOUNTER — Encounter (HOSPITAL_COMMUNITY): Payer: Self-pay | Admitting: Emergency Medicine

## 2015-03-22 ENCOUNTER — Emergency Department (INDEPENDENT_AMBULATORY_CARE_PROVIDER_SITE_OTHER)
Admission: EM | Admit: 2015-03-22 | Discharge: 2015-03-22 | Disposition: A | Discharge status: Home / Self Care | Payer: Self-pay | Source: Home / Self Care | Attending: Family Medicine | Admitting: Family Medicine

## 2015-03-22 DIAGNOSIS — R51 Headache: Secondary | ICD-10-CM | POA: Insufficient documentation

## 2015-03-22 DIAGNOSIS — F17210 Nicotine dependence, cigarettes, uncomplicated: Secondary | ICD-10-CM | POA: Insufficient documentation

## 2015-03-22 DIAGNOSIS — J111 Influenza due to unidentified influenza virus with other respiratory manifestations: Secondary | ICD-10-CM

## 2015-03-22 DIAGNOSIS — R69 Illness, unspecified: Principal | ICD-10-CM

## 2015-03-22 DIAGNOSIS — R05 Cough: Secondary | ICD-10-CM | POA: Insufficient documentation

## 2015-03-22 MED ORDER — NAPROXEN 500 MG PO TABS: 500.0000 mg | ORAL_TABLET | Freq: Two times a day (BID) | ORAL | Status: DC

## 2015-03-22 NOTE — ED Provider Notes (Signed)
[name]: [id number]     Arrival date & time 03/22/15  1301 History   First MD Initiated Contact with Patient 03/22/15 1312     Chief Complaint  Patient presents with  . URI  . Muscle Pain   (Consider location/radiation/quality/duration/timing/severity/associated sxs/prior Treatment) Patient is a 35 y.o. female presenting with URI and musculoskeletal pain. The history is provided by the patient. No language interpreter was used.  URI Presenting symptoms: congestion, cough, fatigue, fever and rhinorrhea   Presenting symptoms: no ear pain and no sore throat   Associated symptoms: sneezing   Muscle Pain Pertinent negatives include no chest pain and no shortness of breath.  Patient presents with complaint of 2 days' fever/aches, watery rhinorrhea. Onset 02/23 am, worsened over the day. Took Ibuprofen and felt somewhat better; then worsening on 02/24.  Mild cough, worsening watery nasal discharge and lacrimation.  Temp 100F at home.    She works in [employer], many sick contacts there. Smokes 1/2 to 1ppd cigarettes. NKDA.  Takes no chronic medications.     Past Medical History  Diagnosis Date  . No pertinent past medical history   . Anxiety     as teen  . Abnormal Pap smear   . Depression     as teen  . VBAC, delivered, current hospitalization 05/19/2011  . Headache(784.0)   . GERD (gastroesophageal reflux disease)   . Anxiety    Past Surgical History  Procedure Laterality Date  . Cesarean section    . Colposcopy    . Wisdom tooth extraction    . Dilation and curettage of uterus  2010   Family History  Problem Relation Age of Onset  . Hypertension Mother   . Seizures Mother   . Hypertension Maternal Grandmother   . Arthritis Maternal Grandmother   . Cancer Maternal Grandmother     breast  . Stroke Maternal Grandfather   . Seizures Brother   . Mental illness Brother     paranoid schizophrenia  . Birth defects Daughter     hole in heart  . Seizures Maternal Aunt   . Seizures  Brother   . Seizures Cousin   . Seizures Cousin   . Mental retardation Cousin    Social History  Substance Use Topics  . Smoking status: Current Every Day Smoker -- 1.00 packs/day for 13 years    Types: Cigarettes  . Smokeless tobacco: Never Used  . Alcohol Use: Yes     Comment: rare   OB History    Gravida Para Term Preterm AB TAB SAB Ectopic Multiple Living   0 1 0 0 6     Review of Systems  Constitutional: Positive for fever, diaphoresis, appetite change and fatigue. Negative for chills.  HENT: Positive for congestion, postnasal drip, rhinorrhea and sneezing. Negative for ear discharge, ear pain, sinus pressure and sore throat.   Respiratory: Positive for cough. Negative for shortness of breath.   Cardiovascular: Negative for chest pain.  Endocrine: Negative for polyuria.  Genitourinary: Positive for dysuria. Negative for urgency, frequency, hematuria, difficulty urinating and pelvic pain.  All other systems reviewed and are negative.   Allergies  Review of patient's allergies indicates no known allergies.  Home Medications   Prior to Admission medications   Medication Sig Start Date End Date Taking? Authorizing Provider  cephALEXin (KEFLEX) 500 MG capsule Take 1 capsule (500 mg total) by mouth 2 (two) times daily. 03/07/15  Yes Loleta Rose, MD  albuterol (PROVENTIL HFA;VENTOLIN  HFA) 108 (90 BASE) MCG/ACT inhaler Inhale 2 puffs into the lungs every 4 (four) hours as needed for wheezing or shortness of breath. 01/06/15   Tharon Aquas, PA  amoxicillin (AMOXIL) 500 MG capsule Take 1 capsule (500 mg total) by mouth 3 (three) times daily. 12/24/14   [name], MD  azithromycin (ZITHROMAX) 250 MG tablet Take 1 tablet (250 mg total) by mouth daily. Take first 2 tablets together, then 1 every day until finished. 01/06/15   Tharon Aquas, PA  ipratropium (ATROVENT) 0.06 % nasal spray Place 2 sprays into both nostrils 4 (four) times daily. 02/24/14   Rodolph Bong,  MD  ketorolac (TORADOL) 10 MG tablet Take 1 tablet (10 mg total) by mouth every 8 (eight) hours as needed. 12/24/14   [name], MD  naproxen (NAPROSYN) 500 MG tablet Take 1 tablet (500 mg total) by mouth 2 (two) times daily with a meal. 03/22/15   Barbaraann Barthel, MD  ondansetron [facility]) 4 MG tablet Take 1-2 tabs by mouth every 8 hours as needed for nausea/vomiting 03/07/15   Loleta Rose, MD   Meds Ordered and Administered this Visit  Medications - No data to display  BP 123/76 mmHg  Pulse 80  Temp(Src) 98.2 F (36.8 C)  Resp 19  SpO2 97%  LMP 03/22/2015 No data found.   Physical Exam  Constitutional: She appears well-developed and well-nourished. No distress.  Mild ill appearing as with flu  HENT:  Head: Normocephalic and atraumatic.  Right Ear: External ear normal.  Left Ear: External ear normal.  Injected oropharynx; no exudates. Cobblestoning.   Eyes:  Injected conjunctivae; PERRL. EOMI.   Neck: Normal range of motion. Neck supple. No thyromegaly present.  Shotty anterior cervical adenopathy. Negative for maxillary or frontal sinus tenderness.   Cardiovascular: Normal rate, regular rhythm and normal heart sounds.   No murmur heard. Pulmonary/Chest: Effort normal and breath sounds normal. No stridor. No respiratory distress. She has no wheezes. She has no rales. She exhibits no tenderness.  Abdominal: Soft. Bowel sounds are normal.  Musculoskeletal:  No point tenderness over vertebral processes in LS spine.  General paraspinous tenderness across lower lumbar/sacral region (no focality).   Lymphadenopathy:    She has cervical adenopathy.  Neurological:  Hip flexion symmetric and intact.  DTRs patellar symmetric and full.   Skin: Skin is warm and dry. She is not diaphoretic.    ED Course  Procedures (including critical care time)  Labs Review Labs Reviewed - No data to display  Imaging Review No results found.   Visual Acuity Review  Right Eye Distance:    Left Eye Distance:   Bilateral Distance:    Right Eye Near:   Left Eye Near:    Bilateral Near:         MDM   1. Influenza-like illness    Patient with apparent ILI; supportive care. Discussed pros-cons of antiviral therapy in this otherwise healthy 35 yr old. Supportive care, indications for return to [facility].   Paula Compton, MD    Barbaraann Barthel, MD 03/22/15 [phone]

## 2015-03-22 NOTE — Discharge Instructions (Signed)
It is a pleasure to see you today.  As we discussed, I believe there is a reasonable chance that you have Influenza (the flu).    Hand hygiene, increased liquids, rest.    Anti inflammatories (either ibuprofen 400-800mg  every 6 hours, OR Naproxen  every 12 hours, with food).    Mucinex DM otc, 1 tablet by mouth every 12 hours.   Follow up at Urgent Care if worsening fevers or shortness of breath, or with other problems or concerns.

## 2015-03-22 NOTE — ED Notes (Signed)
Pt c/o generalized body aches, headache, cough x 2 days.

## 2015-03-22 NOTE — ED Notes (Signed)
Patient reports cough, sneezing, fever, and runny nose x 2 days with muscle aches. Patient reports taking ibuprofen for discomfort and fever. Patient also reports headache.

## 2015-03-22 NOTE — ED Notes (Signed)
PT called x 3 ,Sub waiting room also checked for PT.

## 2015-03-31 ENCOUNTER — Emergency Department (HOSPITAL_COMMUNITY)
Admission: EM | Admit: 2015-03-31 | Discharge: 2015-03-31 | Disposition: A | Discharge status: Home / Self Care | Payer: Self-pay | Source: Home / Self Care

## 2015-04-08 ENCOUNTER — Emergency Department (INDEPENDENT_AMBULATORY_CARE_PROVIDER_SITE_OTHER)
Admission: EM | Admit: 2015-04-08 | Discharge: 2015-04-08 | Disposition: A | Discharge status: Home / Self Care | Payer: Self-pay | Source: Home / Self Care | Attending: Family Medicine | Admitting: Family Medicine

## 2015-04-08 ENCOUNTER — Other Ambulatory Visit (HOSPITAL_COMMUNITY)
Admission: RE | Admit: 2015-04-08 | Discharge: 2015-04-08 | Disposition: A | Discharge status: Home / Self Care | Payer: Self-pay | Source: Ambulatory Visit | Attending: Family Medicine | Admitting: Family Medicine

## 2015-04-08 ENCOUNTER — Emergency Department (INDEPENDENT_AMBULATORY_CARE_PROVIDER_SITE_OTHER): Payer: Self-pay

## 2015-04-08 ENCOUNTER — Encounter (HOSPITAL_COMMUNITY): Payer: Self-pay | Admitting: Emergency Medicine

## 2015-04-08 DIAGNOSIS — K5900 Constipation, unspecified: Secondary | ICD-10-CM

## 2015-04-08 DIAGNOSIS — N760 Acute vaginitis: Secondary | ICD-10-CM

## 2015-04-08 DIAGNOSIS — Z113 Encounter for screening for infections with a predominantly sexual mode of transmission: Secondary | ICD-10-CM | POA: Insufficient documentation

## 2015-04-08 DIAGNOSIS — Z202 Contact with and (suspected) exposure to infections with a predominantly sexual mode of transmission: Secondary | ICD-10-CM

## 2015-04-08 DIAGNOSIS — B3731 Acute candidiasis of vulva and vagina: Secondary | ICD-10-CM

## 2015-04-08 DIAGNOSIS — B373 Candidiasis of vulva and vagina: Secondary | ICD-10-CM

## 2015-04-08 DIAGNOSIS — B9689 Other specified bacterial agents as the cause of diseases classified elsewhere: Secondary | ICD-10-CM

## 2015-04-08 DIAGNOSIS — A499 Bacterial infection, unspecified: Secondary | ICD-10-CM

## 2015-04-08 LAB — POCT URINALYSIS DIP (DEVICE)
BILIRUBIN URINE: NEGATIVE
GLUCOSE, UA: NEGATIVE mg/dL
KETONES UR: NEGATIVE mg/dL
Nitrite: NEGATIVE
Protein, ur: NEGATIVE mg/dL
Urobilinogen, UA: 0.2 mg/dL (ref 0.0–1.0)
pH: 5.5 (ref 5.0–8.0)

## 2015-04-08 LAB — POCT PREGNANCY, URINE: PREG TEST UR: NEGATIVE

## 2015-04-08 MED ORDER — METRONIDAZOLE 500 MG PO TABS: 500.0000 mg | ORAL_TABLET | Freq: Three times a day (TID) | ORAL | Status: DC

## 2015-04-08 MED ORDER — CEPHALEXIN 500 MG PO CAPS: 500.0000 mg | ORAL_CAPSULE | Freq: Four times a day (QID) | ORAL | Status: DC

## 2015-04-08 MED ORDER — FLUCONAZOLE 200 MG PO TABS
200.0000 mg | ORAL_TABLET | Freq: Every day | ORAL | Status: AC
Start: 2015-04-08 — End: 2015-04-15

## 2015-04-08 MED ORDER — BISACODYL 5 MG PO TBEC: 5.0000 mg | DELAYED_RELEASE_TABLET | Freq: Two times a day (BID) | ORAL | Status: DC

## 2015-04-08 NOTE — ED Notes (Signed)
[facility] with a complaint of a possible exposure to an STD with a new sex partner and she stated that she has a vaginal discharge and odor x 2 days. The patient stated that she has tried Monistat OTC without good results. The patient also complained on a sore on her hand that she attributes to cleaning supplies.

## 2015-04-08 NOTE — Discharge Instructions (Signed)
Constipation, Adult Constipation is when a person:  Poops (has a bowel movement) less than 3 times a week.  Has a hard time pooping.  Has poop that is dry, hard, or bigger than normal. HOME CARE   Eat foods with a lot of fiber in them. This includes fruits, vegetables, beans, and whole grains such as brown rice.  Avoid fatty foods and foods with a lot of sugar. This includes french fries, hamburgers, cookies, candy, and soda.  If you are not getting enough fiber from food, take products with added fiber in them (supplements).  Drink enough fluid to keep your pee (urine) clear or pale yellow.  Exercise on a regular basis, or as told by your doctor.  Go to the restroom when you feel like you need to poop. Do not hold it.  Only take medicine as told by your doctor. Do not take medicines that help you poop (laxatives) without talking to your doctor first. GET HELP RIGHT AWAY IF:   You have bright red blood in your poop (stool).  Your constipation lasts more than 4 days or gets worse.  You have belly (abdominal) or butt (rectal) pain.  You have thin poop (as thin as a pencil).  You lose weight, and it cannot be explained. MAKE SURE YOU:   Understand these instructions.  Will watch your condition.  Will get help right away if you are not doing well or get worse.   This information is not intended to replace advice given to you by your health care provider. Make sure you discuss any questions you have with your health care provider.   Monilial Vaginitis Vaginitis in a soreness, swelling and redness (inflammation) of the vagina and vulva. Monilial vaginitis is not a sexually transmitted infection. CAUSES  Yeast vaginitis is caused by yeast (candida) that is normally found in your vagina. With a yeast infection, the candida has overgrown in number to a point that upsets the chemical balance. SYMPTOMS  White, thick vaginal discharge. Swelling, itching, redness and irritation  of the vagina and possibly the lips of the vagina (vulva). Burning or painful urination. Painful intercourse. DIAGNOSIS  Things that may contribute to monilial vaginitis are: Postmenopausal and virginal states. Pregnancy. Infections. Being tired, sick or stressed, especially if you had monilial vaginitis in the past. Diabetes. Good control will help lower the chance. Birth control pills. Tight fitting garments. Using bubble bath, feminine sprays, douches or deodorant tampons. Taking certain medications that kill germs (antibiotics). Sporadic recurrence can occur if you become ill. TREATMENT  Your caregiver will give you medication. There are several kinds of anti monilial vaginal creams and suppositories specific for monilial vaginitis. For recurrent yeast infections, use a suppository or cream in the vagina 2 times a week, or as directed. Anti-monilial or steroid cream for the itching or irritation of the vulva may also be used. Get your caregiver's permission. Painting the vagina with methylene blue solution may help if the monilial cream does not work. Eating yogurt may help prevent monilial vaginitis. HOME CARE INSTRUCTIONS  Finish all medication as prescribed. Do not have sex until treatment is completed or after your caregiver tells you it is okay. Take warm sitz baths. Do not douche. Do not use tampons, especially scented ones. Wear cotton underwear. Avoid tight pants and panty hose. Tell your sexual partner that you have a yeast infection. They should go to their caregiver if they have symptoms such as mild rash or itching. Your sexual partner should be  treated as well if your infection is difficult to eliminate. Practice safer sex. Use condoms. Some vaginal medications cause latex condoms to fail. Vaginal medications that harm condoms are: Cleocin cream. Butoconazole (Femstat). Terconazole (Terazol) vaginal suppository. Miconazole (Monistat) (may be purchased over the  counter). SEEK MEDICAL CARE IF:  You have a temperature by mouth above 102 F (38.9 C). The infection is getting worse after 2 days of treatment. The infection is not getting better after 3 days of treatment. You develop blisters in or around your vagina. You develop vaginal bleeding, and it is not your menstrual period. You have pain when you urinate. You develop intestinal problems. You have pain with sexual intercourse.   This information is not intended to replace advice given to you by your health care provider. Make sure you discuss any questions you have with your health care provider.   Document Released: 10/21/2004 Document Revised: 04/05/2011 Document Reviewed: 07/15/2014 Elsevier Interactive Patient Education 2016 Elsevier Inc. Bacterial Vaginosis Bacterial vaginosis is a vaginal infection that occurs when the normal balance of bacteria in the vagina is disrupted. It results from an overgrowth of certain bacteria. This is the most common vaginal infection in women of childbearing age. Treatment is important to prevent complications, especially in pregnant women, as it can cause a premature delivery. CAUSES  Bacterial vaginosis is caused by an increase in harmful bacteria that are normally present in smaller amounts in the vagina. Several different kinds of bacteria can cause bacterial vaginosis. However, the reason that the condition develops is not fully understood. RISK FACTORS Certain activities or behaviors can put you at an increased risk of developing bacterial vaginosis, including: Having a new sex partner or multiple sex partners. Douching. Using an intrauterine device (IUD) for contraception. Women do not get bacterial vaginosis from toilet seats, bedding, swimming pools, or contact with objects around them. SIGNS AND SYMPTOMS  Some women with bacterial vaginosis have no signs or symptoms. Common symptoms include: Grey vaginal discharge. A fishlike odor with  discharge, especially after sexual intercourse. Itching or burning of the vagina and vulva. Burning or pain with urination. DIAGNOSIS  Your health care provider will take a medical history and examine the vagina for signs of bacterial vaginosis. A sample of vaginal fluid may be taken. Your health care provider will look at this sample under a microscope to check for bacteria and abnormal cells. A vaginal pH test may also be done.  TREATMENT  Bacterial vaginosis may be treated with antibiotic medicines. These may be given in the form of a pill or a vaginal cream. A second round of antibiotics may be prescribed if the condition comes back after treatment. Because bacterial vaginosis increases your risk for sexually transmitted diseases, getting treated can help reduce your risk for chlamydia, gonorrhea, HIV, and herpes. HOME CARE INSTRUCTIONS  Only take over-the-counter or prescription medicines as directed by your health care provider. If antibiotic medicine was prescribed, take it as directed. Make sure you finish it even if you start to feel better. Tell all sexual partners that you have a vaginal infection. They should see their health care provider and be treated if they have problems, such as a mild rash or itching. During treatment, it is important that you follow these instructions: Avoid sexual activity or use condoms correctly. Do not douche. Avoid alcohol as directed by your health care provider. Avoid breastfeeding as directed by your health care provider. SEEK MEDICAL CARE IF:  Your symptoms are not improving after 3  days of treatment. You have increased discharge or pain. You have a fever. MAKE SURE YOU:  Understand these instructions. Will watch your condition. Will get help right away if you are not doing well or get worse. FOR MORE INFORMATION  Centers for Disease Control and Prevention, Division of STD Prevention: SolutionApps.co.za American Sexual Health Association (ASHA):  www.ashastd.org    This information is not intended to replace advice given to you by your health care provider. Make sure you discuss any questions you have with your health care provider.   Document Released: 01/11/2005 Document Revised: 02/01/2014 Document Reviewed: 08/23/2012 Elsevier Interactive Patient Education [employer]

## 2015-04-09 LAB — CERVICOVAGINAL ANCILLARY ONLY
Chlamydia: NEGATIVE
Neisseria Gonorrhea: NEGATIVE
Wet Prep (BD Affirm): NEGATIVE

## 2015-04-09 NOTE — ED Provider Notes (Signed)
[name]: [id number]     Arrival date & time 04/08/15  1512 History   First MD Initiated Contact with Patient 04/08/15 1713     Chief Complaint  Patient presents with  . Vaginal Discharge  . Exposure to STD   (Consider location/radiation/quality/duration/timing/severity/associated sxs/prior Treatment) HPI Patient with multiple complaints:  Vaginal discharge, std expoure, skin rash. Recent new sexual partner, now discharge. Wants to be evaluated for STD. Also red spots on hands, that she states are itchy. Not having good bowel movements, thinks she may be impacted. Past Medical History  Diagnosis Date  . No pertinent past medical history   . Anxiety     as teen  . Abnormal Pap smear   . Depression     as teen  . VBAC, delivered, current hospitalization 05/19/2011  . Headache(784.0)   . GERD (gastroesophageal reflux disease)   . Anxiety    Past Surgical History  Procedure Laterality Date  . Cesarean section    . Colposcopy    . Wisdom tooth extraction    . Dilation and curettage of uterus  2010   Family History  Problem Relation Age of Onset  . Hypertension Mother   . Seizures Mother   . Hypertension Maternal Grandmother   . Arthritis Maternal Grandmother   . Cancer Maternal Grandmother     breast  . Stroke Maternal Grandfather   . Seizures Brother   . Mental illness Brother     paranoid schizophrenia  . Birth defects Daughter     hole in heart  . Seizures Maternal Aunt   . Seizures Brother   . Seizures Cousin   . Seizures Cousin   . Mental retardation Cousin    Social History  Substance Use Topics  . Smoking status: Current Every Day Smoker -- 1.00 packs/day for 13 years    Types: Cigarettes  . Smokeless tobacco: Never Used  . Alcohol Use: Yes     Comment: rare   OB History    Gravida Para Term Preterm AB TAB SAB Ectopic Multiple Living   0 1 0 0 6     Review of Systems Vaginal discharge, std exposure, Allergies  Review of patient's allergies  indicates no known allergies.  Home Medications   Prior to Admission medications   Medication Sig Start Date End Date Taking? Authorizing Provider  albuterol (PROVENTIL HFA;VENTOLIN HFA) 108 (90 BASE) MCG/ACT inhaler Inhale 2 puffs into the lungs every 4 (four) hours as needed for wheezing or shortness of breath. 01/06/15   Tharon Aquas, PA  amoxicillin (AMOXIL) 500 MG capsule Take 1 capsule (500 mg total) by mouth 3 (three) times daily. 12/24/14   [name], MD  azithromycin (ZITHROMAX) 250 MG tablet Take 1 tablet (250 mg total) by mouth daily. Take first 2 tablets together, then 1 every day until finished. 01/06/15   Tharon Aquas, PA  bisacodyl (DULCOLAX) 5 MG EC tablet Take 1 tablet (5 mg total) by mouth 2 (two) times daily. 04/08/15   Tharon Aquas, PA  cephALEXin (KEFLEX) 500 MG capsule Take 1 capsule (500 mg total) by mouth 2 (two) times daily. 03/07/15   Loleta Rose, MD  cephALEXin (KEFLEX) 500 MG capsule Take 1 capsule (500 mg total) by mouth 4 (four) times daily. 04/08/15   Tharon Aquas, PA  fluconazole (DIFLUCAN) 200 MG tablet Take 1 tablet (200 mg total) by mouth daily. 04/08/15 04/15/15  Tharon Aquas, PA  ipratropium (ATROVENT)  0.06 % nasal spray Place 2 sprays into both nostrils 4 (four) times daily. 02/24/14   Rodolph Bong, MD  ketorolac (TORADOL) 10 MG tablet Take 1 tablet (10 mg total) by mouth every 8 (eight) hours as needed. 12/24/14   [name], MD  metroNIDAZOLE (FLAGYL) 500 MG tablet Take 1 tablet (500 mg total) by mouth 3 (three) times daily. 04/08/15   Tharon Aquas, PA  naproxen (NAPROSYN) 500 MG tablet Take 1 tablet (500 mg total) by mouth 2 (two) times daily with a meal. 03/22/15   Barbaraann Barthel, MD  ondansetron [facility]) 4 MG tablet Take 1-2 tabs by mouth every 8 hours as needed for nausea/vomiting 03/07/15   Loleta Rose, MD   Meds Ordered and Administered this Visit  Medications - No data to display  BP 127/66 mmHg  Pulse 75  Temp(Src) 98.2 F  (36.8 C) (Oral)  Resp 20  SpO2 100%  LMP 03/22/2015 No data found.   Physical Exam  Constitutional: She appears well-developed and well-nourished. No distress.  HENT:  Head: Normocephalic and atraumatic.  Pulmonary/Chest: Effort normal.  Abdominal: Soft. Bowel sounds are normal.  Genitourinary: Vaginal discharge found.  Nurse chaperone present . Exam performed with patients permission Outer labia thick white discharge.  Malodorous discharge spilling from vagina as speculum is inserted. No adnexal tenderness  Skin: She is not diaphoretic.    ED Course  Procedures (including critical care time)  Labs Review Labs Reviewed  POCT URINALYSIS DIP (DEVICE) - Abnormal; Notable for the following:    Hgb urine dipstick TRACE (*)    Leukocytes, UA LARGE (*)    All other components within normal limits  POCT PREGNANCY, URINE  CERVICOVAGINAL ANCILLARY ONLY    Imaging Review Dg Abd 1 View  04/08/2015  CLINICAL DATA:  Constipated, bowel impaction? Chronic symptoms for over a year. History of bowel impaction. EXAM: ABDOMEN - 1 VIEW COMPARISON:  CT abdomen dated 06/07/2012. FINDINGS: There is a moderate amount of stool throughout the nondistended colon, most prominent within the right colon and rectal vault. Overall bowel gas pattern is nonobstructive. No evidence of free intraperitoneal air. No evidence of soft tissue mass or abnormal fluid collection. Small calcifications within the pelvis are likely benign vascular phleboliths. Osseous structures are unremarkable. IMPRESSION: 1. Nonobstructive bowel gas pattern. 2. Moderate amount of stool throughout the colon, most prominent within the right colon and rectal vault. Electronically Signed   By: Bary Richard M.D.   On: 04/08/2015 18:06     Visual Acuity Review  Right Eye Distance:   Left Eye Distance:   Bilateral Distance:    Right Eye Near:   Left Eye Near:    Bilateral Near:       Discussion of XR findings with patient RX  keflex, diflucan, flagyl,dulcolax  MDM   1. Constipation, unspecified constipation type   2. STD exposure   3. BV (bacterial vaginosis)   4. Vaginal candidiasis     Patient is reassured that there are no issues that require transfer to higher level of care at this time or additional tests. Patient is advised to continue home symptomatic treatment. Patient is advised that if there are new or worsening symptoms to attend the emergency department, contact primary care provider, or return to UC. Instructions of care provided discharged home in stable condition. Return to work/school note provided.   THIS NOTE WAS GENERATED USING A VOICE RECOGNITION SOFTWARE PROGRAM. ALL REASONABLE EFFORTS  WERE MADE TO PROOFREAD THIS  DOCUMENT FOR ACCURACY.  I have verbally reviewed the discharge instructions with the patient. A printed AVS was given to the patient.  All questions were answered prior to discharge.      Tharon AquasFrank C Terilynn Buresh, [place] 04/09/15 [phone]

## 2015-04-13 ENCOUNTER — Emergency Department
Admission: EM | Admit: 2015-04-13 | Discharge: 2015-04-13 | Disposition: A | Discharge status: Home / Self Care | Payer: Medicaid Other | Attending: Emergency Medicine | Admitting: Emergency Medicine

## 2015-04-13 ENCOUNTER — Encounter: Payer: Self-pay | Admitting: Medical Oncology

## 2015-04-13 DIAGNOSIS — Z79899 Other long term (current) drug therapy: Secondary | ICD-10-CM | POA: Insufficient documentation

## 2015-04-13 DIAGNOSIS — K0889 Other specified disorders of teeth and supporting structures: Secondary | ICD-10-CM | POA: Insufficient documentation

## 2015-04-13 DIAGNOSIS — F17210 Nicotine dependence, cigarettes, uncomplicated: Secondary | ICD-10-CM | POA: Insufficient documentation

## 2015-04-13 DIAGNOSIS — Z792 Long term (current) use of antibiotics: Secondary | ICD-10-CM | POA: Insufficient documentation

## 2015-04-13 DIAGNOSIS — K029 Dental caries, unspecified: Secondary | ICD-10-CM

## 2015-04-13 DIAGNOSIS — F329 Major depressive disorder, single episode, unspecified: Secondary | ICD-10-CM | POA: Insufficient documentation

## 2015-04-13 DIAGNOSIS — F419 Anxiety disorder, unspecified: Secondary | ICD-10-CM | POA: Insufficient documentation

## 2015-04-13 DIAGNOSIS — I10 Essential (primary) hypertension: Secondary | ICD-10-CM | POA: Insufficient documentation

## 2015-04-13 DIAGNOSIS — Z791 Long term (current) use of non-steroidal anti-inflammatories (NSAID): Secondary | ICD-10-CM | POA: Insufficient documentation

## 2015-04-13 MED ORDER — TRAMADOL HCL 50 MG PO TABS: 50.0000 mg | ORAL_TABLET | Freq: Four times a day (QID) | ORAL | Status: DC | PRN

## 2015-04-13 MED ORDER — LIDOCAINE VISCOUS 2 % MT SOLN
15.0000 mL | Freq: Once | OROMUCOSAL | Status: AC
  Administered 2015-04-13: 15 mL via OROMUCOSAL
  Filled 2015-04-13: qty 15

## 2015-04-13 MED ORDER — TRAMADOL HCL 50 MG PO TABS
50.0000 mg | ORAL_TABLET | Freq: Once | ORAL | Status: AC
  Administered 2015-04-13: 50 mg via ORAL
  Filled 2015-04-13: qty 1

## 2015-04-13 MED ORDER — AMOXICILLIN 500 MG PO CAPS: 500.0000 mg | ORAL_CAPSULE | Freq: Three times a day (TID) | ORAL | Status: DC

## 2015-04-13 MED ORDER — IBUPROFEN 800 MG PO TABS: 800.0000 mg | ORAL_TABLET | Freq: Three times a day (TID) | ORAL | Status: DC | PRN

## 2015-04-13 NOTE — ED Provider Notes (Signed)
[facility] Emergency Department Provider Note  ____________________________________________  Time seen: Approximately 5:29 PM  I have reviewed the triage vital signs and the nursing notes.   HISTORY  Chief Complaint Dental Pain    HPI Erica [name] is a 35 y.o. female patient complaining of right lower dental pain for 2 days. Patient has a history of dental caries has not follow-up with dentist. Patient state is some mild swelling but denies any fever.Patient states she's taking over-the-counter ibuprofen for the past 2 days with only mild relief. Patient rated the pain as a 3/10. Patient described a pain as dull.   Past Medical History  Diagnosis Date  . No pertinent past medical history   . Anxiety     as teen  . Abnormal Pap smear   . Depression     as teen  . VBAC, delivered, current hospitalization 05/19/2011  . Headache(784.0)   . GERD (gastroesophageal reflux disease)   . Anxiety     Patient Active Problem List   Diagnosis Date Noted  . URI (upper respiratory infection) 09/23/2010  . Right otitis media 09/23/2010  . Positive pregnancy test 09/23/2010    Past Surgical History  Procedure Laterality Date  . Cesarean section    . Colposcopy    . Wisdom tooth extraction    . Dilation and curettage of uterus  2010    Current Outpatient Rx  Name  Route  Sig  Dispense  Refill  . albuterol (PROVENTIL HFA;VENTOLIN HFA) 108 (90 BASE) MCG/ACT inhaler   Inhalation   Inhale 2 puffs into the lungs every 4 (four) hours as needed for wheezing or shortness of breath.   1 Inhaler   0   . amoxicillin (AMOXIL) 500 MG capsule   Oral   Take 1 capsule (500 mg total) by mouth 3 (three) times daily.   21 capsule   0   . amoxicillin (AMOXIL) 500 MG capsule   Oral   Take 1 capsule (500 mg total) by mouth 3 (three) times daily.   30 capsule   0   . azithromycin (ZITHROMAX) 250 MG tablet   Oral   Take 1 tablet (250 mg total) by mouth daily. Take  first 2 tablets together, then 1 every day until finished.   6 tablet   0   . bisacodyl (DULCOLAX) 5 MG EC tablet   Oral   Take 1 tablet (5 mg total) by mouth 2 (two) times daily.   14 tablet   0   . cephALEXin (KEFLEX) 500 MG capsule   Oral   Take 1 capsule (500 mg total) by mouth 2 (two) times daily.   14 capsule   0   . cephALEXin (KEFLEX) 500 MG capsule   Oral   Take 1 capsule (500 mg total) by mouth 4 (four) times daily.   21 capsule   0   . fluconazole (DIFLUCAN) 200 MG tablet   Oral   Take 1 tablet (200 mg total) by mouth daily.   3 tablet   0   . ibuprofen (ADVIL,MOTRIN) 800 MG tablet   Oral   Take 1 tablet (800 mg total) by mouth every 8 (eight) hours as needed.   30 tablet   0   . ipratropium (ATROVENT) 0.06 % nasal spray   Each Nare   Place 2 sprays into both nostrils 4 (four) times daily.   15 mL   1   . ketorolac (TORADOL) 10 MG tablet  Oral   Take 1 tablet (10 mg total) by mouth every 8 (eight) hours as needed.   20 tablet   0   . metroNIDAZOLE (FLAGYL) 500 MG tablet   Oral   Take 1 tablet (500 mg total) by mouth 3 (three) times daily.   21 tablet   0   . naproxen (NAPROSYN) 500 MG tablet   Oral   Take 1 tablet (500 mg total) by mouth 2 (two) times daily with a meal.   30 tablet   0   . ondansetron (ZOFRAN) 4 MG tablet      Take 1-2 tabs by mouth every 8 hours as needed for nausea/vomiting   30 tablet   0   . traMADol (ULTRAM) 50 MG tablet   Oral   Take 1 tablet (50 mg total) by mouth every 6 (six) hours as needed for moderate pain.   12 tablet   0     Allergies Review of patient's allergies indicates no known allergies.  Family History  Problem Relation Age of Onset  . Hypertension Mother   . Seizures Mother   . Hypertension Maternal Grandmother   . Arthritis Maternal Grandmother   . Cancer Maternal Grandmother     breast  . Stroke Maternal Grandfather   . Seizures Brother   . Mental illness Brother     paranoid  schizophrenia  . Birth defects Daughter     hole in heart  . Seizures Maternal Aunt   . Seizures Brother   . Seizures Cousin   . Seizures Cousin   . Mental retardation Cousin     Social History Social History  Substance Use Topics  . Smoking status: Current Every Day Smoker -- 1.00 packs/day for 13 years    Types: Cigarettes  . Smokeless tobacco: Never Used  . Alcohol Use: Yes     Comment: rare    Review of Systems Constitutional: No fever/chills Eyes: No visual changes. ENT: No sore throat. Dental pain Cardiovascular: Denies chest pain. Respiratory: Denies shortness of breath. Gastrointestinal: No abdominal pain.  No nausea, no vomiting.  No diarrhea.  No constipation. Genitourinary: Negative for dysuria. Musculoskeletal: Negative for back pain. Skin: Negative for rash. Neurological: Negative for headaches, focal weakness or numbness. Psychiatric:Anxiety and depression Endocrine:Hypertension ____________________________________________   PHYSICAL EXAM:  VITAL SIGNS: ED Triage Vitals  Enc Vitals Group     BP 04/13/15 1705 126/77 mmHg     Pulse Rate 04/13/15 1705 89     Resp 04/13/15 1705 16     Temp 04/13/15 1705 98.4 F (36.9 C)     Temp Source 04/13/15 1705 Oral     SpO2 04/13/15 1705 100 %     Weight 04/13/15 1705 150 lb (68.04 kg)     Height 04/13/15 1705  (1.6 m)     Head Cir --      Peak Flow --      Pain Score 04/13/15 1705 0     Pain Loc --      Pain Edu? --      Excl. in GC? --     Constitutional: Alert and oriented. Well appearing and in no acute distress. Eyes: Conjunctivae are normal. PERRL. EOMI. Head: Atraumatic. Nose: No congestion/rhinnorhea. Mouth/Throat: Mucous membranes are moist.  Oropharynx non-erythematous. Neck: No stridor.  No cervical spine tenderness to palpation. Hematological/Lymphatic/Immunilogical: No cervical lymphadenopathy. Cardiovascular: Normal rate, regular rhythm. Grossly normal heart sounds.  Good peripheral  circulation. Respiratory: Normal respiratory effort.  No  retractions. Lungs CTAB. Gastrointestinal: Soft and nontender. No distention. No abdominal bruits. No CVA tenderness. Musculoskeletal: No lower extremity tenderness nor edema.  No joint effusions. Neurologic:  Normal speech and language. No gross focal neurologic deficits are appreciated. No gait instability. Skin:  Skin is warm, dry and intact. No rash noted. Psychiatric: Mood and affect are normal. Speech and behavior are normal.  ____________________________________________   LABS (all labs ordered are listed, but only abnormal results are displayed)  Labs Reviewed - No data to display ____________________________________________  EKG   ____________________________________________  RADIOLOGY   ____________________________________________   PROCEDURES  Procedure(s) performed: None  Critical Care performed: No  ____________________________________________   INITIAL IMPRESSION / ASSESSMENT AND PLAN / ED COURSE  Pertinent labs & imaging results that were available during my care of the patient were reviewed by me and considered in my medical decision making (see chart for details).  Dental pain secondary to devitalize teeth. Patient given discharge care instructions and a list of dental clinics for follow-up care. Patient given prescription for amoxicillin, ibuprofen, and tramadol. ____________________________________________   FINAL CLINICAL IMPRESSION(S) / ED DIAGNOSES  Final diagnoses:  Pain due to dental caries      Joni Reiningonald K Smith, PA-C 04/13/15 2348  Myrna Blazeravid Matthew Schaevitz, MD 04/14/15 [phone]

## 2015-04-13 NOTE — ED Notes (Signed)
Rt lower dental pain x 2 days 

## 2015-04-13 NOTE — Discharge Instructions (Signed)
Follow-up for a list of dental clinics provided. OPTIONS FOR DENTAL FOLLOW UP CARE  [place] Department of Health and Human Services - Local Safety Net Dental Clinics TripDoors.comhttp://www.ncdhhs.gov/dph/oralhealth/services/safetynetclinics.htm   [facility] [phone])  [facility] [place] [phone])  [facility] [phone]  [facility] [phone])  [facility] [phone]) This clinic caters to the indigent population and is on a lottery system. Location: [employer] [address], [address], [place] [facility] Hours: Wednesdays from 6pm - 9pm, patients seen by a lottery system. For dates, call or go to [email or web address] Services: Cleanings, fillings and simple extractions. Payment Options: DENTAL WORK IS FREE OF CHARGE. Bring proof of income or support. Best way to get seen: Arrive at 5:15 pm - this is a lottery, NOT first come/first serve, so arriving earlier will not increase your chances of being seen.     [facility] [phone] Select option 1 for emergencies   Location: [facility], [address] [facility], [address], [place] [facility] Hours: No walk-ins accepted - call the day before to schedule an appointment. Check in times are 9:30 am and 1:30 pm. Services: Simple extractions, temporary fillings, pulpectomy/pulp debridement, uncomplicated abscess drainage. Payment Options: PAYMENT IS DUE AT THE TIME OF SERVICE.  Fee is usually $100-200, additional surgical procedures (e.g. abscess drainage) may be extra. Cash, checks, Visa/MasterCard accepted.  Can file Medicaid if patient is covered for dental - patient should call case worker to check. No discount for [facility] patients. Best way to get seen: MUST call the day before and get onto the schedule. Can usually be seen the next 1-2 days. No walk-ins accepted.      [facility] [phone]   Location: [facility], [address], [place] [facility] Hours: M, W, Th, F 8am or 1:30pm, Tues 9a or 1:30 - first come/first served. Services: Simple extractions, temporary fillings, uncomplicated abscess drainage.  You do not need to be [place] resident. Payment Options: PAYMENT IS DUE AT THE TIME OF SERVICE. Dental insurance, otherwise sliding scale - bring proof of income or support. Depending on income and treatment needed, cost is usually $50-200. Best way to get seen: Arrive early as it is first come/first served.     [facility] [phone]   Location: [address] Clinic Hours: Mon-Thu 8a-5p Services: Most basic dental services including extractions and fillings. Payment Options: PAYMENT IS DUE AT THE TIME OF SERVICE. Sliding scale, up to 50% off - bring proof if income or support. Medicaid with dental option accepted. Best way to get seen: Call to schedule an appointment, can usually be seen within 2 weeks OR they will try to see walk-ins - show up at 8a or 2p (you may have to wait).     [facility] [phone] [place] RESIDENTS ONLY   Location: [facility], [address], [place], [place] [id number] Clinic Hours: By appointment only. Monday - Thursday 8am-5pm, Friday 8am-12pm Services: Cleanings, fillings, extractions. Payment Options: PAYMENT IS DUE AT THE TIME OF SERVICE. Cash, Visa or MasterCard. Sliding scale - $30 minimum per service. Best way to get seen: Come in to office, complete packet and make an appointment - need proof of income or support monies for each household member and proof of [place] residence. Usually takes about a month to get in.     [facility] °[phone] °  °Location: °[address] [place] °Clinic Hours: Walk-in Urgent Care  Dental Services are offered Monday-Friday mornings only. °The numbers of emergencies accepted daily is limited to the number of °providers available. °Maximum 15 - Mondays, Wednesdays & Thursdays °Maximum 10 - Tuesdays & Fridays °Services: °You do not need to be a [place] resident to be seen for a dental emergency. °Emergencies are defined as pain, swelling, abnormal bleeding, or dental trauma. Walkins will receive x-rays if needed. °NOTE: Dental cleaning is not an emergency. °Payment Options: °PAYMENT IS DUE AT THE TIME OF SERVICE. °Minimum co-pay is $40.00 for uninsured patients. °Minimum co-pay is $3.00 for Medicaid with dental coverage. °Dental Insurance is accepted and must be presented at time of visit. °Medicare does not cover dental. °Forms of payment: Cash, credit card, checks. °Best way to get seen: °If not previously registered with the clinic, walk-in dental registration begins at 7:15 am and is on a first come/first serve basis. °If previously registered with the clinic, call to make an appointment. °  °  °[facility] °[phone] °[place] RESIDENTS ONLY °  °Location: °[address], [place], [place] °Clinic Hours: °Mon-Thu 10a-2p °Services: Extractions only! °Payment Options: °FREE (donations accepted) - bring proof of income or support °Best way to get seen: °Call and schedule an appointment OR come at 8am on the 1st Monday of every month (except for holidays) when it is first come/first served. °  °  °[facility] °[phone] °  °Location: °[address], [place] °Clinic Hours: °Friday mornings °Services, Payment Options, Best way to get seen: °Call for info ° °

## 2015-04-15 ENCOUNTER — Telehealth (HOSPITAL_COMMUNITY): Payer: Self-pay | Admitting: Emergency Medicine

## 2015-04-15 NOTE — ED Notes (Signed)
Called pt and notified of recent lab results from visit 3/14 Pt ID'd properly... Reports feeling better and sx have subsided  Per Dr. Dayton ScrapeMurray,  Please let patient know that gonorrhea/chlamydia tests were negative. Recheck for persistent symptoms. LM Notes Recorded by Eustace MooreLaura W Murray, MD on 04/09/2015 at 2:42 PM Please let patient know that tests for candida (yeast), gardnerella (bacterial vaginosis), and trichomonas were negative. Recheck for persistent symptoms. LM  Adv pt if sx are not getting better to return  Pt verb understanding Education on safe sex given

## 2015-04-20 ENCOUNTER — Emergency Department
Admission: EM | Admit: 2015-04-20 | Discharge: 2015-04-20 | Disposition: A | Discharge status: Home / Self Care | Payer: Self-pay | Attending: Emergency Medicine | Admitting: Emergency Medicine

## 2015-04-20 DIAGNOSIS — Z79899 Other long term (current) drug therapy: Secondary | ICD-10-CM | POA: Insufficient documentation

## 2015-04-20 DIAGNOSIS — Z791 Long term (current) use of non-steroidal anti-inflammatories (NSAID): Secondary | ICD-10-CM | POA: Insufficient documentation

## 2015-04-20 DIAGNOSIS — Z792 Long term (current) use of antibiotics: Secondary | ICD-10-CM | POA: Insufficient documentation

## 2015-04-20 DIAGNOSIS — K0889 Other specified disorders of teeth and supporting structures: Secondary | ICD-10-CM | POA: Insufficient documentation

## 2015-04-20 DIAGNOSIS — K032 Erosion of teeth: Secondary | ICD-10-CM | POA: Insufficient documentation

## 2015-04-20 DIAGNOSIS — G8929 Other chronic pain: Secondary | ICD-10-CM | POA: Insufficient documentation

## 2015-04-20 DIAGNOSIS — K029 Dental caries, unspecified: Secondary | ICD-10-CM | POA: Insufficient documentation

## 2015-04-20 MED ORDER — LIDOCAINE VISCOUS 2 % MT SOLN: 5.0000 mL | OROMUCOSAL | Status: DC | PRN

## 2015-04-20 MED ORDER — LIDOCAINE VISCOUS 2 % MT SOLN
15.0000 mL | Freq: Once | OROMUCOSAL | Status: AC
  Administered 2015-04-20: 15 mL via OROMUCOSAL
  Filled 2015-04-20: qty 15

## 2015-04-20 NOTE — ED Provider Notes (Signed)
[facility] Emergency Department Provider Note  ____________________________________________  Time seen: Approximately 8:16 PM  I have reviewed the triage vital signs and the nursing notes.   HISTORY  Chief Complaint Dental Pain    HPI Erica [name] is a 35 y.o. female who presents emergency department for chronic dental pain. Patient states that she has known dental caries extending into the pulp. Patient was seen 1 week ago by a provider and was given viscous lidocaine for pain. Patient is requesting same. She states that she is waiting on an insurance card so that she can present to a dentist for further treatment and evaluation. She denies any fevers or chills, difficulty breathing or swallowing, nausea or vomiting.   Past Medical History  Diagnosis Date  . No pertinent past medical history   . Anxiety     as teen  . Abnormal Pap smear   . Depression     as teen  . VBAC, delivered, current hospitalization 05/19/2011  . Headache(784.0)   . GERD (gastroesophageal reflux disease)   . Anxiety     Patient Active Problem List   Diagnosis Date Noted  . URI (upper respiratory infection) 09/23/2010  . Right otitis media 09/23/2010  . Positive pregnancy test 09/23/2010    Past Surgical History  Procedure Laterality Date  . Cesarean section    . Colposcopy    . Wisdom tooth extraction    . Dilation and curettage of uterus  2010    Current Outpatient Rx  Name  Route  Sig  Dispense  Refill  . albuterol (PROVENTIL HFA;VENTOLIN HFA) 108 (90 BASE) MCG/ACT inhaler   Inhalation   Inhale 2 puffs into the lungs every 4 (four) hours as needed for wheezing or shortness of breath.   1 Inhaler   0   . amoxicillin (AMOXIL) 500 MG capsule   Oral   Take 1 capsule (500 mg total) by mouth 3 (three) times daily.   21 capsule   0   . amoxicillin (AMOXIL) 500 MG capsule   Oral   Take 1 capsule (500 mg total) by mouth 3 (three) times daily.   30 capsule    0   . azithromycin (ZITHROMAX) 250 MG tablet   Oral   Take 1 tablet (250 mg total) by mouth daily. Take first 2 tablets together, then 1 every day until finished.   6 tablet   0   . bisacodyl (DULCOLAX) 5 MG EC tablet   Oral   Take 1 tablet (5 mg total) by mouth 2 (two) times daily.   14 tablet   0   . cephALEXin (KEFLEX) 500 MG capsule   Oral   Take 1 capsule (500 mg total) by mouth 2 (two) times daily.   14 capsule   0   . cephALEXin (KEFLEX) 500 MG capsule   Oral   Take 1 capsule (500 mg total) by mouth 4 (four) times daily.   21 capsule   0   . ibuprofen (ADVIL,MOTRIN) 800 MG tablet   Oral   Take 1 tablet (800 mg total) by mouth every 8 (eight) hours as needed.   30 tablet   0   . ipratropium (ATROVENT) 0.06 % nasal spray   Each Nare   Place 2 sprays into both nostrils 4 (four) times daily.   15 mL   1   . ketorolac (TORADOL) 10 MG tablet   Oral   Take 1 tablet (10 mg total) by mouth  every 8 (eight) hours as needed.   20 tablet   0   . lidocaine (XYLOCAINE) 2 % solution   Mouth/Throat   Use as directed 5 mLs in the mouth or throat as needed for mouth pain.   100 mL   0   . metroNIDAZOLE (FLAGYL) 500 MG tablet   Oral   Take 1 tablet (500 mg total) by mouth 3 (three) times daily.   21 tablet   0   . naproxen (NAPROSYN) 500 MG tablet   Oral   Take 1 tablet (500 mg total) by mouth 2 (two) times daily with a meal.   30 tablet   0   . ondansetron (ZOFRAN) 4 MG tablet      Take 1-2 tabs by mouth every 8 hours as needed for nausea/vomiting   30 tablet   0   . traMADol (ULTRAM) 50 MG tablet   Oral   Take 1 tablet (50 mg total) by mouth every 6 (six) hours as needed for moderate pain.   12 tablet   0     Allergies Review of patient's allergies indicates no known allergies.  Family History  Problem Relation Age of Onset  . Hypertension Mother   . Seizures Mother   . Hypertension Maternal Grandmother   . Arthritis Maternal Grandmother   .  Cancer Maternal Grandmother     breast  . Stroke Maternal Grandfather   . Seizures Brother   . Mental illness Brother     paranoid schizophrenia  . Birth defects Daughter     hole in heart  . Seizures Maternal Aunt   . Seizures Brother   . Seizures Cousin   . Seizures Cousin   . Mental retardation Cousin     Social History Social History  Substance Use Topics  . Smoking status: Current Every Day Smoker -- 1.00 packs/day for 13 years    Types: Cigarettes  . Smokeless tobacco: Never Used  . Alcohol Use: Yes     Comment: rare     Review of Systems  Constitutional: No fever/chills ENT: No sore throat.Positive for dental pain. Cardiovascular: no chest pain. Respiratory: no cough. No SOB. Gastrointestinal: No abdominal pain.  No nausea, no vomiting.   Neurological: Negative for headaches, focal weakness or numbness. 10-point ROS otherwise negative.  ____________________________________________   PHYSICAL EXAM:  VITAL SIGNS: ED Triage Vitals  Enc Vitals Group     BP 04/20/15 1949 120/71 mmHg     Pulse Rate 04/20/15 1949 79     Resp 04/20/15 1949 14     Temp 04/20/15 1949 98.6 F (37 C)     Temp Source 04/20/15 1949 Oral     SpO2 04/20/15 1949 100 %     Weight 04/20/15 1949 150 lb (68.04 kg)     Height 04/20/15 1949 5\' 3"  (1.6 m)     Head Cir --      Peak Flow --      Pain Score 04/20/15 1950 10     Pain Loc --      Pain Edu? --      Excl. in GC? --      Constitutional: Alert and oriented. Well appearing and in no acute distress. Eyes: Conjunctivae are normal. PERRL. EOMI. Head: Atraumatic. ENT:      Mouth/Throat: Mucous membranes are moist. Multiple dental erosions and caries throughout mouth. No erythema or edema. Oropharynx is nonerythematous and nonedematous. Neck: No stridor.  Cardiovascular: Normal rate, regular rhythm. Normal  S1 and S2.  Good peripheral circulation. Respiratory: Normal respiratory effort without tachypnea or retractions. Lungs  CTAB. Neurologic:  Normal speech and language. No gross focal neurologic deficits are appreciated.  Skin:  Skin is warm, dry and intact. No rash noted. Psychiatric: Mood and affect are normal. Speech and behavior are normal. Patient exhibits appropriate insight and judgement.   ____________________________________________   LABS (all labs ordered are listed, but only abnormal results are displayed)  Labs Reviewed - No data to display ____________________________________________  EKG   ____________________________________________  RADIOLOGY   No results found.  ____________________________________________    PROCEDURES  Procedure(s) performed:       Medications  lidocaine (XYLOCAINE) 2 % viscous mouth solution 15 mL (not administered)     ____________________________________________   INITIAL IMPRESSION / ASSESSMENT AND PLAN / ED COURSE  Pertinent labs & imaging results that were available during my care of the patient were reviewed by me and considered in my medical decision making (see chart for details).  Patient's diagnosis is consistent with dental pain from dental erosions and caries. Patient is given viscous lidocaine in the emergency department.. Patient will be discharged home with prescriptions for viscous lidocaine for symptom control. Patient is to follow up with [facility] dental clinic if symptoms persist past this treatment course. Patient is given ED precautions to return to the ED for any worsening or new symptoms.     ____________________________________________  FINAL CLINICAL IMPRESSION(S) / ED DIAGNOSES  Final diagnoses:  Pain due to dental caries      NEW MEDICATIONS STARTED DURING THIS VISIT:  New Prescriptions   LIDOCAINE (XYLOCAINE) 2 % SOLUTION    Use as directed 5 mLs in the mouth or throat as needed for mouth pain.        This chart was dictated using voice recognition software/Dragon. Despite best efforts to proofread,  errors can occur which can change the meaning. Any change was purely unintentional.    Racheal Patches, PA-C 04/20/15 2024  Jennye Moccasin, MD 04/20/15 2025

## 2015-04-20 NOTE — Discharge Instructions (Signed)
Dental Caries °Dental caries (also called tooth decay) is the most common oral disease. It can occur at any age but is more common in children and young adults.  °HOW DENTAL CARIES DEVELOPS  °The process of decay begins when bacteria and foods (particularly sugars and starches) combine in your mouth to produce plaque. Plaque is a substance that sticks to the hard, outer surface of a tooth (enamel). The bacteria in plaque produce acids that attack enamel. These acids may also attack the root surface of a tooth (cementum) if it is exposed. Repeated attacks dissolve these surfaces and create holes in the tooth (cavities). If left untreated, the acids destroy the other layers of the tooth.  °RISK FACTORS °· Frequent sipping of sugary beverages.   °· Frequent snacking on sugary and starchy foods, especially those that easily get stuck in the teeth.   °· Poor oral hygiene.   °· Dry mouth.   °· Substance abuse such as methamphetamine abuse.   °· Broken or poor-fitting dental restorations.   °· Eating disorders.   °· Gastroesophageal reflux disease (GERD).   °· Certain radiation treatments to the head and neck. °SYMPTOMS °In the early stages of dental caries, symptoms are seldom present. Sometimes white, chalky areas may be seen on the enamel or other tooth layers. In later stages, symptoms may include: °· Pits and holes on the enamel. °· Toothache after sweet, hot, or cold foods or drinks are consumed. °· Pain around the tooth. °· Swelling around the tooth. °DIAGNOSIS  °Most of the time, dental caries is detected during a regular dental checkup. A diagnosis is made after a thorough medical and dental history is taken and the surfaces of your teeth are checked for signs of dental caries. Sometimes special instruments, such as lasers, are used to check for dental caries. Dental X-ray exams may be taken so that areas not visible to the eye (such as between the contact areas of the teeth) can be checked for cavities.    °TREATMENT  °If dental caries is in its early stages, it may be reversed with a fluoride treatment or an application of a remineralizing agent at the dental office. Thorough brushing and flossing at home is needed to aid these treatments. If it is in its later stages, treatment depends on the location and extent of tooth destruction:  °· If a small area of the tooth has been destroyed, the destroyed area will be removed and cavities will be filled with a material such as gold, silver amalgam, or composite resin.   °· If a large area of the tooth has been destroyed, the destroyed area will be removed and a cap (crown) will be fitted over the remaining tooth structure.   °· If the center part of the tooth (pulp) is affected, a procedure called a root canal will be needed before a filling or crown can be placed.   °· If most of the tooth has been destroyed, the tooth may need to be pulled (extracted). °HOME CARE INSTRUCTIONS °You can prevent, stop, or reverse dental caries at home by practicing good oral hygiene. Good oral hygiene includes: °· Thoroughly cleaning your teeth at least twice a day with a toothbrush and dental floss.   °· Using a fluoride toothpaste. A fluoride mouth rinse may also be used if recommended by your dentist or health care provider.   °· Restricting the amount of sugary and starchy foods and sugary liquids you consume.   °· Avoiding frequent snacking on these foods and sipping of these liquids.   °· Keeping regular visits with   a dentist for checkups and cleanings. °PREVENTION  °· Practice good oral hygiene. °· Consider a dental sealant. A dental sealant is a coating material that is applied by your dentist to the pits and grooves of teeth. The sealant prevents food from being trapped in them. It may protect the teeth for several years. °· Ask about fluoride supplements if you live in a community without fluorinated water or with water that has a low fluoride content. Use fluoride supplements  as directed by your dentist or health care provider. °· Allow fluoride varnish applications to teeth if directed by your dentist or health care provider. °  °This information is not intended to replace advice given to you by your health care provider. Make sure you discuss any questions you have with your health care provider. °  °Document Released: 10/03/2001 Document Revised: 02/01/2014 Document Reviewed: 01/14/2012 °Elsevier Interactive Patient Education ©2016 Elsevier Inc. °Dental Care and Dentist Visits °Dental care supports good overall health. Regular dental visits can also help you avoid dental pain, bleeding, infection, and other more serious health problems in the future. It is important to keep the mouth healthy because diseases in the teeth, gums, and other oral tissues can spread to other areas of the body. Some problems, such as diabetes, heart disease, and pre-term labor have been associated with poor oral health.  °See your dentist every 6 months. If you experience emergency problems such as a toothache or broken tooth, go to the dentist right away. If you see your dentist regularly, you may catch problems early. It is easier to be treated for problems in the early stages.  °WHAT TO EXPECT AT A DENTIST VISIT  °Your dentist will look for many common oral health problems and recommend proper treatment. At your regular dental visit, you can expect: °· Gentle cleaning of the teeth and gums. This includes scraping and polishing. This helps to remove the sticky substance around the teeth and gums (plaque). Plaque forms in the mouth shortly after eating. Over time, plaque hardens on the teeth as tartar. If tartar is not removed regularly, it can cause problems. Cleaning also helps remove stains. °· Periodic X-rays. These pictures of the teeth and supporting bone will help your dentist assess the health of your teeth. °· Periodic fluoride treatments. Fluoride is a natural mineral shown to help strengthen  teeth. Fluoride treatment involves applying a fluoride gel or varnish to the teeth. It is most commonly done in children. °· Examination of the mouth, tongue, jaws, teeth, and gums to look for any oral health problems, such as: °· Cavities (dental caries). This is decay on the tooth caused by plaque, sugar, and acid in the mouth. It is best to catch a cavity when it is small. °· Inflammation of the gums caused by plaque buildup (gingivitis). °· Problems with the mouth or malformed or misaligned teeth. °· Oral cancer or other diseases of the soft tissues or jaws.  °KEEP YOUR TEETH AND GUMS HEALTHY °For healthy teeth and gums, follow these general guidelines as well as your dentist's specific advice: °· Have your teeth professionally cleaned at the dentist every 6 months. °· Brush twice daily with a fluoride toothpaste. °· Floss your teeth daily.  °· Ask your dentist if you need fluoride supplements, treatments, or fluoride toothpaste. °· Eat a healthy diet. Reduce foods and drinks with added sugar. °· Avoid smoking. °TREATMENT FOR ORAL HEALTH PROBLEMS °If you have oral health problems, treatment varies depending on the conditions present in your teeth and   and gums.  Your caregiver will most likely recommend good oral hygiene at each visit.  For cavities, gingivitis, or other oral health disease, your caregiver will perform a procedure to treat the problem. This is typically done at a separate appointment. Sometimes your caregiver will refer you to another dental specialist for specific tooth problems or for surgery. SEEK IMMEDIATE DENTAL CARE IF:  You have pain, bleeding, or soreness in the gum, tooth, jaw, or mouth area.  A permanent tooth becomes loose or separated from the gum socket.  You experience a blow or injury to the mouth or jaw area.   This information is not intended to replace advice given to you by your health care provider. Make sure you discuss any questions you have with your health care  provider.   Document Released: 09/23/2010 Document Revised: 04/05/2011 Document Reviewed: 09/23/2010 Elsevier Interactive Patient Education [employer]2016 Elsevier Inc.

## 2015-04-20 NOTE — ED Notes (Signed)
Pt complains of right jaw pain for two weeks. Pt states was seen approx one week pta and prescribed lidocaine for pain, pt states wishes to receive lidocaine for pain. Pt in no acute distres in triage.

## 2015-07-26 ENCOUNTER — Encounter (HOSPITAL_COMMUNITY): Payer: Self-pay | Admitting: Emergency Medicine

## 2015-07-26 ENCOUNTER — Ambulatory Visit (HOSPITAL_COMMUNITY)
Admission: EM | Admit: 2015-07-26 | Discharge: 2015-07-26 | Disposition: A | Discharge status: Home / Self Care | Payer: Medicaid Other | Attending: Emergency Medicine | Admitting: Emergency Medicine

## 2015-07-26 DIAGNOSIS — K047 Periapical abscess without sinus: Secondary | ICD-10-CM

## 2015-07-26 DIAGNOSIS — Z9189 Other specified personal risk factors, not elsewhere classified: Secondary | ICD-10-CM

## 2015-07-26 DIAGNOSIS — G8929 Other chronic pain: Secondary | ICD-10-CM

## 2015-07-26 DIAGNOSIS — K089 Disorder of teeth and supporting structures, unspecified: Secondary | ICD-10-CM

## 2015-07-26 MED ORDER — AMOXICILLIN 500 MG PO CAPS: 1000.0000 mg | ORAL_CAPSULE | Freq: Two times a day (BID) | ORAL | Status: DC

## 2015-07-26 MED ORDER — HYDROCODONE-ACETAMINOPHEN 5-325 MG PO TABS
1.0000 | ORAL_TABLET | ORAL | Status: DC | PRN
Start: 2015-07-26 — End: 2015-11-12

## 2015-07-26 NOTE — ED Provider Notes (Signed)
[name]: [id number]     Arrival date & time 07/26/15  1202 History   First MD Initiated Contact with Patient 07/26/15 1231     Chief Complaint  Patient presents with  . Dental Pain   (Consider location/radiation/quality/duration/timing/severity/associated sxs/prior Treatment) HPI Comments: 35 year old female complaining of a toothache for 2 weeks or more. Tooth is located to the right lower jaw associated with gingival swelling and associated facial pain. Denies fevers or chills. This is a recurring problem for her. She visits emergency departments and urgent care for her dental care as well as primary care.   Past Medical History  Diagnosis Date  . No pertinent past medical history   . Anxiety     as teen  . Abnormal Pap smear   . Depression     as teen  . VBAC, delivered, current hospitalization 05/19/2011  . Headache(784.0)   . GERD (gastroesophageal reflux disease)   . Anxiety    Past Surgical History  Procedure Laterality Date  . Cesarean section    . Colposcopy    . Wisdom tooth extraction    . Dilation and curettage of uterus  2010   Family History  Problem Relation Age of Onset  . Hypertension Mother   . Seizures Mother   . Hypertension Maternal Grandmother   . Arthritis Maternal Grandmother   . Cancer Maternal Grandmother     breast  . Stroke Maternal Grandfather   . Seizures Brother   . Mental illness Brother     paranoid schizophrenia  . Birth defects Daughter     hole in heart  . Seizures Maternal Aunt   . Seizures Brother   . Seizures Cousin   . Seizures Cousin   . Mental retardation Cousin    Social History  Substance Use Topics  . Smoking status: Current Every Day Smoker -- 1.00 packs/day for 13 years    Types: Cigarettes  . Smokeless tobacco: Never Used  . Alcohol Use: Yes     Comment: rare   OB History    Gravida Para Term Preterm AB TAB SAB Ectopic Multiple Living   7 6 4 2 1  0 1 0 0 6     Review of Systems  Constitutional: Negative.    HENT: Positive for dental problem. Negative for ear pain.   Respiratory: Negative.   Neurological: Negative.   All other systems reviewed and are negative.   Allergies  Review of patient's allergies indicates no known allergies.  Home Medications   Prior to Admission medications   Medication Sig Start Date End Date Taking? Authorizing Provider  albuterol (PROVENTIL HFA;VENTOLIN HFA) 108 (90 BASE) MCG/ACT inhaler Inhale 2 puffs into the lungs every 4 (four) hours as needed for wheezing or shortness of breath. 01/06/15   Tharon AquasFrank C Patrick, PA  amoxicillin (AMOXIL) 500 MG capsule Take 2 capsules (1,000 mg total) by mouth 2 (two) times daily. 07/26/15   Hayden Rasmussenavid Ryley Teater, NP  azithromycin (ZITHROMAX) 250 MG tablet Take 1 tablet (250 mg total) by mouth daily. Take first 2 tablets together, then 1 every day until finished. 01/06/15   Tharon AquasFrank C Patrick, PA  bisacodyl (DULCOLAX) 5 MG EC tablet Take 1 tablet (5 mg total) by mouth 2 (two) times daily. 04/08/15   Tharon AquasFrank C Patrick, PA  cephALEXin (KEFLEX) 500 MG capsule Take 1 capsule (500 mg total) by mouth 2 (two) times daily. 03/07/15   Loleta Roseory Forbach, MD  cephALEXin (KEFLEX) 500 MG capsule Take 1 capsule (500 mg total) by  mouth 4 (four) times daily. 04/08/15   Tharon Aquas, PA  HYDROcodone-acetaminophen (NORCO/VICODIN) 5-325 MG tablet Take 1 tablet by mouth every 4 (four) hours as needed. 07/26/15   Hayden Rasmussen, NP  ibuprofen (ADVIL,MOTRIN) 800 MG tablet Take 1 tablet (800 mg total) by mouth every 8 (eight) hours as needed. 04/13/15   Joni Reining, PA-C  ipratropium (ATROVENT) 0.06 % nasal spray Place 2 sprays into both nostrils 4 (four) times daily. 02/24/14   Rodolph Bong, MD  ketorolac (TORADOL) 10 MG tablet Take 1 tablet (10 mg total) by mouth every 8 (eight) hours as needed. 12/24/14   [name], MD  lidocaine (XYLOCAINE) 2 % solution Use as directed 5 mLs in the mouth or throat as needed for mouth pain. 04/20/15   Delorise Royals Cuthriell, PA-C  metroNIDAZOLE  (FLAGYL) 500 MG tablet Take 1 tablet (500 mg total) by mouth 3 (three) times daily. 04/08/15   Tharon Aquas, PA  naproxen (NAPROSYN) 500 MG tablet Take 1 tablet (500 mg total) by mouth 2 (two) times daily with a meal. 03/22/15   Barbaraann Barthel, MD  ondansetron [facility]) 4 MG tablet Take 1-2 tabs by mouth every 8 hours as needed for nausea/vomiting 03/07/15   Loleta Rose, MD  traMADol (ULTRAM) 50 MG tablet Take 1 tablet (50 mg total) by mouth every 6 (six) hours as needed for moderate pain. 04/13/15   Joni Reining, PA-C   Meds Ordered and Administered this Visit  Medications - No data to display  BP 126/71 mmHg  Pulse 75  Temp(Src) 98.7 F (37.1 C) (Oral)  Resp 18  Ht  (1.6 m)  Wt 150 lb (68.04 kg)  BMI 26.58 kg/m2  SpO2 98%  LMP 07/19/2015 No data found.   Physical Exam  Constitutional: She is oriented to person, place, and time. She appears well-developed and well-nourished. No distress.  HENT:  Mouth/Throat: Oropharynx is clear and moist.  Lower right tooth #28 with severe decay associated with dental tenderness and gingival swelling. Possible abscess formation between the buccal mucosa and gingiva.  Eyes: Conjunctivae and EOM are normal.  Neck: Normal range of motion. Neck supple.  Cardiovascular: Normal rate.   Pulmonary/Chest: Effort normal.  Neurological: She is alert and oriented to person, place, and time.  Skin: Skin is warm and dry.  Nursing note and vitals reviewed.   ED Course  Procedures (including critical care time)  Labs Review Labs Reviewed - No data to display  Imaging Review No results found.   Visual Acuity Review  Right Eye Distance:   Left Eye Distance:   Bilateral Distance:    Right Eye Near:   Left Eye Near:    Bilateral Near:         MDM   1. Chronic dental pain   2. Dental abscess   3. Poor dental hygiene    Meds ordered this encounter  Medications  . amoxicillin (AMOXIL) 500 MG capsule    Sig: Take 2 capsules (1,000  mg total) by mouth 2 (two) times daily.    Dispense:  40 capsule    Refill:  0    Order Specific Question:  Supervising Provider    Answer:  Charm Rings [id number]  . HYDROcodone-acetaminophen (NORCO/VICODIN) 5-325 MG tablet    Sig: Take 1 tablet by mouth every 4 (four) hours as needed.    Dispense:  12 tablet    Refill:  0    Order Specific Question:  Supervising Provider    Answer:  Charm RingsHONIG, ERIN J [[id number]]   Must obtain dentist is sent is possible. Also strongly recommended obtain a PCP is not as possible. It was suggested the [facility] to check on obtaining an appointment for reduced rises or free care. On a previous visit to the emergency department she was given the same instructions a few months ago but apparently did not make an effort to obtain this resource.   Hayden Rasmussenavid Othel Hoogendoorn, NP 07/26/15 1256  Hayden Rasmussenavid Maisen Schmit, NP 07/26/15 1258

## 2015-07-26 NOTE — ED Notes (Signed)
C/o right lower dental pain onset Thursday associated w/chills Pain is 8/10 A&O x4... No acute distress.

## 2015-07-26 NOTE — Discharge Instructions (Signed)
Dental Abscess °A dental abscess is a collection of pus in or around a tooth. °CAUSES °This condition is caused by a bacterial infection around the root of the tooth that involves the inner part of the tooth (pulp). It may result from: °· Severe tooth decay. °· Trauma to the tooth that allows bacteria to enter into the pulp, such as a broken or chipped tooth. °· Severe gum disease around a tooth. °SYMPTOMS °Symptoms of this condition include: °· Severe pain in and around the infected tooth. °· Swelling and redness around the infected tooth, in the mouth, or in the face. °· Tenderness. °· Pus drainage. °· Bad breath. °· Bitter taste in the mouth. °· Difficulty swallowing. °· Difficulty opening the mouth. °· Nausea. °· Vomiting. °· Chills. °· Swollen neck glands. °· Fever. °DIAGNOSIS °This condition is diagnosed with examination of the infected tooth. During the exam, your dentist may tap on the infected tooth. Your dentist will also ask about your medical and dental history and may order X-rays. °TREATMENT °This condition is treated by eliminating the infection. This may be done with: °· Antibiotic medicine. °· A root canal. This may be performed to save the tooth. °· Pulling (extracting) the tooth. This may also involve draining the abscess. This is done if the tooth cannot be saved. °HOME CARE INSTRUCTIONS °· Take medicines only as directed by your dentist. °· If you were prescribed antibiotic medicine, finish all of it even if you start to feel better. °· Rinse your mouth (gargle) often with salt water to relieve pain or swelling. °· Do not drive or operate heavy machinery while taking pain medicine. °· Do not apply heat to the outside of your mouth. °· Keep all follow-up visits as directed by your dentist. This is important. °SEEK MEDICAL CARE IF: °· Your pain is worse and is not helped by medicine. °SEEK IMMEDIATE MEDICAL CARE IF: °· You have a fever or chills. °· Your symptoms suddenly get worse. °· You have a  very bad headache. °· You have problems breathing or swallowing. °· You have trouble opening your mouth. °· You have swelling in your neck or around your eye. °  °This information is not intended to replace advice given to you by your health care provider. Make sure you discuss any questions you have with your health care provider. °  °Document Released: 01/11/2005 Document Revised: 05/28/2014 Document Reviewed: 01/08/2014 °Elsevier Interactive Patient Education ©2016 Elsevier Inc. ° °Dental Pain °Dental pain may be caused by many things, including: °· Tooth decay (cavities or caries). Cavities expose the nerve of your tooth to air and hot or cold temperatures. This can cause pain or discomfort. °· Abscess or infection. A dental abscess is a collection of infected pus from a bacterial infection in the inner part of the tooth (pulp). It usually occurs at the end of the tooth's root. °· Injury. °· An unknown reason (idiopathic). °Your pain may be mild or severe. It may only occur when: °· You are chewing. °· You are exposed to hot or cold temperature. °· You are eating or drinking sugary foods or beverages, such as soda or candy. °Your pain may also be constant. °HOME CARE INSTRUCTIONS °Watch your dental pain for any changes. The following actions may help to lessen any discomfort that you are feeling: °· Take medicines only as directed by your dentist. °· If you were prescribed an antibiotic medicine, finish all of it even if you start to feel better. °· Keep all   follow-up visits as directed by your dentist. This is important.  Do not apply heat to the outside of your face.  Rinse your mouth or gargle with salt water if directed by your dentist. This helps with pain and swelling.  You can make salt water by adding  tsp of salt to 1 cup of warm water.  Apply ice to the painful area of your face:  Put ice in a plastic bag.  Place a towel between your skin and the bag.  Leave the ice on for 20 minutes,  2-3 times per day.  Avoid foods or drinks that cause you pain, such as:  Very hot or very cold foods or drinks.  Sweet or sugary foods or drinks. SEEK MEDICAL CARE IF:  Your pain is not controlled with medicines.  Your symptoms are worse.  You have new symptoms. SEEK IMMEDIATE MEDICAL CARE IF:  You are unable to open your mouth.  You are having trouble breathing or swallowing.  You have a fever.  Your face, neck, or jaw is swollen.   This information is not intended to replace advice given to you by your health care provider. Make sure you discuss any questions you have with your health care provider.   Document Released: 01/11/2005 Document Revised: 05/28/2014 Document Reviewed: 01/07/2014 Elsevier Interactive Patient Education 2016 Elsevier Inc.  Pain Medicine Instructions HOW CAN PAIN MEDICINE AFFECT ME? You were prescribed pain medicine. This medicine may:  Make you tired or sleepy.  Affect how well you can:  Drive  Do certain activities. Pain medicine may not make all of your pain go away. You should be comfortable enough to:  Move.  Breathe.  Take care of yourself. HOW OFTEN SHOULD I TAKE PAIN MEDICINE AND HOW MUCH SHOULD I TAKE?  Take pain medicine only as told by your doctor and only as needed for pain.  You do not need to take pain medicine if you are not having pain, unless your doctor tells you to do that.  You can take less than the prescribed dose if you find that less medicine helps your pain. WHAT SHOULD I AVOID WHILE I AM TAKING PAIN MEDICINE? Follow these instructions after you start taking pain medicine, while you are taking the medicine, and for 8 hours after you stop taking the medicine:  Do not drive.  Do not use machinery.  Do not use power tools.  Do not sign legal documents.  Do not drink alcohol.  Do not take sleeping pills.  Do not take care of children by yourself.  Do not do any activities that involve climbing or  being in high places.  Do not go into any body of water unless there is an adult nearby who can watch and help you. This includes:  Lakes.  Rivers.  Oceans.  Spas.  Swimming pools. HOW CAN I KEEP OTHERS SAFE WHILE I AM TAKING PAIN MEDICINE?  Store your pain medicine as told by your doctor. Make sure that you keep it where children and pets cannot reach it.  Do not share your pain medicine with anyone.  Do not save any leftover pills. If you have any leftover pain medicine, get rid of it or destroy it as told by your doctor. WHAT ELSE DO I NEED TO KNOW ABOUT TAKING PAIN MEDICINE?  Use a poop (stool) softener if you have trouble pooping (constipation) because of your pain medicine. Eating more fruits and vegetables also helps with constipation.  Write down the times  when you take your pain medicine. Look at the times before you take your next dose of medicine.  If your pain is very bad, do not take more pills than told by your doctor. Call your doctor for help.  Your pain medicine might have acetaminophen in it. Do not take any other acetaminophen while you are taking this medicine. An overdose of acetaminophen can do very bad damage to your liver. If you are taking any medicines in addition to your pain medicine, check the active ingredients on those medicines to see if acetaminophen is listed. WHEN SHOULD I CALL MY DOCTOR?  Your medicine is not helping the pain.  You do either of these soon after you take the medicine:  Throw up (vomit).  Have watery poop (diarrhea).  You have new pain in areas that did not hurt before.  You have an allergic reaction to your medicine. This may include:  Feeling itchy.  Swelling.  Feeling dizzy.  Getting a new rash. WHEN SHOULD I CALL 911 OR GO TO THE EMERGENCY ROOM?  You feel dizzy or you faint.  You feel very confused.  You throw up again and again.  Your skin or lips turn pale or bluish in color.  You are:  Short of  breath.  Breathing much more slowly than usual.  You have a very bad allergic reaction to your medicine. This includes:  Developing a swollen tongue.  Having trouble breathing.   This information is not intended to replace advice given to you by your health care provider. Make sure you discuss any questions you have with your health care provider.   Document Released: 06/30/2007 Document Revised: 05/28/2014 Document Reviewed: 11/15/2013 Elsevier Interactive Patient Education [employer]

## 2015-09-14 ENCOUNTER — Emergency Department: Payer: Medicaid Other

## 2015-09-14 DIAGNOSIS — Y999 Unspecified external cause status: Secondary | ICD-10-CM | POA: Insufficient documentation

## 2015-09-14 DIAGNOSIS — Z7951 Long term (current) use of inhaled steroids: Secondary | ICD-10-CM | POA: Insufficient documentation

## 2015-09-14 DIAGNOSIS — Z79899 Other long term (current) drug therapy: Secondary | ICD-10-CM | POA: Insufficient documentation

## 2015-09-14 DIAGNOSIS — Y929 Unspecified place or not applicable: Secondary | ICD-10-CM | POA: Insufficient documentation

## 2015-09-14 DIAGNOSIS — Y9301 Activity, walking, marching and hiking: Secondary | ICD-10-CM | POA: Insufficient documentation

## 2015-09-14 DIAGNOSIS — X501XXA Overexertion from prolonged static or awkward postures, initial encounter: Secondary | ICD-10-CM | POA: Insufficient documentation

## 2015-09-14 DIAGNOSIS — F17210 Nicotine dependence, cigarettes, uncomplicated: Secondary | ICD-10-CM | POA: Insufficient documentation

## 2015-09-14 DIAGNOSIS — S93402A Sprain of unspecified ligament of left ankle, initial encounter: Secondary | ICD-10-CM | POA: Insufficient documentation

## 2015-09-14 NOTE — ED Triage Notes (Signed)
Reports missed bottom step and left foot twisted.  Patient reports heard a "crack".

## 2015-09-15 ENCOUNTER — Emergency Department
Admission: EM | Admit: 2015-09-15 | Discharge: 2015-09-15 | Disposition: A | Discharge status: Home / Self Care | Payer: Medicaid Other | Attending: Emergency Medicine | Admitting: Emergency Medicine

## 2015-09-15 DIAGNOSIS — S93402A Sprain of unspecified ligament of left ankle, initial encounter: Secondary | ICD-10-CM

## 2015-09-15 DIAGNOSIS — T1490XA Injury, unspecified, initial encounter: Secondary | ICD-10-CM

## 2015-09-15 MED ORDER — IBUPROFEN 800 MG PO TABS
800.0000 mg | ORAL_TABLET | Freq: Once | ORAL | Status: AC
  Administered 2015-09-15: 800 mg via ORAL

## 2015-09-15 MED ORDER — IBUPROFEN 800 MG PO TABS
ORAL_TABLET | ORAL | Status: AC
  Filled 2015-09-15: qty 1

## 2015-09-15 NOTE — ED Notes (Signed)
Reviewed d/c instructions, follow-up care, splint care, use of OTC pain meds, use of ice/elevation, and safe use of crutches with pt. Pt verbalized understanding.

## 2015-09-15 NOTE — ED Notes (Signed)
Pt reports she was walkning down steps at approx 21:30 and turned lef  ankle, fell to ground and heard and crunching noise.   Pt c/o pain/numbness/tingling to left ankle. Pt reports decreased sensation to left foot on palpation. Pt reports she is unable to move foot, however can move toes.   Pt foot is normal color/temperature. Pt has edema to left ankle.

## 2015-09-15 NOTE — ED Notes (Signed)
Pt taken back to treatment room 10 via wheelchair; provided pt with 2 pillows to elevate leg, warm blankets, sprite for her and 2 children, graham crackers for children, coffee for husband and a remote control for the television; pt appreciative; side rail up with callbell in reach; verbal report given to pt's nurse, Eileen StanfordJenna, RN

## 2015-09-15 NOTE — ED Provider Notes (Signed)
[facility] Emergency Department Provider Note   ____________________________________________   First MD Initiated Contact with Patient 09/15/15 [phone]     (approximate)  I have reviewed the triage vital signs and the nursing notes.   HISTORY  Chief Complaint Ankle Pain    HPI Erica [name] is a 35 y.o. female with a history of depression who is presenting to the emergency department after a left ankle injury. She said that she was coming down the stairs when she rolled her left ankle. She is fairly certain that she everted the ankle. She says that she thought that she was on the way down the staircase but in fact was at the second to last stair and then misjudged her landing. She did not fall and hit her head. She has had swelling especially to the lateral ankle and has difficulty dorsiflexing the foot.   Past Medical History:  Diagnosis Date  . Abnormal Pap smear   . Anxiety    as teen  . Anxiety   . Depression    as teen  . GERD (gastroesophageal reflux disease)   . Headache(784.0)   . No pertinent past medical history   . VBAC, delivered, current hospitalization 05/19/2011    Patient Active Problem List   Diagnosis Date Noted  . URI (upper respiratory infection) 09/23/2010  . Right otitis media 09/23/2010  . Positive pregnancy test 09/23/2010    Past Surgical History:  Procedure Laterality Date  . CESAREAN SECTION    . COLPOSCOPY    . DILATION AND CURETTAGE OF UTERUS  2010  . WISDOM TOOTH EXTRACTION      Prior to Admission medications   Medication Sig Start Date End Date Taking? Authorizing Provider  albuterol (PROVENTIL HFA;VENTOLIN HFA) 108 (90 BASE) MCG/ACT inhaler Inhale 2 puffs into the lungs every 4 (four) hours as needed for wheezing or shortness of breath. 01/06/15   Tharon Aquas, PA  amoxicillin (AMOXIL) 500 MG capsule Take 2 capsules (1,000 mg total) by mouth 2 (two) times daily. 07/26/15   Hayden Rasmussen, NP  azithromycin  (ZITHROMAX) 250 MG tablet Take 1 tablet (250 mg total) by mouth daily. Take first 2 tablets together, then 1 every day until finished. 01/06/15   Tharon Aquas, PA  bisacodyl (DULCOLAX) 5 MG EC tablet Take 1 tablet (5 mg total) by mouth 2 (two) times daily. 04/08/15   Tharon Aquas, PA  cephALEXin (KEFLEX) 500 MG capsule Take 1 capsule (500 mg total) by mouth 2 (two) times daily. 03/07/15   Loleta Rose, MD  cephALEXin (KEFLEX) 500 MG capsule Take 1 capsule (500 mg total) by mouth 4 (four) times daily. 04/08/15   Tharon Aquas, PA  HYDROcodone-acetaminophen (NORCO/VICODIN) 5-325 MG tablet Take 1 tablet by mouth every 4 (four) hours as needed. 07/26/15   Hayden Rasmussen, NP  ibuprofen (ADVIL,MOTRIN) 800 MG tablet Take 1 tablet (800 mg total) by mouth every 8 (eight) hours as needed. 04/13/15   Joni Reining, PA-C  ipratropium (ATROVENT) 0.06 % nasal spray Place 2 sprays into both nostrils 4 (four) times daily. 02/24/14   Rodolph Bong, MD  ketorolac (TORADOL) 10 MG tablet Take 1 tablet (10 mg total) by mouth every 8 (eight) hours as needed. 12/24/14   [name], MD  lidocaine (XYLOCAINE) 2 % solution Use as directed 5 mLs in the mouth or throat as needed for mouth pain. 04/20/15   Delorise Royals Cuthriell, PA-C  metroNIDAZOLE (FLAGYL) 500 MG  tablet Take 1 tablet (500 mg total) by mouth 3 (three) times daily. 04/08/15   Tharon AquasFrank C Patrick, PA  naproxen (NAPROSYN) 500 MG tablet Take 1 tablet (500 mg total) by mouth 2 (two) times daily with a meal. 03/22/15   Barbaraann BarthelJames O Breen, MD  ondansetron [facility](ZOFRAN) 4 MG tablet Take 1-2 tabs by mouth every 8 hours as needed for nausea/vomiting 03/07/15   Loleta Roseory Forbach, MD  traMADol (ULTRAM) 50 MG tablet Take 1 tablet (50 mg total) by mouth every 6 (six) hours as needed for moderate pain. 04/13/15   Joni Reiningonald K Smith, PA-C    Allergies Review of patient's allergies indicates no known allergies.  Family History  Problem Relation Age of Onset  . Hypertension Mother   . Seizures Mother    . Hypertension Maternal Grandmother   . Arthritis Maternal Grandmother   . Cancer Maternal Grandmother     breast  . Stroke Maternal Grandfather   . Seizures Brother   . Mental illness Brother     paranoid schizophrenia  . Birth defects Daughter     hole in heart  . Seizures Maternal Aunt   . Seizures Brother   . Seizures Cousin   . Seizures Cousin   . Mental retardation Cousin     Social History Social History  Substance Use Topics  . Smoking status: Current Every Day Smoker    Packs/day: 1.00    Years: 13.00    Types: Cigarettes  . Smokeless tobacco: Never Used  . Alcohol use Yes     Comment: rare    Review of Systems Constitutional: No fever/chills Eyes: No visual changes. ENT: No sore throat. Cardiovascular: Denies chest pain. Respiratory: Denies shortness of breath. Gastrointestinal: No abdominal pain.  No nausea, no vomiting.  No diarrhea.  No constipation. Genitourinary: Negative for dysuria. Musculoskeletal: Negative for back pain. Skin: Negative for rash. Neurological: Negative for headaches, focal weakness or numbness.  10-point ROS otherwise negative.  ____________________________________________   PHYSICAL EXAM:  VITAL SIGNS: ED Triage Vitals  Enc Vitals Group     BP 09/14/15 2329 123/83     Pulse Rate 09/14/15 2329 85     Resp 09/14/15 2329 16     Temp 09/14/15 2329 98 F (36.7 C)     Temp Source 09/14/15 2329 Oral     SpO2 09/14/15 2329 97 %     Weight 09/14/15 2309 164 lb (74.4 kg)     Height 09/14/15 2309 5\' 3"  (1.6 m)     Head Circumference --      Peak Flow --      Pain Score 09/14/15 2309 9     Pain Loc --      Pain Edu? --      Excl. in GC? --     Constitutional: Alert and oriented. Well appearing and in no acute distress. Eyes: Conjunctivae are normal. PERRL. EOMI. Head: Atraumatic. Nose: No congestion/rhinnorhea. Mouth/Throat: Mucous membranes are moist.   Neck: No stridor.   Cardiovascular: Normal rate, regular  rhythm. Grossly normal heart sounds.   Respiratory: Normal respiratory effort.  No retractions. Lungs CTAB. Gastrointestinal: Soft and nontender. No distention.  Musculoskeletal: Left ankle with lateral malleoli or swelling and tenderness. Distal to the injury the patient has an intact dorsalis pedis pulse as well as sensation to light touch. She has full range of motion of the toes and is able to dorsiflex but range of motion is limited. The patient said that it is painful to dorsiflex.  She is able to dorsiflex about 10-15.  No medial malleolar tenderness to palpation. Patient is holding the foot and plantar flexion. Neurologic:  Normal speech and language. No gross focal neurologic deficits are appreciated. No gait instability. Skin:  Skin is warm, dry and intact. No rash noted. Psychiatric: Mood and affect are normal. Speech and behavior are normal.  ____________________________________________   LABS (all labs ordered are listed, but only abnormal results are displayed)  Labs Reviewed - No data to display ____________________________________________  EKG   ____________________________________________  RADIOLOGY  DG Ankle Complete Left (Accession [id number]) (Order [id number])  Imaging  Date: 09/14/2015 Department: [facility] EMERGENCY DEPARTMENT Released By: Eli HoseEdwin R Coltrane, RT Authorizing: Rebecka ApleyAllison P Webster, MD  PACS Images   Show images for DG Ankle Complete Left  Study Result   CLINICAL DATA:  Left ankle pain and swelling after injury, missed a step going down stairs today.  EXAM: LEFT ANKLE COMPLETE - 3+ VIEW  COMPARISON:  Foot and ankle radiographs 10/31/2008  FINDINGS: Anterior and lateral soft tissue edema. There is no evidence of fracture, dislocation, or joint effusion. There is no evidence of arthropathy or other focal bone abnormality. The ankle mortise is preserved. Tiny plantar calcaneal spur.  IMPRESSION: Anterior and lateral  soft tissue edema. No acute fracture or subluxation.   Electronically Signed   By: Rubye OaksMelanie  Ehinger M.D.   On: 09/14/2015 23:32     ____________________________________________   PROCEDURES  Procedure(s) performed:   Procedures  Critical Care performed:   ____________________________________________   INITIAL IMPRESSION / ASSESSMENT AND PLAN / ED COURSE  Pertinent labs & imaging results that were available during my care of the patient were reviewed by me and considered in my medical decision making (see chart for details).  ----------------------------------------- 2:25 AM on 09/15/2015 -----------------------------------------  Patient splinted in a short leg posterior splint to the left lower extremity. Patient is neurovascularly intact with sensation intact to light touch as well as ability to move her toes. The patient to the splint does not feel too tight. She was given crutches. She'll be given orthopedic follow-up. She is understanding of plan and willing to comply with outpatient follow-up and we also discussed pain control as well as [place]  Clinical Course     ____________________________________________   FINAL CLINICAL IMPRESSION(S) / ED DIAGNOSES  Left ankle sprain.    NEW MEDICATIONS STARTED DURING THIS VISIT:  New Prescriptions   No medications on file     Note:  This document was prepared using Dragon voice recognition software and may include unintentional dictation errors.    Myrna Blazeravid Matthew Schaevitz, MD 09/15/15 [id number]

## 2015-10-22 ENCOUNTER — Emergency Department
Admission: EM | Admit: 2015-10-22 | Discharge: 2015-10-22 | Disposition: A | Discharge status: Home / Self Care | Payer: Medicaid Other | Attending: Student in an Organized Health Care Education/Training Program | Admitting: Student in an Organized Health Care Education/Training Program

## 2015-10-22 ENCOUNTER — Emergency Department: Payer: Medicaid Other

## 2015-10-22 DIAGNOSIS — F17210 Nicotine dependence, cigarettes, uncomplicated: Secondary | ICD-10-CM | POA: Insufficient documentation

## 2015-10-22 DIAGNOSIS — Z79899 Other long term (current) drug therapy: Secondary | ICD-10-CM | POA: Insufficient documentation

## 2015-10-22 DIAGNOSIS — Z5321 Procedure and treatment not carried out due to patient leaving prior to being seen by health care provider: Secondary | ICD-10-CM | POA: Insufficient documentation

## 2015-10-22 DIAGNOSIS — J705 Respiratory conditions due to smoke inhalation: Secondary | ICD-10-CM | POA: Insufficient documentation
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
## 2015-10-22 NOTE — ED Notes (Signed)
Pt ambulatory to xray with steady gait noted.  

## 2015-10-22 NOTE — ED Notes (Signed)
Pt very irate at desk, cursing. Pt informed x-ray has been read by radiologist but not Pod D provider yet. Pt stating she is leaving. [place].
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
## 2015-11-12 ENCOUNTER — Encounter (HOSPITAL_COMMUNITY): Payer: Self-pay | Admitting: Family Medicine

## 2015-11-12 ENCOUNTER — Ambulatory Visit (HOSPITAL_COMMUNITY)
Admission: EM | Admit: 2015-11-12 | Discharge: 2015-11-12 | Disposition: A | Discharge status: Home / Self Care | Payer: Medicaid Other | Attending: Family Medicine | Admitting: Family Medicine

## 2015-11-12 DIAGNOSIS — R05 Cough: Secondary | ICD-10-CM

## 2015-11-12 DIAGNOSIS — J209 Acute bronchitis, unspecified: Secondary | ICD-10-CM

## 2015-11-12 DIAGNOSIS — R059 Cough, unspecified: Secondary | ICD-10-CM

## 2015-11-12 MED ORDER — DOXYCYCLINE HYCLATE 100 MG PO CAPS: 100.0000 mg | ORAL_CAPSULE | Freq: Two times a day (BID) | ORAL | 0 refills | Status: DC

## 2015-11-12 NOTE — ED Provider Notes (Signed)
[facility]    [name]: [id number] Arrival date & time: 11/12/15  1553     History   Chief Complaint Chief Complaint  Patient presents with  . Cough    HPI Erica [name] is a 35 y.o. female.   The history is provided by the patient.  Cough  Cough characteristics:  Productive Sputum characteristics:  Yellow Severity:  Moderate Onset quality:  Gradual Duration:  2 weeks Progression:  Unchanged Chronicity:  New Smoker: yes   Relieved by:  None tried Worsened by:  Nothing Ineffective treatments:  None tried Associated symptoms: rhinorrhea   Associated symptoms: no fever, no shortness of breath and no wheezing     Past Medical History:  Diagnosis Date  . Abnormal Pap smear   . Anxiety    as teen  . Anxiety   . Depression    as teen  . GERD (gastroesophageal reflux disease)   . Headache(784.0)   . No pertinent past medical history   . VBAC, delivered, current hospitalization 05/19/2011    Patient Active Problem List   Diagnosis Date Noted  . URI (upper respiratory infection) 09/23/2010  . Right otitis media 09/23/2010  . Positive pregnancy test 09/23/2010    Past Surgical History:  Procedure Laterality Date  . CESAREAN SECTION    . COLPOSCOPY    . DILATION AND CURETTAGE OF UTERUS  2010  . WISDOM TOOTH EXTRACTION      OB History    Gravida Para Term Preterm AB Living   7 6 4 2 1 6    SAB TAB Ectopic Multiple Live Births   1 0 0 0 6       Home Medications    Prior to Admission medications   Not on File    Family History Family History  Problem Relation Age of Onset  . Hypertension Mother   . Seizures Mother   . Hypertension Maternal Grandmother   . Arthritis Maternal Grandmother   . Cancer Maternal Grandmother     breast  . Stroke Maternal Grandfather   . Seizures Brother   . Mental illness Brother     paranoid schizophrenia  . Birth defects Daughter     hole in heart  . Seizures Maternal Aunt   . Seizures Brother   .  Seizures Cousin   . Seizures Cousin   . Mental retardation Cousin     Social History Social History  Substance Use Topics  . Smoking status: Current Every Day Smoker    Packs/day: 1.00    Years: 13.00    Types: Cigarettes  . Smokeless tobacco: Never Used  . Alcohol use Yes     Comment: rare     Allergies   Review of patient's allergies indicates no known allergies.   Review of Systems Review of Systems  Constitutional: Negative.  Negative for fever.  HENT: Positive for rhinorrhea.   Respiratory: Positive for cough. Negative for shortness of breath and wheezing.   Cardiovascular: Negative.   Gastrointestinal: Negative.   Genitourinary: Negative.   All other systems reviewed and are negative.    Physical Exam Triage Vital Signs ED Triage Vitals [11/12/15 1636]  Enc Vitals Group     BP 110/74     Pulse Rate 74     Resp 18     Temp 98.1 F (36.7 C)     Temp src      SpO2 98 %     Weight      Height  Head Circumference      Peak Flow      Pain Score      Pain Loc      Pain Edu?      Excl. in GC?    No data found.   Updated Vital Signs BP 110/74   Pulse 74   Temp 98.1 F (36.7 C)   Resp 18   LMP 10/08/2015 (Exact Date)   SpO2 98%   Visual Acuity Right Eye Distance:   Left Eye Distance:   Bilateral Distance:    Right Eye Near:   Left Eye Near:    Bilateral Near:     Physical Exam  Constitutional: She appears well-developed and well-nourished.  Neck: Normal range of motion. Neck supple.  Cardiovascular: Normal rate, regular rhythm and normal heart sounds.   Pulmonary/Chest: Effort normal. She has rhonchi in the right lower field and the left lower field.  Lymphadenopathy:    She has no cervical adenopathy.  Neurological: She is alert.  Skin: Skin is warm and dry.  Nursing note and vitals reviewed.    UC Treatments / Results  Labs (all labs ordered are listed, but only abnormal results are displayed) Labs Reviewed - No data to  display  EKG  EKG Interpretation None       Radiology No results found.  Procedures Procedures (including critical care time)  Medications Ordered in UC Medications - No data to display   Initial Impression / Assessment and Plan / UC Course  I have reviewed the triage vital signs and the nursing notes.  Pertinent labs & imaging results that were available during my care of the patient were reviewed by me and considered in my medical decision making (see chart for details).  Clinical Course      Final Clinical Impressions(s) / UC Diagnoses   Final diagnoses:  None    New Prescriptions New Prescriptions   No medications on file     Linna HoffJames D Balin Vandegrift, MD 11/12/15 1710

## 2015-11-12 NOTE — Discharge Instructions (Signed)
Take all of medicine, drink lots of fluids, no more smoking, see your doctor if further problems  °

## 2015-11-12 NOTE — ED Triage Notes (Signed)
Pt here for chest congestion x 2 weeks. sts some productivity. 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

## 2015-12-02 ENCOUNTER — Encounter (HOSPITAL_COMMUNITY): Payer: Self-pay | Admitting: Emergency Medicine

## 2015-12-02 ENCOUNTER — Ambulatory Visit (INDEPENDENT_AMBULATORY_CARE_PROVIDER_SITE_OTHER): Payer: Self-pay

## 2015-12-02 ENCOUNTER — Ambulatory Visit (HOSPITAL_COMMUNITY)
Admission: EM | Admit: 2015-12-02 | Discharge: 2015-12-02 | Disposition: A | Discharge status: Home / Self Care | Payer: Self-pay | Attending: Emergency Medicine | Admitting: Emergency Medicine

## 2015-12-02 DIAGNOSIS — J40 Bronchitis, not specified as acute or chronic: Secondary | ICD-10-CM

## 2015-12-02 MED ORDER — AZITHROMYCIN 250 MG PO TABS: ORAL_TABLET | ORAL | 0 refills | Status: DC

## 2015-12-02 MED ORDER — PREDNISONE 50 MG PO TABS: ORAL_TABLET | ORAL | 0 refills | Status: DC

## 2015-12-02 NOTE — ED Provider Notes (Signed)
[facility]    [name]: [id number] Arrival date & time: 12/02/15  1731     History   Chief Complaint Chief Complaint  Patient presents with  . Cough    HPI Erica [name] is a 35 y.o. female.   HPI  She is a 35 year old woman here for evaluation of cough. She reports a cough and congestion for the last 3-4 weeks. She was seen here about 2 weeks ago and diagnosed with bronchitis. She was given a prescription for doxycycline. She states she only took 3 of the antibiotics.  She states she seemed to get better, but then has been getting worse since she stopped taking the antibiotic. She reports nasal congestion and chest congestion. She also reports a cough with wheezing and shortness of breath. Cough is productive of yellow sputum. Subjective fevers at home. Reports getting short of breath walking from one room to the next.  She is a current smoker.  Past Medical History:  Diagnosis Date  . Abnormal Pap smear   . Anxiety    as teen  . Anxiety   . Depression    as teen  . GERD (gastroesophageal reflux disease)   . Headache(784.0)   . No pertinent past medical history   . VBAC, delivered, current hospitalization 05/19/2011    Patient Active Problem List   Diagnosis Date Noted  . URI (upper respiratory infection) 09/23/2010  . Right otitis media 09/23/2010  . Positive pregnancy test 09/23/2010    Past Surgical History:  Procedure Laterality Date  . CESAREAN SECTION    . COLPOSCOPY    . DILATION AND CURETTAGE OF UTERUS  2010  . WISDOM TOOTH EXTRACTION      OB History    Gravida Para Term Preterm AB Living   7 6 4 2 1 6    SAB TAB Ectopic Multiple Live Births   1 0 0 0 6       Home Medications    Prior to Admission medications   Medication Sig Start Date End Date Taking? Authorizing Provider  azithromycin (ZITHROMAX Z-PAK) 250 MG tablet Take 2 pills today, then 1 pill daily until gone. 12/02/15   Charm RingsErin J Avantae Bither, MD  predniSONE (DELTASONE) 50 MG tablet Take 1  pill daily for 5 days. 12/02/15   Charm RingsErin J Zoeann Mol, MD    Family History Family History  Problem Relation Age of Onset  . Hypertension Mother   . Seizures Mother   . Hypertension Maternal Grandmother   . Arthritis Maternal Grandmother   . Cancer Maternal Grandmother     breast  . Stroke Maternal Grandfather   . Seizures Brother   . Mental illness Brother     paranoid schizophrenia  . Birth defects Daughter     hole in heart  . Seizures Maternal Aunt   . Seizures Brother   . Seizures Cousin   . Seizures Cousin   . Mental retardation Cousin     Social History Social History  Substance Use Topics  . Smoking status: Current Every Day Smoker    Packs/day: 1.00    Years: 13.00    Types: Cigarettes  . Smokeless tobacco: Never Used  . Alcohol use Yes     Comment: rare     Allergies   Patient has no known allergies.   Review of Systems Review of Systems As in history of present illness  Physical Exam Triage Vital Signs ED Triage Vitals  Enc Vitals Group     BP  12/02/15 1759 125/76     Pulse Rate 12/02/15 1759 78     Resp 12/02/15 1759 18     Temp 12/02/15 1759 98.2 F (36.8 C)     Temp Source 12/02/15 1759 Oral     SpO2 12/02/15 1759 99 %     Weight --      Height --      Head Circumference --      Peak Flow --      Pain Score 12/02/15 1804 0     Pain Loc --      Pain Edu? --      Excl. in GC? --    No data found.   Updated Vital Signs BP 125/76 (BP Location: Left Arm)   Pulse 78   Temp 98.2 F (36.8 C) (Oral)   Resp 18   LMP 12/02/2015 (Exact Date)   SpO2 99%   Visual Acuity Right Eye Distance:   Left Eye Distance:   Bilateral Distance:    Right Eye Near:   Left Eye Near:    Bilateral Near:     Physical Exam  Constitutional: She is oriented to person, place, and time. She appears well-developed and well-nourished. No distress.  HENT:  Mouth/Throat: No oropharyngeal exudate.  Nasal mucosa is erythematous. Oropharynx mildly erythematous.    Cardiovascular: Normal rate, regular rhythm and normal heart sounds.   No murmur heard. Pulmonary/Chest: Effort normal. No respiratory distress. She has no wheezes. She has no rales.  Diminished air movement in right lung base  Lymphadenopathy:    She has no cervical adenopathy.  Neurological: She is alert and oriented to person, place, and time.     UC Treatments / Results  Labs (all labs ordered are listed, but only abnormal results are displayed) Labs Reviewed - No data to display  EKG  EKG Interpretation None       Radiology Dg Chest 2 View  Result Date: 12/02/2015 CLINICAL DATA:  Patient had a cough for 3 weeks. Dyspnea and sinus congestion. EXAM: CHEST  2 VIEW COMPARISON:  None. FINDINGS: The heart size and mediastinal contours are within normal limits. Pulmonary vasculature is unremarkable. No visible pleural effusions or pneumothoraces. Both lungs are clear. The visualized skeletal structures are unremarkable. IMPRESSION: No active cardiopulmonary disease. Electronically Signed   By: Tollie Ethavid  Kwon M.D.   On: 12/02/2015 19:09    Procedures Procedures (including critical care time)  Medications Ordered in UC Medications - No data to display   Initial Impression / Assessment and Plan / UC Course  I have reviewed the triage vital signs and the nursing notes.  Pertinent labs & imaging results that were available during my care of the patient were reviewed by me and considered in my medical decision making (see chart for details).  Clinical Course     X-ray is normal. Treat for bronchitis with azithromycin and prednisone. Follow-up as needed.  Final Clinical Impressions(s) / UC Diagnoses   Final diagnoses:  Bronchitis    New Prescriptions New Prescriptions   AZITHROMYCIN (ZITHROMAX Z-PAK) 250 MG TABLET    Take 2 pills today, then 1 pill daily until gone.   PREDNISONE (DELTASONE) 50 MG TABLET    Take 1 pill daily for 5 days.     Charm RingsErin J Semiah Konczal, MD 12/02/15  [phone]

## 2015-12-02 NOTE — Discharge Instructions (Signed)
You have bronchitis. Take azithromycin and prednisone as prescribed. Use your inhaler every 4 hours as needed for wheezing or cough. You should see improvement in the next 3-5 days. If you develop fevers, difficulty breathing, or are just not getting better, please come back or go to the emergency room.

## 2015-12-02 NOTE — ED Triage Notes (Signed)
[facility] a [facility] and SOB x 1 month. The patient reported that she has been having a cough and congestion x 1 month.She did report that she was evaluated on 11/12/2015 and prescribed antibiotics for a URI that she did not complete. She stated that her symptoms are worse now and she has trouble with SOB.

## 2016-03-04 ENCOUNTER — Encounter (HOSPITAL_COMMUNITY): Payer: Self-pay | Admitting: Emergency Medicine

## 2016-03-04 ENCOUNTER — Ambulatory Visit (HOSPITAL_COMMUNITY)
Admission: EM | Admit: 2016-03-04 | Discharge: 2016-03-04 | Disposition: A | Discharge status: Home / Self Care | Payer: Self-pay | Attending: Emergency Medicine | Admitting: Emergency Medicine

## 2016-03-04 DIAGNOSIS — R21 Rash and other nonspecific skin eruption: Secondary | ICD-10-CM

## 2016-03-04 DIAGNOSIS — B9689 Other specified bacterial agents as the cause of diseases classified elsewhere: Secondary | ICD-10-CM

## 2016-03-04 DIAGNOSIS — W57XXXA Bitten or stung by nonvenomous insect and other nonvenomous arthropods, initial encounter: Secondary | ICD-10-CM

## 2016-03-04 DIAGNOSIS — N760 Acute vaginitis: Secondary | ICD-10-CM

## 2016-03-04 DIAGNOSIS — L299 Pruritus, unspecified: Secondary | ICD-10-CM

## 2016-03-04 LAB — POCT URINALYSIS DIP (DEVICE)
Glucose, UA: NEGATIVE mg/dL
KETONES UR: NEGATIVE mg/dL
Leukocytes, UA: NEGATIVE
Nitrite: NEGATIVE
PROTEIN: NEGATIVE mg/dL
UROBILINOGEN UA: 0.2 mg/dL (ref 0.0–1.0)
pH: 5.5 (ref 5.0–8.0)

## 2016-03-04 LAB — POCT PREGNANCY, URINE: Preg Test, Ur: NEGATIVE

## 2016-03-04 MED ORDER — HYDROXYZINE HCL 25 MG PO TABS: ORAL_TABLET | ORAL | 0 refills | Status: DC

## 2016-03-04 MED ORDER — PERMETHRIN 5 % EX CREA: TOPICAL_CREAM | CUTANEOUS | 1 refills | Status: DC

## 2016-03-04 MED ORDER — CLINDAMYCIN HCL 300 MG PO CAPS: 300.0000 mg | ORAL_CAPSULE | Freq: Three times a day (TID) | ORAL | 0 refills | Status: DC

## 2016-03-04 NOTE — ED Provider Notes (Signed)
[name]: [id number]     Arrival date & time 03/04/16  1301 History   None    Chief Complaint  Patient presents with  . Insect Bite  . Vaginal Discharge   (Consider location/radiation/quality/duration/timing/severity/associated sxs/prior Treatment) Patient c/o rash on lower extremities bilateral, arms, and hands.  She states she was staying in a hotel that had bed bugs.  She states she has gotten bitten by insects a work.  She c/o itching and bug bites on her arms.  She is having vaginal DC that is like BV she states.   The history is provided by the patient.  Vaginal Discharge  Quality:  White Severity:  Moderate Onset quality:  Sudden Duration:  2 days Timing:  Constant Progression:  Worsening Chronicity:  New Relieved by:  Nothing Worsened by:  Nothing Ineffective treatments:  None tried   Past Medical History:  Diagnosis Date  . Abnormal Pap smear   . Anxiety    as teen  . Anxiety   . Depression    as teen  . GERD (gastroesophageal reflux disease)   . Headache(784.0)   . No pertinent past medical history   . VBAC, delivered, current hospitalization 05/19/2011   Past Surgical History:  Procedure Laterality Date  . CESAREAN SECTION    . COLPOSCOPY    . DILATION AND CURETTAGE OF UTERUS  2010  . WISDOM TOOTH EXTRACTION     Family History  Problem Relation Age of Onset  . Hypertension Mother   . Seizures Mother   . Hypertension Maternal Grandmother   . Arthritis Maternal Grandmother   . Cancer Maternal Grandmother     breast  . Stroke Maternal Grandfather   . Seizures Brother   . Mental illness Brother     paranoid schizophrenia  . Birth defects Daughter     hole in heart  . Seizures Maternal Aunt   . Seizures Brother   . Seizures Cousin   . Seizures Cousin   . Mental retardation Cousin    Social History  Substance Use Topics  . Smoking status: Current Every Day Smoker    Packs/day: 1.00    Years: 13.00    Types: Cigarettes  . Smokeless tobacco: Never  Used  . Alcohol use Yes     Comment: rare   OB History    Gravida Para Term Preterm AB Living   7 6 4 2 1 6    SAB TAB Ectopic Multiple Live Births   1 0 0 0 6     Review of Systems  Constitutional: Negative.   HENT: Negative.   Eyes: Negative.   Respiratory: Negative.   Cardiovascular: Negative.   Gastrointestinal: Negative.   Endocrine: Negative.   Genitourinary: Positive for vaginal discharge.  Musculoskeletal: Positive for back pain.  Allergic/Immunologic: Negative.   Neurological: Negative.   Hematological: Negative.   Psychiatric/Behavioral: Negative.     Allergies  Patient has no known allergies.  Home Medications   Prior to Admission medications   Medication Sig Start Date End Date Taking? Authorizing Provider  azithromycin (ZITHROMAX Z-PAK) 250 MG tablet Take 2 pills today, then 1 pill daily until gone. 12/02/15   Charm RingsErin J Honig, MD  clindamycin (CLEOCIN) 300 MG capsule Take 1 capsule (300 mg total) by mouth 3 (three) times daily. 03/04/16   Deatra CanterWilliam J Ansel Ferrall, FNP  hydrOXYzine (ATARAX/VISTARIL) 25 MG tablet One to two po q 6 hours prn 03/04/16   Deatra CanterWilliam J Dorene Bruni, FNP  permethrin (ELIMITE) 5 % cream Apply to  affected area once 03/04/16   Deatra Canter, FNP  predniSONE (DELTASONE) 50 MG tablet Take 1 pill daily for 5 days. 12/02/15   Charm Rings, MD   Meds Ordered and Administered this Visit  Medications - No data to display  BP 121/74 (BP Location: Right Arm)   Pulse 83   Temp 97.8 F (36.6 C) (Oral)   Resp 20   LMP 02/23/2016   SpO2 99%  No data found.   Physical Exam  Constitutional: She appears well-developed and well-nourished.  HENT:  Head: Normocephalic and atraumatic.  Eyes: Conjunctivae and EOM are normal. Pupils are equal, round, and reactive to light.  Neck: Normal range of motion. Neck supple.  Cardiovascular: Normal rate, regular rhythm and normal heart sounds.   Pulmonary/Chest: Effort normal and breath sounds normal.  Abdominal: Soft. Bowel  sounds are normal.  Skin: Rash noted.  Left forearm with injection sites like insect bite.  Plaques on bilateral ankles and feet and legs and she has them on bilateral arms.  Nursing note and vitals reviewed.   Urgent Care Course     Procedures (including critical care time)  Labs Review Labs Reviewed  POCT URINALYSIS DIP (DEVICE) - Abnormal; Notable for the following:       Result Value   Bilirubin Urine SMALL (*)    Hgb urine dipstick TRACE (*)    All other components within normal limits  POCT PREGNANCY, URINE    Imaging Review No results found.   Visual Acuity Review  Right Eye Distance:   Left Eye Distance:   Bilateral Distance:    Right Eye Near:   Left Eye Near:    Bilateral Near:         MDM   1. Insect bite, initial encounter   2. Itching   3. Rash   4. BV (bacterial vaginosis)    Permethrin Cream apply once #71ml Clindamycin 300mg  one po bid x 7 days Hydroxyzine 25mg  one to two po q 6 hours #24       Deatra Canter, FNP 03/04/16 1555

## 2016-03-04 NOTE — ED Triage Notes (Signed)
Here for insect bite all over body onset 2 days  Does not know if it's coming from her job or motel where she is staying  Also c/o UTI sx onset x1.5 weeks associated w/vag d/c, dysuria  Sexually active... New partner a month ago and did not use condoms  A&O x4... NAD

## 2016-06-25 ENCOUNTER — Emergency Department
Admission: EM | Admit: 2016-06-25 | Discharge: 2016-06-26 | Disposition: A | Discharge status: Home / Self Care | Payer: Medicaid Other | Attending: Emergency Medicine | Admitting: Emergency Medicine

## 2016-06-25 ENCOUNTER — Encounter: Payer: Self-pay | Admitting: *Deleted

## 2016-06-25 DIAGNOSIS — F17210 Nicotine dependence, cigarettes, uncomplicated: Secondary | ICD-10-CM | POA: Diagnosis not present

## 2016-06-25 DIAGNOSIS — T7840XA Allergy, unspecified, initial encounter: Secondary | ICD-10-CM | POA: Insufficient documentation

## 2016-06-25 MED ORDER — DIPHENHYDRAMINE HCL 50 MG/ML IJ SOLN
50.0000 mg | Freq: Once | INTRAMUSCULAR | Status: AC
  Administered 2016-06-25: 50 mg via INTRAVENOUS
  Filled 2016-06-25: qty 1

## 2016-06-25 MED ORDER — FAMOTIDINE IN NACL 20-0.9 MG/50ML-% IV SOLN
20.0000 mg | Freq: Once | INTRAVENOUS | Status: AC
  Administered 2016-06-25: 20 mg via INTRAVENOUS
  Filled 2016-06-25: qty 50

## 2016-06-25 MED ORDER — METHYLPREDNISOLONE SODIUM SUCC 125 MG IJ SOLR
125.0000 mg | Freq: Once | INTRAMUSCULAR | Status: AC
  Administered 2016-06-25: 125 mg via INTRAVENOUS
  Filled 2016-06-25: qty 2

## 2016-06-25 NOTE — ED Triage Notes (Signed)
Pt reports taking one dose of Levaquin tonight, pt has a hives to bilateral arms and legs that are itching her , pt reports bottom lip is swollen, pt having no problems breathing

## 2016-06-25 NOTE — ED Provider Notes (Signed)
[facility] Emergency Department Provider Note   ____________________________________________   First MD Initiated Contact with Patient 06/25/16 2332     (approximate)  I have reviewed the triage vital signs and the nursing notes.   HISTORY  Chief Complaint Allergic Reaction    HPI Erica [name] is a 36 y.o. female who reports she took one Levaquin earlier tonight for an upper respiratory tract infection. Shortly thereafter she began having itching and hives have come up all over her body. She reports her lower lip is slightly swollen she's never had this problem before. She's not had any trouble breathing. Voice is normal. Other history is that she has come off cocaine 5 days ago.   Past Medical History:  Diagnosis Date  . Abnormal Pap smear   . Anxiety    as teen  . Anxiety   . Depression    as teen  . GERD (gastroesophageal reflux disease)   . Headache(784.0)   . No pertinent past medical history   . VBAC, delivered, current hospitalization 05/19/2011    Patient Active Problem List   Diagnosis Date Noted  . URI (upper respiratory infection) 09/23/2010  . Right otitis media 09/23/2010  . Positive pregnancy test 09/23/2010    Past Surgical History:  Procedure Laterality Date  . CESAREAN SECTION    . COLPOSCOPY    . DILATION AND CURETTAGE OF UTERUS  2010  . WISDOM TOOTH EXTRACTION      Prior to Admission medications   Medication Sig Start Date End Date Taking? Authorizing Provider  azithromycin (ZITHROMAX Z-PAK) 250 MG tablet Take 2 pills today, then 1 pill daily until gone. 12/02/15   Charm RingsHonig, Erin J, MD  clindamycin (CLEOCIN) 300 MG capsule Take 1 capsule (300 mg total) by mouth 3 (three) times daily. 03/04/16   Deatra Canterxford, William J, FNP  famotidine (PEPCID) 40 MG tablet Take 1 tablet (40 mg total) by mouth every evening. 06/26/16 06/26/17  Arnaldo NatalMalinda, Versia Mignogna F, MD  hydrOXYzine (ATARAX/VISTARIL) 25 MG tablet One to two po q 6 hours prn 03/04/16   Deatra Canterxford,  William J, FNP  permethrin (ELIMITE) 5 % cream Apply to affected area once 03/04/16   Deatra Canterxford, William J, FNP  predniSONE (DELTASONE) 20 MG tablet Take 1 tablet (20 mg total) by mouth daily. 06/26/16 06/26/17  Arnaldo NatalMalinda, Jazyiah Yiu F, MD  predniSONE (DELTASONE) 50 MG tablet Take 1 pill daily for 5 days. 12/02/15   Charm RingsHonig, Erin J, MD    Allergies Patient has no known allergies.  Family History  Problem Relation Age of Onset  . Hypertension Mother   . Seizures Mother   . Hypertension Maternal Grandmother   . Arthritis Maternal Grandmother   . Cancer Maternal Grandmother        breast  . Stroke Maternal Grandfather   . Seizures Brother   . Mental illness Brother        paranoid schizophrenia  . Birth defects Daughter        hole in heart  . Seizures Maternal Aunt   . Seizures Brother   . Seizures Cousin   . Seizures Cousin   . Mental retardation Cousin     Social History Social History  Substance Use Topics  . Smoking status: Current Every Day Smoker    Packs/day: 1.00    Years: 13.00    Types: Cigarettes  . Smokeless tobacco: Never Used  . Alcohol use Yes     Comment: rare    Review of Systems  Constitutional: No fever/chills Eyes: No visual changes. ENT: No sore throat. Cardiovascular: Denies chest pain. Respiratory: Denies shortness of breath. Gastrointestinal: No abdominal pain.  No nausea, no vomiting.  No diarrhea.  No constipation. Genitourinary: Negative for dysuria. Musculoskeletal: Negative for back pain. Skin: Negative for rash. Neurological: Negative for headaches, focal weakness  ____________________________________________   PHYSICAL EXAM:  VITAL SIGNS: ED Triage Vitals  Enc Vitals Group     BP 06/25/16 2331 (!) 141/85     Pulse Rate 06/25/16 2331 98     Resp 06/25/16 2331 20     Temp 06/25/16 2331 97.8 F (36.6 C)     Temp Source 06/25/16 2331 Oral     SpO2 06/25/16 2331 100 %     Weight 06/25/16 2332 140 lb (63.5 kg)     Height 06/25/16 2332 5\' 3"   (1.6 m)     Head Circumference --      Peak Flow --      Pain Score --      Pain Loc --      Pain Edu? --      Excl. in GC? --     Constitutional: Alert and oriented. Well appearing and in no acute distressBut complaining of itching. Eyes: Conjunctivae are normal. Head: Atraumatic. Nose: No congestion/rhinnorhea. Mouth/Throat: Mucous membranes are moist.  Oropharynx non-erythematous. Neck: No stridor.  Cardiac:grossly normal heart sounds.  Good peripheral circulation. Respiratory: Normal respiratory effort.  No retractions. Lungs CTAB. Gastrointestinal: Soft and nontender. No distention. No abdominal bruits. No CVA tenderness. Musculoskeletal: No lower extremity tenderness nor edema.  No joint effusions. Neurologic:  Normal speech and language. No gross focal neurologic deficits are appreciated.  Skin:  Skin is warm, dry and intact. Hives diffusely   ____________________________________________   LABS (all labs ordered are listed, but only abnormal results are displayed)  Labs Reviewed - No data to display ____________________________________________  EKG   ____________________________________________  RADIOLOGY   ____________________________________________   PROCEDURES  Procedure(s) performed:  Procedures  Critical Care performed:   ____________________________________________   INITIAL IMPRESSION / ASSESSMENT AND PLAN / ED COURSE  Pertinent labs & imaging results that were available during my care of the patient were reviewed by me and considered in my medical decision making (see chart for details).   Patient doing well on discharge no further symptoms. She understands the need to return if she gets worse again. She will call 911 if she short of breath.     ____________________________________________   FINAL CLINICAL IMPRESSION(S) / ED DIAGNOSES  Final diagnoses:  Allergic reaction, initial encounter      NEW MEDICATIONS STARTED DURING  THIS VISIT:  New Prescriptions   FAMOTIDINE (PEPCID) 40 MG TABLET    Take 1 tablet (40 mg total) by mouth every evening.   PREDNISONE (DELTASONE) 20 MG TABLET    Take 1 tablet (20 mg total) by mouth daily.     Note:  This document was prepared using Dragon voice recognition software and may include unintentional dictation errors.    Arnaldo Natal, MD 06/26/16 [phone]

## 2016-06-26 MED ORDER — PREDNISONE 20 MG PO TABS: 20.0000 mg | ORAL_TABLET | Freq: Every day | ORAL | 0 refills | Status: DC

## 2016-06-26 MED ORDER — FAMOTIDINE 40 MG PO TABS: 40.0000 mg | ORAL_TABLET | Freq: Every evening | ORAL | 0 refills | Status: DC

## 2016-06-26 NOTE — ED Notes (Signed)
Pt states she took a levaquin an hour ago and began to have hives, rash and itching. Pt states she feels like her bottom lip is swollen. Pt is anxious. Pt with hives noted to bilateral forearms. Breath sounds clear, pt states she did feel like she was going to have diarrhea and "felt tight in my throat" with initiation of hives.

## 2016-06-26 NOTE — ED Notes (Signed)
Pt. Going home with family. 

## 2016-06-26 NOTE — Discharge Instructions (Signed)
Take Benadryl to the over-the-counter pills 4 times a day for itching. I would take at least one dose in the morning. If you're not itching after that you can avoid the other doses. If you are taking the Benadryl take Pepcid one a day. If you not taking the Benadryl did not take the Pepcid. Take 1 prednisone 20 mg tomorrow evening unless your perfectly well. Return here if the itching does not stay gone. Do not take that medicine any more. Return by ambulance if he gets short of breath. Call 911

## 2016-06-26 NOTE — ED Notes (Signed)
Pt with improved itching and hives. No hives noted to forearms or feet at this time.

## 2016-06-26 NOTE — ED Notes (Signed)
Pt states "i can't stay here all night, I need to leave." pt informed again that MD will usually hold pt for 3-4 hours after medication given to monitor for "rebound allergic reaction". Pt continues to states "i can't sit up here all damn night, I need to see that doctor". Pt informed she has the right to sign out against medical advice. Pt declines ama. Dr. Darnelle Catalanmalinda notified pt would like to see him and is requesting discharge. Pt informed per dr. Darnelle Catalanmalinda "he is going to see someone that has been waiting and will be in to touch base with you as soon as he can." pt verbalizes understanding. Pt's visitor states "ya'll just can't sit on your asses and make her wait, she's got to go to work in the morning".

## 2016-06-26 NOTE — ED Notes (Signed)
md in to speak with pt regarding treatment plan.

## 2016-07-11 ENCOUNTER — Observation Stay
Admission: EM | Admit: 2016-07-11 | Discharge: 2016-07-13 | Disposition: A | Discharge status: Home / Self Care | Payer: Medicaid Other | Attending: Surgery | Admitting: Surgery

## 2016-07-11 ENCOUNTER — Observation Stay: Payer: Medicaid Other | Admitting: Anesthesiology

## 2016-07-11 ENCOUNTER — Encounter
Admission: EM | Disposition: A | Discharge status: Home / Self Care | Payer: Self-pay | Source: Home / Self Care | Attending: Emergency Medicine

## 2016-07-11 ENCOUNTER — Emergency Department: Payer: Medicaid Other

## 2016-07-11 ENCOUNTER — Encounter: Payer: Self-pay | Admitting: Emergency Medicine

## 2016-07-11 DIAGNOSIS — K3589 Other acute appendicitis without perforation or gangrene: Secondary | ICD-10-CM

## 2016-07-11 DIAGNOSIS — R109 Unspecified abdominal pain: Secondary | ICD-10-CM | POA: Diagnosis present

## 2016-07-11 DIAGNOSIS — K3580 Unspecified acute appendicitis: Principal | ICD-10-CM | POA: Diagnosis present

## 2016-07-11 DIAGNOSIS — F17210 Nicotine dependence, cigarettes, uncomplicated: Secondary | ICD-10-CM | POA: Insufficient documentation

## 2016-07-11 DIAGNOSIS — R1030 Lower abdominal pain, unspecified: Secondary | ICD-10-CM

## 2016-07-11 HISTORY — PX: LAPAROSCOPIC APPENDECTOMY: SHX408

## 2016-07-11 LAB — URINALYSIS, COMPLETE (UACMP) WITH MICROSCOPIC
BACTERIA UA: NONE SEEN
BILIRUBIN URINE: NEGATIVE
GLUCOSE, UA: NEGATIVE mg/dL
HGB URINE DIPSTICK: NEGATIVE
KETONES UR: 20 mg/dL — AB
NITRITE: NEGATIVE
PROTEIN: NEGATIVE mg/dL
Specific Gravity, Urine: 1.019 (ref 1.005–1.030)
pH: 5 (ref 5.0–8.0)

## 2016-07-11 LAB — CBC
HEMATOCRIT: 40.1 % (ref 35.0–47.0)
HEMOGLOBIN: 14.1 g/dL (ref 12.0–16.0)
MCH: 33 pg (ref 26.0–34.0)
MCHC: 35.1 g/dL (ref 32.0–36.0)
MCV: 94 fL (ref 80.0–100.0)
Platelets: 264 10*3/uL (ref 150–440)
RBC: 4.27 MIL/uL (ref 3.80–5.20)
RDW: 14.2 % (ref 11.5–14.5)
WBC: 24 10*3/uL — AB (ref 3.6–11.0)

## 2016-07-11 LAB — COMPREHENSIVE METABOLIC PANEL
ALT: 23 U/L (ref 14–54)
AST: 19 U/L (ref 15–41)
Albumin: 4.6 g/dL (ref 3.5–5.0)
Alkaline Phosphatase: 67 U/L (ref 38–126)
Anion gap: 8 (ref 5–15)
BUN: 11 mg/dL (ref 6–20)
CO2: 22 mmol/L (ref 22–32)
Calcium: 9.1 mg/dL (ref 8.9–10.3)
Chloride: 108 mmol/L (ref 101–111)
Creatinine, Ser: 0.7 mg/dL (ref 0.44–1.00)
GFR calc Af Amer: >60 mL/min (ref >60)
GFR calc non Af Amer: >60 mL/min (ref >60)
Glucose, Bld: 100 mg/dL — ABNORMAL HIGH (ref 65–99)
Potassium: 3.6 mmol/L (ref 3.5–5.1)
Sodium: 138 mmol/L (ref 135–145)
Total Bilirubin: 0.9 mg/dL (ref 0.3–1.2)
Total Protein: 7.3 g/dL (ref 6.5–8.1)

## 2016-07-11 LAB — URINE DRUG SCREEN, QUALITATIVE (ARMC ONLY)
Amphetamines, Ur Screen: NOT DETECTED
BARBITURATES, UR SCREEN: NOT DETECTED
Benzodiazepine, Ur Scrn: NOT DETECTED
CANNABINOID 50 NG, UR ~~LOC~~: NOT DETECTED
Cocaine Metabolite,Ur ~~LOC~~: NOT DETECTED
MDMA (ECSTASY) UR SCREEN: NOT DETECTED
Methadone Scn, Ur: NOT DETECTED
Opiate, Ur Screen: NOT DETECTED
PHENCYCLIDINE (PCP) UR S: NOT DETECTED
Tricyclic, Ur Screen: NOT DETECTED

## 2016-07-11 LAB — WET PREP, GENITAL
Clue Cells Wet Prep HPF POC: NONE SEEN
SPERM: NONE SEEN
TRICH WET PREP: NONE SEEN
Yeast Wet Prep HPF POC: NONE SEEN

## 2016-07-11 LAB — CHLAMYDIA/NGC RT PCR (ARMC ONLY)
Chlamydia Tr: NOT DETECTED
N gonorrhoeae: NOT DETECTED

## 2016-07-11 LAB — TROPONIN I: Troponin I: <0.03 ng/mL (ref <0.03)

## 2016-07-11 LAB — LIPASE, BLOOD: Lipase: 25 U/L (ref 11–51)

## 2016-07-11 LAB — ETHANOL

## 2016-07-11 LAB — POCT PREGNANCY, URINE: PREG TEST UR: NEGATIVE

## 2016-07-11 SURGERY — APPENDECTOMY, LAPAROSCOPIC
Anesthesia: General

## 2016-07-11 MED ORDER — ACETAMINOPHEN 10 MG/ML IV SOLN
INTRAVENOUS | Status: DC | PRN
  Administered 2016-07-11: 1000 mg via INTRAVENOUS

## 2016-07-11 MED ORDER — POLYETHYLENE GLYCOL 3350 17 G PO PACK
17.0000 g | PACK | Freq: Every day | ORAL | Status: DC | PRN
  Administered 2016-07-12: 17 g via ORAL
  Filled 2016-07-11: qty 1

## 2016-07-11 MED ORDER — KETOROLAC TROMETHAMINE 30 MG/ML IJ SOLN
30.0000 mg | Freq: Four times a day (QID) | INTRAMUSCULAR | Status: DC
  Administered 2016-07-11 (×2): 30 mg via INTRAVENOUS
  Filled 2016-07-11: qty 1

## 2016-07-11 MED ORDER — LIDOCAINE HCL (PF) 2 % IJ SOLN
INTRAMUSCULAR | Status: AC
  Filled 2016-07-11: qty 2

## 2016-07-11 MED ORDER — FENTANYL CITRATE (PF) 100 MCG/2ML IJ SOLN: 25.0000 ug | INTRAMUSCULAR | Status: DC | PRN

## 2016-07-11 MED ORDER — ACETAMINOPHEN NICU IV SYRINGE 10 MG/ML
INTRAVENOUS | Status: AC
  Filled 2016-07-11: qty 1

## 2016-07-11 MED ORDER — PROPOFOL 10 MG/ML IV BOLUS
INTRAVENOUS | Status: DC | PRN
  Administered 2016-07-11: 170 mg via INTRAVENOUS

## 2016-07-11 MED ORDER — SUCCINYLCHOLINE CHLORIDE 20 MG/ML IJ SOLN
INTRAMUSCULAR | Status: AC
  Filled 2016-07-11: qty 1

## 2016-07-11 MED ORDER — FENTANYL CITRATE (PF) 100 MCG/2ML IJ SOLN
INTRAMUSCULAR | Status: AC
  Filled 2016-07-11: qty 2

## 2016-07-11 MED ORDER — LIDOCAINE HCL (PF) 1 % IJ SOLN
INTRAMUSCULAR | Status: AC
  Filled 2016-07-11: qty 30

## 2016-07-11 MED ORDER — ONDANSETRON 4 MG PO TBDP
4.0000 mg | ORAL_TABLET | Freq: Once | ORAL | Status: AC | PRN
  Administered 2016-07-11: 4 mg via ORAL
  Filled 2016-07-11: qty 1

## 2016-07-11 MED ORDER — IOPAMIDOL (ISOVUE-300) INJECTION 61%
30.0000 mL | Freq: Once | INTRAVENOUS | Status: AC
  Administered 2016-07-11: 30 mL via ORAL

## 2016-07-11 MED ORDER — LACTATED RINGERS IV SOLN
125.0000 mL/h | INTRAVENOUS | Status: DC
  Administered 2016-07-11: 09:00:00 via INTRAVENOUS
  Administered 2016-07-11: 125 mL/h via INTRAVENOUS

## 2016-07-11 MED ORDER — PIPERACILLIN-TAZOBACTAM 3.375 G IVPB 30 MIN
3.3750 g | Freq: Once | INTRAVENOUS | Status: AC
  Administered 2016-07-11: 3.375 g via INTRAVENOUS
  Filled 2016-07-11: qty 50

## 2016-07-11 MED ORDER — HYDROMORPHONE HCL 1 MG/ML IJ SOLN
0.5000 mg | INTRAMUSCULAR | Status: DC | PRN
  Administered 2016-07-11 – 2016-07-12 (×3): 0.5 mg via INTRAVENOUS
  Filled 2016-07-11 (×3): qty 0.5

## 2016-07-11 MED ORDER — ONDANSETRON HCL 4 MG/2ML IJ SOLN
INTRAMUSCULAR | Status: AC
  Filled 2016-07-11: qty 2

## 2016-07-11 MED ORDER — PANTOPRAZOLE SODIUM 40 MG IV SOLR
40.0000 mg | Freq: Every day | INTRAVENOUS | Status: DC
  Administered 2016-07-11: 40 mg via INTRAVENOUS
  Filled 2016-07-11: qty 40

## 2016-07-11 MED ORDER — IOPAMIDOL (ISOVUE-300) INJECTION 61%
100.0000 mL | Freq: Once | INTRAVENOUS | Status: AC | PRN
  Administered 2016-07-11: 100 mL via INTRAVENOUS

## 2016-07-11 MED ORDER — ACETAMINOPHEN 500 MG PO TABS
1000.0000 mg | ORAL_TABLET | Freq: Four times a day (QID) | ORAL | Status: DC
  Administered 2016-07-11 – 2016-07-12 (×4): 1000 mg via ORAL
  Filled 2016-07-11 (×7): qty 2

## 2016-07-11 MED ORDER — ONDANSETRON HCL 4 MG/2ML IJ SOLN: 4.0000 mg | Freq: Once | INTRAMUSCULAR | Status: DC | PRN

## 2016-07-11 MED ORDER — ONDANSETRON HCL 4 MG/2ML IJ SOLN
4.0000 mg | Freq: Once | INTRAMUSCULAR | Status: DC
Start: 2016-07-11 — End: 2016-07-13
  Filled 2016-07-11: qty 2

## 2016-07-11 MED ORDER — HEPARIN SODIUM (PORCINE) 5000 UNIT/ML IJ SOLN
5000.0000 [IU] | Freq: Three times a day (TID) | INTRAMUSCULAR | Status: DC
  Administered 2016-07-11 – 2016-07-12 (×4): 5000 [IU] via SUBCUTANEOUS
  Filled 2016-07-11 (×5): qty 1

## 2016-07-11 MED ORDER — ONDANSETRON HCL 4 MG/2ML IJ SOLN
4.0000 mg | Freq: Four times a day (QID) | INTRAMUSCULAR | Status: DC | PRN
  Administered 2016-07-11: 4 mg via INTRAVENOUS

## 2016-07-11 MED ORDER — SUGAMMADEX SODIUM 200 MG/2ML IV SOLN
INTRAVENOUS | Status: AC
  Filled 2016-07-11: qty 2

## 2016-07-11 MED ORDER — PROPOFOL 10 MG/ML IV BOLUS
INTRAVENOUS | Status: AC
  Filled 2016-07-11: qty 20

## 2016-07-11 MED ORDER — LIDOCAINE HCL (CARDIAC) 20 MG/ML IV SOLN
INTRAVENOUS | Status: DC | PRN
  Administered 2016-07-11: 100 mg via INTRAVENOUS

## 2016-07-11 MED ORDER — KETOROLAC TROMETHAMINE 30 MG/ML IJ SOLN
INTRAMUSCULAR | Status: AC
  Filled 2016-07-11: qty 1

## 2016-07-11 MED ORDER — SUCCINYLCHOLINE CHLORIDE 20 MG/ML IJ SOLN
INTRAMUSCULAR | Status: DC | PRN
  Administered 2016-07-11: 100 mg via INTRAVENOUS

## 2016-07-11 MED ORDER — LIDOCAINE HCL 1 % IJ SOLN
INTRAMUSCULAR | Status: DC | PRN
  Administered 2016-07-11: 10 mL

## 2016-07-11 MED ORDER — DEXAMETHASONE SODIUM PHOSPHATE 10 MG/ML IJ SOLN
INTRAMUSCULAR | Status: AC
  Filled 2016-07-11: qty 1

## 2016-07-11 MED ORDER — OXYCODONE HCL 5 MG PO TABS
5.0000 mg | ORAL_TABLET | ORAL | Status: DC | PRN
  Administered 2016-07-11: 5 mg via ORAL
  Administered 2016-07-11 – 2016-07-13 (×3): 10 mg via ORAL
  Filled 2016-07-11: qty 2
  Filled 2016-07-11: qty 1
  Filled 2016-07-11 (×2): qty 2

## 2016-07-11 MED ORDER — BUPIVACAINE-EPINEPHRINE (PF) 0.25% -1:200000 IJ SOLN
INTRAMUSCULAR | Status: DC | PRN
  Administered 2016-07-11: 10 mL

## 2016-07-11 MED ORDER — ALUM & MAG HYDROXIDE-SIMETH 200-200-20 MG/5ML PO SUSP
15.0000 mL | ORAL | Status: DC | PRN
  Administered 2016-07-12 – 2016-07-13 (×2): 15 mL via ORAL
  Filled 2016-07-11 (×2): qty 30

## 2016-07-11 MED ORDER — ROCURONIUM BROMIDE 50 MG/5ML IV SOLN
INTRAVENOUS | Status: AC
  Filled 2016-07-11: qty 1

## 2016-07-11 MED ORDER — MORPHINE SULFATE (PF) 4 MG/ML IV SOLN
4.0000 mg | Freq: Once | INTRAVENOUS | Status: DC
  Filled 2016-07-11: qty 1

## 2016-07-11 MED ORDER — BUPIVACAINE-EPINEPHRINE (PF) 0.25% -1:200000 IJ SOLN
INTRAMUSCULAR | Status: AC
  Filled 2016-07-11: qty 30

## 2016-07-11 MED ORDER — MIDAZOLAM HCL 2 MG/2ML IJ SOLN
INTRAMUSCULAR | Status: DC | PRN
  Administered 2016-07-11: 2 mg via INTRAVENOUS

## 2016-07-11 MED ORDER — FENTANYL CITRATE (PF) 100 MCG/2ML IJ SOLN
INTRAMUSCULAR | Status: DC | PRN
  Administered 2016-07-11: 50 ug via INTRAVENOUS
  Administered 2016-07-11: 100 ug via INTRAVENOUS
  Administered 2016-07-11: 50 ug via INTRAVENOUS

## 2016-07-11 MED ORDER — SUGAMMADEX SODIUM 200 MG/2ML IV SOLN
INTRAVENOUS | Status: DC | PRN
  Administered 2016-07-11: 127 mg via INTRAVENOUS

## 2016-07-11 MED ORDER — MIDAZOLAM HCL 2 MG/2ML IJ SOLN
INTRAMUSCULAR | Status: AC
  Filled 2016-07-11: qty 2

## 2016-07-11 MED ORDER — ONDANSETRON 4 MG PO TBDP
4.0000 mg | ORAL_TABLET | Freq: Four times a day (QID) | ORAL | Status: DC | PRN
  Administered 2016-07-12: 4 mg via ORAL
  Filled 2016-07-11: qty 1

## 2016-07-11 MED ORDER — DEXAMETHASONE SODIUM PHOSPHATE 10 MG/ML IJ SOLN
INTRAMUSCULAR | Status: DC | PRN
  Administered 2016-07-11: 10 mg via INTRAVENOUS

## 2016-07-11 MED ORDER — PIPERACILLIN-TAZOBACTAM 3.375 G IVPB
3.3750 g | Freq: Three times a day (TID) | INTRAVENOUS | Status: DC
  Administered 2016-07-11 – 2016-07-13 (×6): 3.375 g via INTRAVENOUS
  Filled 2016-07-11 (×8): qty 50

## 2016-07-11 MED ORDER — ROCURONIUM BROMIDE 100 MG/10ML IV SOLN
INTRAVENOUS | Status: DC | PRN
  Administered 2016-07-11: 40 mg via INTRAVENOUS
  Administered 2016-07-11: 10 mg via INTRAVENOUS

## 2016-07-11 MED ORDER — DIPHENHYDRAMINE HCL 50 MG/ML IJ SOLN
25.0000 mg | Freq: Four times a day (QID) | INTRAMUSCULAR | Status: DC | PRN
  Administered 2016-07-11: 25 mg via INTRAVENOUS
  Filled 2016-07-11: qty 1

## 2016-07-11 SURGICAL SUPPLY — 46 items
ADH LQ OCL WTPRF AMP STRL LF (MISCELLANEOUS) ×1
ADHESIVE MASTISOL STRL (MISCELLANEOUS) ×3 IMPLANT
APPLIER CLIP 5 13 M/L LIGAMAX5 (MISCELLANEOUS)
APR CLP MED LRG 5 ANG JAW (MISCELLANEOUS)
BLADE SURG SZ11 CARB STEEL (BLADE) ×3 IMPLANT
BULB RESERV EVAC DRAIN JP 100C (MISCELLANEOUS) IMPLANT
CANISTER SUCT 1200ML W/VALVE (MISCELLANEOUS) ×3 IMPLANT
CATH TRAY 16F METER LATEX (MISCELLANEOUS) ×3 IMPLANT
CHLORAPREP W/TINT 26ML (MISCELLANEOUS) ×3 IMPLANT
CLIP APPLIE 5 13 M/L LIGAMAX5 (MISCELLANEOUS) IMPLANT
CLOSURE WOUND 1/2 X4 (GAUZE/BANDAGES/DRESSINGS) ×1
CUTTER FLEX LINEAR 45M (STAPLE) ×3 IMPLANT
DRAIN CHANNEL JP 19F (MISCELLANEOUS) IMPLANT
DRSG TEGADERM 2-3/8X2-3/4 SM (GAUZE/BANDAGES/DRESSINGS) ×9 IMPLANT
DRSG TELFA 4X3 1S NADH ST (GAUZE/BANDAGES/DRESSINGS) ×3 IMPLANT
ELECT REM PT RETURN 9FT ADLT (ELECTROSURGICAL) ×3
ELECTRODE REM PT RTRN 9FT ADLT (ELECTROSURGICAL) ×1 IMPLANT
ENDOPOUCH RETRIEVER 10 (MISCELLANEOUS) ×3 IMPLANT
GLOVE BIO SURGEON STRL SZ7.5 (GLOVE) ×3 IMPLANT
GLOVE INDICATOR 8.0 STRL GRN (GLOVE) ×3 IMPLANT
GOWN STRL REUS W/ TWL LRG LVL3 (GOWN DISPOSABLE) ×2 IMPLANT
GOWN STRL REUS W/TWL LRG LVL3 (GOWN DISPOSABLE) ×6
IRRIGATION STRYKERFLOW (MISCELLANEOUS) IMPLANT
IRRIGATOR STRYKERFLOW (MISCELLANEOUS)
IV NS 1000ML (IV SOLUTION) ×3
IV NS 1000ML BAXH (IV SOLUTION) ×1 IMPLANT
KIT RM TURNOVER STRD PROC AR (KITS) ×3 IMPLANT
LABEL OR SOLS (LABEL) ×3 IMPLANT
NDL HYPO 25X1 1.5 SAFETY (NEEDLE) ×1 IMPLANT
NEEDLE HYPO 25X1 1.5 SAFETY (NEEDLE) ×3 IMPLANT
NEEDLE VERESS 14GA 120MM (NEEDLE) ×3 IMPLANT
NS IRRIG 500ML POUR BTL (IV SOLUTION) ×3 IMPLANT
PACK LAP CHOLECYSTECTOMY (MISCELLANEOUS) ×3 IMPLANT
RELOAD 45 VASCULAR/THIN (ENDOMECHANICALS) ×3 IMPLANT
RELOAD STAPLE 45 2.5 WHT GRN (ENDOMECHANICALS) ×1 IMPLANT
RELOAD STAPLE 45 3.5 BLU ETS (ENDOMECHANICALS) ×1 IMPLANT
RELOAD STAPLE TA45 3.5 REG BLU (ENDOMECHANICALS) ×3 IMPLANT
SCALPEL HARMONIC ACE (MISCELLANEOUS) ×3 IMPLANT
SLEEVE ENDOPATH XCEL 5M (ENDOMECHANICALS) ×3 IMPLANT
STRIP CLOSURE SKIN 1/2X4 (GAUZE/BANDAGES/DRESSINGS) ×2 IMPLANT
SUT MNCRL 4-0 (SUTURE) ×3
SUT MNCRL 4-0 27XMFL (SUTURE) ×1
SUTURE MNCRL 4-0 27XMF (SUTURE) ×1 IMPLANT
TROCAR XCEL 12X100 BLDLESS (ENDOMECHANICALS) ×3 IMPLANT
TROCAR XCEL NON-BLD 5MMX100MML (ENDOMECHANICALS) ×3 IMPLANT
TUBING INSUFFLATOR HI FLOW (MISCELLANEOUS) ×3 IMPLANT

## 2016-07-11 NOTE — ED Notes (Signed)
Pt with rlq pain for "days". Pt states she was nauseated and had emesis yesterday. Pt states zofran received in triage took nausea away. Pt states pain is worse to rlq lying flat and standing up. Skin normal color warm and dry. Pt states recent new sexual partner and also has increased white vaginal discharge.

## 2016-07-11 NOTE — Anesthesia Preprocedure Evaluation (Signed)
Anesthesia Evaluation  Patient identified by MRN, date of birth, ID band Patient awake    Reviewed: Allergy & Precautions, NPO status , Patient's Chart, lab work & pertinent test results  History of Anesthesia Complications Negative for: history of anesthetic complications  Airway Mallampati: II       Dental   Pulmonary Current Smoker,           Cardiovascular negative cardio ROS       Neuro/Psych Anxiety Depression    GI/Hepatic Neg liver ROS, GERD  Poorly Controlled,  Endo/Other  negative endocrine ROS  Renal/GU negative Renal ROS     Musculoskeletal   Abdominal   Peds  Hematology negative hematology ROS (+)   Anesthesia Other Findings   Reproductive/Obstetrics                             Anesthesia Physical Anesthesia Plan  ASA: II and emergent  Anesthesia Plan: General   Post-op Pain Management:    Induction: Intravenous and Rapid sequence  PONV Risk Score and Plan: 2 and Ondansetron and Dexamethasone  Airway Management Planned: Oral ETT  Additional Equipment:   Intra-op Plan:   Post-operative Plan:   Informed Consent: I have reviewed the patients History and Physical, chart, labs and discussed the procedure including the risks, benefits and alternatives for the proposed anesthesia with the patient or authorized representative who has indicated his/her understanding and acceptance.     Plan Discussed with:   Anesthesia Plan Comments:         Anesthesia Quick Evaluation

## 2016-07-11 NOTE — Progress Notes (Addendum)
Notified Dr Tonita CongWoodham of pt c/o itching; orders received

## 2016-07-11 NOTE — ED Notes (Signed)
Pt asking rn questions regarding turn around time for pelvic exam results. This rn truthfully answering pt's questions. Pt becoming irate with this rn swearing at this rn and becoming increasing verbally abusive. Pt states "get out, you fucking moron". Report to ann cales, rn.

## 2016-07-11 NOTE — Anesthesia Postprocedure Evaluation (Signed)
Anesthesia Post Note  Patient: Erica [name]  Procedure(s) Performed: Procedure(s) (LRB): APPENDECTOMY LAPAROSCOPIC (N/A)  Patient location during evaluation: PACU Anesthesia Type: General Level of consciousness: awake and alert Pain management: pain level controlled Vital Signs Assessment: post-procedure vital signs reviewed and stable Respiratory status: spontaneous breathing and respiratory function stable Cardiovascular status: stable Anesthetic complications: no     Last Vitals:  Vitals:   07/11/16 0730 07/11/16 0814  BP: 124/75 111/67  Pulse: 79 71  Resp:  18  Temp:  36.8 C    Last Pain:  Vitals:   07/11/16 0833  TempSrc:   PainSc: 6                  KEPHART,WILLIAM K

## 2016-07-11 NOTE — ED Notes (Signed)
Pt has finished po contrast. Ct notified.  

## 2016-07-11 NOTE — ED Notes (Signed)
Pt and family very concerned regarding wait. Explanation of delay provided to pt and family who continue to state concern because "we have things we need to do."

## 2016-07-11 NOTE — ED Provider Notes (Signed)
[facility] Emergency Department Provider Note       Time seen: ----------------------------------------- 4:43 AM on 07/11/2016 -----------------------------------------     I have reviewed the triage vital signs and the nursing notes.   HISTORY   Chief Complaint Abdominal Pain and Emesis    HPI Erica [name] is a 36 y.o. female who presents to the ED for epigastric pain since Friday night after eating chicken. Patient states the pain is an aching nature and then describes it as sharp. She reports the pain moves down to her lower abdomen as well. She has had dark and cloudy urine at times she is also having a foul smelling vaginal discharge and she has had intermittent vomiting for the last day or so.   Past Medical History:  Diagnosis Date  . Abnormal Pap smear   . Anxiety    as teen  . Anxiety   . Depression    as teen  . GERD (gastroesophageal reflux disease)   . Headache(784.0)   . No pertinent past medical history   . VBAC, delivered, current hospitalization 05/19/2011    Patient Active Problem List   Diagnosis Date Noted  . URI (upper respiratory infection) 09/23/2010  . Right otitis media 09/23/2010  . Positive pregnancy test 09/23/2010    Past Surgical History:  Procedure Laterality Date  . CESAREAN SECTION    . COLPOSCOPY    . DILATION AND CURETTAGE OF UTERUS  2010  . WISDOM TOOTH EXTRACTION      Allergies Levaquin [levofloxacin in d5w]  Social History Social History  Substance Use Topics  . Smoking status: Current Every Day Smoker    Packs/day: 1.00    Years: 13.00    Types: Cigarettes  . Smokeless tobacco: Never Used  . Alcohol use Yes     Comment: rare    Review of Systems Constitutional: Negative for fever. Cardiovascular: Negative for chest pain. Respiratory: Negative for shortness of breath. Gastrointestinal: Positive for abdominal pain and vomiting Genitourinary: Negative for dysuria. Musculoskeletal:  Negative for back pain. Skin: Negative for rash. Neurological: Negative for headaches, focal weakness or numbness.  All systems negative/normal/unremarkable except as stated in the HPI  ____________________________________________   PHYSICAL EXAM:  VITAL SIGNS: ED Triage Vitals  Enc Vitals Group     BP 07/11/16 0314 132/76     Pulse Rate 07/11/16 0314 (!) 102     Resp --      Temp 07/11/16 0314 98.4 F (36.9 C)     Temp Source 07/11/16 0314 Oral     SpO2 07/11/16 0314 98 %     Weight 07/11/16 0314 140 lb (63.5 kg)     Height 07/11/16 0314 5' 3.5" (1.613 m)     Head Circumference --      Peak Flow --      Pain Score 07/11/16 0326 10     Pain Loc --      Pain Edu? --      Excl. in GC? --     Constitutional: Alert and oriented. Well appearing and in no distress. Eyes: Conjunctivae are normal. Normal extraocular movements. ENT   Head: Normocephalic and atraumatic.   Nose: No congestion/rhinnorhea.   Mouth/Throat: Mucous membranes are moist.   Neck: No stridor. Cardiovascular: Normal rate, regular rhythm. No murmurs, rubs, or gallops. Respiratory: Normal respiratory effort without tachypnea nor retractions. Breath sounds are clear and equal bilaterally. No wheezes/rales/rhonchi. Gastrointestinal: Diffuse lower abdominal tenderness, no rebound or guarding. Normal bowel sounds.  Genitourinary: Cervical motion tenderness, light vaginal discharge Musculoskeletal: Nontender with normal range of motion in extremities. No lower extremity tenderness nor edema. Neurologic:  Normal speech and language. No gross focal neurologic deficits are appreciated.  Skin:  Skin is warm, dry and intact. No rash noted. Psychiatric: Mood and affect are normal. Speech and behavior are normal.  ____________________________________________  EKG: Interpreted by me. Sinus rhythm rate 99 bpm, normal PR interval, normal QRS, normal QT.  ____________________________________________  ED  COURSE:  Pertinent labs & imaging results that were available during my care of the patient were reviewed by me and considered in my medical decision making (see chart for details). Patient presents for abdominal pain, we will assess with labs and imaging as indicated.   Procedures ____________________________________________   LABS (pertinent positives/negatives)  Labs Reviewed  WET PREP, GENITAL - Abnormal; Notable for the following:       Result Value   WBC, Wet Prep HPF POC MANY (*)    All other components within normal limits  COMPREHENSIVE METABOLIC PANEL - Abnormal; Notable for the following:    Glucose, Bld 100 (*)    All other components within normal limits  CBC - Abnormal; Notable for the following:    WBC 24.0 (*)    All other components within normal limits  URINALYSIS, COMPLETE (UACMP) WITH MICROSCOPIC - Abnormal; Notable for the following:    Color, Urine YELLOW (*)    APPearance HAZY (*)    Ketones, ur 20 (*)    Leukocytes, UA SMALL (*)    Squamous Epithelial / LPF 6-30 (*)    All other components within normal limits  CHLAMYDIA/NGC RT PCR ([facility] ONLY)  LIPASE, BLOOD  TROPONIN I  ETHANOL  URINE DRUG SCREEN, QUALITATIVE ([facility] ONLY)  POCT PREGNANCY, URINE    RADIOLOGY  CT of abdomen and pelvis with contrast Concerning for acute appendicitis ____________________________________________  FINAL ASSESSMENT AND PLAN  Abdominal pain, appendicitis  Plan: Patient's labs and imaging were dictated above. Patient had presented for abdominal pain which was nonspecific but she was tender in the lower abdomen. CT scan was concerning for appendicitis. I have ordered IV Zosyn and consult at surgery for evaluation.   Emily FilbertWilliams, Emerald Gehres E, MD   Note: This note was generated in part or whole with voice recognition software. Voice recognition is usually quite accurate but there are transcription errors that can and very often do occur. I apologize for any  typographical errors that were not detected and corrected.     Emily FilbertWilliams, Kandise Riehle E, MD 07/11/16 [phone]

## 2016-07-11 NOTE — Transfer of Care (Signed)
Immediate Anesthesia Transfer of Care Note  Patient: Erica CourserLisa M [name]  Procedure(s) Performed: Procedure(s): APPENDECTOMY LAPAROSCOPIC (N/A)  Patient Location: PACU  Anesthesia Type:General  Level of Consciousness: awake, alert , oriented and sedated  Airway & Oxygen Therapy: Patient Spontanous Breathing and Patient connected to face mask oxygen  Post-op Assessment: Report given to RN and Post -op Vital signs reviewed and stable  Post vital signs: Reviewed and stable  Last Vitals:  Vitals:   07/11/16 0730 07/11/16 0814  BP: 124/75 111/67  Pulse: 79 71  Resp:  18  Temp:  36.8 C    Last Pain:  Vitals:   07/11/16 0833  TempSrc:   PainSc: 6          Complications: No apparent anesthesia complications

## 2016-07-11 NOTE — Anesthesia Procedure Notes (Signed)
Procedure Name: Intubation Date/Time: 07/11/2016 10:03 AM Performed by: Nelda Marseille Pre-anesthesia Checklist: Patient identified, Patient being monitored, Timeout performed, Emergency Drugs available and Suction available Patient Re-evaluated:Patient Re-evaluated prior to inductionOxygen Delivery Method: Circle system utilized Preoxygenation: Pre-oxygenation with 100% oxygen Intubation Type: IV induction Ventilation: Mask ventilation without difficulty Laryngoscope Size: Mac and 3 Grade View: Grade I Tube type: Oral Tube size: 7.0 mm Number of attempts: 1 Airway Equipment and Method: Stylet Placement Confirmation: ETT inserted through vocal cords under direct vision,  positive ETCO2 and breath sounds checked- equal and bilateral Secured at: 21 cm Tube secured with: Tape Dental Injury: Teeth and Oropharynx as per pre-operative assessment

## 2016-07-11 NOTE — Brief Op Note (Signed)
07/11/2016  10:12 AM  PATIENT:  Rosezella FloridaLisa M Fountain  36 y.o. female  PRE-OPERATIVE DIAGNOSIS:  acute appendicitis  POST-OPERATIVE DIAGNOSIS:  same  PROCEDURE:  Procedure(s): APPENDECTOMY LAPAROSCOPIC (N/A)  SURGEON:  Surgeon(s) and Role:    * Ricarda FrameWoodham, Princessa Lesmeister, MD - Primary  PHYSICIAN ASSISTANT:   ASSISTANTS: none   ANESTHESIA:   general  EBL:  No intake/output data recorded.  BLOOD ADMINISTERED:none  DRAINS: none   LOCAL MEDICATIONS USED:  MARCAINE   , XYLOCAINE  and Amount: 20 ml  SPECIMEN:  Source of Specimen:  appendix  DISPOSITION OF SPECIMEN:  PATHOLOGY  COUNTS:  YES  TOURNIQUET:  * No tourniquets in log *  DICTATION: .Dragon Dictation  PLAN OF CARE: Admit for overnight observation  PATIENT DISPOSITION:  PACU - hemodynamically stable.   Delay start of Pharmacological VTE agent (>24hrs) due to surgical blood loss or risk of bleeding: no

## 2016-07-11 NOTE — ED Notes (Signed)
Pt was given wipes to clean up with after pelvic exam. Pt pelvic exam specimens were sent to lab

## 2016-07-11 NOTE — ED Triage Notes (Signed)
Pt reports epigastric pain since Friday night after eating chicken; says pain is aching and then says pain is sharp; reports pain moving at times down to middle of her lower abd; dark urine and cloudy at times; vomiting intermittently over the last 24-36 hours

## 2016-07-11 NOTE — Anesthesia Post-op Follow-up Note (Cosign Needed)
Anesthesia QCDR form completed.        

## 2016-07-11 NOTE — ED Notes (Signed)
Patient transported to CT 

## 2016-07-11 NOTE — Op Note (Signed)
laparascopic appendectomy   Erica [name] Date of operation:  07/11/2016  Indications: The patient presented with a history of  abdominal pain. Workup has revealed findings consistent with acute appendicitis.  Pre-operative Diagnosis: Acute appendicitis without mention of peritonitis  Post-operative Diagnosis: Same  Surgeon: Leonette Mostharles T. Tonita CongWoodham, MD, FACS  Anesthesia: General with endotracheal tube  Procedure Details  The patient was seen again in the preop area. The options of surgery versus observation were reviewed with the patient and/or family. The risks of bleeding, infection, recurrence of symptoms, negative laparoscopy, potential for an open procedure, bowel injury, abscess or infection, were all reviewed as well. The patient was taken to Operating Room, identified as Erica [name] and the procedure verified as laparoscopic appendectomy. A Time Out was held and the above information confirmed.  The patient was placed in the supine position and general anesthesia was induced.  Antibiotic prophylaxis was administered and VT E prophylaxis was in place. A Foley catheter was placed by the nursing staff.   The abdomen was prepped and draped in a sterile fashion. An incision was made 2 fingerbreadths below the left costal margin in the midclavicular line due to scarring around the umbilicus from prior surgeries. A Veress needle was placed and pneumoperitoneum was obtained. A 5 mm trocar port was placed without difficulty and the abdominal cavity was explored.  Under direct vision a 5 mm infraumbilical port was placed and a 12 mm left lateral port was placed all under direct vision.  The appendix was identified and found to be acutely inflamed without obvious evidence of rupture but with evidence of inflammatory fluid around the appendix and into the pelvis.   The appendix was carefully dissected. The mesoappendix was divided using a Harmonic scalpel along the body of the appendix. The base of the  appendix was dissected out and divided with a standard load Endo GIA. The appendix was passed out through the left lateral port site with the aid of an Endo Catch bag. The right lower quadrant and pelvis was then irrigated with copious amounts of normal saline which was aspirated. Inspection failed to identify any additional bleeding and there were no signs of bowel injury.   Again the right lower quadrant was inspected there was no sign of bleeding or bowel injury therefore pneumoperitoneum was released, all ports were removed and the skin incisions were approximated with subcuticular 4-0 Monocryl. Steri-Strips and Mastisol and sterile dressings were placed.  The patient tolerated the procedure well, there were no complications. The sponge lap and needle count were correct at the end of the procedure.  The patient was taken to the recovery room in stable condition to be admitted for continued care.  Findings: Acute separative appendicitis  Estimated Blood Loss: 5 mL                  Specimens: appendix         Complications:  None                  Erica Frameharles Marium Ragan MD, FACS

## 2016-07-11 NOTE — Progress Notes (Signed)
Discussed with patient she could not go outside, stated she was going anyway. Md paged.

## 2016-07-11 NOTE — H&P (Signed)
Date of Admission:  07/11/2016  Reason for Admission:  Abdominal pain  History of Present Illness: Erica [name] is a 36 y.o. female who presents with a 1 day history of abdominal pain.  Started yesterday evening with periumbilical pain, associated with nausea and vomiting episodes x 4.  The pain has now radiated as well towards the right lower quadrant.  The patient reports that she has had dysuria recently and took two doses of Bactrim a few days ago.  She thought it could be a UTI given her symptoms.  Reports low grade temps of 99.6 at home, and chills last night.  On workup, was noted to have elevated WBC of 24.  CT scan showed a dilated appendix to 9.3 mm with surrounding inflammation and stranding.   Past Medical History: Past Medical History:  Diagnosis Date  . Abnormal Pap smear   . Anxiety    as teen  . Anxiety   . Depression    as teen  . GERD (gastroesophageal reflux disease)   . Headache(784.0)   . No pertinent past medical history   . VBAC, delivered, current hospitalization 05/19/2011     Past Surgical History: Past Surgical History:  Procedure Laterality Date  . CESAREAN SECTION    . COLPOSCOPY    . DILATION AND CURETTAGE OF UTERUS  2010  . WISDOM TOOTH EXTRACTION      Home Medications: Prior to Admission medications   None    Allergies: Allergies  Allergen Reactions  . Levaquin [Levofloxacin In D5w] Swelling    Lip swelling, hives, chest tightness    Social History:  reports that she has been smoking Cigarettes.  She has a 13.00 pack-year smoking history. She has never used smokeless tobacco. She reports that she drinks alcohol. She reports that she uses drugs, including Cocaine.   Family History: Family History  Problem Relation Age of Onset  . Hypertension Mother   . Seizures Mother   . Hypertension Maternal Grandmother   . Arthritis Maternal Grandmother   . Cancer Maternal Grandmother        breast  . Stroke Maternal Grandfather   . Seizures  Brother   . Mental illness Brother        paranoid schizophrenia  . Birth defects Daughter        hole in heart  . Seizures Maternal Aunt   . Seizures Brother   . Seizures Cousin   . Seizures Cousin   . Mental retardation Cousin     Review of Systems: Review of Systems  Constitutional: Positive for chills. Negative for fever.  HENT: Negative for hearing loss.   Eyes: Negative for blurred vision.  Respiratory: Negative for cough and shortness of breath.        +congestion  Cardiovascular: Negative for chest pain and leg swelling.  Gastrointestinal: Positive for abdominal pain, nausea and vomiting. Negative for constipation, diarrhea and heartburn.  Genitourinary: Positive for dysuria. Negative for hematuria.  Musculoskeletal: Negative for myalgias.  Skin: Negative for rash.  Neurological: Negative for dizziness.  Psychiatric/Behavioral: Negative for depression.  All other systems reviewed and are negative.   Physical Exam BP 125/80   Pulse 85   Temp 98.4 F (36.9 C) (Oral)   Ht 5' 3.5" (1.613 m)   Wt 63.5 kg (140 lb)   LMP 06/24/2016 (Exact Date)   SpO2 99%   BMI 24.41 kg/m  CONSTITUTIONAL: No acute distress HEENT:  Normocephalic, atraumatic, extraocular motion intact. NECK: Trachea is midline, and there  is no jugular venous distension.  RESPIRATORY:  Lungs are clear, and breath sounds are equal bilaterally. Normal respiratory effort without pathologic use of accessory muscles. CARDIOVASCULAR: Heart is regular without murmurs, gallops, or rubs. GI: The abdomen is soft, non-distended, with tenderness to palpation in periumbilical region as well as right lower quadrant. There were no palpable masses.  MUSCULOSKELETAL:  Normal muscle strength and tone in all four extremities.  No peripheral edema or cyanosis. SKIN: Skin turgor is normal. There are no pathologic skin lesions.  NEUROLOGIC:  Motor and sensation is grossly normal.  Cranial nerves are grossly intact. PSYCH:   Alert and oriented to person, place and time. Affect is normal.  Laboratory Analysis: Results for orders placed or performed during the hospital encounter of 07/11/16 (from the past 24 hour(s))  Lipase, blood     Status: None   Collection Time: 07/11/16  3:28 AM  Result Value Ref Range   Lipase 25 11 - 51 U/L  Comprehensive metabolic panel     Status: Abnormal   Collection Time: 07/11/16  3:28 AM  Result Value Ref Range   Sodium 138 135 - 145 mmol/L   Potassium 3.6 3.5 - 5.1 mmol/L   Chloride 108 101 - 111 mmol/L   CO2 22 22 - 32 mmol/L   Glucose, Bld 100 (H) 65 - 99 mg/dL   BUN 11 6 - 20 mg/dL   Creatinine, Ser 1.610.70 0.44 - 1.00 mg/dL   Calcium 9.1 8.9 - 09.610.3 mg/dL   Total Protein 7.3 6.5 - 8.1 g/dL   Albumin 4.6 3.5 - 5.0 g/dL   AST 19 15 - 41 U/L   ALT 23 14 - 54 U/L   Alkaline Phosphatase 67 38 - 126 U/L   Total Bilirubin 0.9 0.3 - 1.2 mg/dL   GFR calc non Af Amer >60 >60 mL/min   GFR calc Af Amer >60 >60 mL/min   Anion gap 8 5 - 15  CBC     Status: Abnormal   Collection Time: 07/11/16  3:28 AM  Result Value Ref Range   WBC 24.0 (H) 3.6 - 11.0 K/uL   RBC 4.27 3.80 - 5.20 MIL/uL   Hemoglobin 14.1 12.0 - 16.0 g/dL   HCT 04.540.1 40.935.0 - 81.147.0 %   MCV 94.0 80.0 - 100.0 fL   MCH 33.0 26.0 - 34.0 pg   MCHC 35.1 32.0 - 36.0 g/dL   RDW 91.414.2 78.211.5 - 95.614.5 %   Platelets 264 150 - 440 K/uL  Urinalysis, Complete w Microscopic     Status: Abnormal   Collection Time: 07/11/16  3:28 AM  Result Value Ref Range   Color, Urine YELLOW (A) YELLOW   APPearance HAZY (A) CLEAR   Specific Gravity, Urine 1.019 1.005 - 1.030   pH 5.0 5.0 - 8.0   Glucose, UA NEGATIVE NEGATIVE mg/dL   Hgb urine dipstick NEGATIVE NEGATIVE   Bilirubin Urine NEGATIVE NEGATIVE   Ketones, ur 20 (A) NEGATIVE mg/dL   Protein, ur NEGATIVE NEGATIVE mg/dL   Nitrite NEGATIVE NEGATIVE   Leukocytes, UA SMALL (A) NEGATIVE   RBC / HPF 0-5 0 - 5 RBC/hpf   WBC, UA 0-5 0 - 5 WBC/hpf   Bacteria, UA NONE SEEN NONE SEEN    Squamous Epithelial / LPF 6-30 (A) NONE SEEN   Mucous PRESENT   Troponin I     Status: None   Collection Time: 07/11/16  3:28 AM  Result Value Ref Range   Troponin I <0.03 <0.03  ng/mL  Urine Drug Screen, Qualitative ([facility] only)     Status: None   Collection Time: 07/11/16  3:28 AM  Result Value Ref Range   Tricyclic, Ur Screen NONE DETECTED NONE DETECTED   Amphetamines, Ur Screen NONE DETECTED NONE DETECTED   MDMA (Ecstasy)Ur Screen NONE DETECTED NONE DETECTED   Cocaine Metabolite,Ur [place] NONE DETECTED NONE DETECTED   Opiate, Ur Screen NONE DETECTED NONE DETECTED   Phencyclidine (PCP) Ur S NONE DETECTED NONE DETECTED   Cannabinoid 50 Ng, Ur [place] NONE DETECTED NONE DETECTED   Barbiturates, Ur Screen NONE DETECTED NONE DETECTED   Benzodiazepine, Ur Scrn NONE DETECTED NONE DETECTED   Methadone Scn, Ur NONE DETECTED NONE DETECTED  Pregnancy, urine POC     Status: None   Collection Time: 07/11/16  3:37 AM  Result Value Ref Range   Preg Test, Ur NEGATIVE NEGATIVE  Ethanol     Status: None   Collection Time: 07/11/16  4:49 AM  Result Value Ref Range   Alcohol, Ethyl (B) <5 <5 mg/dL  Wet prep, genital     Status: Abnormal   Collection Time: 07/11/16  5:21 AM  Result Value Ref Range   Yeast Wet Prep HPF POC NONE SEEN NONE SEEN   Trich, Wet Prep NONE SEEN NONE SEEN   Clue Cells Wet Prep HPF POC NONE SEEN NONE SEEN   WBC, Wet Prep HPF POC MANY (A) NONE SEEN   Sperm NONE SEEN     Imaging: Ct Abdomen Pelvis W Contrast  Result Date: 07/11/2016 CLINICAL DATA:  Acute onset of right-sided abdominal pain and vomiting. Initial encounter. EXAM: CT ABDOMEN AND PELVIS WITH CONTRAST TECHNIQUE: Multidetector CT imaging of the abdomen and pelvis was performed using the standard protocol following bolus administration of intravenous contrast. CONTRAST:  ISOVUE-300 IOPAMIDOL (ISOVUE-300) INJECTION 61% COMPARISON:  CT of the abdomen and pelvis from 06/07/2012, and pelvic ultrasound performed 10/11/2013  FINDINGS: Lower chest: The visualized lung bases are grossly clear. The visualized portions of the mediastinum are unremarkable. Hepatobiliary: The liver is unremarkable in appearance. The gallbladder is unremarkable in appearance. The common bile duct remains normal in caliber. Pancreas: The pancreas is within normal limits. Spleen: The spleen is unremarkable in appearance. Adrenals/Urinary Tract: The adrenal glands are unremarkable in appearance. Scattered bilateral renal cysts are seen. A 2 mm nonobstructing stone is noted at the interpole region of the right kidney. There is no evidence of hydronephrosis. No obstructing ureteral stones are identified. No perinephric stranding is seen. Stomach/Bowel: The appendix is dilated to 0.9 cm in diameter, with mild surrounding soft tissue inflammation, concerning for acute appendicitis. There is no evidence of perforation or abscess formation at this time. The colon is grossly unremarkable in appearance. The small bowel is grossly unremarkable. The stomach is within normal limits. Vascular/Lymphatic: The abdominal aorta is unremarkable in appearance. The inferior vena cava is grossly unremarkable. No retroperitoneal lymphadenopathy is seen. No pelvic sidewall lymphadenopathy is identified. Reproductive: The bladder is mildly distended and grossly unremarkable. The uterus is unremarkable in appearance. The ovaries are relatively symmetric. No suspicious adnexal masses are seen. Other: Soft tissue inflammation at the lower anterior abdominal wall likely reflects prior surgery. Musculoskeletal: No acute osseous abnormalities are identified. The visualized musculature is unremarkable in appearance. IMPRESSION: 1. Mild acute appendicitis, with dilatation of the appendix to 0.9 cm in diameter, and mild surrounding soft tissue inflammation. No evidence of perforation or abscess formation at this time. 2. 2 mm nonobstructing stone at the interpole region  of the right kidney.  Scattered bilateral renal cysts seen. These results were called by telephone at the time of interpretation on 07/11/2016 at 5:52 am to Dr. Daryel November, who verbally acknowledged these results. Electronically Signed   By: Roanna Raider M.D.   On: 07/11/2016 05:53    Assessment and Plan: This is a 36 y.o. female who presents with acute appendicitis.  I have independently viewed the patient's imaging studies as well as reviewed the patient's laboratory studies.  The patient's CT scan shows a dilated appendix with surrounding stranding but no evidence of perforation or abscess.  Her WBC however is elevated to 24.  Patient will be admitted to the general surgery service.  Discussed with patient that given her symptoms and findings, surgical management is recommended.  Discussed with her the procedure for laparoscopic appendectomy, including risks of bleeding, infection, injury to surrounding structures, and need for further procedures.  She understands and is willing to proceed.  She also understands that given the time of day, it would be my partner, Dr. Tonita Cong, performing the surgery today.  She has been given a dose of IV Zosyn and is NPO with IV fluid hydration.  She'll have appropriate pain/nausea control.  Patient understands this plan and all of her questions have been answered.   Howie Ill, MD [facility]

## 2016-07-12 LAB — BASIC METABOLIC PANEL
ANION GAP: 4 — AB (ref 5–15)
BUN: 9 mg/dL (ref 6–20)
CHLORIDE: 110 mmol/L (ref 101–111)
CO2: 26 mmol/L (ref 22–32)
Calcium: 9 mg/dL (ref 8.9–10.3)
Creatinine, Ser: 0.7 mg/dL (ref 0.44–1.00)
GFR calc Af Amer: >60 mL/min (ref >60)
GFR calc non Af Amer: >60 mL/min (ref >60)
GLUCOSE: 158 mg/dL — AB (ref 65–99)
POTASSIUM: 3.3 mmol/L — AB (ref 3.5–5.1)
Sodium: 140 mmol/L (ref 135–145)

## 2016-07-12 LAB — CBC
HCT: 37 % (ref 35.0–47.0)
HEMOGLOBIN: 12.6 g/dL (ref 12.0–16.0)
MCH: 32.7 pg (ref 26.0–34.0)
MCHC: 34 g/dL (ref 32.0–36.0)
MCV: 96 fL (ref 80.0–100.0)
Platelets: 245 10*3/uL (ref 150–440)
RBC: 3.85 MIL/uL (ref 3.80–5.20)
RDW: 14.5 % (ref 11.5–14.5)
WBC: 13.2 10*3/uL — AB (ref 3.6–11.0)

## 2016-07-12 MED ORDER — POTASSIUM CHLORIDE CRYS ER 20 MEQ PO TBCR: 40.0000 meq | EXTENDED_RELEASE_TABLET | Freq: Once | ORAL | Status: DC

## 2016-07-12 MED ORDER — PANTOPRAZOLE SODIUM 40 MG PO TBEC
40.0000 mg | DELAYED_RELEASE_TABLET | Freq: Every day | ORAL | Status: DC
  Administered 2016-07-12: 40 mg via ORAL
  Filled 2016-07-12: qty 1

## 2016-07-12 NOTE — Progress Notes (Signed)
1 Day Post-Op  Subjective: Patient feels better today and is tolerating a diet she has less pain. No nausea vomiting fevers or chills  Objective: Vital signs in last 24 hours: Temp:  [97.8 F (36.6 C)-99 F (37.2 C)] 98.1 F (36.7 C) (06/18 0410) Pulse Rate:  [61-94] 64 (06/18 0410) Resp:  [15-30] 20 (06/18 0410) BP: (97-135)/(47-81) 108/56 (06/18 0410) SpO2:  [98 %-100 %] 100 % (06/18 0410) Last BM Date: 07/10/16  Intake/Output from previous day: 06/17 0701 - 06/18 0700 In: 1013 [P.O.:138; I.V.:875] Out: 95 [Urine:90; Blood:5] Intake/Output this shift: Total I/O In: 240 [P.O.:240] Out: -   Physical exam:  No icterus no jaundice final signs reviewed. Abdomen is soft wounds are dressed nontender no erythema Nontender calves  Lab Results: CBC   Recent Labs  07/11/16 0328 07/12/16 0457  WBC 24.0* 13.2*  HGB 14.1 12.6  HCT 40.1 37.0  PLT 264 245   BMET  Recent Labs  07/11/16 0328 07/12/16 0457  NA 138 140  K 3.6 3.3*  CL 108 110  CO2 22 26  GLUCOSE 100* 158*  BUN 11 9  CREATININE 0.70 0.70  CALCIUM 9.1 9.0   PT/INR No results for input(s): LABPROT, INR in the last 72 hours. ABG No results for input(s): PHART, HCO3 in the last 72 hours.  Invalid input(s): PCO2, PO2  Studies/Results: Ct Abdomen Pelvis W Contrast  Result Date: 07/11/2016 CLINICAL DATA:  Acute onset of right-sided abdominal pain and vomiting. Initial encounter. EXAM: CT ABDOMEN AND PELVIS WITH CONTRAST TECHNIQUE: Multidetector CT imaging of the abdomen and pelvis was performed using the standard protocol following bolus administration of intravenous contrast. CONTRAST:  ISOVUE-300 IOPAMIDOL (ISOVUE-300) INJECTION 61% COMPARISON:  CT of the abdomen and pelvis from 06/07/2012, and pelvic ultrasound performed 10/11/2013 FINDINGS: Lower chest: The visualized lung bases are grossly clear. The visualized portions of the mediastinum are unremarkable. Hepatobiliary: The liver is unremarkable  in appearance. The gallbladder is unremarkable in appearance. The common bile duct remains normal in caliber. Pancreas: The pancreas is within normal limits. Spleen: The spleen is unremarkable in appearance. Adrenals/Urinary Tract: The adrenal glands are unremarkable in appearance. Scattered bilateral renal cysts are seen. A 2 mm nonobstructing stone is noted at the interpole region of the right kidney. There is no evidence of hydronephrosis. No obstructing ureteral stones are identified. No perinephric stranding is seen. Stomach/Bowel: The appendix is dilated to 0.9 cm in diameter, with mild surrounding soft tissue inflammation, concerning for acute appendicitis. There is no evidence of perforation or abscess formation at this time. The colon is grossly unremarkable in appearance. The small bowel is grossly unremarkable. The stomach is within normal limits. Vascular/Lymphatic: The abdominal aorta is unremarkable in appearance. The inferior vena cava is grossly unremarkable. No retroperitoneal lymphadenopathy is seen. No pelvic sidewall lymphadenopathy is identified. Reproductive: The bladder is mildly distended and grossly unremarkable. The uterus is unremarkable in appearance. The ovaries are relatively symmetric. No suspicious adnexal masses are seen. Other: Soft tissue inflammation at the lower anterior abdominal wall likely reflects prior surgery. Musculoskeletal: No acute osseous abnormalities are identified. The visualized musculature is unremarkable in appearance. IMPRESSION: 1. Mild acute appendicitis, with dilatation of the appendix to 0.9 cm in diameter, and mild surrounding soft tissue inflammation. No evidence of perforation or abscess formation at this time. 2. 2 mm nonobstructing stone at the interpole region of the right kidney. Scattered bilateral renal cysts seen. These results were called by telephone at the time of interpretation on  07/11/2016 at 5:52 am to Dr. Daryel NovemberJONATHAN WILLIAMS, who verbally  acknowledged these results. Electronically Signed   By: Roanna RaiderJeffery  Chang M.D.   On: 07/11/2016 05:53    Anti-infectives: Anti-infectives    Start     Dose/Rate Route Frequency Ordered Stop   07/11/16 1400  piperacillin-tazobactam (ZOSYN) IVPB 3.375 g     3.375 g 12.5 mL/hr over 240 Minutes Intravenous Every 8 hours 07/11/16 1116     07/11/16 0600  piperacillin-tazobactam (ZOSYN) IVPB 3.375 g     3.375 g 100 mL/hr over 30 Minutes Intravenous  Once 07/11/16 0554 07/11/16 0637      Assessment/Plan: s/p Procedure(s): APPENDECTOMY LAPAROSCOPIC   Vital signs are stable and afebrile. White blood cell count is decreased from 24-13,000. The visit this afternoon and consider discharge later today. Discussed with nursing.  Lattie Hawichard E Taeshawn Helfman, MD, FACS  07/12/2016

## 2016-07-12 NOTE — Progress Notes (Signed)
Patient has not eaten much lunch. She's had some nausea does not feel well enough to go home. We'll keep her another day on antibiotics.

## 2016-07-12 NOTE — Progress Notes (Signed)
PHARMACIST - PHYSICIAN COMMUNICATION  CONCERNING: IV to Oral Route Change Policy  RECOMMENDATION: This patient is receiving pantoprazole by the intravenous route.  Based on criteria approved by the Pharmacy and Therapeutics Committee, the intravenous medication(s) is/are being converted to the equivalent oral dose form(s).   DESCRIPTION: These criteria include:  The patient is eating (either orally or via tube) and/or has been taking other orally administered medications for a least 24 hours  The patient has no evidence of active gastrointestinal bleeding or impaired GI absorption (gastrectomy, short bowel, patient on TNA or NPO).  If you have questions about this conversion, please contact the Pharmacy Department  []   [phone]( [phone] )  [facility] [x]   [phone]( [phone] )  [facility] []   [phone]( [phone] )  [facility] []   [phone]( [phone] )  [facility] []   [phone]( [phone] )  [facility]   Marty HeckWang, Shantice Menger L, [facility] HospitalRPH 07/12/2016 7:40 AM

## 2016-07-13 ENCOUNTER — Telehealth: Payer: Self-pay

## 2016-07-13 LAB — CBC WITH DIFFERENTIAL/PLATELET
Basophils Absolute: 0 10*3/uL (ref 0–0.1)
Basophils Relative: 1 %
Eosinophils Absolute: 0.2 10*3/uL (ref 0–0.7)
Eosinophils Relative: 3 %
HEMATOCRIT: 35.9 % (ref 35.0–47.0)
Hemoglobin: 12.2 g/dL (ref 12.0–16.0)
LYMPHS ABS: 3.4 10*3/uL (ref 1.0–3.6)
LYMPHS PCT: 49 %
MCH: 33.3 pg (ref 26.0–34.0)
MCHC: 34.2 g/dL (ref 32.0–36.0)
MCV: 97.4 fL (ref 80.0–100.0)
MONO ABS: 0.5 10*3/uL (ref 0.2–0.9)
MONOS PCT: 7 %
NEUTROS ABS: 2.8 10*3/uL (ref 1.4–6.5)
Neutrophils Relative %: 40 %
Platelets: 221 10*3/uL (ref 150–440)
RBC: 3.68 MIL/uL — ABNORMAL LOW (ref 3.80–5.20)
RDW: 14.8 % — AB (ref 11.5–14.5)
WBC: 7 10*3/uL (ref 3.6–11.0)

## 2016-07-13 LAB — SURGICAL PATHOLOGY

## 2016-07-13 LAB — HIV ANTIBODY (ROUTINE TESTING W REFLEX): HIV Screen 4th Generation wRfx: NONREACTIVE

## 2016-07-13 MED ORDER — OXYCODONE-ACETAMINOPHEN 5-325 MG PO TABS: 1.0000 | ORAL_TABLET | ORAL | 0 refills | Status: DC | PRN

## 2016-07-13 NOTE — Progress Notes (Signed)
07/13/2016 12:30 PM  BP 112/68   Pulse (!) 52   Temp 97.7 F (36.5 C) (Oral)   Resp 17   Ht 5' 3.5" (1.613 m)   Wt 63.5 kg (140 lb)   LMP 06/24/2016 (Exact Date) Comment: preg neg  SpO2 99%   BMI 24.41 kg/m  Patient discharged per MD orders. Discharge instructions reviewed with patient and patient verbalized understanding. Lap site dressings dry and intact.  IV removed per policy. Prescriptions discussed and given to patient. Patient awaiting for ride to arrive for discharge.  Ron ParkerHerron, Lennox Dolberry D, RN

## 2016-07-13 NOTE — Progress Notes (Signed)
Patient requested work noted from MD. MD notified and Dr. Excell Seltzerooper stated that she could pick up work note from his office at a later date. Upon going into room to inform her of this, the patient was not in her room and all belongings were gone. Patient was seen walking into parking lot with female figure with all belongings in hand.

## 2016-07-13 NOTE — Discharge Instructions (Signed)
Remove dressing in 24 hours. °May shower in 24 hours. ° °Resume all home medications. °Follow-up with Dr. Terrye Dombrosky in 10 days. °

## 2016-07-13 NOTE — Telephone Encounter (Signed)
Patient called and is requesting a note for work. She had Laparoscopic Appendectomy 07/11/16 Dr.Cooper and just discharged today and [facility].  She stated she has been all over  the hospital trying to get a note.  She stated she had already spoke with nurses in his office and she was tired of repeating her self. I explained that this is Dr.Coopers office, not the hospital and we were not aware of her needing a note. I offered to write the note and she came to the office to pick it up. She was very rude during this time.  She was given the option of her returning to work due to how she feels and she kept stating she was not a doctor, she didn't do the surgery. I explained that she could go back as early as this Friday or next week, however she would have lifting restrictions of no more than 15 pounds and while taking narcotics she could not drive.  She is out of work until 07/29/16 and will be reevaluated 07/29/16.

## 2016-07-13 NOTE — Progress Notes (Signed)
2 Days Post-Op  Subjective: Patient feels better today is tolerating a regular diet but has not had a bowel movement. She has minimal abdominal pain.  Objective: Vital signs in last 24 hours: Temp:  [97.7 F (36.5 C)-98.5 F (36.9 C)] 97.7 F (36.5 C) (06/19 0557) Pulse Rate:  [52-71] 52 (06/19 0557) Resp:  [16-18] 17 (06/19 0557) BP: (94-115)/(53-68) 112/68 (06/19 0600) SpO2:  [99 %-100 %] 99 % (06/19 0557) Last BM Date: 07/10/16  Intake/Output from previous day: 06/18 0701 - 06/19 0700 In: 240 [P.O.:240] Out: 1 [Urine:1] Intake/Output this shift: No intake/output data recorded.  Physical exam:  Wounds are clean and dressed no erythema no drainage abdomen is soft nondistended nontender  Lab Results: CBC   Recent Labs  07/12/16 0457 07/13/16 0433  WBC 13.2* 7.0  HGB 12.6 12.2  HCT 37.0 35.9  PLT 245 221   BMET  Recent Labs  07/11/16 0328 07/12/16 0457  NA 138 140  K 3.6 3.3*  CL 108 110  CO2 22 26  GLUCOSE 100* 158*  BUN 11 9  CREATININE 0.70 0.70  CALCIUM 9.1 9.0   PT/INR No results for input(s): LABPROT, INR in the last 72 hours. ABG No results for input(s): PHART, HCO3 in the last 72 hours.  Invalid input(s): PCO2, PO2  Studies/Results: No results found.  Anti-infectives: Anti-infectives    Start     Dose/Rate Route Frequency Ordered Stop   07/11/16 1400  piperacillin-tazobactam (ZOSYN) IVPB 3.375 g     3.375 g 12.5 mL/hr over 240 Minutes Intravenous Every 8 hours 07/11/16 1116     07/11/16 0600  piperacillin-tazobactam (ZOSYN) IVPB 3.375 g     3.375 g 100 mL/hr over 30 Minutes Intravenous  Once 07/11/16 0554 07/11/16 0637      Assessment/Plan: s/p Procedure(s): APPENDECTOMY LAPAROSCOPIC   Blood cell count has normalized. Patient is doing well at this point tolerating a regular diet we'll discharge with instructions to follow-up next week and shower  Lattie Hawichard E Valeri Sula, MD, FACS  07/13/2016

## 2016-07-13 NOTE — Discharge Summary (Signed)
Physician Discharge Summary  Patient ID: Erica [name] MRN: [id number] DOB/AGE: August 20, 1980 36 y.o.  Admit date: 07/11/2016 Discharge date: 07/13/2016   Discharge Diagnoses:  Active Problems:   Acute appendicitis   Procedures:Laparoscopic appendectomy  Hospital Course: This patient underwent a laparoscopic appendectomy for nonruptured appendicitis. Her white blood cell count remained elevated after surgery but has now normalized. She is tolerating a regular diet and ready to go home. She's had not had a bowel movement but is instructed concerning the use of suppositories at home if necessary. She will follow-up in our office in 10 days she may remove her dressings and shower tomorrow.  Consults: None  Disposition: 01-Home or Self Care   Allergies as of 07/13/2016      Reactions   Levaquin [levofloxacin In D5w] Swelling   Lip swelling, hives, chest tightness      Medication List    TAKE these medications   oxyCODONE-acetaminophen 5-325 MG tablet Commonly known as:  ROXICET Take 1 tablet by mouth every 4 (four) hours as needed for moderate pain.      Follow-up Information    Lattie Hawooper, Lya Holben E, MD Follow up in 2 week(s).   Specialty:  Surgery Contact information: [address] [place] [id number] [phone]           Lattie Hawichard E Yarelli Decelles, MD, FACS

## 2016-07-14 ENCOUNTER — Telehealth: Payer: Self-pay

## 2016-07-14 NOTE — Telephone Encounter (Signed)
Post-op call made to patient at this time. Left message for patient to call back with any questions or concerns. Reminded of her follow up appointment.

## 2016-07-14 NOTE — Telephone Encounter (Signed)
Patient called in at this time stating she has not had a bowel movement since before surgery on 07/11/16. She has been using Narcotics and we discussed these medications are known for causing constipation. Patient stated she has tried miralax and Milk of Magnesium with no relief.  She also stated she had passed a little blood with urination. We discussed that she had a catheter and it could have caused some irritation  in her Urethra and for her to drink plenty of fluids. She was instructed to drink Magnesium Citrate for constipation.  We discussed signs and symptoms of infection and for her to call our office if she experiences any of them.  Patient verbalized understanding.

## 2016-07-14 NOTE — Telephone Encounter (Signed)
Patient called in with concerns that she is bleeding. She does not know whether it is from her urethra or her vagina but she states that it is bright red. She does not know if it is time for her period. She denies frequency, urgency, or dysuria at this time. Bleeding is continuous and is small-moderate amount per patient. She is not wearing a panty liner at this time.  I explained to patient that she can put in a tampon and if the bleeding stops, then it is vaginal bleeding; if this does not stop- she is to call our office back as we may need to send her for a UA. She verbalizes understanding of this.

## 2016-07-21 ENCOUNTER — Encounter (HOSPITAL_COMMUNITY): Payer: Self-pay | Admitting: *Deleted

## 2016-07-21 ENCOUNTER — Ambulatory Visit (HOSPITAL_COMMUNITY)
Admission: EM | Admit: 2016-07-21 | Discharge: 2016-07-21 | Disposition: A | Discharge status: Home / Self Care | Payer: Medicaid Other | Attending: Family Medicine | Admitting: Family Medicine

## 2016-07-21 DIAGNOSIS — B850 Pediculosis due to Pediculus humanus capitis: Secondary | ICD-10-CM

## 2016-07-21 MED ORDER — PERMETHRIN 1 % EX LOTN: TOPICAL_LOTION | CUTANEOUS | 1 refills | Status: DC

## 2016-07-21 NOTE — ED Triage Notes (Signed)
Pt  Has   Symptoms  Of   Head   Lice  For  10  Days    Also  Wants  Here   appendex     Suture  Line  Checked   While  She  Is  Here   She  Is  In  No  Distress

## 2016-07-21 NOTE — ED Provider Notes (Signed)
  [facility]   [id number] 07/21/16 Arrival Time: 1514  ASSESSMENT & PLAN:  1. Head lice     Meds ordered this encounter  Medications  . permethrin (ELIMITE) 1 % lotion    Sig: Shampoo, rinse and towel dry hair, saturate hair and scalp. Rinse after 10 min; repeat in 1 week if needed.    Dispense:  59 mL    Refill:  1    Reviewed expectations re: course of current medical issues. Questions answered. May f/u with PCP or here as needed. Patient verbalized understanding. After Visit Summary given.   SUBJECTIVE:  Erica [name] is a 36 y.o. female who presents with complaint of head lice. Other family members have. Some itching. Otherwise well. No skin lesions or rashes reported. No self treatment.  ROS: As per HPI.   OBJECTIVE:  Vitals:   07/21/16 1531  BP: 114/72  Pulse: 78  Resp: 18  Temp: 98.6 F (37 C)  TempSrc: Oral  SpO2: 100%     General appearance: alert, cooperative, appears stated age and no distress Head: normocephalic; atraumatic Many nits seen throughout hair Skin: warm and dry; no rashes or lesions  Labs Reviewed - No data to display  No results found.  Allergies  Allergen Reactions  . Levaquin [Levofloxacin In D5w] Swelling    Lip swelling, hives, chest tightness    PMHx, SurgHx, SocialHx, Medications, and Allergies were reviewed in the Visit Navigator and updated as appropriate.       Mardella LaymanHagler, Haydon Kalmar, MD 07/21/16 [phone]

## 2016-07-29 ENCOUNTER — Ambulatory Visit (INDEPENDENT_AMBULATORY_CARE_PROVIDER_SITE_OTHER): Payer: Self-pay | Admitting: General Surgery

## 2016-07-29 ENCOUNTER — Encounter: Payer: Self-pay | Admitting: General Surgery

## 2016-07-29 VITALS — BP 114/74 | HR 74 | Temp 98.9°F | Ht 63.0 in | Wt 164.4 lb

## 2016-07-29 DIAGNOSIS — Z4889 Encounter for other specified surgical aftercare: Secondary | ICD-10-CM

## 2016-07-29 NOTE — Patient Instructions (Signed)
Please call our office with any questions or concerns.  Please do not submerge in a tub, hot tub, or pool until incisions are completely sealed.  Use sun block to incision area over the next year if this area will be exposed to sun. This helps decrease scarring.  You may resume your normal activities on 7/30. At that time- Listen to your body when lifting, if you have pain when lifting, stop and then try again in a few days. Pain after doing exercises or activities of daily living is normal as you get back in to your normal routine.  If you develop redness, drainage, or pain at incision sites- call our office immediately and speak with a nurse.

## 2016-07-29 NOTE — Progress Notes (Signed)
Outpatient Surgical Follow Up  07/29/2016  Erica [name] is an 36 y.o. female.   Chief Complaint  Patient presents with  . Routine Post Op    Laparoscopic appendectomy--Lesleigh Hughson 07/11/16    HPI: 36 year old female returns to clinic for follow-up 3 weeks status post laparoscopic appendectomy. She states she had constipation may lead to surgery but this is resolved. She continues to have postprandial bloating but denies any fevers, chills, nausea, vomiting, chest pain, shortness of breath. She has used all of her pain medication and states she continues to be sore at the end of the day but denies any pain during activities. She reports that she return to work one week after surgery prior to being seen in follow-up.  Past Medical History:  Diagnosis Date  . Abnormal Pap smear   . Anxiety    as teen  . Anxiety   . Depression    as teen  . GERD (gastroesophageal reflux disease)   . Headache(784.0)   . No pertinent past medical history   . VBAC, delivered, current hospitalization 05/19/2011    Past Surgical History:  Procedure Laterality Date  . CESAREAN SECTION    . COLPOSCOPY    . DILATION AND CURETTAGE OF UTERUS  2010  . LAPAROSCOPIC APPENDECTOMY N/A 07/11/2016   Procedure: APPENDECTOMY LAPAROSCOPIC;  Surgeon: Erica Frame, MD;  Location: [facility] ORS;  Service: General;  Laterality: N/A;  . WISDOM TOOTH EXTRACTION      Family History  Problem Relation Age of Onset  . Hypertension Mother   . Seizures Mother   . Hypertension Maternal Grandmother   . Arthritis Maternal Grandmother   . Cancer Maternal Grandmother        breast  . Stroke Maternal Grandfather   . Seizures Brother   . Mental illness Brother        paranoid schizophrenia  . Birth defects Daughter        hole in heart  . Seizures Maternal Aunt   . Seizures Brother   . Seizures Cousin   . Seizures Cousin   . Mental retardation Cousin     Social History:  reports that she has been smoking Cigarettes.  She has a  13.00 pack-year smoking history. She has never used smokeless tobacco. She reports that she drinks alcohol. She reports that she uses drugs, including Cocaine.  Allergies:  Allergies  Allergen Reactions  . Levaquin [Levofloxacin In D5w] Swelling    Lip swelling, hives, chest tightness    Medications reviewed.    ROS A multipoint review of systems was completed, all pertinent positives and negatives are documented within the history of present illness and remainder are negative   BP 114/74   Pulse 74   Temp 98.9 F (37.2 C) (Oral)   Ht 5\' 3"  (1.6 m)   Wt 74.6 kg (164 lb 6.4 oz)   LMP 07/18/2016   BMI 29.12 kg/m   Physical Exam  Gen.: No acute distress Chest: Clear to auscultation Heart: Regular rhythm Abdomen: Soft, nontender, nondistended. Well approximated laparoscopic incision sites that are almost completely healed. No evidence of erythema, drainage.   No results found for this or any previous visit (from the past 48 hour(s)). No results found.  Assessment/Plan:  1. Aftercare following surgery 36 year old female status post laparoscopic appendectomy. Pathology reviewed with the patient. Discussed anticipated recovery and signs and symptoms of infection. She will return to clinic for any signs of infection. She'll follow up on an as-needed basis. Standard postoperative precautions  provided.     Erica Frameharles Yaqub Arney, MD [facility] General Surgeon  07/29/2016,2:36 PM

## 2016-08-16 ENCOUNTER — Ambulatory Visit (HOSPITAL_COMMUNITY)
Admission: EM | Admit: 2016-08-16 | Discharge: 2016-08-16 | Disposition: A | Discharge status: Home / Self Care | Payer: Medicaid Other | Attending: Family Medicine | Admitting: Family Medicine

## 2016-08-16 ENCOUNTER — Encounter (HOSPITAL_COMMUNITY): Payer: Self-pay | Admitting: Emergency Medicine

## 2016-08-16 DIAGNOSIS — R05 Cough: Secondary | ICD-10-CM

## 2016-08-16 DIAGNOSIS — R059 Cough, unspecified: Secondary | ICD-10-CM

## 2016-08-16 DIAGNOSIS — J209 Acute bronchitis, unspecified: Secondary | ICD-10-CM | POA: Diagnosis not present

## 2016-08-16 MED ORDER — PREDNISONE 10 MG PO TABS: 20.0000 mg | ORAL_TABLET | Freq: Every day | ORAL | 0 refills | Status: DC

## 2016-08-16 MED ORDER — AZITHROMYCIN 250 MG PO TABS: 250.0000 mg | ORAL_TABLET | Freq: Every day | ORAL | 0 refills | Status: DC

## 2016-08-16 MED ORDER — BENZONATATE 100 MG PO CAPS: 200.0000 mg | ORAL_CAPSULE | Freq: Three times a day (TID) | ORAL | 0 refills | Status: DC | PRN

## 2016-08-16 NOTE — ED Triage Notes (Signed)
Cough that started four days ago, has had stuffiness in nose

## 2016-08-16 NOTE — ED Provider Notes (Signed)
[name]: [id number]     Arrival date & time 08/16/16  1623 History   None    Chief Complaint  Patient presents with  . Cough   (Consider location/radiation/quality/duration/timing/severity/associated sxs/prior Treatment) Patient c/o cough and uri sx's for over a week and she is a cigarette smoker.   The history is provided by the patient.  Cough  Cough characteristics:  Productive Sputum characteristics:  White Severity:  Moderate Onset quality:  Sudden Duration:  1 week Timing:  Constant Chronicity:  New Smoker: yes   Context: upper respiratory infection   Relieved by:  Nothing Worsened by:  Nothing Ineffective treatments:  None tried   Past Medical History:  Diagnosis Date  . Abnormal Pap smear   . Anxiety    as teen  . Anxiety   . Depression    as teen  . GERD (gastroesophageal reflux disease)   . Headache(784.0)   . No pertinent past medical history   . VBAC, delivered, current hospitalization 05/19/2011   Past Surgical History:  Procedure Laterality Date  . CESAREAN SECTION    . COLPOSCOPY    . DILATION AND CURETTAGE OF UTERUS  2010  . LAPAROSCOPIC APPENDECTOMY N/A 07/11/2016   Procedure: APPENDECTOMY LAPAROSCOPIC;  Surgeon: Ricarda Frame, MD;  Location: [facility] ORS;  Service: General;  Laterality: N/A;  . WISDOM TOOTH EXTRACTION     Family History  Problem Relation Age of Onset  . Hypertension Mother   . Seizures Mother   . Hypertension Maternal Grandmother   . Arthritis Maternal Grandmother   . Cancer Maternal Grandmother        breast  . Stroke Maternal Grandfather   . Seizures Brother   . Mental illness Brother        paranoid schizophrenia  . Birth defects Daughter        hole in heart  . Seizures Maternal Aunt   . Seizures Brother   . Seizures Cousin   . Seizures Cousin   . Mental retardation Cousin    Social History  Substance Use Topics  . Smoking status: Current Every Day Smoker    Packs/day: 1.00    Years: 13.00    Types: Cigarettes   . Smokeless tobacco: Never Used  . Alcohol use Yes     Comment: rare   OB History    Gravida Para Term Preterm AB Living   7 6 4 2 1 6    SAB TAB Ectopic Multiple Live Births   1 0 0 0 6     Review of Systems  Constitutional: Negative.   HENT: Positive for congestion.   Eyes: Negative.   Respiratory: Positive for cough.   Cardiovascular: Negative.   Gastrointestinal: Negative.   Endocrine: Negative.   Genitourinary: Negative.   Musculoskeletal: Negative.   Allergic/Immunologic: Negative.   Neurological: Negative.   Hematological: Negative.   Psychiatric/Behavioral: Negative.     Allergies  Levaquin [levofloxacin in d5w]  Home Medications   Prior to Admission medications   Medication Sig Start Date End Date Taking? Authorizing Provider  azithromycin (ZITHROMAX) 250 MG tablet Take 1 tablet (250 mg total) by mouth daily. Take first 2 tablets together, then 1 every day until finished. 08/16/16   Deatra Canter, FNP  benzonatate (TESSALON) 100 MG capsule Take 2 capsules (200 mg total) by mouth 3 (three) times daily as needed for cough. 08/16/16   Deatra Canter, FNP  oxyCODONE-acetaminophen (ROXICET) 5-325 MG tablet Take 1 tablet by mouth every 4 (four) hours as  needed for moderate pain. Patient not taking: Reported on 07/29/2016 07/13/16   Lattie Hawooper, Richard E, MD  predniSONE (DELTASONE) 10 MG tablet Take 2 tablets (20 mg total) by mouth daily. 08/16/16   Deatra Canterxford, Krisandra Bueno J, FNP   Meds Ordered and Administered this Visit  Medications - No data to display  BP 123/70 (BP Location: Left Arm)   Pulse 65   Temp 98.3 F (36.8 C) (Oral)   Resp 18   LMP 08/11/2016   SpO2 100%  No data found.   Physical Exam  Constitutional: She appears well-developed and well-nourished.  HENT:  Head: Normocephalic and atraumatic.  Right Ear: External ear normal.  Left Ear: External ear normal.  Mouth/Throat: Oropharynx is clear and moist.  Eyes: Pupils are equal, round, and reactive to  light. Conjunctivae and EOM are normal.  Neck: Normal range of motion. Neck supple.  Cardiovascular: Normal rate, regular rhythm and normal heart sounds.   Pulmonary/Chest: Effort normal and breath sounds normal.  Abdominal: Soft. Bowel sounds are normal.  Nursing note and vitals reviewed.   Urgent Care Course     Procedures (including critical care time)  Labs Review Labs Reviewed - No data to display  Imaging Review No results found.   Visual Acuity Review  Right Eye Distance:   Left Eye Distance:   Bilateral Distance:    Right Eye Near:   Left Eye Near:    Bilateral Near:         MDM   1. Acute bronchitis, unspecified organism   2. Cough    Zpak as directed Tessalon Perles Prednisone 10 mg 2 po qd x 5 days #10    Deatra CanterOxford, Ahlayah Tarkowski J, [place]NP 08/16/16 [phone]

## 2016-08-16 NOTE — ED Notes (Signed)
Patient cursing as she was leaving in reference to potential cost of antibiotic.  Provided discount drug cards.

## 2016-09-10 ENCOUNTER — Encounter (HOSPITAL_COMMUNITY): Payer: Self-pay | Admitting: Emergency Medicine

## 2016-09-10 ENCOUNTER — Ambulatory Visit (HOSPITAL_COMMUNITY)
Admission: EM | Admit: 2016-09-10 | Discharge: 2016-09-10 | Disposition: A | Discharge status: Home / Self Care | Payer: Self-pay | Attending: Family Medicine | Admitting: Family Medicine

## 2016-09-10 DIAGNOSIS — K047 Periapical abscess without sinus: Secondary | ICD-10-CM

## 2016-09-10 MED ORDER — OXYCODONE-ACETAMINOPHEN 5-325 MG PO TABS: 1.0000 | ORAL_TABLET | ORAL | 0 refills | Status: DC | PRN

## 2016-09-10 MED ORDER — PENICILLIN V POTASSIUM 500 MG PO TABS: 500.0000 mg | ORAL_TABLET | Freq: Four times a day (QID) | ORAL | 0 refills | Status: AC

## 2016-09-10 NOTE — ED Triage Notes (Signed)
PT has right lower dental pain for four days.

## 2016-09-10 NOTE — Discharge Instructions (Signed)
For your dental abscess, you will need to see a dentist, otherwise your pain will recur. For infection, I prescribed penicillin, one tablet every 6 hours. I have also prescribed a medicine for pain called Percocet, this medicine is a narcotic, it will cause drowsiness, and it is addictive. Do not take more than what is necessary, do not drink alcohol while taking, and do not operate any heavy machinery while taking this medicine.

## 2016-09-10 NOTE — ED Provider Notes (Signed)
  [facility]   [id number] 09/10/16 Arrival Time: 1430   SUBJECTIVE:  Erica [name] is a 36 y.o. female who presents to the urgent care  with complaint of dental pain. States she has only abscess to her right lower jaw that is been present for 4 days. Pain is worsened with hot and cold fluids, pressure, and touch. She has no trismus, no difficulty swallowing, no fever, and no chills. Does have history of poor dentition  ROS: As per HPI, remainder of ROS negative.   OBJECTIVE:  Vitals:   09/10/16 1504 09/10/16 1505  BP: 111/71   Pulse: 78   Resp: 16   Temp: 98.6 F (37 C)   TempSrc: Oral   SpO2: 98%   Weight:  150 lb (68 kg)  Height:  5\' 3"  (1.6 m)    General appearance: alert; no distress HEENT: normocephalic; atraumatic; conjunctivae normal; 29th tooth is chipped, area of erythema and swelling at the base of the tooth consistent with abscess, has tender submandibular lymphadenopathy, tonsillar lymphadenopathy, no trismus is observed, Neck: Trachea midline no cervical lymphadenopathy Lungs: clear to auscultation bilaterally Heart: regular rate and rhythm Abdomen: soft, non-tender;  Musculoskeletal/extremities: Pulses +2 symmetric, grossly symmetric without deformity Skin: warm and dry Neurologic: Grossly normal Psychological:  alert and cooperative; normal mood and affect     ASSESSMENT & PLAN:  1. Dental abscess     Meds ordered this encounter  Medications  . penicillin v potassium (VEETID) 500 MG tablet    Sig: Take 1 tablet (500 mg total) by mouth 4 (four) times daily.    Dispense:  40 tablet    Refill:  0    Order Specific Question:   Supervising Provider    Answer:   Elvina Sidle [[id number]]  . oxyCODONE-acetaminophen (PERCOCET/ROXICET) 5-325 MG tablet    Sig: Take 1-2 tablets by mouth every 4 (four) hours as needed for severe pain.    Dispense:  20 tablet    Refill:  0    Order Specific Question:   Supervising Provider    Answer:   Lonia Blood    [facility] controlled substances reporting system consulted prior to issuing prescription, with the following findings below:  She has had 1 prescription for Percocet that was issued in June 2018 for pain secondary to surgery. There were no other controlled substances issued in the last year  Reviewed expectations re: course of current medical issues. Questions answered. Outlined signs and symptoms indicating need for more acute intervention. Patient verbalized understanding. After Visit Summary given.    Procedures:    Labs Reviewed - No data to display  No results found.  Allergies  Allergen Reactions  . Levaquin [Levofloxacin In D5w] Swelling    Lip swelling, hives, chest tightness    PMHx, SurgHx, SocialHx, Medications, and Allergies were reviewed in the Visit Navigator and updated as appropriate.       Dorena Bodo, NP 09/10/16 [phone]

## 2016-10-27 ENCOUNTER — Ambulatory Visit (HOSPITAL_COMMUNITY)
Admission: EM | Admit: 2016-10-27 | Discharge: 2016-10-27 | Disposition: A | Discharge status: Home / Self Care | Payer: Self-pay | Attending: Family Medicine | Admitting: Family Medicine

## 2016-10-27 ENCOUNTER — Encounter (HOSPITAL_COMMUNITY): Payer: Self-pay | Admitting: Emergency Medicine

## 2016-10-27 DIAGNOSIS — N898 Other specified noninflammatory disorders of vagina: Secondary | ICD-10-CM | POA: Insufficient documentation

## 2016-10-27 DIAGNOSIS — F17210 Nicotine dependence, cigarettes, uncomplicated: Secondary | ICD-10-CM | POA: Insufficient documentation

## 2016-10-27 DIAGNOSIS — Z3202 Encounter for pregnancy test, result negative: Secondary | ICD-10-CM

## 2016-10-27 DIAGNOSIS — R300 Dysuria: Secondary | ICD-10-CM

## 2016-10-27 DIAGNOSIS — N3001 Acute cystitis with hematuria: Secondary | ICD-10-CM | POA: Insufficient documentation

## 2016-10-27 LAB — POCT URINALYSIS DIP (DEVICE)
GLUCOSE, UA: NEGATIVE mg/dL
NITRITE: POSITIVE — AB
Specific Gravity, Urine: >=1.03 (ref 1.005–1.030)
Urobilinogen, UA: 1 mg/dL (ref 0.0–1.0)
pH: 5.5 (ref 5.0–8.0)

## 2016-10-27 LAB — POCT PREGNANCY, URINE: PREG TEST UR: NEGATIVE

## 2016-10-27 MED ORDER — CEFTRIAXONE SODIUM 250 MG IJ SOLR
250.0000 mg | Freq: Once | INTRAMUSCULAR | Status: AC
  Administered 2016-10-27: 250 mg via INTRAMUSCULAR

## 2016-10-27 MED ORDER — STERILE WATER FOR INJECTION IJ SOLN
INTRAMUSCULAR | Status: AC
  Filled 2016-10-27: qty 10

## 2016-10-27 MED ORDER — AZITHROMYCIN 250 MG PO TABS
1000.0000 mg | ORAL_TABLET | Freq: Once | ORAL | Status: AC
  Administered 2016-10-27: 1000 mg via ORAL

## 2016-10-27 MED ORDER — CEFTRIAXONE SODIUM 250 MG IJ SOLR
INTRAMUSCULAR | Status: AC
  Filled 2016-10-27: qty 250

## 2016-10-27 MED ORDER — METRONIDAZOLE 500 MG PO TABS: 500.0000 mg | ORAL_TABLET | Freq: Two times a day (BID) | ORAL | 0 refills | Status: DC

## 2016-10-27 MED ORDER — AZITHROMYCIN 250 MG PO TABS
ORAL_TABLET | ORAL | Status: AC
  Filled 2016-10-27: qty 4

## 2016-10-27 MED ORDER — CEPHALEXIN 500 MG PO CAPS: 500.0000 mg | ORAL_CAPSULE | Freq: Two times a day (BID) | ORAL | 0 refills | Status: AC

## 2016-10-27 MED ORDER — FLUCONAZOLE 150 MG PO TABS: 150.0000 mg | ORAL_TABLET | Freq: Every day | ORAL | 0 refills | Status: DC

## 2016-10-27 NOTE — ED Triage Notes (Addendum)
PT reports urinary burning, frequency, and blood in urine for 3-7 days. Does not drink enough water per PT. PT also reports vaginal discharge with odor and pelvic pressure. PT would like STD testing, but does not want this discussed with her partner in the room. Dr. Tracie Harrier and Sharyl Nimrod RN made aware of request.

## 2016-10-27 NOTE — Discharge Instructions (Signed)
Your urine was positive for an urinary tract infection. Start Keflex as directed. Diflucan and flagyl for vaginal discharge. Keep hydrated, your urine should be clear to pale yellow in color. Testing sent, you will be contacted with any positive results that requires further treatment. Refrain from sexual activity for the next 7 days. Monitor for any worsening of symptoms, fever, abdominal pain, nausea, vomiting, to follow up for reevaluation.

## 2016-10-27 NOTE — ED Provider Notes (Signed)
[facility]    [name]: [id number] Arrival date & time: 10/27/16  1501     History   Chief Complaint Chief Complaint  Patient presents with  . Urinary Tract Infection    HPI Erica [name] is a 36 y.o. female.   36 year old female comes in for few day history of urinary frequency, dysuria, hematuria. She states she does not drink enough water and thinks that is the cause of her symptoms. She reports vaginal discharge with an odor with suprapubic pressure. Denies abdominal pain, nausea, vomiting, diarrhea, constipation. Patient would like STD testing and treatment, but does not want to discuss with partner in the room. Denies fever, chills, night sweats. LMP 10/12/2016.       Past Medical History:  Diagnosis Date  . Abnormal Pap smear   . Anxiety    as teen  . Anxiety   . Depression    as teen  . GERD (gastroesophageal reflux disease)   . Headache(784.0)   . No pertinent past medical history   . VBAC, delivered, current hospitalization 05/19/2011    Patient Active Problem List   Diagnosis Date Noted  . Acute appendicitis 07/11/2016  . URI (upper respiratory infection) 09/23/2010  . Right otitis media 09/23/2010  . Positive pregnancy test 09/23/2010    Past Surgical History:  Procedure Laterality Date  . APPENDECTOMY    . CESAREAN SECTION    . COLPOSCOPY    . DILATION AND CURETTAGE OF UTERUS  2010  . LAPAROSCOPIC APPENDECTOMY N/A 07/11/2016   Procedure: APPENDECTOMY LAPAROSCOPIC;  Surgeon: Ricarda Frame, MD;  Location: [facility] OR[facility];  Service: General;  Laterality: N/A;  . WISDOM TOOTH EXTRACTION      OB History    Gravida Para Term Preterm AB Living   SAB TAB Ectopic Multiple Live Births   1 0 0 0 6       Home Medications    Prior to Admission medications   Medication Sig Start Date End Date Taking? Authorizing Provider  azithromycin (ZITHROMAX) 250 MG tablet Take 1 tablet (250 mg total) by mouth daily. Take first 2 tablets together,  then 1 every day until finished. 08/16/16   Deatra Canter, FNP  cephALEXin (KEFLEX) 500 MG capsule Take 1 capsule (500 mg total) by mouth 2 (two) times daily. 10/27/16 11/03/16  Cathie Hoops, Amy V, PA-C  fluconazole (DIFLUCAN) 150 MG tablet Take 1 tablet (150 mg total) by mouth daily. Take second dose 72 hours later if symptoms still persists. 10/27/16   Cathie Hoops, Amy V, PA-C  metroNIDAZOLE (FLAGYL) 500 MG tablet Take 1 tablet (500 mg total) by mouth 2 (two) times daily. 10/27/16   Cathie Hoops, Amy V, PA-C  oxyCODONE-acetaminophen (PERCOCET/ROXICET) 5-325 MG tablet Take 1-2 tablets by mouth every 4 (four) hours as needed for severe pain. 09/10/16   Dorena Bodo, NP    Family History Family History  Problem Relation Age of Onset  . Hypertension Mother   . Seizures Mother   . Hypertension Maternal Grandmother   . Arthritis Maternal Grandmother   . Cancer Maternal Grandmother        breast  . Stroke Maternal Grandfather   . Seizures Brother   . Mental illness Brother        paranoid schizophrenia  . Birth defects Daughter        hole in heart  . Seizures Maternal Aunt   . Seizures Brother   . Seizures Cousin   .  Seizures Cousin   . Mental retardation Cousin     Social History Social History  Substance Use Topics  . Smoking status: Current Every Day Smoker    Packs/day: 1.00    Years: 13.00    Types: Cigarettes  . Smokeless tobacco: Never Used  . Alcohol use Yes     Comment: rare     Allergies   Levaquin [levofloxacin in d5w]   Review of Systems Review of Systems  Reason unable to perform ROS: See HPI as above.     Physical Exam Triage Vital Signs ED Triage Vitals  Enc Vitals Group     BP 10/27/16 1534 136/83     Pulse Rate 10/27/16 1534 100     Resp 10/27/16 1534 16     Temp 10/27/16 1534 98.7 F (37.1 C)     Temp Source 10/27/16 1534 Oral     SpO2 10/27/16 1534 98 %     Weight 10/27/16 1535 147 lb 9.6 oz (67 kg)     Height 10/27/16 1535  (1.6 m)     Head Circumference  --      Peak Flow --      Pain Score --      Pain Loc --      Pain Edu? --      Excl. in GC? --    No data found.   Updated Vital Signs BP 136/83   Pulse 100   Temp 98.7 F (37.1 C) (Oral)   Resp 16   Ht  (1.6 m)   Wt 147 lb 9.6 oz (67 kg)   LMP 10/12/2016   SpO2 98%   BMI 26.15 kg/m   Physical Exam  Constitutional: She is oriented to person, place, and time. She appears well-developed and well-nourished. No distress.  HENT:  Head: Normocephalic and atraumatic.  Eyes: Pupils are equal, round, and reactive to light. Conjunctivae are normal.  Neck: Normal range of motion. Neck supple.  Cardiovascular: Normal rate, regular rhythm and normal heart sounds.  Exam reveals no gallop and no friction rub.   No murmur heard. Pulmonary/Chest: Effort normal and breath sounds normal. She has no wheezes. She has no rales.  Abdominal: Soft. Bowel sounds are normal. She exhibits no mass. There is tenderness (suprapubic). There is no rebound, no guarding and no CVA tenderness.  Genitourinary: There is no rash or tenderness on the right labia. There is no rash or tenderness on the left labia. Uterus is tender. Cervix exhibits discharge. Cervix exhibits no motion tenderness and no friability. Right adnexum displays no mass and no tenderness. Left adnexum displays no mass and no tenderness. Vaginal discharge found.  Genitourinary Comments: Chaperone present, asked partner to leave during exam.  Lymphadenopathy:    She has no cervical adenopathy.  Neurological: She is alert and oriented to person, place, and time.  Skin: Skin is warm and dry.  Psychiatric: She has a normal mood and affect. Her behavior is normal. Judgment normal.     UC Treatments / Results  Labs (all labs ordered are listed, but only abnormal results are displayed) Labs Reviewed  POCT URINALYSIS DIP (DEVICE) - Abnormal; Notable for the following:       Result Value   Bilirubin Urine SMALL (*)    Ketones, ur TRACE  (*)    Hgb urine dipstick LARGE (*)    Protein, ur >=300 (*)    Nitrite POSITIVE (*)    Leukocytes, UA SMALL (*)  All other components within normal limits  URINE CULTURE  POCT PREGNANCY, URINE  CERVICOVAGINAL ANCILLARY ONLY    EKG  EKG Interpretation None       Radiology No results found.  Procedures Procedures (including critical care time)  Medications Ordered in UC Medications  azithromycin (ZITHROMAX) tablet 1,000 mg (1,000 mg Oral Given 10/27/16 1630)  cefTRIAXone (ROCEPHIN) injection 250 mg (250 mg Intramuscular Given 10/27/16 1630)     Initial Impression / Assessment and Plan / UC Course  I have reviewed the triage vital signs and the nursing notes.  Pertinent labs & imaging results that were available during my care of the patient were reviewed by me and considered in my medical decision making (see chart for details).    Urine dipstick positive for UTI. Start Keflex as directed. Push fluids. Discussed possibility of PID given tenderness of suprapubic region, however, given no cervical motion tenderness, will monitor for now. Patient would like to cover for STDs. Azithromycin and rocephin given in office. Rx of flagyl and diflucan called in. Cytology sent, patient will be contacted with any positive results that require additional treatment. Patient to refrain from sexual activity for the next 7 days. Monitor for worsening of symptoms, follow up for reevaluation.    Final Clinical Impressions(s) / UC Diagnoses   Final diagnoses:  Acute cystitis with hematuria  Vaginal discharge    New Prescriptions Discharge Medication List as of 10/27/2016  4:16 PM    START taking these medications   Details  cephALEXin (KEFLEX) 500 MG capsule Take 1 capsule (500 mg total) by mouth 2 (two) times daily., Starting Wed 10/27/2016, Until Wed 11/03/2016, Normal    fluconazole (DIFLUCAN) 150 MG tablet Take 1 tablet (150 mg total) by mouth daily. Take second dose 72 hours later  if symptoms still persists., Starting Wed 10/27/2016, Normal    metroNIDAZOLE (FLAGYL) 500 MG tablet Take 1 tablet (500 mg total) by mouth 2 (two) times daily., Starting Wed 10/27/2016, Normal          Linward Headland V, PA-C 10/27/16 1655

## 2016-10-28 LAB — CERVICOVAGINAL ANCILLARY ONLY
BACTERIAL VAGINITIS: NEGATIVE
Candida vaginitis: NEGATIVE
Chlamydia: NEGATIVE
NEISSERIA GONORRHEA: POSITIVE — AB
TRICH (WINDOWPATH): POSITIVE — AB

## 2016-10-29 LAB — URINE CULTURE: Culture: >=100000 — AB

## 2016-11-10 ENCOUNTER — Emergency Department (HOSPITAL_COMMUNITY): Payer: Self-pay

## 2016-11-10 ENCOUNTER — Emergency Department (HOSPITAL_COMMUNITY)
Admission: EM | Admit: 2016-11-10 | Discharge: 2016-11-10 | Disposition: A | Discharge status: Home / Self Care | Payer: Self-pay | Attending: Emergency Medicine | Admitting: Emergency Medicine

## 2016-11-10 ENCOUNTER — Encounter (HOSPITAL_COMMUNITY): Payer: Self-pay

## 2016-11-10 DIAGNOSIS — Y939 Activity, unspecified: Secondary | ICD-10-CM | POA: Insufficient documentation

## 2016-11-10 DIAGNOSIS — F17210 Nicotine dependence, cigarettes, uncomplicated: Secondary | ICD-10-CM | POA: Insufficient documentation

## 2016-11-10 DIAGNOSIS — Y999 Unspecified external cause status: Secondary | ICD-10-CM | POA: Insufficient documentation

## 2016-11-10 DIAGNOSIS — B852 Pediculosis, unspecified: Secondary | ICD-10-CM

## 2016-11-10 DIAGNOSIS — Y929 Unspecified place or not applicable: Secondary | ICD-10-CM | POA: Insufficient documentation

## 2016-11-10 DIAGNOSIS — S93402A Sprain of unspecified ligament of left ankle, initial encounter: Secondary | ICD-10-CM | POA: Insufficient documentation

## 2016-11-10 DIAGNOSIS — W010XXA Fall on same level from slipping, tripping and stumbling without subsequent striking against object, initial encounter: Secondary | ICD-10-CM | POA: Insufficient documentation

## 2016-11-10 LAB — POC URINE PREG, ED: Preg Test, Ur: NEGATIVE

## 2016-11-10 MED ORDER — OXYCODONE-ACETAMINOPHEN 5-325 MG PO TABS: 1.0000 | ORAL_TABLET | Freq: Three times a day (TID) | ORAL | 0 refills | Status: DC | PRN

## 2016-11-10 MED ORDER — PERMETHRIN 5 % EX CREA: TOPICAL_CREAM | CUTANEOUS | 0 refills | Status: DC

## 2016-11-10 MED ORDER — PERMETHRIN 1 % EX LOTN: 1.0000 "application " | TOPICAL_LOTION | Freq: Once | CUTANEOUS | 0 refills | Status: AC

## 2016-11-10 MED ORDER — IBUPROFEN 400 MG PO TABS
600.0000 mg | ORAL_TABLET | Freq: Once | ORAL | Status: AC
  Administered 2016-11-10: 600 mg via ORAL
  Filled 2016-11-10: qty 1

## 2016-11-10 MED ORDER — NAPROXEN 250 MG PO TABS
500.0000 mg | ORAL_TABLET | Freq: Once | ORAL | Status: DC
  Filled 2016-11-10: qty 2

## 2016-11-10 MED ORDER — NAPROXEN 500 MG PO TABS: 500.0000 mg | ORAL_TABLET | Freq: Two times a day (BID) | ORAL | 0 refills | Status: DC

## 2016-11-10 NOTE — ED Notes (Signed)
Pt asking for something for pain.  °

## 2016-11-10 NOTE — ED Triage Notes (Signed)
Pt arrived to ed with swollen left ankle/foot; pt states she slipped and fell yesterday; pt states she woke up with swollen ankle/foot; pt c/o pain at 9/10;pt a&ox 4-Monique,RN

## 2016-11-10 NOTE — ED Notes (Signed)
Called pt to be triaged 2x, no response. 

## 2016-11-10 NOTE — Discharge Instructions (Signed)
Please reattach information regarding your condition and RICE therapy Use ankle brace as directed.  Take naproxen and Percocet as needed for pain. Follow-up with orthopedist for possible for further evaluation. Bear weight on area as tolerated. Return to ED for worsening pain, additional injury, redness of joint, numbness and leg.

## 2016-11-10 NOTE — Progress Notes (Signed)
Orthopedic Tech Progress Note Patient Details:  Erica CourserLisa M [name] 03/09/1980 [id number]  Ortho Devices Type of Ortho Device: ASO Ortho Device/Splint Location: LLE Ortho Device/Splint Interventions: Ordered, Application   Jennye MoccasinHughes, Reinaldo Helt Craig 11/10/2016, 10:33 PM

## 2016-11-10 NOTE — ED Provider Notes (Signed)
[facility] EMERGENCY DEPARTMENT Provider Note   [name]: [id number] Arrival date & time: 11/10/16  1946     History   Chief Complaint Chief Complaint  Patient presents with  . Ankle Injury  . Fall    HPI Erica [name] is a 36 y.o. female was no significant past medical history, who presents to ED for evaluation of left ankle pain. She states that yesterday she was walking in the dark when she tripped in a hole. She has been in the toy since the incident but reports pain worse with weightbearing. She reports increased swelling since yesterday. She denies any previous fracture, dislocation or procedure in the area. She states that prior to the fall she felt perfectly fine. She denies any fevers, chills, numbness and leg,head injuries or other symptoms at this time.  HPI  Past Medical History:  Diagnosis Date  . Abnormal Pap smear   . Anxiety    as teen  . Anxiety   . Depression    as teen  . GERD (gastroesophageal reflux disease)   . Headache(784.0)   . No pertinent past medical history   . VBAC, delivered, current hospitalization 05/19/2011    Patient Active Problem List   Diagnosis Date Noted  . Acute appendicitis 07/11/2016  . URI (upper respiratory infection) 09/23/2010  . Right otitis media 09/23/2010  . Positive pregnancy test 09/23/2010    Past Surgical History:  Procedure Laterality Date  . APPENDECTOMY    . CESAREAN SECTION    . COLPOSCOPY    . DILATION AND CURETTAGE OF UTERUS  2010  . LAPAROSCOPIC APPENDECTOMY N/A 07/11/2016   Procedure: APPENDECTOMY LAPAROSCOPIC;  Surgeon: Ricarda Frame, MD;  Location: [facility] ORS;  Service: General;  Laterality: N/A;  . WISDOM TOOTH EXTRACTION      OB History    Gravida Para Term Preterm AB Living   7 6 4 2 1 6    SAB TAB Ectopic Multiple Live Births   1 0 0 0 6       Home Medications    Prior to Admission medications   Medication Sig Start Date End Date Taking? Authorizing Provider    azithromycin (ZITHROMAX) 250 MG tablet Take 1 tablet (250 mg total) by mouth daily. Take first 2 tablets together, then 1 every day until finished. 08/16/16   Deatra Canter, FNP  fluconazole (DIFLUCAN) 150 MG tablet Take 1 tablet (150 mg total) by mouth daily. Take second dose 72 hours later if symptoms still persists. 10/27/16   Cathie Hoops, Amy V, PA-C  metroNIDAZOLE (FLAGYL) 500 MG tablet Take 1 tablet (500 mg total) by mouth 2 (two) times daily. 10/27/16   Cathie Hoops, Amy V, PA-C  naproxen (NAPROSYN) 500 MG tablet Take 1 tablet (500 mg total) by mouth 2 (two) times daily. 11/10/16   Katniss Weedman, PA-C  oxyCODONE-acetaminophen (PERCOCET/ROXICET) 5-325 MG tablet Take 1 tablet by mouth every 8 (eight) hours as needed for severe pain. 11/10/16   Honest Vanleer, PA-C  permethrin (PERMETHRIN LICE TREATMENT) 1 % lotion Apply 1 application topically once. Shampoo, rinse and towel dry hair, saturate hair and scalp with permethrin. Rinse after 10 min; repeat in 1 week if needed 11/10/16 11/10/16  Dietrich Pates, PA-C    Family History Family History  Problem Relation Age of Onset  . Hypertension Mother   . Seizures Mother   . Hypertension Maternal Grandmother   . Arthritis Maternal Grandmother   . Cancer Maternal Grandmother  breast  . Stroke Maternal Grandfather   . Seizures Brother   . Mental illness Brother        paranoid schizophrenia  . Birth defects Daughter        hole in heart  . Seizures Maternal Aunt   . Seizures Brother   . Seizures Cousin   . Seizures Cousin   . Mental retardation Cousin     Social History Social History  Substance Use Topics  . Smoking status: Current Every Day Smoker    Packs/day: 1.00    Years: 13.00    Types: Cigarettes  . Smokeless tobacco: Never Used  . Alcohol use Yes     Comment: rare     Allergies   Levaquin [levofloxacin in d5w]   Review of Systems Review of Systems  Constitutional: Negative for chills and fever.  Musculoskeletal: Positive for  arthralgias, gait problem and joint swelling. Negative for myalgias.  Skin: Negative for color change and rash.     Physical Exam Updated Vital Signs BP 118/76 (BP Location: Right Arm)   Pulse 84   Temp 98.7 F (37.1 C) (Oral)   Resp 18   Ht 5\' 3"  (1.6 m)   Wt 63.5 kg (140 lb)   LMP 11/10/2016   SpO2 100%   BMI 24.80 kg/m   Physical Exam  Constitutional: She appears well-developed and well-nourished. No distress.  HENT:  Head: Normocephalic and atraumatic.  Eyes: Conjunctivae and EOM are normal. No scleral icterus.  Neck: Normal range of motion.  Pulmonary/Chest: Effort normal. No respiratory distress.  Musculoskeletal: Normal range of motion. She exhibits edema and tenderness. She exhibits no deformity.  Tenderness to palpation of the lateral malleolus of the left ankle with notable edema noted. No color or temperature change noted. Pain with flexion and extension as well as inversion and eversion of the ankle. Sensation intact to light touch. 2+ DP pulse noted.  Neurological: She is alert.  Skin: No rash noted. She is not diaphoretic.  Psychiatric: She has a normal mood and affect.  Nursing note and vitals reviewed.    ED Treatments / Results  Labs (all labs ordered are listed, but only abnormal results are displayed) Labs Reviewed  POC URINE PREG, ED    EKG  EKG Interpretation None       Radiology Dg Foot Complete Left  Result Date: 11/10/2016 CLINICAL DATA:  Pain after slip and fall yesterday. Swelling of the ankle and foot. EXAM: LEFT FOOT - COMPLETE 3+ VIEW COMPARISON:  None. FINDINGS: There is no evidence of fracture or dislocation. Small ankle joint effusion is seen. Soft tissue swelling over the lateral malleolus. Small plantar calcaneal enthesophyte. The Lisfranc articulation appears congruent. IMPRESSION: Soft tissue swelling over the lateral malleolus with ankle joint effusion. No acute appearing fracture nor dislocation. Electronically Signed   By:  Tollie Ethavid  Kwon M.D.   On: 11/10/2016 21:24    Procedures Procedures (including critical care time)  Medications Ordered in ED Medications  ibuprofen (ADVIL,MOTRIN) tablet 600 mg (not administered)     Initial Impression / Assessment and Plan / ED Course  I have reviewed the triage vital signs and the nursing notes.  Pertinent labs & imaging results that were available during my care of the patient were reviewed by me and considered in my medical decision making (see chart for details).      patient presents to ED for evaluation of left ankle pain after fall that occurred yesterday. She was walking in the dark  when she tripped and fell into a hole. She has been able toward with pain since the incident. She denies progressive swelling and pain since yesterday. She is not taking any medications to help with symptoms. She denies any previous fracture, dislocation or procedure in the area. On physical exam patient is overall well-appearing. She does have visible edema and tenderness noted on the lateral malleolus of the left ankle. She is pain with range of motion but no color or temperature change noted. I have low suspicion for septic joint being the cause of her pain based on symptoms and onset. Will give patient ankle brace, pain medication follow-up with orthopedist for further evaluation. Patient also states that she had head lice and would like some shampoo to get rid of this. Patient appears stable for discharge at this time. Strict return precautions given.  Final Clinical Impressions(s) / ED Diagnoses   Final diagnoses:  Sprain of left ankle, unspecified ligament, initial encounter  Lice    New Prescriptions New Prescriptions   NAPROXEN (NAPROSYN) 500 MG TABLET    Take 1 tablet (500 mg total) by mouth 2 (two) times daily.   OXYCODONE-ACETAMINOPHEN (PERCOCET/ROXICET) 5-325 MG TABLET    Take 1 tablet by mouth every 8 (eight) hours as needed for severe pain.   PERMETHRIN (PERMETHRIN  LICE TREATMENT) 1 % LOTION    Apply 1 application topically once. Shampoo, rinse and towel dry hair, saturate hair and scalp with permethrin. Rinse after 10 min; repeat in 1 week if needed     Dietrich Pates, PA-C 11/10/16 2222    Rolland Porter, MD 11/15/16 [phone]

## 2016-11-10 NOTE — ED Notes (Signed)
Called back for room x3, no answer.

## 2016-11-12 ENCOUNTER — Telehealth (HOSPITAL_COMMUNITY): Payer: Self-pay | Admitting: Family Medicine

## 2016-11-12 MED ORDER — METRONIDAZOLE 500 MG PO TABS: 500.0000 mg | ORAL_TABLET | Freq: Two times a day (BID) | ORAL | 0 refills | Status: DC

## 2016-11-12 MED ORDER — FLUCONAZOLE 150 MG PO TABS: 150.0000 mg | ORAL_TABLET | Freq: Every day | ORAL | 0 refills | Status: DC

## 2016-11-12 NOTE — Telephone Encounter (Signed)
Pt spoken with over the phone about lab results and the importance of treatment. Medications sent to [facility] [place]. Pt okay with plan and reports she is returning soon for blood work to r/o HIV.

## 2016-12-06 ENCOUNTER — Ambulatory Visit (HOSPITAL_COMMUNITY)
Admission: EM | Admit: 2016-12-06 | Discharge: 2016-12-06 | Disposition: A | Discharge status: Home / Self Care | Payer: Self-pay | Attending: Emergency Medicine | Admitting: Emergency Medicine

## 2016-12-06 ENCOUNTER — Encounter (HOSPITAL_COMMUNITY): Payer: Self-pay | Admitting: Emergency Medicine

## 2016-12-06 ENCOUNTER — Other Ambulatory Visit: Payer: Self-pay

## 2016-12-06 DIAGNOSIS — N390 Urinary tract infection, site not specified: Secondary | ICD-10-CM

## 2016-12-06 DIAGNOSIS — Z113 Encounter for screening for infections with a predominantly sexual mode of transmission: Secondary | ICD-10-CM

## 2016-12-06 DIAGNOSIS — Z3202 Encounter for pregnancy test, result negative: Secondary | ICD-10-CM | POA: Insufficient documentation

## 2016-12-06 DIAGNOSIS — N898 Other specified noninflammatory disorders of vagina: Secondary | ICD-10-CM | POA: Insufficient documentation

## 2016-12-06 DIAGNOSIS — R350 Frequency of micturition: Secondary | ICD-10-CM | POA: Insufficient documentation

## 2016-12-06 LAB — POCT URINALYSIS DIP (DEVICE)
BILIRUBIN URINE: NEGATIVE
GLUCOSE, UA: NEGATIVE mg/dL
Hgb urine dipstick: NEGATIVE
KETONES UR: NEGATIVE mg/dL
Leukocytes, UA: NEGATIVE
NITRITE: POSITIVE — AB
PROTEIN: NEGATIVE mg/dL
Specific Gravity, Urine: >=1.03 (ref 1.005–1.030)
Urobilinogen, UA: 0.2 mg/dL (ref 0.0–1.0)
pH: 5.5 (ref 5.0–8.0)

## 2016-12-06 LAB — POCT PREGNANCY, URINE: PREG TEST UR: NEGATIVE

## 2016-12-06 MED ORDER — BUPIVACAINE HCL (PF) 0.5 % IJ SOLN
INTRAMUSCULAR | Status: AC
  Filled 2016-12-06: qty 10

## 2016-12-06 MED ORDER — METRONIDAZOLE 500 MG PO TABS: 500.0000 mg | ORAL_TABLET | Freq: Two times a day (BID) | ORAL | 0 refills | Status: DC

## 2016-12-06 MED ORDER — CEFTRIAXONE SODIUM 250 MG IJ SOLR
250.0000 mg | Freq: Once | INTRAMUSCULAR | Status: AC
  Administered 2016-12-06: 250 mg via INTRAMUSCULAR

## 2016-12-06 MED ORDER — SULFAMETHOXAZOLE-TRIMETHOPRIM 800-160 MG PO TABS: 1.0000 | ORAL_TABLET | Freq: Two times a day (BID) | ORAL | 0 refills | Status: DC

## 2016-12-06 MED ORDER — CEFTRIAXONE SODIUM 250 MG IJ SOLR
INTRAMUSCULAR | Status: AC
  Filled 2016-12-06: qty 250

## 2016-12-06 MED ORDER — AZITHROMYCIN 250 MG PO TABS
ORAL_TABLET | ORAL | Status: AC
  Filled 2016-12-06: qty 4

## 2016-12-06 MED ORDER — AZITHROMYCIN 250 MG PO TABS
1000.0000 mg | ORAL_TABLET | Freq: Once | ORAL | Status: AC
  Administered 2016-12-06: 1000 mg via ORAL

## 2016-12-06 MED ORDER — PERMETHRIN 5 % EX CREA: TOPICAL_CREAM | CUTANEOUS | 0 refills | Status: DC

## 2016-12-06 MED ORDER — BUPIVACAINE-EPINEPHRINE (PF) 0.5% -1:200000 IJ SOLN
INTRAMUSCULAR | Status: AC
  Filled 2016-12-06: qty 1.8

## 2016-12-06 MED ORDER — LIDOCAINE HCL (PF) 1 % IJ SOLN
INTRAMUSCULAR | Status: AC
  Filled 2016-12-06: qty 2

## 2016-12-06 NOTE — ED Triage Notes (Signed)
Pt c/o vaginal itching, burning. Came here a few weeks ago had a pelvic exam done. Had UTI gave meds for that, trichomonas, gonorrhea. Pt didn't take her antibiotics correctly.

## 2016-12-06 NOTE — Discharge Instructions (Signed)
Take the medication as written. Give us a working phone number so that we can contact you if needed. Refrain from sexual contact until you know your results and your partner(s) are treated if necessary. Return to the ER if you get worse, have a fever >100.4, or for any concerns.  ° °Go to www.goodrx.com to look up your medications. This will give you a list of where you can find your prescriptions at the most affordable prices. Or ask the pharmacist what the cash price is, or if they have any other discount programs available to help make your medication more affordable. This can be less expensive than what you would pay with insurance.  ° °

## 2016-12-06 NOTE — ED Provider Notes (Signed)
HPI  SUBJECTIVE:  Erica [name] is a 36 y.o. female who presents with vulvar itching, states that "something is biting me" for the past week.  She also reports urinary frequency and cloudy urine.  She tried over-the-counter yeast cream and Monistat without improvement of symptoms, she states that the itching is worse at night.  She denies itching elsewhere.  States that her symptoms started after sleeping on a motel floor.  She denies genital rash, swelling, discharge, odor.  No dysuria, urgency, odorous urine, hematuria.  No abdominal, pelvic, back pain.  No fevers.  She has had 2 sexual partners recently, but states that she is in a monogamous relationship with 1 of them now.  She does not use condoms or birth control with her current partner.  He is asymptomatic to her knowledge, but she wants testing and treatment for all STDs today.  She was seen here on 10/3 for urinary symptoms and vaginal d/c. treated as UTI with keflex and covered for STD with rocephin and azithromycin. Flagyl and diflucan called in. Ucx positive for  Klebsiella sensitive to keflex. Gonorrhea, trichomonas positive.  States that she not finish the Keflex or Flagyl.  She has a past medical history of gonorrhea, trichomonas, BV, yeast, UTI, pyelonephritis.  No history of chlamydia, HIV, HSV, diabetes, hypertension.  LMP: 10/15, not sure if she could be pregnant.  PMD: None.  Past Medical History:  Diagnosis Date  . Abnormal Pap smear   . Anxiety    as teen  . Anxiety   . Depression    as teen  . GERD (gastroesophageal reflux disease)   . Headache(784.0)   . No pertinent past medical history   . VBAC, delivered, current hospitalization 05/19/2011    Past Surgical History:  Procedure Laterality Date  . APPENDECTOMY    . CESAREAN SECTION    . COLPOSCOPY    . DILATION AND CURETTAGE OF UTERUS  2010  . WISDOM TOOTH EXTRACTION      Family History  Problem Relation Age of Onset  . Hypertension Mother   . Seizures Mother    . Hypertension Maternal Grandmother   . Arthritis Maternal Grandmother   . Cancer Maternal Grandmother        breast  . Stroke Maternal Grandfather   . Seizures Brother   . Mental illness Brother        paranoid schizophrenia  . Birth defects Daughter        hole in heart  . Seizures Maternal Aunt   . Seizures Brother   . Seizures Cousin   . Seizures Cousin   . Mental retardation Cousin     Social History   Tobacco Use  . Smoking status: Current Every Day Smoker    Packs/day: 1.00    Years: 13.00    Pack years: 13.00    Types: Cigarettes  . Smokeless tobacco: Never Used  Substance Use Topics  . Alcohol use: Yes    Comment: rare  . Drug use: No    Comment: last used 3 weeks ago    No current facility-administered medications for this encounter.   Current Outpatient Medications:  .  metroNIDAZOLE (FLAGYL) 500 MG tablet, Take 1 tablet (500 mg total) 2 (two) times daily by mouth., Disp: 14 tablet, Rfl: 0 .  naproxen (NAPROSYN) 500 MG tablet, Take 1 tablet (500 mg total) by mouth 2 (two) times daily., Disp: 30 tablet, Rfl: 0 .  oxyCODONE-acetaminophen (PERCOCET/ROXICET) 5-325 MG tablet, Take 1 tablet by  mouth every 8 (eight) hours as needed for severe pain., Disp: 6 tablet, Rfl: 0 .  permethrin (ELIMITE) 5 % cream, Apply from chin down, leave on for 8-14 hours, rinse. Repeat in 1 week, Disp: 60 g, Rfl: 0 .  sulfamethoxazole-trimethoprim (BACTRIM DS,SEPTRA DS) 800-160 MG tablet, Take 1 tablet 2 (two) times daily by mouth., Disp: 6 tablet, Rfl: 0  Allergies  Allergen Reactions  . Levaquin [Levofloxacin In D5w] Swelling    Lip swelling, hives, chest tightness     ROS  As noted in HPI.   Physical Exam  BP 118/67   Pulse 83   Temp 98.3 F (36.8 C)   Resp 18   LMP 11/10/2016   SpO2 100%   Constitutional: Well developed, well nourished, no acute distress Eyes:  EOMI, conjunctiva normal bilaterally HENT: Normocephalic, atraumatic,mucus membranes  moist Respiratory: Normal inspiratory effort Cardiovascular: Normal rate GI: nondistended soft, nontender. No suprapubic tenderness  back: No CVA tenderness GU: External genitalia normal.  Shaved.  No rash or noticeable lice..  Normal vaginal mucosa.  Normal os. thick nonoderous  white/ yellowish vaginal discharge. No discharge.  Uterus smooth,NT. No CMT. No  adnexal tenderness. No adnexal masses.  Chaperone present during exam skin: No rash, skin intact Musculoskeletal: no deformities Neurologic: Alert & oriented x 3, no focal neuro deficits Psychiatric: Speech and behavior appropriate   ED Course   Medications  cefTRIAXone (ROCEPHIN) injection 250 mg (250 mg Intramuscular Given 12/06/16 1850)  azithromycin (ZITHROMAX) tablet 1,000 mg (1,000 mg Oral Given 12/06/16 1849)    Orders Placed This Encounter  Procedures  . Urine culture    Standing Status:   Standing    Number of Occurrences:   1  . HIV antibody    Standing Status:   Standing    Number of Occurrences:   1  . RPR    Standing Status:   Standing    Number of Occurrences:   1  . POCT urinalysis dip (device)    Standing Status:   Standing    Number of Occurrences:   1  . Pregnancy, urine POC    Standing Status:   Standing    Number of Occurrences:   1    Results for orders placed or performed during the hospital encounter of 12/06/16 (from the past 24 hour(s))  POCT urinalysis dip (device)     Status: Abnormal   Collection Time: 12/06/16  6:31 PM  Result Value Ref Range   Glucose, UA NEGATIVE NEGATIVE mg/dL   Bilirubin Urine NEGATIVE NEGATIVE   Ketones, ur NEGATIVE NEGATIVE mg/dL   Specific Gravity, Urine >=1.030 1.005 - 1.030   Hgb urine dipstick NEGATIVE NEGATIVE   pH 5.5 5.0 - 8.0   Protein, ur NEGATIVE NEGATIVE mg/dL   Urobilinogen, UA 0.2 0.0 - 1.0 mg/dL   Nitrite POSITIVE (A) NEGATIVE   Leukocytes, UA NEGATIVE NEGATIVE  Pregnancy, urine POC     Status: None   Collection Time: 12/06/16  6:37 PM  Result  Value Ref Range   Preg Test, Ur NEGATIVE NEGATIVE   No results found.  ED Clinical Impression  Urinary tract infection without hematuria, site unspecified  Vaginal itching   ED Assessment/Plan  Previous records labs reviewed. as noted in HPI.   We will assume that she is partially treated for trichomonas, so we will restart the Flagyl.  Giving Rocephin 250 mg IM and azithromycin 1 g p.o. to cover gonorrhea and chlamydia since she has been sexually active since  being treated and she is not sure of the status of her current sexual partner.  Sent off gonorrhea, chlamydia, Gardnerella, trichomonas, yeast.  Also testing for HIV, RPR.  Urine pregnancy negative, UA positive for nitrite.  Given that she was just on Keflex, will send this off for culture to confirm antibiotic choice.  Her UTI was sensitive to cephalosporins, Bactrim, intermediate for Macrobid.  Will start Bactrim for UTI.   Also permethrin in case she has scabies versus pubic lice.  I did not visualize anything on exam today.  Advised pt to refrain from sexual contact until she  knows lab results, symptoms resolve, and partner(s) are treated if necessary. Pt provided working phone number. Follow-up with PMD of choice as needed.  Provide primary care referral and primary care referral list.  Discussed labs, MDM, plan and followup with patient. Pt agrees with plan.   Meds ordered this encounter  Medications  . cefTRIAXone (ROCEPHIN) injection 250 mg  . azithromycin (ZITHROMAX) tablet 1,000 mg  . metroNIDAZOLE (FLAGYL) 500 MG tablet    Sig: Take 1 tablet (500 mg total) 2 (two) times daily by mouth.    Dispense:  14 tablet    Refill:  0  . sulfamethoxazole-trimethoprim (BACTRIM DS,SEPTRA DS) 800-160 MG tablet    Sig: Take 1 tablet 2 (two) times daily by mouth.    Dispense:  6 tablet    Refill:  0  . permethrin (ELIMITE) 5 % cream    Sig: Apply from chin down, leave on for 8-14 hours, rinse. Repeat in 1 week    Dispense:  60 g     Refill:  0    *This clinic note was created using Scientist, clinical (histocompatibility and immunogenetics)Dragon dictation software. Therefore, there may be occasional mistakes despite careful proofreading.  ?    Domenick GongMortenson, Jock Mahon, MD 12/07/16 [phone]

## 2016-12-07 LAB — CERVICOVAGINAL ANCILLARY ONLY
Bacterial vaginitis: NEGATIVE
Candida vaginitis: NEGATIVE
Chlamydia: NEGATIVE
NEISSERIA GONORRHEA: NEGATIVE
Trichomonas: NEGATIVE

## 2016-12-07 LAB — RPR: RPR: NONREACTIVE

## 2016-12-07 LAB — HIV ANTIBODY (ROUTINE TESTING W REFLEX): HIV SCREEN 4TH GENERATION: NONREACTIVE

## 2016-12-09 ENCOUNTER — Other Ambulatory Visit: Payer: Self-pay

## 2016-12-09 ENCOUNTER — Emergency Department: Payer: Self-pay

## 2016-12-09 ENCOUNTER — Emergency Department
Admission: EM | Admit: 2016-12-09 | Discharge: 2016-12-09 | Disposition: A | Discharge status: Home / Self Care | Payer: Self-pay | Attending: Student in an Organized Health Care Education/Training Program | Admitting: Student in an Organized Health Care Education/Training Program

## 2016-12-09 DIAGNOSIS — Z79899 Other long term (current) drug therapy: Secondary | ICD-10-CM | POA: Insufficient documentation

## 2016-12-09 DIAGNOSIS — M542 Cervicalgia: Secondary | ICD-10-CM | POA: Insufficient documentation

## 2016-12-09 DIAGNOSIS — F17210 Nicotine dependence, cigarettes, uncomplicated: Secondary | ICD-10-CM | POA: Insufficient documentation

## 2016-12-09 DIAGNOSIS — M7918 Myalgia, other site: Secondary | ICD-10-CM | POA: Insufficient documentation

## 2016-12-09 LAB — URINE CULTURE: Culture: >=100000 — AB

## 2016-12-09 MED ORDER — KETOROLAC TROMETHAMINE 10 MG PO TABS: 10.0000 mg | ORAL_TABLET | Freq: Four times a day (QID) | ORAL | 0 refills | Status: AC | PRN

## 2016-12-09 MED ORDER — CYCLOBENZAPRINE HCL 5 MG PO TABS: 5.0000 mg | ORAL_TABLET | Freq: Three times a day (TID) | ORAL | 0 refills | Status: AC | PRN

## 2016-12-09 MED ORDER — KETOROLAC TROMETHAMINE 30 MG/ML IJ SOLN
30.0000 mg | Freq: Once | INTRAMUSCULAR | Status: AC
  Administered 2016-12-09: 30 mg via INTRAMUSCULAR
  Filled 2016-12-09: qty 1

## 2016-12-09 NOTE — ED Notes (Signed)
Pt is ambulatory without difficulty. NAD. VSS. Resp easy and non-labored. Skin PWD. No questions or concerns voiced at time of discharge.
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
## 2017-02-02 ENCOUNTER — Emergency Department (HOSPITAL_COMMUNITY): Payer: Self-pay

## 2017-02-02 ENCOUNTER — Emergency Department (HOSPITAL_COMMUNITY)
Admission: EM | Admit: 2017-02-02 | Discharge: 2017-02-02 | Disposition: A | Discharge status: Home / Self Care | Payer: Self-pay | Attending: Emergency Medicine | Admitting: Emergency Medicine

## 2017-02-02 ENCOUNTER — Encounter (HOSPITAL_COMMUNITY): Payer: Self-pay | Admitting: *Deleted

## 2017-02-02 DIAGNOSIS — F17210 Nicotine dependence, cigarettes, uncomplicated: Secondary | ICD-10-CM | POA: Insufficient documentation

## 2017-02-02 DIAGNOSIS — M79605 Pain in left leg: Secondary | ICD-10-CM | POA: Insufficient documentation

## 2017-02-02 DIAGNOSIS — Z79899 Other long term (current) drug therapy: Secondary | ICD-10-CM | POA: Insufficient documentation

## 2017-02-02 DIAGNOSIS — M79602 Pain in left arm: Secondary | ICD-10-CM

## 2017-02-02 DIAGNOSIS — R05 Cough: Secondary | ICD-10-CM | POA: Insufficient documentation

## 2017-02-02 MED ORDER — TETANUS-DIPHTH-ACELL PERTUSSIS 5-2.5-18.5 LF-MCG/0.5 IM SUSP
0.5000 mL | Freq: Once | INTRAMUSCULAR | Status: AC
  Administered 2017-02-02: 0.5 mL via INTRAMUSCULAR
  Filled 2017-02-02: qty 0.5

## 2017-02-02 MED ORDER — IBUPROFEN 400 MG PO TABS
600.0000 mg | ORAL_TABLET | Freq: Four times a day (QID) | ORAL | Status: DC | PRN
  Filled 2017-02-02: qty 1

## 2017-02-02 NOTE — ED Notes (Signed)
Patient given a bag meal to eat and coke.
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
## 2017-02-02 NOTE — ED Notes (Signed)
Patient reports mild pain at discharge.  Extra bacitracin provided for home use.

## 2017-02-02 NOTE — ED Notes (Signed)
Patient informed of need to remain NPO till after imaging.

## 2017-02-02 NOTE — ED Notes (Signed)
Phlebotomy notified of need for [employer] blooddraw.

## 2017-02-02 NOTE — ED Notes (Signed)
Patient transported to X-ray 

## 2017-02-02 NOTE — ED Notes (Signed)
Patient provided with menu to pick out dinner tray

## 2017-02-02 NOTE — ED Notes (Signed)
CSW spoke with family reference to follow-up plan.  Family willingly provided their contact information and were in agreement with the plan.

## 2017-03-18 IMAGING — CR DG ANKLE COMPLETE 3+V*L*
1 series · 3 of 3 positions shown · non-contrast
Comparison: Foot and ankle radiographs 10/31/2008

CLINICAL DATA: Left ankle pain and swelling after injury, missed a
step going down stairs today.

EXAM:
LEFT ANKLE COMPLETE - 3+ VIEW

[Series 1: dg ankle complete left · 0.14mm/px · 3 of 3 slices shown]
[im 1/3]
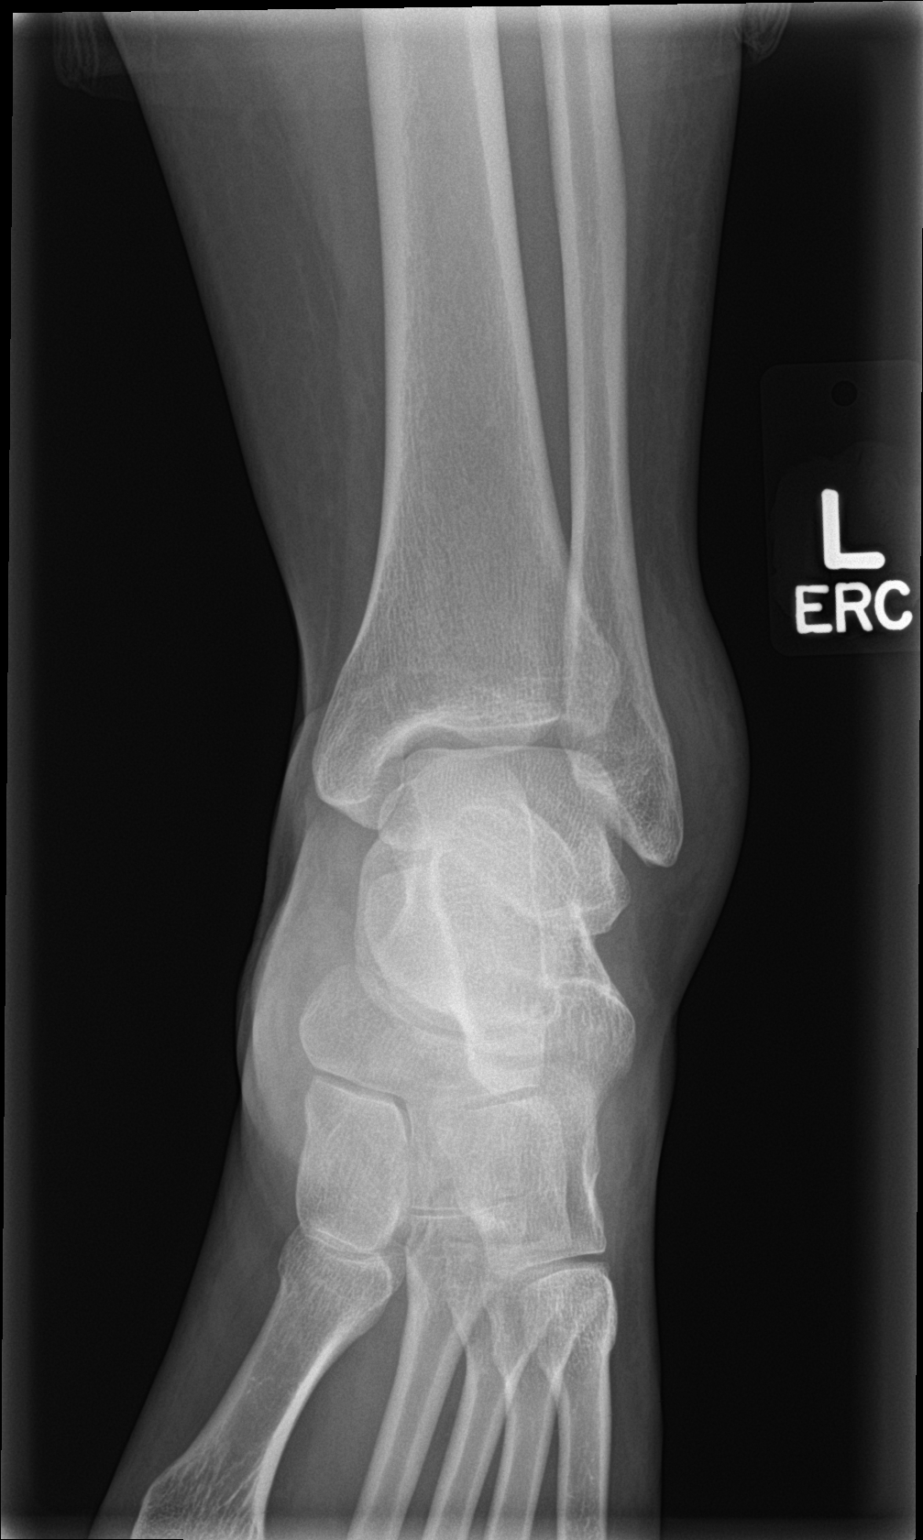
[im 2/3]
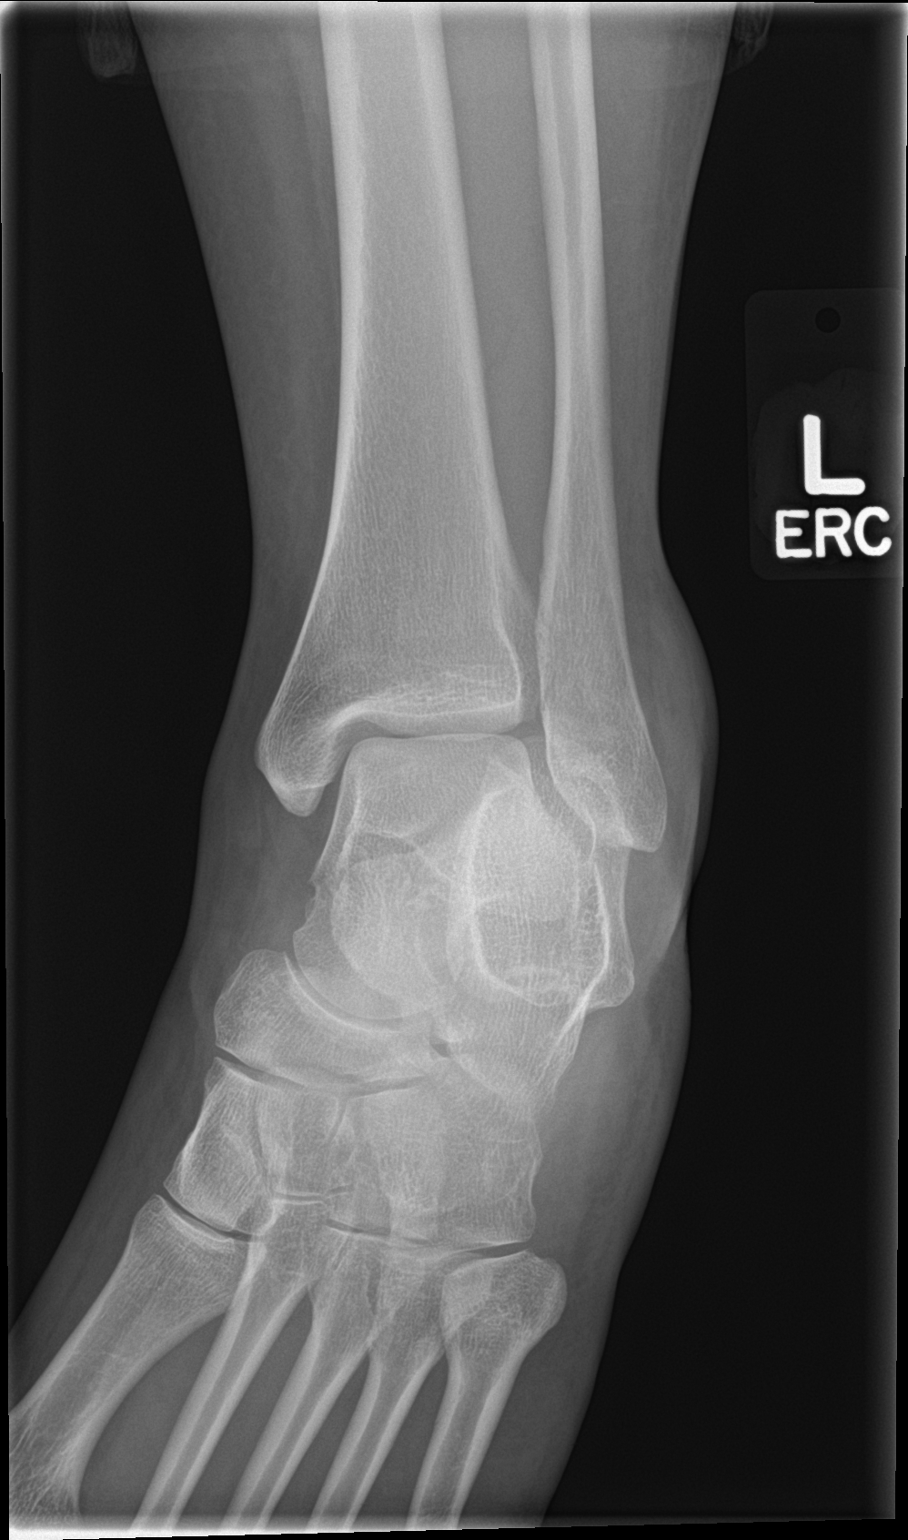
[im 3/3]
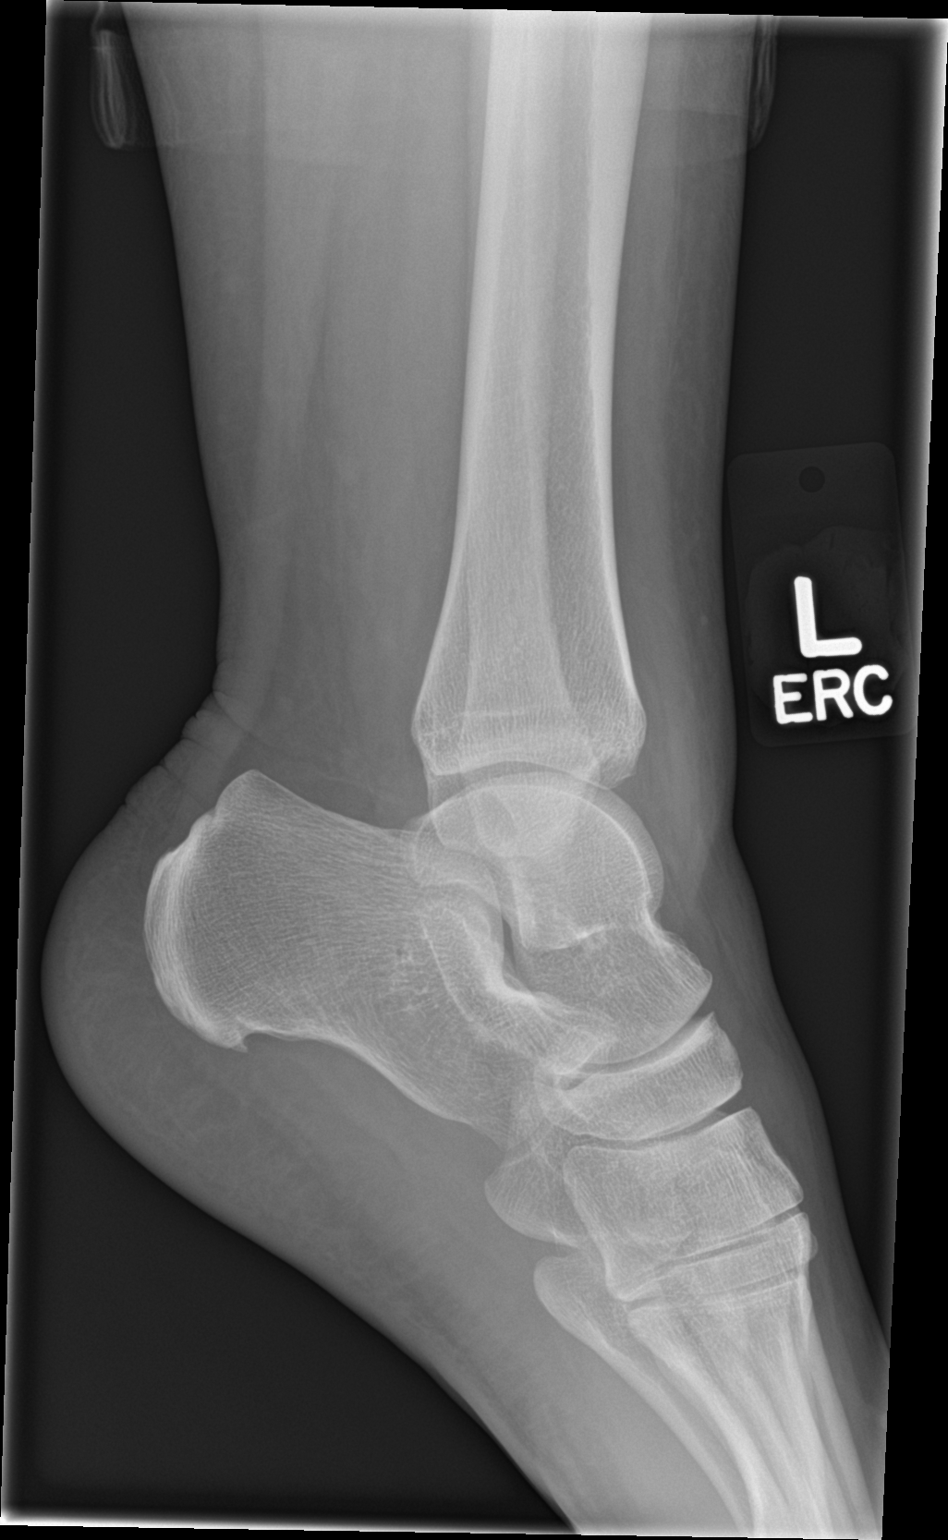

[3 of 3 positions shown; findings below may reference images not displayed]

FINDINGS: Anterior and lateral soft tissue edema. There is no evidence of
fracture, dislocation, or joint effusion. There is no evidence of
arthropathy or other focal bone abnormality. The ankle mortise is
preserved. Tiny plantar calcaneal spur.
IMPRESSION: Anterior and lateral soft tissue edema. No acute fracture or
subluxation.

## 2017-04-27 ENCOUNTER — Encounter (HOSPITAL_COMMUNITY): Payer: Self-pay | Admitting: Emergency Medicine

## 2017-04-27 ENCOUNTER — Ambulatory Visit (HOSPITAL_COMMUNITY)
Admission: EM | Admit: 2017-04-27 | Discharge: 2017-04-27 | Disposition: A | Discharge status: Home / Self Care | Payer: Self-pay | Attending: Family Medicine | Admitting: Family Medicine

## 2017-04-27 DIAGNOSIS — Z823 Family history of stroke: Secondary | ICD-10-CM | POA: Insufficient documentation

## 2017-04-27 DIAGNOSIS — L292 Pruritus vulvae: Secondary | ICD-10-CM | POA: Insufficient documentation

## 2017-04-27 DIAGNOSIS — F17210 Nicotine dependence, cigarettes, uncomplicated: Secondary | ICD-10-CM | POA: Insufficient documentation

## 2017-04-27 DIAGNOSIS — Z803 Family history of malignant neoplasm of breast: Secondary | ICD-10-CM | POA: Insufficient documentation

## 2017-04-27 DIAGNOSIS — Z881 Allergy status to other antibiotic agents status: Secondary | ICD-10-CM | POA: Insufficient documentation

## 2017-04-27 DIAGNOSIS — Z9889 Other specified postprocedural states: Secondary | ICD-10-CM | POA: Insufficient documentation

## 2017-04-27 DIAGNOSIS — R05 Cough: Secondary | ICD-10-CM | POA: Insufficient documentation

## 2017-04-27 DIAGNOSIS — Z791 Long term (current) use of non-steroidal anti-inflammatories (NSAID): Secondary | ICD-10-CM | POA: Insufficient documentation

## 2017-04-27 DIAGNOSIS — N898 Other specified noninflammatory disorders of vagina: Secondary | ICD-10-CM

## 2017-04-27 DIAGNOSIS — Z8249 Family history of ischemic heart disease and other diseases of the circulatory system: Secondary | ICD-10-CM | POA: Insufficient documentation

## 2017-04-27 DIAGNOSIS — Z79891 Long term (current) use of opiate analgesic: Secondary | ICD-10-CM | POA: Insufficient documentation

## 2017-04-27 DIAGNOSIS — R300 Dysuria: Secondary | ICD-10-CM | POA: Insufficient documentation

## 2017-04-27 DIAGNOSIS — F419 Anxiety disorder, unspecified: Secondary | ICD-10-CM | POA: Insufficient documentation

## 2017-04-27 DIAGNOSIS — J029 Acute pharyngitis, unspecified: Secondary | ICD-10-CM | POA: Insufficient documentation

## 2017-04-27 DIAGNOSIS — Z79899 Other long term (current) drug therapy: Secondary | ICD-10-CM | POA: Insufficient documentation

## 2017-04-27 DIAGNOSIS — B349 Viral infection, unspecified: Secondary | ICD-10-CM | POA: Insufficient documentation

## 2017-04-27 DIAGNOSIS — R350 Frequency of micturition: Secondary | ICD-10-CM | POA: Insufficient documentation

## 2017-04-27 DIAGNOSIS — Z818 Family history of other mental and behavioral disorders: Secondary | ICD-10-CM | POA: Insufficient documentation

## 2017-04-27 DIAGNOSIS — Z8261 Family history of arthritis: Secondary | ICD-10-CM | POA: Insufficient documentation

## 2017-04-27 DIAGNOSIS — F329 Major depressive disorder, single episode, unspecified: Secondary | ICD-10-CM | POA: Insufficient documentation

## 2017-04-27 DIAGNOSIS — K219 Gastro-esophageal reflux disease without esophagitis: Secondary | ICD-10-CM | POA: Insufficient documentation

## 2017-04-27 LAB — POCT PREGNANCY, URINE: Preg Test, Ur: NEGATIVE

## 2017-04-27 LAB — POCT URINALYSIS DIP (DEVICE)
BILIRUBIN URINE: NEGATIVE
GLUCOSE, UA: NEGATIVE mg/dL
Hgb urine dipstick: NEGATIVE
KETONES UR: NEGATIVE mg/dL
Nitrite: NEGATIVE
Protein, ur: NEGATIVE mg/dL
SPECIFIC GRAVITY, URINE: 1.025 (ref 1.005–1.030)
UROBILINOGEN UA: 0.2 mg/dL (ref 0.0–1.0)
pH: 6 (ref 5.0–8.0)

## 2017-04-27 LAB — POCT RAPID STREP A: Streptococcus, Group A Screen (Direct): NEGATIVE

## 2017-04-27 MED ORDER — BENZONATATE 100 MG PO CAPS: 100.0000 mg | ORAL_CAPSULE | Freq: Three times a day (TID) | ORAL | 0 refills | Status: DC

## 2017-04-27 MED ORDER — FLUCONAZOLE 150 MG PO TABS: 150.0000 mg | ORAL_TABLET | Freq: Every day | ORAL | 0 refills | Status: DC

## 2017-04-27 MED ORDER — FLUTICASONE PROPIONATE 50 MCG/ACT NA SUSP: 2.0000 | Freq: Every day | NASAL | 0 refills | Status: DC

## 2017-04-27 MED ORDER — CEPHALEXIN 500 MG PO CAPS: 500.0000 mg | ORAL_CAPSULE | Freq: Four times a day (QID) | ORAL | 0 refills | Status: AC

## 2017-04-27 NOTE — ED Notes (Signed)
Pt given goodrx coupons for medications. Discharged by provider

## 2017-04-27 NOTE — ED Provider Notes (Signed)
[facility]    [name]: [id number] Arrival date & time: 04/27/17  1110     History   Chief Complaint Chief Complaint  Patient presents with  . Sore Throat    HPI Erica [name] is a 37 y.o. female.   37 year old female comes in for multiple complaints.   4-5 day history of URI symptoms. She has had sore throat, cough, nasal congestion. Unsure fever, but has had chills and body aches. otc ibuprofen without relief, last dose yesterday. Current every day smoker.   She has had 2 day history of vaginal itching and swelling. Started with a new partner with some condom use. No other new changes noted. Has had urinary frequency, dysuria without hematuria. No obvious abdominal pain, nausea, vomiting. LMP 04/22/2017.      Past Medical History:  Diagnosis Date  . Abnormal Pap smear   . Anxiety    as teen  . Anxiety   . Depression    as teen  . GERD (gastroesophageal reflux disease)   . Headache(784.0)   . No pertinent past medical history   . VBAC, delivered, current hospitalization 05/19/2011    Patient Active Problem List   Diagnosis Date Noted  . Acute appendicitis 07/11/2016  . URI (upper respiratory infection) 09/23/2010  . Right otitis media 09/23/2010  . Positive pregnancy test 09/23/2010    Past Surgical History:  Procedure Laterality Date  . APPENDECTOMY    . CESAREAN SECTION    . COLPOSCOPY    . DILATION AND CURETTAGE OF UTERUS  2010  . LAPAROSCOPIC APPENDECTOMY N/A 07/11/2016   Procedure: APPENDECTOMY LAPAROSCOPIC;  Surgeon: Ricarda FrameWoodham, Charles, MD;  Location: [facility] ORS;  Service: General;  Laterality: N/A;  . WISDOM TOOTH EXTRACTION      OB History    Gravida  7   Para  6   Term  4   Preterm  2   AB  1   Living  6     SAB  1   TAB  0   Ectopic  0   Multiple  0   Live Births  6            Home Medications    Prior to Admission medications   Medication Sig Start Date End Date Taking? Authorizing Provider  benzonatate  (TESSALON) 100 MG capsule Take 1 capsule (100 mg total) by mouth every 8 (eight) hours. 04/27/17   Cathie HoopsYu, Newell Frater V, PA-C  cephALEXin (KEFLEX) 500 MG capsule Take 1 capsule (500 mg total) by mouth 4 (four) times daily for 5 days. 04/27/17 05/02/17  Belinda FisherYu, Bawi Lakins V, PA-C  fluconazole (DIFLUCAN) 150 MG tablet Take 1 tablet (150 mg total) by mouth daily. 04/27/17   Cathie HoopsYu, Shaylon Aden V, PA-C  fluticasone (FLONASE) 50 MCG/ACT nasal spray Place 2 sprays into both nostrils daily. 04/27/17   Cathie HoopsYu, Calum Cormier V, PA-C  naproxen (NAPROSYN) 500 MG tablet Take 1 tablet (500 mg total) by mouth 2 (two) times daily. Patient not taking: Reported on 02/02/2017 11/10/16   Dietrich PatesKhatri, Hina, PA-C  oxyCODONE-acetaminophen (PERCOCET/ROXICET) 5-325 MG tablet Take 1 tablet by mouth every 8 (eight) hours as needed for severe pain. Patient not taking: Reported on 02/02/2017 11/10/16   Dietrich PatesKhatri, Hina, PA-C    Family History Family History  Problem Relation Age of Onset  . Hypertension Mother   . Seizures Mother   . Hypertension Maternal Grandmother   . Arthritis Maternal Grandmother   . Cancer Maternal Grandmother  breast  . Stroke Maternal Grandfather   . Seizures Brother   . Mental illness Brother        paranoid schizophrenia  . Birth defects Daughter        hole in heart  . Seizures Maternal Aunt   . Seizures Brother   . Seizures Cousin   . Seizures Cousin   . Mental retardation Cousin     Social History Social History   Tobacco Use  . Smoking status: Current Every Day Smoker    Packs/day: 1.00    Years: 13.00    Pack years: 13.00    Types: Cigarettes  . Smokeless tobacco: Never Used  Substance Use Topics  . Alcohol use: Yes    Comment: rare  . Drug use: No    Types: Cocaine    Comment: last used 3 weeks ago     Allergies   Levaquin [levofloxacin in d5w]   Review of Systems Review of Systems  Reason unable to perform ROS: See HPI as above.     Physical Exam Triage Vital Signs ED Triage Vitals [04/27/17 1228]  Enc Vitals  Group     BP 120/84     Pulse Rate 85     Resp 18     Temp 98.2 F (36.8 C)     Temp Source Oral     SpO2 97 %     Weight      Height      Head Circumference      Peak Flow      Pain Score      Pain Loc      Pain Edu?      Excl. in GC?    No data found.  Updated Vital Signs BP 120/84 (BP Location: Right Arm)   Pulse 85   Temp 98.2 F (36.8 C) (Oral)   Resp 18   SpO2 97%   Physical Exam  Constitutional: She is oriented to person, place, and time. She appears well-developed and well-nourished.  Non-toxic appearance. She does not appear ill. No distress.  Patient started eating crackers half way through exam.   HENT:  Head: Normocephalic and atraumatic.  Right Ear: Tympanic membrane, external ear and ear canal normal. Tympanic membrane is not erythematous and not bulging.  Left Ear: Tympanic membrane, external ear and ear canal normal. Tympanic membrane is not erythematous and not bulging.  Nose: Nose normal. Right sinus exhibits no maxillary sinus tenderness and no frontal sinus tenderness. Left sinus exhibits no maxillary sinus tenderness and no frontal sinus tenderness.  Mouth/Throat: Uvula is midline, oropharynx is clear and moist and mucous membranes are normal.  Patient eating crackers during exam. No obvious exudates on exam. Tonsils 2+ and equal bilaterally. Uvula midline.   Eyes: Pupils are equal, round, and reactive to light. Conjunctivae are normal.  Neck: Normal range of motion. Neck supple.  Cardiovascular: Normal rate, regular rhythm and normal heart sounds. Exam reveals no gallop and no friction rub.  No murmur heard. Pulmonary/Chest: Effort normal and breath sounds normal. She has no decreased breath sounds. She has no wheezes. She has no rhonchi. She has no rales.  Abdominal: Soft. Bowel sounds are normal. She exhibits no mass. There is no tenderness. There is no rigidity, no rebound, no guarding and no CVA tenderness.  Lymphadenopathy:    She has no cervical  adenopathy.  Neurological: She is alert and oriented to person, place, and time.  Skin: Skin is warm and dry.  Psychiatric:  She has a normal mood and affect. Her behavior is normal. Judgment normal.  Nursing note and vitals reviewed.    UC Treatments / Results  Labs (all labs ordered are listed, but only abnormal results are displayed) Labs Reviewed  POCT URINALYSIS DIP (DEVICE) - Abnormal; Notable for the following components:      Result Value   Leukocytes, UA SMALL (*)    All other components within normal limits  CULTURE, GROUP A STREP [facility])  URINE CULTURE  POCT RAPID STREP A  POCT PREGNANCY, URINE  URINE CYTOLOGY ANCILLARY ONLY    EKG None Radiology No results found.  Procedures Procedures (including critical care time)  Medications Ordered in UC Medications - No data to display   Initial Impression / Assessment and Plan / UC Course  I have reviewed the triage vital signs and the nursing notes.  Pertinent labs & imaging results that were available during my care of the patient were reviewed by me and considered in my medical decision making (see chart for details).    Discussed with patient history and exam most consistent with viral URI. Symptomatic treatment as needed. Push fluids. Return precautions given.   Patient was treated empirically for yeast. Start diflucan as directed. Cytology sent, patient will be contacted with any positive results that require additional treatment. Patient to refrain from sexual activity for the next 7 days.   Urine with small leukocytes, will send for culture. Patient would like to be treated for UTI as well as "tonsillitis", will start patient on keflex. Patient became agitated, stating we are not helping her due to her being homeless as she cannot pay for prescription. Good rx coupon provided. Patient stable, ambulating without problems, nontoxic in appearance, in no acute distress.    Final Clinical Impressions(s) / UC  Diagnoses   Final diagnoses:  Viral illness  Vaginal itching    ED Discharge Orders        Ordered    cephALEXin (KEFLEX) 500 MG capsule  4 times daily     04/27/17 1314    fluconazole (DIFLUCAN) 150 MG tablet  Daily,   Status:  Discontinued     04/27/17 1314    fluticasone (FLONASE) 50 MCG/ACT nasal spray  Daily     04/27/17 1314    benzonatate (TESSALON) 100 MG capsule  Every 8 hours     04/27/17 1314    fluconazole (DIFLUCAN) 150 MG tablet  Daily     04/27/17 1330       Belinda Fisher, [place] 04/27/17 1339

## 2017-04-27 NOTE — ED Triage Notes (Signed)
Pt sts sore throat and URI sx; pt sts thinks she has yeast infection also

## 2017-04-27 NOTE — Discharge Instructions (Signed)
Rapid strep negative. Symptoms most likely due to viral illness. Tessalon for cough. Start flonase for nasal congestion/drainage. You can take over the counter zyrtec to help with the symptoms as well. You can use over the counter nasal saline rinse such as neti pot for nasal congestion. Keep hydrated, your urine should be clear to pale yellow in color. Tylenol/motrin for fever and pain. Monitor for any worsening of symptoms, chest pain, shortness of breath, wheezing, swelling of the throat, follow up for reevaluation.   For sore throat try using a honey-based tea. Use 3 teaspoons of honey with juice squeezed from half lemon. Place shaved pieces of ginger into 1/2-1 cup of water and warm over stove top. Then mix the ingredients and repeat every 4 hours as needed.  Urine had some bacteria in it that could indicate urinary tract infection, given your symptoms, I am covering you for an UTI. Start keflex as directed. Diflucan as directed for yeast infection. Cytology sent, you will be contacted with any positive results that requires further treatment. Refrain from sexual activity and alcohol use for the next 7 days. Monitor for any worsening of symptoms, fever, abdominal pain, nausea, vomiting, to follow up for reevaluation.

## 2017-04-28 LAB — URINE CULTURE: Culture: <10000 — AB

## 2017-04-28 LAB — URINE CYTOLOGY ANCILLARY ONLY
CHLAMYDIA, DNA PROBE: NEGATIVE
Neisseria Gonorrhea: NEGATIVE
Trichomonas: NEGATIVE

## 2017-04-29 ENCOUNTER — Telehealth (HOSPITAL_COMMUNITY): Payer: Self-pay

## 2017-04-29 LAB — URINE CYTOLOGY ANCILLARY ONLY
BACTERIAL VAGINITIS: NEGATIVE
CANDIDA VAGINITIS: NEGATIVE

## 2017-04-29 LAB — CULTURE, GROUP A STREP (THRC)

## 2017-04-29 NOTE — Telephone Encounter (Signed)
Attempted to contact patient regarding urine culture not clearly demonstrate a UTI.  Need to educated on other possible causes of urinary discomfort include chafing; irritation from hygiene product; other pelvic infection (yeast, bacterial vaginosis) or STD; occasional dietary cause (caffeine); bowel issue (constipation); low estrogen effect; kidney stone passage; or interstitial cystitis.  Voicemail left encouraging a call back.

## 2017-05-07 ENCOUNTER — Other Ambulatory Visit: Payer: Self-pay

## 2017-05-07 ENCOUNTER — Emergency Department (HOSPITAL_COMMUNITY)
Admission: EM | Admit: 2017-05-07 | Discharge: 2017-05-07 | Disposition: A | Discharge status: Home / Self Care | Payer: Self-pay | Attending: Emergency Medicine | Admitting: Emergency Medicine

## 2017-05-07 DIAGNOSIS — Z5321 Procedure and treatment not carried out due to patient leaving prior to being seen by health care provider: Secondary | ICD-10-CM | POA: Insufficient documentation

## 2017-05-07 DIAGNOSIS — Z0289 Encounter for other administrative examinations: Secondary | ICD-10-CM | POA: Insufficient documentation

## 2017-05-07 NOTE — ED Notes (Signed)
Pt has decided to leave states she has stuff to do. Pt states she will be back later today.

## 2017-05-31 ENCOUNTER — Ambulatory Visit (HOSPITAL_COMMUNITY)
Admission: EM | Admit: 2017-05-31 | Discharge: 2017-05-31 | Disposition: A | Discharge status: Home / Self Care | Payer: Self-pay | Attending: Family Medicine | Admitting: Family Medicine

## 2017-05-31 ENCOUNTER — Other Ambulatory Visit: Payer: Self-pay

## 2017-05-31 ENCOUNTER — Encounter (HOSPITAL_COMMUNITY): Payer: Self-pay | Admitting: Family Medicine

## 2017-05-31 DIAGNOSIS — Z202 Contact with and (suspected) exposure to infections with a predominantly sexual mode of transmission: Secondary | ICD-10-CM | POA: Insufficient documentation

## 2017-05-31 DIAGNOSIS — Z113 Encounter for screening for infections with a predominantly sexual mode of transmission: Secondary | ICD-10-CM

## 2017-05-31 DIAGNOSIS — Z8249 Family history of ischemic heart disease and other diseases of the circulatory system: Secondary | ICD-10-CM | POA: Insufficient documentation

## 2017-05-31 DIAGNOSIS — F17210 Nicotine dependence, cigarettes, uncomplicated: Secondary | ICD-10-CM | POA: Insufficient documentation

## 2017-05-31 DIAGNOSIS — Z881 Allergy status to other antibiotic agents status: Secondary | ICD-10-CM | POA: Insufficient documentation

## 2017-05-31 DIAGNOSIS — J4521 Mild intermittent asthma with (acute) exacerbation: Secondary | ICD-10-CM | POA: Insufficient documentation

## 2017-05-31 LAB — POCT URINALYSIS DIP (DEVICE)
BILIRUBIN URINE: NEGATIVE
Glucose, UA: NEGATIVE mg/dL
HGB URINE DIPSTICK: NEGATIVE
KETONES UR: NEGATIVE mg/dL
Nitrite: NEGATIVE
PH: 5.5 (ref 5.0–8.0)
PROTEIN: NEGATIVE mg/dL
Urobilinogen, UA: 0.2 mg/dL (ref 0.0–1.0)

## 2017-05-31 MED ORDER — CEFTRIAXONE SODIUM 250 MG IJ SOLR
250.0000 mg | Freq: Once | INTRAMUSCULAR | Status: AC
  Administered 2017-05-31: 250 mg via INTRAMUSCULAR

## 2017-05-31 MED ORDER — ALBUTEROL SULFATE HFA 108 (90 BASE) MCG/ACT IN AERS
2.0000 | INHALATION_SPRAY | Freq: Once | RESPIRATORY_TRACT | Status: AC
  Administered 2017-05-31: 2 via RESPIRATORY_TRACT

## 2017-05-31 MED ORDER — ALBUTEROL SULFATE HFA 108 (90 BASE) MCG/ACT IN AERS
INHALATION_SPRAY | RESPIRATORY_TRACT | Status: AC
  Filled 2017-05-31: qty 6.7

## 2017-05-31 MED ORDER — AZITHROMYCIN 250 MG PO TABS
1000.0000 mg | ORAL_TABLET | Freq: Once | ORAL | Status: AC
  Administered 2017-05-31: 1000 mg via ORAL

## 2017-05-31 MED ORDER — CEFTRIAXONE SODIUM 250 MG IJ SOLR
INTRAMUSCULAR | Status: AC
  Filled 2017-05-31: qty 250

## 2017-05-31 MED ORDER — AZITHROMYCIN 250 MG PO TABS
ORAL_TABLET | ORAL | Status: AC
  Filled 2017-05-31: qty 4

## 2017-05-31 NOTE — Discharge Instructions (Addendum)
Giving up smoking will help your breathing  We are running several tests and will call you with the results when they are available.

## 2017-05-31 NOTE — ED Triage Notes (Signed)
Patient presents to [facility] for exposure to STD aand also complains of possible sinus infection
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
## 2017-06-01 LAB — URINE CYTOLOGY ANCILLARY ONLY
Chlamydia: NEGATIVE
Neisseria Gonorrhea: NEGATIVE
Trichomonas: POSITIVE — AB

## 2017-06-02 LAB — URINE CULTURE

## 2017-06-03 ENCOUNTER — Telehealth (HOSPITAL_COMMUNITY): Payer: Self-pay

## 2017-06-03 LAB — URINE CYTOLOGY ANCILLARY ONLY: Candida vaginitis: NEGATIVE

## 2017-06-03 MED ORDER — METRONIDAZOLE 500 MG PO TABS: 500.0000 mg | ORAL_TABLET | Freq: Two times a day (BID) | ORAL | 0 refills | Status: DC

## 2017-06-03 NOTE — Telephone Encounter (Signed)
Trichomonas is positive. Rx metronidazole  bid x 7d #14 no refills was sent to the pharmacy of record. Attempted to call patient, no answer and no voicemail set up. Need to educate patient to refrain from sexual intercourse for 7 days to give the medicine time to work. Sexual partners need to be notified and tested/treated. Condoms may reduce risk of reinfection.  Recheck for further evaluation if symptoms are not improving. Pt verbalized understanding.  Also, urine culture does not suggest uti.

## 2017-06-06 ENCOUNTER — Telehealth (HOSPITAL_COMMUNITY): Payer: Self-pay

## 2017-06-06 NOTE — Telephone Encounter (Signed)
Attempted to reach patient x 2 regarding positive results and need for treatment. No answer and no voicemail available. Message sent to MyChart.

## 2017-06-17 ENCOUNTER — Encounter (HOSPITAL_COMMUNITY): Payer: Self-pay

## 2017-06-17 ENCOUNTER — Emergency Department (HOSPITAL_COMMUNITY)
Admission: EM | Admit: 2017-06-17 | Discharge: 2017-06-18 | Disposition: A | Discharge status: Home / Self Care | Payer: Self-pay | Attending: Emergency Medicine | Admitting: Emergency Medicine

## 2017-06-17 ENCOUNTER — Other Ambulatory Visit: Payer: Self-pay

## 2017-06-17 ENCOUNTER — Emergency Department (HOSPITAL_COMMUNITY)
Admission: EM | Admit: 2017-06-17 | Discharge: 2017-06-17 | Disposition: A | Discharge status: Home / Self Care | Payer: Self-pay | Attending: Emergency Medicine | Admitting: Emergency Medicine

## 2017-06-17 DIAGNOSIS — A599 Trichomoniasis, unspecified: Secondary | ICD-10-CM | POA: Insufficient documentation

## 2017-06-17 DIAGNOSIS — S29011A Strain of muscle and tendon of front wall of thorax, initial encounter: Secondary | ICD-10-CM | POA: Insufficient documentation

## 2017-06-17 DIAGNOSIS — B9689 Other specified bacterial agents as the cause of diseases classified elsewhere: Secondary | ICD-10-CM

## 2017-06-17 DIAGNOSIS — F17210 Nicotine dependence, cigarettes, uncomplicated: Secondary | ICD-10-CM | POA: Insufficient documentation

## 2017-06-17 DIAGNOSIS — Z79899 Other long term (current) drug therapy: Secondary | ICD-10-CM | POA: Insufficient documentation

## 2017-06-17 DIAGNOSIS — Y9289 Other specified places as the place of occurrence of the external cause: Secondary | ICD-10-CM | POA: Insufficient documentation

## 2017-06-17 DIAGNOSIS — Y999 Unspecified external cause status: Secondary | ICD-10-CM | POA: Insufficient documentation

## 2017-06-17 DIAGNOSIS — Y9389 Activity, other specified: Secondary | ICD-10-CM | POA: Insufficient documentation

## 2017-06-17 DIAGNOSIS — R1030 Lower abdominal pain, unspecified: Secondary | ICD-10-CM | POA: Insufficient documentation

## 2017-06-17 DIAGNOSIS — N760 Acute vaginitis: Secondary | ICD-10-CM | POA: Insufficient documentation

## 2017-06-17 DIAGNOSIS — M25511 Pain in right shoulder: Secondary | ICD-10-CM | POA: Insufficient documentation

## 2017-06-17 DIAGNOSIS — N898 Other specified noninflammatory disorders of vagina: Secondary | ICD-10-CM | POA: Insufficient documentation

## 2017-06-17 DIAGNOSIS — X509XXA Other and unspecified overexertion or strenuous movements or postures, initial encounter: Secondary | ICD-10-CM | POA: Insufficient documentation

## 2017-06-17 DIAGNOSIS — T148XXA Other injury of unspecified body region, initial encounter: Secondary | ICD-10-CM

## 2017-06-17 LAB — URINALYSIS, ROUTINE W REFLEX MICROSCOPIC
Glucose, UA: NEGATIVE mg/dL
KETONES UR: NEGATIVE mg/dL
NITRITE: NEGATIVE
PH: 5.5 (ref 5.0–8.0)
PROTEIN: 30 mg/dL — AB

## 2017-06-17 LAB — URINALYSIS, MICROSCOPIC (REFLEX)

## 2017-06-17 LAB — RAPID HIV SCREEN (HIV 1/2 AB+AG)
HIV 1/2 ANTIBODIES: NONREACTIVE
HIV-1 P24 ANTIGEN - HIV24: NONREACTIVE

## 2017-06-17 LAB — WET PREP, GENITAL
Sperm: NONE SEEN
Yeast Wet Prep HPF POC: NONE SEEN

## 2017-06-17 LAB — POC URINE PREG, ED: Preg Test, Ur: NEGATIVE

## 2017-06-17 MED ORDER — OXYCODONE-ACETAMINOPHEN 5-325 MG PO TABS
1.0000 | ORAL_TABLET | Freq: Once | ORAL | Status: AC
  Administered 2017-06-17: 1 via ORAL
  Filled 2017-06-17: qty 1

## 2017-06-17 MED ORDER — METRONIDAZOLE 500 MG PO TABS
2000.0000 mg | ORAL_TABLET | Freq: Once | ORAL | Status: AC
  Administered 2017-06-17: 2000 mg via ORAL
  Filled 2017-06-17: qty 4

## 2017-06-17 MED ORDER — METRONIDAZOLE 500 MG PO TABS: 500.0000 mg | ORAL_TABLET | Freq: Two times a day (BID) | ORAL | 0 refills | Status: DC

## 2017-06-17 MED ORDER — FLUCONAZOLE 150 MG PO TABS: 150.0000 mg | ORAL_TABLET | Freq: Every day | ORAL | 0 refills | Status: AC

## 2017-06-17 NOTE — ED Notes (Signed)
EDP at bedside  

## 2017-06-17 NOTE — ED Notes (Signed)
PA-C at bedside 

## 2017-06-17 NOTE — ED Triage Notes (Signed)
Pt states she has "female issues" including itching, foul smell, and burning with urination. Pt states she also has a knot on her right flank area that popped up a couple days ago.

## 2017-06-17 NOTE — ED Provider Notes (Signed)
[facility] EMERGENCY DEPARTMENT Provider Note   [name]: [id number] Arrival date & time: 06/17/17  0909     History   Chief Complaint Chief Complaint  Patient presents with  . Vaginal Itching    HPI Erica [name] is a 37 y.o. female.  HPI   Erica [name] is a 37yo female with a history of anxiety and depression who presents to the emergency department for evaluation of vaginal itching, odor, discharge and pain.  She reports that her symptoms started about a week ago.  States that she has white vaginal discharge which is malodorous and smells fishy.  Reports that she also has stabbing sensation in her private area which is 8/10 in severity.  Also reports burning sensation with urination.  She was recently exposed to trichomonas from a female sexual partner whom she did not use a condom.  States that she was seen at the urgent care for STD exposure.  Per chart review, she was treated with azithromycin and Rocephin.  She had testing which was positive for trichomonas, but the office could not get in touch with her to prescribe her the metronidazole she needed.  Patient states that she now has a new sexual partner.  States that she is homeless and has no money to afford prescription medication.  States that her last menstrual period completed 2 days ago.  Also reports she has a "knot" over her right flank which is been present for a couple of days, but has improved.  She denies pain over the area.  She denies fevers, chills, nausea/vomiting, abdominal pain, diarrhea, hematuria, urinary frequency, hematochezia, melena, shortness of breath, chest pain, lightheadedness, syncope.  States that she does not have a PCP due to cost issues.  Past Medical History:  Diagnosis Date  . Abnormal Pap smear   . Anxiety    as teen  . Anxiety   . Depression    as teen  . GERD (gastroesophageal reflux disease)   . Headache(784.0)   . No pertinent past medical history   . VBAC, delivered, current  hospitalization 05/19/2011    Patient Active Problem List   Diagnosis Date Noted  . Acute appendicitis 07/11/2016  . URI (upper respiratory infection) 09/23/2010  . Right otitis media 09/23/2010  . Positive pregnancy test 09/23/2010    Past Surgical History:  Procedure Laterality Date  . APPENDECTOMY    . CESAREAN SECTION    . COLPOSCOPY    . DILATION AND CURETTAGE OF UTERUS  2010  . LAPAROSCOPIC APPENDECTOMY N/A 07/11/2016   Procedure: APPENDECTOMY LAPAROSCOPIC;  Surgeon: Ricarda Frame, MD;  Location: [facility] ORS;  Service: General;  Laterality: N/A;  . WISDOM TOOTH EXTRACTION       OB History    Gravida  7   Para  6   Term  4   Preterm  2   AB  1   Living  6     SAB  1   TAB  0   Ectopic  0   Multiple  0   Live Births  6            Home Medications    Prior to Admission medications   Medication Sig Start Date End Date Taking? Authorizing Provider  benzonatate (TESSALON) 100 MG capsule Take 1 capsule (100 mg total) by mouth every 8 (eight) hours. 04/27/17   Cathie Hoops, Amy V, PA-C  fluconazole (DIFLUCAN) 150 MG tablet Take 1 tablet (150 mg total) by mouth daily.  04/27/17   Cathie Hoops, Amy V, PA-C  fluticasone (FLONASE) 50 MCG/ACT nasal spray Place 2 sprays into both nostrils daily. 04/27/17   Cathie Hoops, Amy V, PA-C  metroNIDAZOLE (FLAGYL) 500 MG tablet Take 1 tablet (500 mg total) by mouth 2 (two) times daily. 06/03/17   Isa Rankin, MD    Family History Family History  Problem Relation Age of Onset  . Hypertension Mother   . Seizures Mother   . Hypertension Maternal Grandmother   . Arthritis Maternal Grandmother   . Cancer Maternal Grandmother        breast  . Stroke Maternal Grandfather   . Seizures Brother   . Mental illness Brother        paranoid schizophrenia  . Birth defects Daughter        hole in heart  . Seizures Maternal Aunt   . Seizures Brother   . Seizures Cousin   . Seizures Cousin   . Mental retardation Cousin     Social History Social  History   Tobacco Use  . Smoking status: Current Every Day Smoker    Packs/day: 1.00    Years: 13.00    Pack years: 13.00    Types: Cigarettes  . Smokeless tobacco: Never Used  Substance Use Topics  . Alcohol use: Yes    Comment: rare  . Drug use: Yes    Types: Cocaine    Comment: last used 3 weeks ago     Allergies   Levaquin [levofloxacin in d5w]   Review of Systems Review of Systems  Constitutional: Negative for fever.  HENT: Negative for sore throat.   Respiratory: Negative for shortness of breath.   Cardiovascular: Negative for chest pain.  Gastrointestinal: Negative for abdominal pain, blood in stool, diarrhea, nausea and vomiting.  Genitourinary: Positive for dysuria, vaginal discharge and vaginal pain. Negative for difficulty urinating, frequency, genital sores, hematuria and menstrual problem.  Musculoskeletal: Negative for gait problem.  Skin: Negative for rash.  Neurological: Negative for syncope and light-headedness.  Psychiatric/Behavioral: Negative for agitation.     Physical Exam Updated Vital Signs BP 106/82 (BP Location: Right Arm)   Pulse 71   Temp 98 F (36.7 C) (Oral)   Resp 16   LMP 05/21/2017 (Exact Date)   SpO2 96%   Physical Exam  Constitutional: She is oriented to person, place, and time. She appears well-developed and well-nourished. No distress.  Sitting at bedside in no apparent distress, nontoxic-appearing.  HENT:  Head: Normocephalic and atraumatic.  Mouth/Throat: Oropharynx is clear and moist. No oropharyngeal exudate.  Eyes: Pupils are equal, round, and reactive to light. Conjunctivae are normal. Right eye exhibits no discharge. Left eye exhibits no discharge.  Neck: Normal range of motion. Neck supple.  Cardiovascular: Normal rate, regular rhythm and intact distal pulses. Exam reveals no friction rub.  No murmur heard. Pulmonary/Chest: Effort normal and breath sounds normal. No stridor. No respiratory distress. She has no  wheezes.  Abdominal: Soft. Bowel sounds are normal. There is no tenderness. There is no guarding.  Abdomen soft and nondistended.  Nontender to palpation.  No palpable mass over the right flank.  No CVA tenderness.  Genitourinary:  Genitourinary Comments: Chaperone present for exam. White milky discharge present in the vaginal vault. No CMT. No adnexal masses, tenderness, or fullness.  No bleeding within vaginal vault.  Lymphadenopathy:    She has no cervical adenopathy.  Neurological: She is alert and oriented to person, place, and time. Coordination normal.  Skin: Skin is  warm and dry. She is not diaphoretic.  Psychiatric: She has a normal mood and affect. Her behavior is normal.  Nursing note and vitals reviewed.    ED Treatments / Results  Labs (all labs ordered are listed, but only abnormal results are displayed) Labs Reviewed  WET PREP, GENITAL - Abnormal; Notable for the following components:      Result Value   Trich, Wet Prep PRESENT (*)    Clue Cells Wet Prep HPF POC PRESENT (*)    WBC, Wet Prep HPF POC MANY (*)    All other components within normal limits  URINALYSIS, ROUTINE W REFLEX MICROSCOPIC - Abnormal; Notable for the following components:   APPearance HAZY (*)    Specific Gravity, Urine >1.030 (*)    Hgb urine dipstick SMALL (*)    Bilirubin Urine SMALL (*)    Protein, ur 30 (*)    Leukocytes, UA TRACE (*)    All other components within normal limits  URINALYSIS, MICROSCOPIC (REFLEX) - Abnormal; Notable for the following components:   Bacteria, UA MANY (*)    All other components within normal limits  RAPID HIV SCREEN (HIV 1/2 AB+AG)  RPR  POC URINE PREG, ED  GC/CHLAMYDIA PROBE AMP ([place]) NOT AT [facility]    EKG None  Radiology No results found.  Procedures Procedures (including critical care time)  Medications Ordered in ED Medications  oxyCODONE-acetaminophen (PERCOCET/ROXICET) 5-325 MG per tablet 1 tablet (1 tablet Oral Given 06/17/17 1030)    oxyCODONE-acetaminophen (PERCOCET/ROXICET) 5-325 MG per tablet 1 tablet (1 tablet Oral Given 06/17/17 1150)  metroNIDAZOLE (FLAGYL) tablet 2,000 mg (2,000 mg Oral Given 06/17/17 1418)     Initial Impression / Assessment and Plan / ED Course  I have reviewed the triage vital signs and the nursing notes.  Pertinent labs & imaging results that were available during my care of the patient were reviewed by me and considered in my medical decision making (see chart for details).  Pt presents to the ED with vaginal d/c, odor and itching. Wet prep reveals trich and clue cells. No CMT or adnexal tenderness to suggest PID. Discussed with patient that she can take flagyl BID x 7d to treat both BV and trich, although she states she is homeless and prefers to be treated with one time dose of 2,000mg  Flagyl today. States that she can pick up antibiotic prescription for BV from the pharmacy later this month when she has adequate funds. Attempted to contact case manager at [facility] for medication assistance, unfortunately they are not here today. Discussed importance of avoiding alcohol while taking flagyl. Have discussed with her that she will need to inform all sexual partners that they will need to go to the health department to be treated for trichomonas. She agrees and voices understanding.   Urine pregnancy and HIV negative. UA without infection. She was treated for gc/chlamydia earlier this month at the urgent care. I have counseled her that she has GC/Chlamydia and Syphilis testing which is pending and that she will need to go to the health department to be treated if positive. Counseled her on return precautions. Have also given her diflucan per her request given she reports getting yeast infections with antibiotic use.  Discussed importance of using protection when sexually active.   Final Clinical Impressions(s) / ED Diagnoses   Final diagnoses:  Trichomoniasis  BV (bacterial vaginosis)    ED Discharge  Orders        Ordered    metroNIDAZOLE (FLAGYL)  500 MG tablet  2 times daily     06/17/17 1406    fluconazole (DIFLUCAN) 150 MG tablet  Daily     06/17/17 1437       Kellie Shropshire, PA-C 06/18/17 2215    Doug Sou, MD 06/19/17 [phone]

## 2017-06-17 NOTE — ED Notes (Signed)
Patient requested to speak with female regarding assessment. See provider note for assessment.
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
## 2017-06-17 NOTE — ED Notes (Signed)
Signature pad not available at time of discharge. Patient verbalized understanding of discharge instructions and denies any further needs or questions at this time. VS stable. Patient ambulatory with steady gait.

## 2017-06-17 NOTE — Discharge Instructions (Addendum)
You were treated for trichomonas in the emergency department.  You also have bacterial vaginosis.  Please take 500 mg twice a day for 7 days to treat this.  Inform all sexual partners that they will need to be treated for trichomonas regardless of if they have symptoms or not.  Return to the emergency department if you have any new or concerning symptoms like fever greater than 100.4 F, vomiting, abdominal pain.

## 2017-06-18 ENCOUNTER — Emergency Department (HOSPITAL_COMMUNITY): Payer: Self-pay

## 2017-06-18 ENCOUNTER — Encounter (HOSPITAL_COMMUNITY): Payer: Self-pay | Admitting: Emergency Medicine

## 2017-06-18 ENCOUNTER — Emergency Department (HOSPITAL_COMMUNITY)
Admission: EM | Admit: 2017-06-18 | Discharge: 2017-06-18 | Disposition: A | Discharge status: Home / Self Care | Payer: Self-pay | Attending: Emergency Medicine | Admitting: Emergency Medicine

## 2017-06-18 ENCOUNTER — Other Ambulatory Visit: Payer: Self-pay

## 2017-06-18 ENCOUNTER — Encounter (HOSPITAL_COMMUNITY): Payer: Self-pay | Admitting: *Deleted

## 2017-06-18 DIAGNOSIS — B009 Herpesviral infection, unspecified: Secondary | ICD-10-CM | POA: Insufficient documentation

## 2017-06-18 DIAGNOSIS — Z79899 Other long term (current) drug therapy: Secondary | ICD-10-CM | POA: Insufficient documentation

## 2017-06-18 LAB — RPR: RPR Ser Ql: NONREACTIVE

## 2017-06-18 MED ORDER — IBUPROFEN 400 MG PO TABS
600.0000 mg | ORAL_TABLET | Freq: Once | ORAL | Status: AC
  Administered 2017-06-18: 600 mg via ORAL
  Filled 2017-06-18: qty 1

## 2017-06-18 MED ORDER — LIDOCAINE 5 % EX PTCH: 1.0000 | MEDICATED_PATCH | CUTANEOUS | Status: DC

## 2017-06-18 MED ORDER — ACYCLOVIR 200 MG PO CAPS
400.0000 mg | ORAL_CAPSULE | Freq: Once | ORAL | Status: AC
  Administered 2017-06-18: 400 mg via ORAL
  Filled 2017-06-18: qty 2

## 2017-06-18 MED ORDER — ACETAMINOPHEN 325 MG PO TABS
650.0000 mg | ORAL_TABLET | Freq: Once | ORAL | Status: DC
  Filled 2017-06-18: qty 2

## 2017-06-18 MED ORDER — ACYCLOVIR 400 MG PO TABS: 400.0000 mg | ORAL_TABLET | Freq: Three times a day (TID) | ORAL | 0 refills | Status: AC

## 2017-06-18 MED ORDER — LIDOCAINE 5 % EX PTCH: 1.0000 | MEDICATED_PATCH | CUTANEOUS | 0 refills | Status: DC

## 2017-06-18 MED ORDER — KETOROLAC TROMETHAMINE 30 MG/ML IJ SOLN: 30.0000 mg | Freq: Once | INTRAMUSCULAR | Status: DC

## 2017-06-18 MED ORDER — IBUPROFEN 800 MG PO TABS
800.0000 mg | ORAL_TABLET | Freq: Once | ORAL | Status: AC
  Administered 2017-06-18: 800 mg via ORAL
  Filled 2017-06-18: qty 1

## 2017-06-18 NOTE — ED Notes (Signed)
Called pt to reassess. No answer 

## 2017-06-18 NOTE — Discharge Instructions (Signed)
Take acyclovir as prescribed. Follow-up with health department as needed for continued symptoms or any further concerns. Return to the emergency room if you develop high fevers, abdominal pain, or any new or concerning symptoms.
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
## 2017-06-18 NOTE — ED Triage Notes (Signed)
Pt was seen here yesterday for std symptoms and treated for trich. Pt reports still having symptoms and now has bumps to vaginal area.

## 2017-06-18 NOTE — ED Notes (Signed)
Declined W/C at D/C and was escorted to lobby by RN.Declined W/C at D/C and was escorted to lobby by RN. 

## 2017-06-18 NOTE — ED Notes (Signed)
Pt called this RN back to room, pt now reports she would like to be rechecked for lower mid abld pain and "bumps" on vagina. Pt reports she was checked for same earlier.

## 2017-06-18 NOTE — ED Notes (Signed)
Pt irritated about wait time and requests Ibuprofen for pain. This tech explained delay and is following up with triage nurse about medication request.

## 2017-06-21 LAB — GC/CHLAMYDIA PROBE AMP (~~LOC~~) NOT AT ARMC
CHLAMYDIA, DNA PROBE: NEGATIVE
Neisseria Gonorrhea: NEGATIVE

## 2017-07-06 ENCOUNTER — Ambulatory Visit (HOSPITAL_COMMUNITY)
Admission: EM | Admit: 2017-07-06 | Discharge: 2017-07-06 | Disposition: A | Discharge status: Home / Self Care | Payer: Self-pay | Attending: Family Medicine | Admitting: Family Medicine

## 2017-07-06 ENCOUNTER — Encounter (HOSPITAL_COMMUNITY): Payer: Self-pay | Admitting: Emergency Medicine

## 2017-07-06 DIAGNOSIS — Z79899 Other long term (current) drug therapy: Secondary | ICD-10-CM | POA: Insufficient documentation

## 2017-07-06 DIAGNOSIS — F329 Major depressive disorder, single episode, unspecified: Secondary | ICD-10-CM | POA: Insufficient documentation

## 2017-07-06 DIAGNOSIS — Z8249 Family history of ischemic heart disease and other diseases of the circulatory system: Secondary | ICD-10-CM | POA: Insufficient documentation

## 2017-07-06 DIAGNOSIS — Z803 Family history of malignant neoplasm of breast: Secondary | ICD-10-CM | POA: Insufficient documentation

## 2017-07-06 DIAGNOSIS — Z881 Allergy status to other antibiotic agents status: Secondary | ICD-10-CM | POA: Insufficient documentation

## 2017-07-06 DIAGNOSIS — R300 Dysuria: Secondary | ICD-10-CM | POA: Insufficient documentation

## 2017-07-06 DIAGNOSIS — K219 Gastro-esophageal reflux disease without esophagitis: Secondary | ICD-10-CM | POA: Insufficient documentation

## 2017-07-06 DIAGNOSIS — N898 Other specified noninflammatory disorders of vagina: Secondary | ICD-10-CM | POA: Insufficient documentation

## 2017-07-06 DIAGNOSIS — Z820 Family history of epilepsy and other diseases of the nervous system: Secondary | ICD-10-CM | POA: Insufficient documentation

## 2017-07-06 DIAGNOSIS — F17210 Nicotine dependence, cigarettes, uncomplicated: Secondary | ICD-10-CM | POA: Insufficient documentation

## 2017-07-06 DIAGNOSIS — Z818 Family history of other mental and behavioral disorders: Secondary | ICD-10-CM | POA: Insufficient documentation

## 2017-07-06 DIAGNOSIS — Z9889 Other specified postprocedural states: Secondary | ICD-10-CM | POA: Insufficient documentation

## 2017-07-06 DIAGNOSIS — Z8261 Family history of arthritis: Secondary | ICD-10-CM | POA: Insufficient documentation

## 2017-07-06 DIAGNOSIS — F419 Anxiety disorder, unspecified: Secondary | ICD-10-CM | POA: Insufficient documentation

## 2017-07-06 LAB — POCT URINALYSIS DIP (DEVICE)
BILIRUBIN URINE: NEGATIVE
GLUCOSE, UA: NEGATIVE mg/dL
Hgb urine dipstick: NEGATIVE
KETONES UR: NEGATIVE mg/dL
NITRITE: NEGATIVE
PH: 6.5 (ref 5.0–8.0)
PROTEIN: NEGATIVE mg/dL
Specific Gravity, Urine: 1.025 (ref 1.005–1.030)
Urobilinogen, UA: 0.2 mg/dL (ref 0.0–1.0)

## 2017-07-06 LAB — POCT PREGNANCY, URINE: Preg Test, Ur: NEGATIVE

## 2017-07-06 MED ORDER — METRONIDAZOLE 500 MG PO TABS
2000.0000 mg | ORAL_TABLET | Freq: Once | ORAL | Status: AC
  Administered 2017-07-06: 2000 mg via ORAL

## 2017-07-06 MED ORDER — FLUCONAZOLE 150 MG PO TABS: 150.0000 mg | ORAL_TABLET | Freq: Once | ORAL | 0 refills | Status: AC

## 2017-07-06 MED ORDER — METRONIDAZOLE 500 MG PO TABS
ORAL_TABLET | ORAL | Status: AC
  Filled 2017-07-06: qty 4

## 2017-07-06 NOTE — ED Triage Notes (Signed)
Pt c/o vaginal discharge 

## 2017-07-06 NOTE — Discharge Instructions (Addendum)
We treated you for trichomonas today with metronidazole.  Please do not drink alcohol for the next 2 days.   We will send the urine off to check for other STDs as well as yeast and Bv.  These results come back in a few days, we will call you to let you know of any positive results and need for further treatment.  I have sent in Diflucan to treat a yeast infection, please take 1 tablet once.

## 2017-07-06 NOTE — ED Provider Notes (Signed)
[facility]    [name]: [id number] Arrival date & time: 07/06/17  1421     History   Chief Complaint Chief Complaint  Patient presents with  . Vaginal Discharge    HPI Erica [name] is a 37 y.o. female history of substance abuse presenting today for evaluation of vaginal discharge.  Patient states that she recently tested positive for trichomonas, and was treated in the emergency room, has had intercourse with the person she believes that she contracted it from since then.  Her symptoms did improve but have returned.  Symptoms began 2 days ago.  Also noting significant itching, dysuria and swelling.  Denies fever, nausea, vomiting, abdominal pain.  Patient states that she does not have any money and is unable to pick up any medicines from the pharmacy.  HPI  Past Medical History:  Diagnosis Date  . Abnormal Pap smear   . Anxiety    as teen  . Anxiety   . Depression    as teen  . GERD (gastroesophageal reflux disease)   . Headache(784.0)   . No pertinent past medical history   . VBAC, delivered, current hospitalization 05/19/2011    Patient Active Problem List   Diagnosis Date Noted  . Acute appendicitis 07/11/2016  . URI (upper respiratory infection) 09/23/2010  . Right otitis media 09/23/2010  . Positive pregnancy test 09/23/2010    Past Surgical History:  Procedure Laterality Date  . APPENDECTOMY    . CESAREAN SECTION    . COLPOSCOPY    . DILATION AND CURETTAGE OF UTERUS  2010  . LAPAROSCOPIC APPENDECTOMY N/A 07/11/2016   Procedure: APPENDECTOMY LAPAROSCOPIC;  Surgeon: Ricarda FrameWoodham, Charles, MD;  Location: [facility] ORS;  Service: General;  Laterality: N/A;  . WISDOM TOOTH EXTRACTION      OB History    Gravida  7   Para  6   Term  4   Preterm  2   AB  1   Living  6     SAB  1   TAB  0   Ectopic  0   Multiple  0   Live Births  6            Home Medications    Prior to Admission medications   Medication Sig Start Date End Date Taking?  Authorizing Provider  benzonatate (TESSALON) 100 MG capsule Take 1 capsule (100 mg total) by mouth every 8 (eight) hours. 04/27/17   Cathie HoopsYu, Amy V, PA-C  fluconazole (DIFLUCAN) 150 MG tablet Take 1 tablet (150 mg total) by mouth once for 1 dose. 07/06/17 07/06/17  Wieters, Hallie C, PA-C  fluticasone (FLONASE) 50 MCG/ACT nasal spray Place 2 sprays into both nostrils daily. 04/27/17   Cathie HoopsYu, Amy V, PA-C  lidocaine (LIDODERM) 5 % Place 1 patch onto the skin daily. Remove & Discard patch within 12 hours or as directed by MD 06/18/17   Nicanor AlconPalumbo, April, MD  metroNIDAZOLE (FLAGYL) 500 MG tablet Take 1 tablet (500 mg total) by mouth 2 (two) times daily. 06/03/17   Isa RankinMurray, Laura Wilson, MD  metroNIDAZOLE (FLAGYL) 500 MG tablet Take 1 tablet (500 mg total) by mouth 2 (two) times daily. 06/17/17   Kellie ShropshireShrosbree, Emily J, PA-C    Family History Family History  Problem Relation Age of Onset  . Hypertension Mother   . Seizures Mother   . Hypertension Maternal Grandmother   . Arthritis Maternal Grandmother   . Cancer Maternal Grandmother        breast  .  Stroke Maternal Grandfather   . Seizures Brother   . Mental illness Brother        paranoid schizophrenia  . Birth defects Daughter        hole in heart  . Seizures Maternal Aunt   . Seizures Brother   . Seizures Cousin   . Seizures Cousin   . Mental retardation Cousin     Social History Social History   Tobacco Use  . Smoking status: Current Every Day Smoker    Packs/day: 1.00    Years: 13.00    Pack years: 13.00    Types: Cigarettes  . Smokeless tobacco: Never Used  Substance Use Topics  . Alcohol use: Yes    Comment: rare  . Drug use: Yes    Types: Cocaine    Comment: last used 3 weeks ago     Allergies   Levaquin [levofloxacin in d5w]   Review of Systems Review of Systems  Constitutional: Negative for fever.  Respiratory: Negative for shortness of breath.   Cardiovascular: Negative for chest pain.  Gastrointestinal: Negative for abdominal  pain, diarrhea, nausea and vomiting.  Genitourinary: Positive for dysuria and vaginal discharge. Negative for flank pain, genital sores, hematuria, menstrual problem, vaginal bleeding and vaginal pain.  Musculoskeletal: Negative for back pain.  Skin: Negative for rash.  Neurological: Negative for dizziness, light-headedness and headaches.     Physical Exam Triage Vital Signs ED Triage Vitals [07/06/17 1507]  Enc Vitals Group     BP 120/62     Pulse Rate 82     Resp 18     Temp 98.7 F (37.1 C)     Temp Source Oral     SpO2 100 %     Weight      Height      Head Circumference      Peak Flow      Pain Score      Pain Loc      Pain Edu?      Excl. in GC?    No data found.  Updated Vital Signs BP 120/62 (BP Location: Left Arm)   Pulse 82   Temp 98.7 F (37.1 C) (Oral)   Resp 18   SpO2 100%   Visual Acuity Right Eye Distance:   Left Eye Distance:   Bilateral Distance:    Right Eye Near:   Left Eye Near:    Bilateral Near:     Physical Exam  Constitutional: She appears well-developed and well-nourished. No distress.  HENT:  Head: Normocephalic and atraumatic.  Eyes: Conjunctivae are normal.  Neck: Neck supple.  Cardiovascular: Normal rate and regular rhythm.  No murmur heard. Pulmonary/Chest: Effort normal and breath sounds normal. No respiratory distress.  Abdominal: Soft. There is no tenderness.  Genitourinary:  Genitourinary Comments: Deferred  Musculoskeletal: She exhibits no edema.  Neurological: She is alert.  Skin: Skin is warm and dry.  Psychiatric: She has a normal mood and affect.  Nursing note and vitals reviewed.    UC Treatments / Results  Labs (all labs ordered are listed, but only abnormal results are displayed) Labs Reviewed  URINE CYTOLOGY ANCILLARY ONLY    EKG None  Radiology No results found.  Procedures Procedures (including critical care time)  Medications Ordered in UC Medications  metroNIDAZOLE (FLAGYL) tablet 2,000  mg (has no administration in time range)    Initial Impression / Assessment and Plan / UC Course  I have reviewed the triage vital signs and the nursing  notes.  Pertinent labs & imaging results that were available during my care of the patient were reviewed by me and considered in my medical decision making (see chart for details).     We will go ahead and treat for trichomonas with 2 g metronidazole, advised patient that if she has BV that she needs to have the weeklong course.  Patient states that she is unable to pick up any medicines as she does not even have a quarter.  Went ahead and sent in Diflucan also given significant itching, provided her with good Rx card if she is able to come up with the money.Discussed strict return precautions. Patient verbalized understanding and is agreeable with plan.  Final Clinical Impressions(s) / UC Diagnoses   Final diagnoses:  Vaginal discharge     Discharge Instructions     We treated you for trichomonas today with metronidazole.  Please do not drink alcohol for the next 2 days.   We will send the urine off to check for other STDs as well as yeast and Bv.  These results come back in a few days, we will call you to let you know of any positive results and need for further treatment.  I have sent in Diflucan to treat a yeast infection, please take 1 tablet once.   ED Prescriptions    Medication Sig Dispense Auth. Provider   fluconazole (DIFLUCAN) 150 MG tablet Take 1 tablet (150 mg total) by mouth once for 1 dose. 2 tablet Wieters, Hallie C, PA-C     Controlled Substance Prescriptions [place] Controlled Substance Registry consulted? Not Applicable   Lew Dawes, [place] 07/06/17 1541

## 2017-07-07 LAB — URINE CYTOLOGY ANCILLARY ONLY
Chlamydia: NEGATIVE
Neisseria Gonorrhea: NEGATIVE
Trichomonas: NEGATIVE

## 2017-07-11 LAB — URINE CYTOLOGY ANCILLARY ONLY
BACTERIAL VAGINITIS: POSITIVE — AB
CANDIDA VAGINITIS: POSITIVE — AB

## 2017-07-12 ENCOUNTER — Telehealth (HOSPITAL_COMMUNITY): Payer: Self-pay

## 2017-07-12 NOTE — Telephone Encounter (Signed)
Attempted to reach patient regarding positive bacterial and yeast infections. No answer and no voicemail has been set up.  The patient received treatment for yeast and may need additional treatment for bacterial vaginosis based on symptoms per Dorene GrebeNatalie NP

## 2017-08-29 ENCOUNTER — Emergency Department (HOSPITAL_COMMUNITY)
Admission: EM | Admit: 2017-08-29 | Discharge: 2017-08-29 | Disposition: A | Discharge status: Home / Self Care | Payer: Self-pay | Attending: Emergency Medicine | Admitting: Emergency Medicine

## 2017-08-29 ENCOUNTER — Encounter (HOSPITAL_COMMUNITY): Payer: Self-pay

## 2017-08-29 DIAGNOSIS — F1410 Cocaine abuse, uncomplicated: Secondary | ICD-10-CM | POA: Insufficient documentation

## 2017-08-29 DIAGNOSIS — Z5321 Procedure and treatment not carried out due to patient leaving prior to being seen by health care provider: Secondary | ICD-10-CM | POA: Insufficient documentation

## 2017-08-29 NOTE — ED Triage Notes (Addendum)
Pt presents with c/o request for detox from crack cocaine. Pt reports she also occasionally uses pills and alcohol. Pt reports she has done a significant amount of cocaine over the last few days. Pt reports she has been waking up drenched in sweat and is unsure if she has been doing too much cocaine. Pt reports she is feeling very anxious. Pt denies any SI/HI.

## 2017-08-29 NOTE — ED Notes (Signed)
Pt told registration that she was leaving.  

## 2017-10-13 ENCOUNTER — Emergency Department (HOSPITAL_COMMUNITY)
Admission: EM | Admit: 2017-10-13 | Discharge: 2017-10-13 | Disposition: A | Discharge status: Home / Self Care | Payer: Self-pay | Attending: Emergency Medicine | Admitting: Emergency Medicine

## 2017-10-13 ENCOUNTER — Encounter (HOSPITAL_COMMUNITY): Payer: Self-pay

## 2017-10-13 DIAGNOSIS — J069 Acute upper respiratory infection, unspecified: Secondary | ICD-10-CM | POA: Insufficient documentation

## 2017-10-13 DIAGNOSIS — N898 Other specified noninflammatory disorders of vagina: Secondary | ICD-10-CM | POA: Insufficient documentation

## 2017-10-13 LAB — WET PREP, GENITAL
SPERM: NONE SEEN
Trich, Wet Prep: NONE SEEN
Yeast Wet Prep HPF POC: NONE SEEN

## 2017-10-13 MED ORDER — METRONIDAZOLE 500 MG PO TABS
2000.0000 mg | ORAL_TABLET | Freq: Once | ORAL | Status: AC
  Administered 2017-10-13: 2000 mg via ORAL
  Filled 2017-10-13: qty 4

## 2017-10-13 MED ORDER — METRONIDAZOLE 500 MG PO TABS: 500.0000 mg | ORAL_TABLET | Freq: Two times a day (BID) | ORAL | 0 refills | Status: DC

## 2017-10-13 MED ORDER — DOXYCYCLINE HYCLATE 100 MG PO CAPS: 100.0000 mg | ORAL_CAPSULE | Freq: Two times a day (BID) | ORAL | 0 refills | Status: DC

## 2017-10-13 NOTE — ED Triage Notes (Signed)
Pt states that she has been having URI symptoms for two days, coughing sneezing, congestion. Pt reports vaginal discharge with foul odor for three days and frequent urination. And also wants detox from cocaine and alcohol, reports drinking several drinks every other day, reports homelessness, denies SI/HI/AVH

## 2017-10-13 NOTE — ED Notes (Signed)
PA made aware of pt not having insurance to get prescriptions.

## 2017-10-13 NOTE — ED Provider Notes (Signed)
[facility] EMERGENCY DEPARTMENT Provider Note   [name]: [id number] Arrival date & time: 10/13/17  0531     History   Chief Complaint Chief Complaint  Patient presents with  . URI  . Drug Problem    HPI Erica [name] is a 37 y.o. female.  The history is provided by the patient. No language interpreter was used.  URI   The current episode started more than 2 days ago. The problem has been gradually worsening. There has been no fever. Associated symptoms include congestion. Pertinent negatives include no nausea. She has tried nothing for the symptoms.  Drug Problem   Vaginal Discharge   This is a new problem. The current episode started 2 days ago. The problem occurs constantly. The problem has been gradually worsening. Pertinent negatives include no nausea and no frequency. She has tried nothing for the symptoms. The treatment provided no relief. Her past medical history does not include STD.  Pt complains of vaginal odor and she has had unprotected sex  Past Medical History:  Diagnosis Date  . Abnormal Pap smear   . Anxiety    as teen  . Anxiety   . Depression    as teen  . GERD (gastroesophageal reflux disease)   . Headache(784.0)   . No pertinent past medical history   . VBAC, delivered, current hospitalization 05/19/2011    Patient Active Problem List   Diagnosis Date Noted  . Acute appendicitis 07/11/2016  . URI (upper respiratory infection) 09/23/2010  . Right otitis media 09/23/2010  . Positive pregnancy test 09/23/2010    Past Surgical History:  Procedure Laterality Date  . APPENDECTOMY    . CESAREAN SECTION    . COLPOSCOPY    . DILATION AND CURETTAGE OF UTERUS  2010  . LAPAROSCOPIC APPENDECTOMY N/A 07/11/2016   Procedure: APPENDECTOMY LAPAROSCOPIC;  Surgeon: Ricarda Frame, MD;  Location: [facility] ORS;  Service: General;  Laterality: N/A;  . WISDOM TOOTH EXTRACTION       OB History    Gravida  7   Para  6   Term  4   Preterm  2     AB  1   Living  6     SAB  1   TAB  0   Ectopic  0   Multiple  0   Live Births  6            Home Medications    Prior to Admission medications   Medication Sig Start Date End Date Taking? Authorizing Provider  doxycycline (VIBRAMYCIN) 100 MG capsule Take 1 capsule (100 mg total) by mouth 2 (two) times daily. 10/13/17   Elson Areas, PA-C  metroNIDAZOLE (FLAGYL) 500 MG tablet Take 1 tablet (500 mg total) by mouth 2 (two) times daily. 10/13/17   Elson Areas, PA-C    Family History Family History  Problem Relation Age of Onset  . Hypertension Mother   . Seizures Mother   . Hypertension Maternal Grandmother   . Arthritis Maternal Grandmother   . Cancer Maternal Grandmother        breast  . Stroke Maternal Grandfather   . Seizures Brother   . Mental illness Brother        paranoid schizophrenia  . Birth defects Daughter        hole in heart  . Seizures Maternal Aunt   . Seizures Brother   . Seizures Cousin   . Seizures Cousin   . Mental  retardation Cousin     Social History Social History   Tobacco Use  . Smoking status: Current Every Day Smoker    Packs/day: 1.00    Years: 13.00    Pack years: 13.00    Types: Cigarettes  . Smokeless tobacco: Never Used  Substance Use Topics  . Alcohol use: Yes    Comment: rare  . Drug use: Yes    Types: Cocaine    Comment: last used 3 weeks ago     Allergies   Levaquin [levofloxacin in d5w]   Review of Systems Review of Systems  HENT: Positive for congestion.   Gastrointestinal: Negative for nausea.  Genitourinary: Positive for vaginal discharge. Negative for frequency.  All other systems reviewed and are negative.    Physical Exam Updated Vital Signs BP 122/77 (BP Location: Right Arm)   Pulse 90   Temp 97.8 F (36.6 C) (Oral)   Resp 19   Ht 5\' 3"  (1.6 m)   Wt 59.6 kg   SpO2 99%   BMI 23.26 kg/m   Physical Exam  Constitutional: She is oriented to person, place, and time. She appears  well-developed and well-nourished.  HENT:  Head: Normocephalic and atraumatic.  Mouth/Throat: Posterior oropharyngeal erythema present.  Tender maxillary sinuses,  Eyes: Pupils are equal, round, and reactive to light. Conjunctivae and EOM are normal.  Neck: Normal range of motion. Neck supple.  Cardiovascular: Normal rate.  Pulmonary/Chest: Effort normal.  Abdominal: Soft. There is no tenderness.  Genitourinary: Vaginal discharge found.  Genitourinary Comments: Vaginal discharge,  Thick white,    Musculoskeletal: Normal range of motion.  Neurological: She is alert and oriented to person, place, and time.  Skin: Skin is warm and dry.  Psychiatric: She has a normal mood and affect.     ED Treatments / Results  Labs (all labs ordered are listed, but only abnormal results are displayed) Labs Reviewed  WET PREP, GENITAL - Abnormal; Notable for the following components:      Result Value   Clue Cells Wet Prep HPF POC PRESENT (*)    WBC, Wet Prep HPF POC MODERATE (*)    All other components within normal limits  GC/CHLAMYDIA PROBE AMP ([place]) NOT AT [facility]    EKG None  Radiology No results found.  Procedures Procedures (including critical care time)  Medications Ordered in ED Medications - No data to display   Initial Impression / Assessment and Plan / ED Course  An After Visit Summary was printed and given to the patient. I have reviewed the triage vital signs and the nursing notes.  Pertinent labs & imaging results that were available during my care of the patient were reviewed by me and considered in my medical decision making (see chart for details).    MDM   Pt also reports she has a substance abuse problem.  Pt given referals and resource guide.    Final Clinical Impressions(s) / ED Diagnoses   Final diagnoses:  Upper respiratory tract infection, unspecified type    ED Discharge Orders         Ordered    metroNIDAZOLE (FLAGYL) 500 MG tablet  2 times  daily     10/13/17 0729    doxycycline (VIBRAMYCIN) 100 MG capsule  2 times daily     10/13/17 1610        An After Visit Summary was printed and given to the patient.    Elson Areas, [place] 10/13/17 9604    [place],  Amadeo GarnetPedro Eduardo, MD 10/13/17 [phone]

## 2017-10-14 LAB — GC/CHLAMYDIA PROBE AMP (~~LOC~~) NOT AT ARMC
Chlamydia: NEGATIVE
NEISSERIA GONORRHEA: NEGATIVE

## 2017-11-30 ENCOUNTER — Ambulatory Visit (HOSPITAL_COMMUNITY)
Admission: EM | Admit: 2017-11-30 | Discharge: 2017-11-30 | Disposition: A | Discharge status: Home / Self Care | Payer: Self-pay | Attending: Family Medicine | Admitting: Family Medicine

## 2017-11-30 ENCOUNTER — Encounter (HOSPITAL_COMMUNITY): Payer: Self-pay

## 2017-11-30 DIAGNOSIS — Z113 Encounter for screening for infections with a predominantly sexual mode of transmission: Secondary | ICD-10-CM

## 2017-11-30 DIAGNOSIS — Z8249 Family history of ischemic heart disease and other diseases of the circulatory system: Secondary | ICD-10-CM | POA: Insufficient documentation

## 2017-11-30 DIAGNOSIS — Z3202 Encounter for pregnancy test, result negative: Secondary | ICD-10-CM

## 2017-11-30 DIAGNOSIS — Z881 Allergy status to other antibiotic agents status: Secondary | ICD-10-CM | POA: Insufficient documentation

## 2017-11-30 DIAGNOSIS — N760 Acute vaginitis: Secondary | ICD-10-CM | POA: Insufficient documentation

## 2017-11-30 DIAGNOSIS — B9689 Other specified bacterial agents as the cause of diseases classified elsewhere: Secondary | ICD-10-CM | POA: Insufficient documentation

## 2017-11-30 DIAGNOSIS — A5901 Trichomonal vulvovaginitis: Secondary | ICD-10-CM | POA: Insufficient documentation

## 2017-11-30 DIAGNOSIS — K219 Gastro-esophageal reflux disease without esophagitis: Secondary | ICD-10-CM | POA: Insufficient documentation

## 2017-11-30 DIAGNOSIS — R3989 Other symptoms and signs involving the genitourinary system: Secondary | ICD-10-CM

## 2017-11-30 DIAGNOSIS — Z202 Contact with and (suspected) exposure to infections with a predominantly sexual mode of transmission: Secondary | ICD-10-CM | POA: Insufficient documentation

## 2017-11-30 DIAGNOSIS — F17210 Nicotine dependence, cigarettes, uncomplicated: Secondary | ICD-10-CM | POA: Insufficient documentation

## 2017-11-30 DIAGNOSIS — F419 Anxiety disorder, unspecified: Secondary | ICD-10-CM | POA: Insufficient documentation

## 2017-11-30 DIAGNOSIS — N9089 Other specified noninflammatory disorders of vulva and perineum: Secondary | ICD-10-CM

## 2017-11-30 DIAGNOSIS — N898 Other specified noninflammatory disorders of vagina: Secondary | ICD-10-CM

## 2017-11-30 LAB — POCT URINALYSIS DIP (DEVICE)
BILIRUBIN URINE: NEGATIVE
Glucose, UA: NEGATIVE mg/dL
HGB URINE DIPSTICK: NEGATIVE
LEUKOCYTES UA: NEGATIVE
NITRITE: NEGATIVE
Protein, ur: NEGATIVE mg/dL
Specific Gravity, Urine: >=1.03 (ref 1.005–1.030)
Urobilinogen, UA: 0.2 mg/dL (ref 0.0–1.0)
pH: 5 (ref 5.0–8.0)

## 2017-11-30 LAB — POCT PREGNANCY, URINE: Preg Test, Ur: NEGATIVE

## 2017-11-30 MED ORDER — FLUCONAZOLE 150 MG PO TABS: 150.0000 mg | ORAL_TABLET | Freq: Once | ORAL | 0 refills | Status: AC

## 2017-11-30 MED ORDER — AZITHROMYCIN 250 MG PO TABS
ORAL_TABLET | ORAL | Status: AC
  Filled 2017-11-30: qty 4

## 2017-11-30 MED ORDER — CEFTRIAXONE SODIUM 250 MG IJ SOLR
INTRAMUSCULAR | Status: AC
  Filled 2017-11-30: qty 250

## 2017-11-30 MED ORDER — CEFTRIAXONE SODIUM 250 MG IJ SOLR
250.0000 mg | Freq: Once | INTRAMUSCULAR | Status: AC
  Administered 2017-11-30: 250 mg via INTRAMUSCULAR

## 2017-11-30 MED ORDER — AZITHROMYCIN 250 MG PO TABS
1000.0000 mg | ORAL_TABLET | Freq: Once | ORAL | Status: AC
  Administered 2017-11-30: 1000 mg via ORAL

## 2017-11-30 MED ORDER — LIDOCAINE HCL (PF) 1 % IJ SOLN
INTRAMUSCULAR | Status: AC
  Filled 2017-11-30: qty 2

## 2017-11-30 MED ORDER — METRONIDAZOLE 500 MG PO TABS: 500.0000 mg | ORAL_TABLET | Freq: Two times a day (BID) | ORAL | 0 refills | Status: AC

## 2017-11-30 NOTE — ED Triage Notes (Signed)
Pt presents after exposure to STD; vaginal discharge, rash on vaginal area, odor, and discomfort.

## 2017-11-30 NOTE — Discharge Instructions (Signed)
We have treated you today for gonorrhea and chlamydia, with azithromycin and rocephin. Begin metronidazole twice daily for 7 days. Take 1 tablet of diflucan to treat for yeast. Please refrain from sexual intercourse for 7 days while medicines eliminating infection.   We are testing you for Gonorrhea, Chlamydia, Trichomonas, Yeast and Bacterial Vaginosis. We will call you if anything is positive and let you know if you require any further treatment. Please inform partners of any positive results.   Please return if symptoms not improving with treatment, development of fever, nausea, vomiting, abdominal pain.

## 2017-11-30 NOTE — ED Provider Notes (Signed)
[facility]    [name]: [id number] Arrival date & time: 11/30/17  1313     History   Chief Complaint Chief Complaint  Patient presents with  . Exposure to STD    HPI Erica [name] is a 37 y.o. female no protruding past medical history presenting today for evaluation of vaginal discharge and STDs.  Patient states that she was recently released from prison and had intercourse with an ex-boyfriend.  Since this she has developed discharge, pelvic discomfort and concern over her genital sore.  She has noticed a bump that has been painful and burning to her labia.  It is also been associated with itching.  Last menstrual period was last week.  She is also noting a fishy odor.  She was told that her ex-boyfriend tested positive for gonorrhea and chlamydia and possibly trichomonas.  Patient states that she does not have any money and would need to be treated today.  HPI  Past Medical History:  Diagnosis Date  . Abnormal Pap smear   . Anxiety    as teen  . Anxiety   . Depression    as teen  . GERD (gastroesophageal reflux disease)   . Headache(784.0)   . No pertinent past medical history   . VBAC, delivered, current hospitalization 05/19/2011    Patient Active Problem List   Diagnosis Date Noted  . Acute appendicitis 07/11/2016  . URI (upper respiratory infection) 09/23/2010  . Right otitis media 09/23/2010  . Positive pregnancy test 09/23/2010    Past Surgical History:  Procedure Laterality Date  . APPENDECTOMY    . CESAREAN SECTION    . COLPOSCOPY    . DILATION AND CURETTAGE OF UTERUS  2010  . LAPAROSCOPIC APPENDECTOMY N/A 07/11/2016   Procedure: APPENDECTOMY LAPAROSCOPIC;  Surgeon: Ricarda Frame, MD;  Location: [facility] ORS;  Service: General;  Laterality: N/A;  . WISDOM TOOTH EXTRACTION      OB History    Gravida  7   Para  6   Term  4   Preterm  2   AB  1   Living  6     SAB  1   TAB  0   Ectopic  0   Multiple  0   Live Births  6              Home Medications    Prior to Admission medications   Medication Sig Start Date End Date Taking? Authorizing Provider  fluconazole (DIFLUCAN) 150 MG tablet Take 1 tablet (150 mg total) by mouth once for 1 dose. 11/30/17 11/30/17  Jashira Cotugno C, PA-C  metroNIDAZOLE (FLAGYL) 500 MG tablet Take 1 tablet (500 mg total) by mouth 2 (two) times daily for 7 days. Do not drink alcohol while taking 11/30/17 12/07/17  Paulo Keimig, Junius Creamer, PA-C    Family History Family History  Problem Relation Age of Onset  . Hypertension Mother   . Seizures Mother   . Hypertension Maternal Grandmother   . Arthritis Maternal Grandmother   . Cancer Maternal Grandmother        breast  . Stroke Maternal Grandfather   . Seizures Brother   . Mental illness Brother        paranoid schizophrenia  . Birth defects Daughter        hole in heart  . Seizures Maternal Aunt   . Seizures Brother   . Seizures Cousin   . Seizures Cousin   . Mental retardation Cousin  Social History Social History   Tobacco Use  . Smoking status: Current Every Day Smoker    Packs/day: 1.00    Years: 13.00    Pack years: 13.00    Types: Cigarettes  . Smokeless tobacco: Never Used  Substance Use Topics  . Alcohol use: Yes    Comment: rare  . Drug use: Yes    Types: Cocaine    Comment: last used 3 weeks ago     Allergies   Levaquin [levofloxacin in d5w]   Review of Systems Review of Systems  Constitutional: Negative for fever.  Respiratory: Negative for shortness of breath.   Cardiovascular: Negative for chest pain.  Gastrointestinal: Negative for abdominal pain, diarrhea, nausea and vomiting.  Genitourinary: Positive for dysuria, genital sores and vaginal discharge. Negative for flank pain, hematuria, menstrual problem, vaginal bleeding and vaginal pain.  Musculoskeletal: Negative for back pain.  Skin: Negative for rash.  Neurological: Negative for dizziness, light-headedness and headaches.     Physical  Exam Triage Vital Signs ED Triage Vitals  Enc Vitals Group     BP 11/30/17 1353 115/77     Pulse Rate 11/30/17 1353 95     Resp 11/30/17 1353 20     Temp 11/30/17 1353 98.1 F (36.7 C)     Temp Source 11/30/17 1353 Oral     SpO2 11/30/17 1353 99 %     Weight --      Height --      Head Circumference --      Peak Flow --      Pain Score 11/30/17 1354 5     Pain Loc --      Pain Edu? --      Excl. in GC? --    No data found.  Updated Vital Signs BP 115/77 (BP Location: Left Arm)   Pulse 95   Temp 98.1 F (36.7 C) (Oral)   Resp 20   LMP 11/21/2017   SpO2 99%   Visual Acuity Right Eye Distance:   Left Eye Distance:   Bilateral Distance:    Right Eye Near:   Left Eye Near:    Bilateral Near:     Physical Exam  Constitutional: She is oriented to person, place, and time. She appears well-developed and well-nourished.  No acute distress  HENT:  Head: Normocephalic and atraumatic.  Nose: Nose normal.  Eyes: Conjunctivae are normal.  Neck: Neck supple.  Cardiovascular: Normal rate.  Pulmonary/Chest: Effort normal. No respiratory distress.  Abdominal: She exhibits no distension.  Genitourinary:  Genitourinary Comments: Normal external female genitalia, left labia minora with small ulcerated lesion, swab for HSV.  Vaginal mucosa pink, moderate amount of white milky discharge present in vagina, cervix nonerythematous.  Musculoskeletal: Normal range of motion.  Neurological: She is alert and oriented to person, place, and time.  Skin: Skin is warm and dry.  Psychiatric: She has a normal mood and affect.  Nursing note and vitals reviewed.    UC Treatments / Results  Labs (all labs ordered are listed, but only abnormal results are displayed) Labs Reviewed  POCT URINALYSIS DIP (DEVICE) - Abnormal; Notable for the following components:      Result Value   Ketones, ur TRACE (*)    All other components within normal limits  HSV CULTURE AND TYPING  POCT PREGNANCY,  URINE  CERVICOVAGINAL ANCILLARY ONLY    EKG None  Radiology No results found.  Procedures Procedures (including critical care time)  Medications Ordered in UC Medications  cefTRIAXone (ROCEPHIN) injection 250 mg (250 mg Intramuscular Given 11/30/17 1454)  azithromycin (ZITHROMAX) tablet 1,000 mg (1,000 mg Oral Given 11/30/17 1453)    Initial Impression / Assessment and Plan / UC Course  I have reviewed the triage vital signs and the nursing notes.  Pertinent labs & imaging results that were available during my care of the patient were reviewed by me and considered in my medical decision making (see chart for details).     Patient treated with Rocephin and azithromycin for gonorrhea and chlamydia.  Sent home with metronidazole to take twice daily x7 days to treat track as well as BV as she is tested positive for this and given her fishy odor.  Lesion concerning for possible herpes, will send off swab, will provide treatment if needed.  Pregnancy negative, UA negative.Discussed strict return precautions. Patient verbalized understanding and is agreeable with plan.  Final Clinical Impressions(s) / UC Diagnoses   Final diagnoses:  Exposure to STD  Vaginal discharge  Genital sore     Discharge Instructions     We have treated you today for gonorrhea and chlamydia, with azithromycin and rocephin. Begin metronidazole twice daily for 7 days. Take 1 tablet of diflucan to treat for yeast. Please refrain from sexual intercourse for 7 days while medicines eliminating infection.   We are testing you for Gonorrhea, Chlamydia, Trichomonas, Yeast and Bacterial Vaginosis. We will call you if anything is positive and let you know if you require any further treatment. Please inform partners of any positive results.   Please return if symptoms not improving with treatment, development of fever, nausea, vomiting, abdominal pain.    ED Prescriptions    Medication Sig Dispense Auth. Provider    metroNIDAZOLE (FLAGYL) 500 MG tablet Take 1 tablet (500 mg total) by mouth 2 (two) times daily for 7 days. Do not drink alcohol while taking 14 tablet Burnice Vassel C, PA-C   fluconazole (DIFLUCAN) 150 MG tablet Take 1 tablet (150 mg total) by mouth once for 1 dose. 2 tablet Maurio Baize C, PA-C     Controlled Substance Prescriptions [place] Controlled Substance Registry consulted? Not Applicable   Lew Dawes, [place] 11/30/17 1625

## 2017-12-01 LAB — CERVICOVAGINAL ANCILLARY ONLY
Bacterial vaginitis: POSITIVE — AB
CANDIDA VAGINITIS: NEGATIVE
CHLAMYDIA, DNA PROBE: NEGATIVE
NEISSERIA GONORRHEA: NEGATIVE
Trichomonas: POSITIVE — AB

## 2017-12-02 ENCOUNTER — Telehealth (HOSPITAL_COMMUNITY): Payer: Self-pay

## 2017-12-02 LAB — HSV CULTURE AND TYPING

## 2017-12-02 NOTE — Telephone Encounter (Signed)
Bacterial Vaginosis test is positive.  Prescription for metronidazole was given at the urgent care visit.  Trichomonas is positive. Rx metronidazole was given at the urgent care visit. Pt needs education to please refrain from sexual intercourse for 7 days to give the medicine time to work. Sexual partners need to be notified and tested/treated. Condoms may reduce risk of reinfection. Recheck for further evaluation if symptoms are not improving.   Attempted to reach patient. No answer at this time. Voicemail left.    

## 2017-12-05 ENCOUNTER — Telehealth (HOSPITAL_COMMUNITY): Payer: Self-pay | Admitting: Emergency Medicine

## 2017-12-05 NOTE — Telephone Encounter (Signed)
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  Attempted to reach patient x2. No answer at this time. Voicemail left.

## 2017-12-07 ENCOUNTER — Telehealth (HOSPITAL_COMMUNITY): Payer: Self-pay | Admitting: Emergency Medicine

## 2017-12-07 NOTE — Telephone Encounter (Signed)
Attempted to reach patient x3. No answer at this time. Voicemail left. Letter sent    

## 2018-02-05 ENCOUNTER — Emergency Department (HOSPITAL_COMMUNITY)
Admission: EM | Admit: 2018-02-05 | Discharge: 2018-02-05 | Disposition: A | Discharge status: Home / Self Care | Payer: Self-pay | Attending: Emergency Medicine | Admitting: Emergency Medicine

## 2018-02-05 ENCOUNTER — Encounter (HOSPITAL_COMMUNITY): Payer: Self-pay

## 2018-02-05 ENCOUNTER — Emergency Department (HOSPITAL_COMMUNITY): Payer: Self-pay

## 2018-02-05 DIAGNOSIS — K047 Periapical abscess without sinus: Secondary | ICD-10-CM | POA: Insufficient documentation

## 2018-02-05 DIAGNOSIS — F419 Anxiety disorder, unspecified: Secondary | ICD-10-CM | POA: Insufficient documentation

## 2018-02-05 DIAGNOSIS — K029 Dental caries, unspecified: Secondary | ICD-10-CM | POA: Insufficient documentation

## 2018-02-05 DIAGNOSIS — F1410 Cocaine abuse, uncomplicated: Secondary | ICD-10-CM | POA: Insufficient documentation

## 2018-02-05 DIAGNOSIS — F329 Major depressive disorder, single episode, unspecified: Secondary | ICD-10-CM | POA: Insufficient documentation

## 2018-02-05 DIAGNOSIS — F17210 Nicotine dependence, cigarettes, uncomplicated: Secondary | ICD-10-CM | POA: Insufficient documentation

## 2018-02-05 DIAGNOSIS — R45851 Suicidal ideations: Secondary | ICD-10-CM | POA: Insufficient documentation

## 2018-02-05 DIAGNOSIS — A599 Trichomoniasis, unspecified: Secondary | ICD-10-CM | POA: Insufficient documentation

## 2018-02-05 LAB — COMPREHENSIVE METABOLIC PANEL
ALT: 13 U/L (ref 0–44)
AST: 15 U/L (ref 15–41)
Albumin: 3.5 g/dL (ref 3.5–5.0)
Alkaline Phosphatase: 46 U/L (ref 38–126)
Anion gap: 6 (ref 5–15)
BILIRUBIN TOTAL: 0.5 mg/dL (ref 0.3–1.2)
BUN: 7 mg/dL (ref 6–20)
CHLORIDE: 108 mmol/L (ref 98–111)
CO2: 25 mmol/L (ref 22–32)
Calcium: 9 mg/dL (ref 8.9–10.3)
Creatinine, Ser: 0.78 mg/dL (ref 0.44–1.00)
Glucose, Bld: 102 mg/dL — ABNORMAL HIGH (ref 70–99)
Potassium: 3.4 mmol/L — ABNORMAL LOW (ref 3.5–5.1)
Sodium: 139 mmol/L (ref 135–145)
TOTAL PROTEIN: 5.8 g/dL — AB (ref 6.5–8.1)

## 2018-02-05 LAB — URINALYSIS, ROUTINE W REFLEX MICROSCOPIC
Bacteria, UA: NONE SEEN
Bilirubin Urine: NEGATIVE
GLUCOSE, UA: NEGATIVE mg/dL
Ketones, ur: NEGATIVE mg/dL
NITRITE: NEGATIVE
PH: 6 (ref 5.0–8.0)
PROTEIN: NEGATIVE mg/dL
SPECIFIC GRAVITY, URINE: 1.023 (ref 1.005–1.030)

## 2018-02-05 LAB — RAPID URINE DRUG SCREEN, HOSP PERFORMED
AMPHETAMINES: NOT DETECTED
BARBITURATES: NOT DETECTED
BENZODIAZEPINES: NOT DETECTED
Cocaine: POSITIVE — AB
Opiates: NOT DETECTED
TETRAHYDROCANNABINOL: NOT DETECTED

## 2018-02-05 LAB — CBC WITH DIFFERENTIAL/PLATELET
Abs Immature Granulocytes: 0.02 10*3/uL (ref 0.00–0.07)
BASOS PCT: 1 %
Basophils Absolute: 0.1 10*3/uL (ref 0.0–0.1)
EOS ABS: 0.2 10*3/uL (ref 0.0–0.5)
Eosinophils Relative: 3 %
HEMATOCRIT: 40.9 % (ref 36.0–46.0)
Hemoglobin: 13.3 g/dL (ref 12.0–15.0)
Immature Granulocytes: 0 %
LYMPHS ABS: 2.5 10*3/uL (ref 0.7–4.0)
Lymphocytes Relative: 29 %
MCH: 32.4 pg (ref 26.0–34.0)
MCHC: 32.5 g/dL (ref 30.0–36.0)
MCV: 99.8 fL (ref 80.0–100.0)
MONO ABS: 0.5 10*3/uL (ref 0.1–1.0)
MONOS PCT: 6 %
Neutro Abs: 5.2 10*3/uL (ref 1.7–7.7)
Neutrophils Relative %: 61 %
Platelets: 255 10*3/uL (ref 150–400)
RBC: 4.1 MIL/uL (ref 3.87–5.11)
RDW: 13.1 % (ref 11.5–15.5)
WBC: 8.4 10*3/uL (ref 4.0–10.5)
nRBC: 0 % (ref 0.0–0.2)

## 2018-02-05 LAB — WET PREP, GENITAL
Clue Cells Wet Prep HPF POC: NONE SEEN
Sperm: NONE SEEN
Yeast Wet Prep HPF POC: NONE SEEN

## 2018-02-05 LAB — RAPID HIV SCREEN (HIV 1/2 AB+AG)
HIV 1/2 Antibodies: NONREACTIVE
HIV-1 P24 Antigen - HIV24: NONREACTIVE

## 2018-02-05 LAB — PREGNANCY, URINE: PREG TEST UR: NEGATIVE

## 2018-02-05 LAB — ACETAMINOPHEN LEVEL

## 2018-02-05 LAB — SALICYLATE LEVEL: Salicylate Lvl: <7 mg/dL (ref 2.8–30.0)

## 2018-02-05 MED ORDER — ACETAMINOPHEN 500 MG PO TABS
1000.0000 mg | ORAL_TABLET | Freq: Once | ORAL | Status: AC
  Administered 2018-02-05: 1000 mg via ORAL
  Filled 2018-02-05: qty 2

## 2018-02-05 MED ORDER — PENICILLIN V POTASSIUM 500 MG PO TABS: 500.0000 mg | ORAL_TABLET | Freq: Four times a day (QID) | ORAL | 0 refills | Status: DC

## 2018-02-05 MED ORDER — PENICILLIN V POTASSIUM 500 MG PO TABS: 500.0000 mg | ORAL_TABLET | Freq: Four times a day (QID) | ORAL | 0 refills | Status: AC

## 2018-02-05 MED ORDER — METRONIDAZOLE 500 MG PO TABS
2000.0000 mg | ORAL_TABLET | Freq: Once | ORAL | Status: AC
  Administered 2018-02-05: 2000 mg via ORAL
  Filled 2018-02-05: qty 4

## 2018-02-05 NOTE — BH Assessment (Signed)
Tele Assessment Note   Patient Name: Erica [name] MRN: [id number] Referring Physician: EDP Location of Patient: [facility] Location of Provider: Behavioral Health TTS Department  Erica [name] is a 38 y.o. female who presented to [facility] on voluntary basis with medical complaint.  During medical consult, Pt endorsed despondency and suicidal ideation.  Pt has not been assessed by TTS before.  Pt lives in [place].  She has her own place, but she also spends time with friends.  Pt denied current psychiatric care.  Pt was irritable and impatient during assessment.  ''Let me get to the point.  I want to get out of here.''  Pt reported that she came to the hospital for medical issues, and also because she hoped she might be able to get a referral to [facility] [place] for treatment of ongoing cocaine issues.  Pt tested positive for cocaine.  Pt stated that she endorsed suicidal ideation to hospital staff, but denied to author.  Pt stated that she endorsed suicidal ideation (passive, no plan) because she thought it would help her with RTS placement.  ''I overdid it.''  Pt endorsed despondency.  Pt reported that she would like treatment for cocaine.  Pt denied past or current psychiatric treatment.    Pt is on probation.  She spends time with friends.  She stated that she would like to go to [facility] so she can be closer to her daughter as her daughter is about to give birth.  During assessment, Pt presented as alert and oriented.  She had good eye contact and was cooperative.  Pt's mood was irritable and preoccupied (with going to R[facility]).  Affect was mood-congruent.  Pt had endorsed passive suicidal ideation and despondency.  To Erica [name], she endorsed despondency and ongoing cocaine use for which she would like treatment at [facility].  Pt's speech was norma in rate, rhythm, and volume.  Thought processes were within normal range, and thought content were within normal range.  There was no evidence of  delusion or hallucination.  Pt denied homicidal ideation and self-injurious behavior.  Pt's memory and concentration were intact.  Insight, judgment, and impulse control were fair.  Consulted with Chilton Greathouse, NP, who determined that Pt does not meet inpatient criteria.  She may be psych-cleared.  Diagnosis: Cocaine Use Disorder  Past Medical History:  Past Medical History:  Diagnosis Date  . Abnormal Pap smear   . Anxiety    as teen  . Anxiety   . Depression    as teen  . GERD (gastroesophageal reflux disease)   . Headache(784.0)   . No pertinent past medical history   . VBAC, delivered, current hospitalization 05/19/2011    Past Surgical History:  Procedure Laterality Date  . APPENDECTOMY    . CESAREAN SECTION    . COLPOSCOPY    . DILATION AND CURETTAGE OF UTERUS  2010  . LAPAROSCOPIC APPENDECTOMY N/A 07/11/2016   Procedure: APPENDECTOMY LAPAROSCOPIC;  Surgeon: Ricarda Frame, MD;  Location: [facility] ORS;  Service: General;  Laterality: N/A;  . WISDOM TOOTH EXTRACTION      Family History:  Family History  Problem Relation Age of Onset  . Hypertension Mother   . Seizures Mother   . Hypertension Maternal Grandmother   . Arthritis Maternal Grandmother   . Cancer Maternal Grandmother        breast  . Stroke Maternal Grandfather   . Seizures Brother   . Mental illness Brother  paranoid schizophrenia  . Birth defects Daughter        hole in heart  . Seizures Maternal Aunt   . Seizures Brother   . Seizures Cousin   . Seizures Cousin   . Mental retardation Cousin     Social History:  reports that she has been smoking cigarettes. She has a 13.00 pack-year smoking history. She has never used smokeless tobacco. She reports current alcohol use. She reports current drug use. Frequency: 5.00 times per week. Drug: Cocaine.  Additional Social History:  Alcohol / Drug Use Pain Medications: See MAR Prescriptions: See MAR Over the Counter: See MAR History of alcohol / drug  use?: Yes Substance #1 Name of Substance 1: Cocaine 1 - Amount (size/oz): Varied 1 - Frequency: Weekly 1 - Duration: Ongoing 1 - Last Use / Amount: 02/05/2018  CIWA: CIWA-Ar BP: 120/83 Pulse Rate: 77 COWS:    Allergies:  Allergies  Allergen Reactions  . Levaquin [Levofloxacin In D5w] Swelling    Lip swelling, hives, chest tightness    Home Medications: (Not in a hospital admission)   OB/GYN Status:  Patient's last menstrual period was 02/05/2018 (exact date).  General Assessment Data Assessment unable to be completed: Yes Reason for not completing assessment: Audrey,RN, report not a good time to assessment patient [facility] TTS Assessment: In system Is this a Tele or Face-to-Face Assessment?: Tele Assessment Is this an Initial Assessment or a Re-assessment for this encounter?: Initial Assessment Patient Accompanied by:: N/A Language Other than English: No Living Arrangements: Other (Comment) What gender do you identify as?: Female Marital status: Single Pregnancy Status: No Living Arrangements: Alone, Non-relatives/Friends(Spends time between her home and friends' homes) Can pt return to current living arrangement?: Yes Admission Status: Voluntary Is patient capable of signing voluntary admission?: Yes Referral Source: Self/Family/Friend Insurance type: None     Crisis Care Plan Living Arrangements: Alone, Non-relatives/Friends(Spends time between her home and friends' homes) Name of Psychiatrist: None Name of Therapist: None  Education Status Is patient currently in school?: No Is the patient employed, unemployed or receiving disability?: Employed  Risk to self with the past 6 months Suicidal Ideation: No(See notes; passive ideation only) Has patient been a risk to self within the past 6 months prior to admission? : No Suicidal Intent: No Has patient had any suicidal intent within the past 6 months prior to admission? : No Is patient at  risk for suicide?: No Suicidal Plan?: No Has patient had any suicidal plan within the past 6 months prior to admission? : No Access to Means: No What has been your use of drugs/alcohol within the last 12 months?: Cocaine Previous Attempts/Gestures: No Intentional Self Injurious Behavior: None Family Suicide History: No Recent stressful life event(s): Other (Comment)(Continued drug use) Persecutory voices/beliefs?: No Depression: Yes Depression Symptoms: Despondent Substance abuse history and/or treatment for substance abuse?: No Suicide prevention information given to non-admitted patients: Not applicable  Risk to Others within the past 6 months Homicidal Ideation: No Does patient have any lifetime risk of violence toward others beyond the six months prior to admission? : No Thoughts of Harm to Others: No Current Homicidal Intent: No Current Homicidal Plan: No Access to Homicidal Means: No History of harm to others?: No Assessment of Violence: None Noted Does patient have access to weapons?: No Criminal Charges Pending?: No Does patient have a court date: No Is patient on probation?: Yes  Psychosis Hallucinations: None noted Delusions: None noted  Mental Status Report Appearance/Hygiene: In  scrubs, Unremarkable Eye Contact: Fair Motor Activity: Freedom of movement, Unremarkable Speech: Logical/coherent Level of Consciousness: Alert Mood: Preoccupied, Irritable Affect: Appropriate to circumstance Anxiety Level: None Thought Processes: Coherent, Relevant Judgement: Partial Orientation: Person, Place, Time, Situation Obsessive Compulsive Thoughts/Behaviors: None  Cognitive Functioning Concentration: Normal Memory: Recent Intact, Remote Intact Is patient IDD: No Insight: Fair Impulse Control: Fair Appetite: Good Have you had any weight changes? : No Change Sleep: No Change Vegetative Symptoms: None  ADLScreening [facility] Assessment Services) Patient's cognitive  ability adequate to safely complete daily activities?: Yes Patient able to express need for assistance with ADLs?: Yes Independently performs ADLs?: Yes (appropriate for developmental age)  Prior Inpatient Therapy Prior Inpatient Therapy: No  Prior Outpatient Therapy Prior Outpatient Therapy: No Does patient have an ACCT team?: No Does patient have Intensive In-House Services?  : No Does patient have [facility] services? : No Does patient have P4CC services?: No  ADL Screening (condition at time of admission) Patient's cognitive ability adequate to safely complete daily activities?: Yes Patient able to express need for assistance with ADLs?: Yes Independently performs ADLs?: Yes (appropriate for developmental age)       Abuse/Neglect Assessment (Assessment to be complete while patient is alone) Abuse/Neglect Assessment Can Be Completed: Yes Physical Abuse: Denies Verbal Abuse: Denies Sexual Abuse: Denies Exploitation of patient/patient's resources: Denies Self-Neglect: Denies Values / Beliefs Cultural Requests During Hospitalization: None Spiritual Requests During Hospitalization: None Consults Spiritual Care Consult Needed: No Social Work Consult Needed: No Merchant navy officer (For Healthcare) Does Patient Have a Medical Advance Directive?: No Would patient like information on creating a medical advance directive?: No - Patient declined          Disposition:  Disposition Initial Assessment Completed for this Encounter: Yes Disposition of Patient: Discharge(Per T. Melvyn Neth, NP, Pt does not meet inpatient criteria)  This service was provided via telemedicine using a 2-way, interactive audio and video technology.  Names of all persons participating in this telemedicine service and their role in this encounter. Name: Miliyah, Mccrea Role: Patient             Earline Mayotte 02/05/2018 6:10 PM

## 2018-02-05 NOTE — ED Notes (Signed)
IVC papers served then Rescinded - copy faxed to [employer], copy sent to [employer], and original placed in folder for [employer].

## 2018-02-05 NOTE — ED Triage Notes (Signed)
TTS  Notified

## 2018-02-05 NOTE — ED Provider Notes (Addendum)
[name]   [name]: [id number] Arrival date & time: 02/05/18  1409  History   Chief Complaint Chief Complaint  Patient presents with  . Suicidal  . Urinary Tract Infection  . Dental Pain    HPI Erica CourserLisa M [name] is a 38 y.o. female.  HPI   Patient is a 38 year old female with a past medical history of crack cocaine abuse, GERD, depression, and anxiety who presents for evaluation of multiple complaints including suicidal ideation with a plan to overdose on heroin, acute on chronic right sided tooth pain, and 3 days of urinary frequency, itching, and burning.  Patient also notes some shortness of breath as well as 1 episode of nonbloody nonbilious emesis yesterday.  She states she has not vomited today.  She states she last used drugs approximately an hour prior to arrival.  Patient denies any acute headache, vertigo, earache, sore throat, cough, diarrhea, blood in her stool, blood in her urine, rash, focal extremity numbness weakness or tingling, known cardiac history, history of DVT/PE, history of malignancy, estrogen supplementation, history of CVA, or other drug use.  She states she drinks alcohol intermittently but not every day.  She denies any other illicit drug use.  Denies personal episodes.  Denies alleviating or aggravating factors.  She does note she is currently undomiciled does not have insurance and this seems to be 1 of her main concerns today.  Past Medical History:  Diagnosis Date  . Abnormal Pap smear   . Anxiety    as teen  . Anxiety   . Depression    as teen  . GERD (gastroesophageal reflux disease)   . Headache(784.0)   . No pertinent past medical history   . VBAC, delivered, current hospitalization 05/19/2011    Patient Active Problem List   Diagnosis Date Noted  . Acute appendicitis 07/11/2016  . URI (upper respiratory infection) 09/23/2010  . Right otitis media 09/23/2010  . Positive pregnancy test  09/23/2010    Past Surgical History:  Procedure Laterality Date  . APPENDECTOMY    . CESAREAN SECTION    . COLPOSCOPY    . DILATION AND CURETTAGE OF UTERUS  2010  . LAPAROSCOPIC APPENDECTOMY N/A 07/11/2016   Procedure: APPENDECTOMY LAPAROSCOPIC;  Surgeon: Ricarda FrameWoodham, Charles, MD;  Location: [facility] ORS;  Service: General;  Laterality: N/A;  . WISDOM TOOTH EXTRACTION       OB History    Gravida  7   Para  6   Term  4   Preterm  2   AB  1   Living  6     SAB  1   TAB  0   Ectopic  0   Multiple  0   Live Births  6            Home Medications    Prior to Admission medications   Medication Sig Start Date End Date Taking? Authorizing Provider  penicillin v potassium (VEETID) 500 MG tablet Take 1 tablet (500 mg total) by mouth 4 (four) times daily for 7 days. 02/05/18 02/12/18  Antoine PrimasSmith, Zachary, MD    Family History Family History  Problem Relation Age of Onset  . Hypertension Mother   . Seizures Mother   . Hypertension Maternal Grandmother   . Arthritis Maternal Grandmother   . Cancer Maternal Grandmother        breast  . Stroke Maternal Grandfather   . Seizures Brother   . Mental illness Brother  paranoid schizophrenia  . Birth defects Daughter        hole in heart  . Seizures Maternal Aunt   . Seizures Brother   . Seizures Cousin   . Seizures Cousin   . Mental retardation Cousin     Social History Social History   Tobacco Use  . Smoking status: Current Every Day Smoker    Packs/day: 1.00    Years: 13.00    Pack years: 13.00    Types: Cigarettes  . Smokeless tobacco: Never Used  Substance Use Topics  . Alcohol use: Yes    Comment: episodic  . Drug use: Yes    Frequency: 5.0 times per week    Types: Cocaine    Comment: +coc     Allergies   Levaquin [levofloxacin in d5w]   Review of Systems Review of Systems  Constitutional: Negative for chills and fever.  HENT: Positive for dental problem. Negative for ear pain and sore throat.    Eyes: Negative for pain and visual disturbance.  Respiratory: Positive for shortness of breath. Negative for cough.   Cardiovascular: Negative for chest pain and palpitations.  Gastrointestinal: Positive for vomiting. Negative for abdominal pain.  Genitourinary: Positive for dysuria and vaginal discharge. Negative for hematuria.  Musculoskeletal: Negative for arthralgias and back pain.  Skin: Negative for color change and rash.  Neurological: Negative for seizures and syncope.  All other systems reviewed and are negative.    Physical Exam Updated Vital Signs BP 120/83 (BP Location: Left Arm)   Pulse 77   Temp 98.2 F (36.8 C) (Oral)   Resp 17   Ht 5' 3.5" (1.613 m)   Wt 63 kg   LMP 02/05/2018 (Exact Date)   SpO2 100%   BMI 24.24 kg/m   Physical Exam Vitals signs and nursing note reviewed. Exam conducted with a chaperone present.  Constitutional:      General: She is not in acute distress.    Appearance: She is well-developed.  HENT:     Head: Normocephalic and atraumatic.     Jaw: No trismus.     Right Ear: External ear normal.     Left Ear: External ear normal.     Nose: Nose normal.     Mouth/Throat:     Mouth: Mucous membranes are moist.     Dentition: Dental caries present.  Eyes:     Extraocular Movements: Extraocular movements intact.     Conjunctiva/sclera: Conjunctivae normal.     Pupils: Pupils are equal, round, and reactive to light.  Neck:     Musculoskeletal: Neck supple. No neck rigidity.  Cardiovascular:     Rate and Rhythm: Normal rate and regular rhythm.     Pulses: Normal pulses.     Heart sounds: No murmur.  Pulmonary:     Effort: Pulmonary effort is normal. No respiratory distress.     Breath sounds: Normal breath sounds.  Abdominal:     Palpations: Abdomen is soft.     Tenderness: There is no abdominal tenderness.  Genitourinary:    Exam position: Supine.     Cervix: No cervical motion tenderness, discharge, friability or erythema.    Skin:    General: Skin is warm and dry.     Capillary Refill: Capillary refill takes less than 2 seconds.  Neurological:     General: No focal deficit present.     Mental Status: She is alert.     Cranial Nerves: No cranial nerve deficit.  Sensory: No sensory deficit.     Motor: No weakness.      ED Treatments / Results  Labs (all labs ordered are listed, but only abnormal results are displayed) Labs Reviewed  WET PREP, GENITAL - Abnormal; Notable for the following components:      Result Value   Trich, Wet Prep PRESENT (*)    WBC, Wet Prep HPF POC FEW (*)    All other components within normal limits  COMPREHENSIVE METABOLIC PANEL - Abnormal; Notable for the following components:   Potassium 3.4 (*)    Glucose, Bld 102 (*)    Total Protein 5.8 (*)    All other components within normal limits  URINALYSIS, ROUTINE W REFLEX MICROSCOPIC - Abnormal; Notable for the following components:   Hgb urine dipstick LARGE (*)    Leukocytes, UA TRACE (*)    All other components within normal limits  RAPID URINE DRUG SCREEN, HOSP PERFORMED - Abnormal; Notable for the following components:   Cocaine POSITIVE (*)    All other components within normal limits  ACETAMINOPHEN LEVEL - Abnormal; Notable for the following components:   Acetaminophen (Tylenol), Serum <10 (*)    All other components within normal limits  URINE CULTURE  CBC WITH DIFFERENTIAL/PLATELET  PREGNANCY, URINE  SALICYLATE LEVEL  RAPID HIV SCREEN (HIV 1/2 AB+AG)  GC/CHLAMYDIA PROBE AMP ([place]) NOT AT [facility]    EKG EKG Interpretation  Date/Time:  Sunday February 05 2018 16:36:24 EST Ventricular Rate:  66 PR Interval:    QRS Duration: 114 QT Interval:  419 QTC Calculation: 439 R Axis:   74 Text Interpretation:  Sinus rhythm Borderline intraventricular conduction delay Confirmed by Jacalyn Lefevre [phone]) on 02/05/2018 4:47:57 PM Also confirmed by Jacalyn Lefevre [phone]), editor Barbette Hair [phone])  on  02/05/2018 4:56:51 PM   Radiology Dg Chest 2 View  Result Date: 02/05/2018 CLINICAL DATA:  Shortness of breath. EXAM: CHEST - 2 VIEW COMPARISON:  Radiographs of Jun 18, 2017. FINDINGS: The heart size and mediastinal contours are within normal limits. Both lungs are clear. No pneumothorax or pleural effusion is noted. The visualized skeletal structures are unremarkable. IMPRESSION: No active cardiopulmonary disease. Electronically Signed   By: Lupita Raider, M.D.   On: 02/05/2018 16:15    Procedures Procedures (including critical care time)  Medications Ordered in ED Medications  metroNIDAZOLE (FLAGYL) tablet 2,000 mg (2,000 mg Oral Given 02/05/18 1834)  acetaminophen (TYLENOL) tablet 1,000 mg (1,000 mg Oral Given 02/05/18 1835)     Initial Impression / Assessment and Plan / ED Course  I have reviewed the triage vital signs and the nursing notes.  Pertinent labs & imaging results that were available during my care of the patient were reviewed by me and considered in my medical decision making (see chart for details).     Patient is a 38 year old female who presents with above-stated history exam.  On presentation patient is afebrile stable vital signs.  Exam as above remarkable for poor addition tissue with dental caries.  Patient's abdomen is soft nontender and her lungs clear to auscultation bilaterally.  Chest x-ray was obtained that was unremarkable for any acute intrathoracic abnormalities including focal consolidation suggestive of pneumonia, pneumothorax, or other pathology.  CBC WNL.  CMP shows a potassium of 3.4 with otherwise no significant electrolyte or metabolic abnormalities.  Serum acetaminophen and salicylate levels undetectable.  Pelvic exam unremarkable wet prep does show trichomoniasis.  HIV is negative.  UA shows large blood and trace leukocyte esterase.  UDS positive for cocaine.  Urine pregnancy test is negative.  Patient was seen and evaluated by behavioral health  specialist who stated the patient did not meet inpatient criteria.  Patient was given Flagyl for her trichomoniasis and Tylenol for her tooth pain.  In addition she was given a prescription for penicillin and instructed to follow-up with dentistry as soon as possible.  Patient was also instructed to refrain from further crack cocaine use and follow-up with primary care physician in 3 to 5 days.  Patient voiced understanding and agreement with plan prior to discharge.  Final Clinical Impressions(s) / ED Diagnoses   Final diagnoses:  Cocaine abuse (HCC)  Infected dental carries  Trichimoniasis    ED Discharge Orders         Ordered    penicillin v potassium (VEETID) 500 MG tablet  4 times daily,   Status:  Discontinued     02/05/18 1806    penicillin v potassium (VEETID) 500 MG tablet  4 times daily     02/05/18 1826           Antoine Primas, MD 02/05/18 Irene Shipper, MD 02/05/18 Ellard Artis, MD 02/05/18 1858

## 2018-02-05 NOTE — ED Notes (Signed)
Staffing Office aware of need for Sitter. 

## 2018-02-05 NOTE — Consult Note (Signed)
Telepsych Consultation   Reason for Consult:  Suicidal ideations  Referring Physician:  EDP Location of Patient:  Location of Provider: [facility]  Patient Identification: Erica [name] MRN:  [id number] Principal Diagnosis: <principal problem not specified> Diagnosis:  Active Problems:   * No active hospital problems. *   Total Time spent with patient: 15 minutes  Subjective:   Erica [name] is a 38 y.o. female reassess for suicidal ideation as reported on admissions. Erica [name] was reevaluated by TTS counseling and this NP.  Patient reports " if I said that I was suicidal, I thought I could get into [facility] quicker" patient denies previous inpatient admission.  Denies suicidal or homicidal ideations during this assessment.  Denies auditory visual hallucinations.  Denies taking daily antidepressant or mood stabilization medications.  USD +Cocain.  Reports issues with substance abuse to cocaine.  And is seeking additional follow-up resources. Patient reports mild depressed related to substances use.   Patient reports she is currently on probation which she provided probation officers contact information [address] Patient provided verbal authorization to contact [facility] at [phone].   Case staffed with MD F. Cobose.  Patient to discharge with addition of substance abuse resources.TTS faxed  Support, encouragement reassurance was provided.  HPI: Per admission assessment note: Patient is a 38 year old female with a past medical history of crack cocaine abuse, GERD, depression, and anxiety who presents for evaluation of multiple complaints including suicidal ideation with a plan to overdose on heroin, acute on chronic right sided tooth pain, and 3 days of urinary frequency, itching, and burning.  Patient also notes some shortness of breath as well as 1 episode of nonbloody nonbilious emesis yesterday.  She states she has not vomited today.  She states she last used drugs approximately  an hour prior to arrival.  Patient denies any acute headache, vertigo, earache, sore throat, cough, diarrhea, blood in her stool, blood in her urine, rash, focal extremity numbness weakness or tingling, known cardiac history, history of DVT/PE, history of malignancy, estrogen supplementation, history of CVA, or other drug use.  She states she drinks alcohol intermittently but not every day.  She denies any other illicit drug use.  Denies personal episodes.  Denies alleviating or aggravating factors.  She does note she is currently undomiciled does not have insurance and this seems to be 1 of her main concerns today.  Past Psychiatric History: denied, reported substance abuse history  Risk to Self: Suicidal Ideation: No(See notes; passive ideation only) Suicidal Intent: No Is patient at risk for suicide?: No Suicidal Plan?: No Access to Means: No What has been your use of drugs/alcohol within the last 12 months?: Cocaine Intentional Self Injurious Behavior: None Risk to Others: Homicidal Ideation: No Thoughts of Harm to Others: No Current Homicidal Intent: No Current Homicidal Plan: No Access to Homicidal Means: No History of harm to others?: No Assessment of Violence: None Noted Does patient have access to weapons?: No Criminal Charges Pending?: No Does patient have a court date: No Prior Inpatient Therapy: Prior Inpatient Therapy: No Prior Outpatient Therapy: Prior Outpatient Therapy: No Does patient have an ACCT team?: No Does patient have Intensive In-House Services?  : No Does patient have [facility] services? : No Does patient have P4CC services?: No  Past Medical History:  Past Medical History:  Diagnosis Date  . Abnormal Pap smear   . Anxiety    as teen  . Anxiety   . Depression    as  teen  . GERD (gastroesophageal reflux disease)   . Headache(784.0)   . No pertinent past medical history   . VBAC, delivered, current hospitalization 05/19/2011    Past Surgical History:   Procedure Laterality Date  . APPENDECTOMY    . CESAREAN SECTION    . COLPOSCOPY    . DILATION AND CURETTAGE OF UTERUS  2010  . LAPAROSCOPIC APPENDECTOMY N/A 07/11/2016   Procedure: APPENDECTOMY LAPAROSCOPIC;  Surgeon: Ricarda FrameWoodham, Charles, MD;  Location: [facility] ORS;  Service: General;  Laterality: N/A;  . WISDOM TOOTH EXTRACTION     Family History:  Family History  Problem Relation Age of Onset  . Hypertension Mother   . Seizures Mother   . Hypertension Maternal Grandmother   . Arthritis Maternal Grandmother   . Cancer Maternal Grandmother        breast  . Stroke Maternal Grandfather   . Seizures Brother   . Mental illness Brother        paranoid schizophrenia  . Birth defects Daughter        hole in heart  . Seizures Maternal Aunt   . Seizures Brother   . Seizures Cousin   . Seizures Cousin   . Mental retardation Cousin    Family Psychiatric  History:  Social History:  Social History   Substance and Sexual Activity  Alcohol Use Yes   Comment: episodic     Social History   Substance and Sexual Activity  Drug Use Yes  . Frequency: 5.0 times per week  . Types: Cocaine   Comment: +coc    Social History   Socioeconomic History  . Marital status: Legally Separated    Spouse name: Not on file  . Number of children: Not on file  . Years of education: Not on file  . Highest education level: Not on file  Occupational History  . Not on file  Social Needs  . Financial resource strain: Not on file  . Food insecurity:    Worry: Not on file    Inability: Not on file  . Transportation needs:    Medical: Not on file    Non-medical: Not on file  Tobacco Use  . Smoking status: Current Every Day Smoker    Packs/day: 1.00    Years: 13.00    Pack years: 13.00    Types: Cigarettes  . Smokeless tobacco: Never Used  Substance and Sexual Activity  . Alcohol use: Yes    Comment: episodic  . Drug use: Yes    Frequency: 5.0 times per week    Types: Cocaine    Comment: +coc   . Sexual activity: Yes    Birth control/protection: None  Lifestyle  . Physical activity:    Days per week: Not on file    Minutes per session: Not on file  . Stress: Not on file  Relationships  . Social connections:    Talks on phone: Not on file    Gets together: Not on file    Attends religious service: Not on file    Active member of club or organization: Not on file    Attends meetings of clubs or organizations: Not on file    Relationship status: Not on file  Other Topics Concern  . Not on file  Social History Narrative   [place] in [place]; has a fixed address, but usually stays with friends.  Trying to reach [facility] in [place].   Additional Social History:    Allergies:   Allergies  Allergen Reactions  . Levaquin [Levofloxacin In D5w] Swelling    Lip swelling, hives, chest tightness    Labs:  Results for orders placed or performed during the hospital encounter of 02/05/18 (from the past 48 hour(s))  Urinalysis, Routine w reflex microscopic     Status: Abnormal   Collection Time: 02/05/18  3:20 PM  Result Value Ref Range   Color, Urine YELLOW YELLOW   APPearance CLEAR CLEAR   Specific Gravity, Urine 1.023 1.005 - 1.030   pH 6.0 5.0 - 8.0   Glucose, UA NEGATIVE NEGATIVE mg/dL   Hgb urine dipstick LARGE (A) NEGATIVE   Bilirubin Urine NEGATIVE NEGATIVE   Ketones, ur NEGATIVE NEGATIVE mg/dL   Protein, ur NEGATIVE NEGATIVE mg/dL   Nitrite NEGATIVE NEGATIVE   Leukocytes, UA TRACE (A) NEGATIVE   RBC / HPF 6-10 0 - 5 RBC/hpf   WBC, UA 0-5 0 - 5 WBC/hpf   Bacteria, UA NONE SEEN NONE SEEN   Squamous Epithelial / LPF 0-5 0 - 5   Mucus PRESENT     Comment: Performed at [facility], [address], [place], [place] [address]  Rapid urine drug screen (hospital performed)     Status: Abnormal   Collection Time: 02/05/18  3:20 PM  Result Value Ref Range   Opiates NONE DETECTED NONE DETECTED   Cocaine POSITIVE (A) NONE DETECTED   Benzodiazepines NONE  DETECTED NONE DETECTED   Amphetamines NONE DETECTED NONE DETECTED   Tetrahydrocannabinol NONE DETECTED NONE DETECTED   Barbiturates NONE DETECTED NONE DETECTED    Comment: (NOTE) DRUG SCREEN FOR MEDICAL PURPOSES ONLY.  IF CONFIRMATION IS NEEDED FOR ANY PURPOSE, NOTIFY LAB WITHIN 5 DAYS. LOWEST DETECTABLE LIMITS FOR URINE DRUG SCREEN Drug Class                     Cutoff (ng/mL) Amphetamine and metabolites    1000 Barbiturate and metabolites    200 Benzodiazepine                 200 Tricyclics and metabolites     300 Opiates and metabolites        300 Cocaine and metabolites        300 THC                            50 Performed at [facility], [address] [place], [place] [address]   Pregnancy, urine     Status: None   Collection Time: 02/05/18  3:20 PM  Result Value Ref Range   Preg Test, Ur NEGATIVE NEGATIVE    Comment:        THE SENSITIVITY OF THIS METHODOLOGY IS >20 mIU/mL. Performed at [facility], [address] [place] [address], [place], [place] [address]   Wet prep, genital     Status: Abnormal   Collection Time: 02/05/18  4:57 PM  Result Value Ref Range   Yeast Wet Prep HPF POC NONE SEEN NONE SEEN   Trich, Wet Prep PRESENT (A) NONE SEEN   Clue Cells Wet Prep HPF POC NONE SEEN NONE SEEN   WBC, Wet Prep HPF POC FEW (A) NONE SEEN   Sperm NONE SEEN     Comment: Performed at [facility] [place] [facility], [address], [place], [place] [address]  CBC with Differential     Status: None   Collection Time: 02/05/18  5:03 PM  Result Value Ref Range  WBC 8.4 4.0 - 10.5 K/uL   RBC 4.10 3.87 - 5.11 MIL/uL   Hemoglobin 13.3 12.0 - 15.0 g/dL   HCT 65.7 84.6 - 96.2 %   MCV 99.8 80.0 - 100.0 fL   MCH 32.4 26.0 - 34.0 pg   MCHC 32.5 30.0 - 36.0 g/dL   RDW 95.2 84.1 - 32.4 %   Platelets 255 150 - 400 K/uL   nRBC 0.0 0.0 - 0.2 %   Neutrophils Relative % 61 %   Neutro Abs 5.2 1.7 - 7.7 K/uL   Lymphocytes Relative 29 %   Lymphs Abs 2.5 0.7 - 4.0 K/uL   Monocytes Relative 6 %    Monocytes Absolute 0.5 0.1 - 1.0 K/uL   Eosinophils Relative 3 %   Eosinophils Absolute 0.2 0.0 - 0.5 K/uL   Basophils Relative 1 %   Basophils Absolute 0.1 0.0 - 0.1 K/uL   Immature Granulocytes 0 %   Abs Immature Granulocytes 0.02 0.00 - 0.07 K/uL    Comment: Performed at [facility], [address], [place], [place] [address]  Comprehensive metabolic panel     Status: Abnormal   Collection Time: 02/05/18  5:03 PM  Result Value Ref Range   Sodium 139 135 - 145 mmol/L   Potassium 3.4 (L) 3.5 - 5.1 mmol/L   Chloride 108 98 - 111 mmol/L   CO2 25 22 - 32 mmol/L   Glucose, Bld 102 (H) 70 - 99 mg/dL   BUN 7 6 - 20 mg/dL   Creatinine, Ser 7.25 0.44 - 1.00 mg/dL   Calcium 9.0 8.9 - 36.6 mg/dL   Total Protein 5.8 (L) 6.5 - 8.1 g/dL   Albumin 3.5 3.5 - 5.0 g/dL   AST 15 15 - 41 U/L   ALT 13 0 - 44 U/L   Alkaline Phosphatase 46 38 - 126 U/L   Total Bilirubin 0.5 0.3 - 1.2 mg/dL   GFR calc non Af Amer >60 >60 mL/min   GFR calc Af Amer >60 >60 mL/min   Anion gap 6 5 - 15    Comment: Performed at [facility], [address] [place], [place] [address]  Acetaminophen level     Status: Abnormal   Collection Time: 02/05/18  5:03 PM  Result Value Ref Range   Acetaminophen (Tylenol), Serum <10 (L) 10 - 30 ug/mL    Comment: (NOTE) Therapeutic concentrations vary significantly. A range of 10-30 ug/mL  may be an effective concentration for many patients. However, some  are best treated at concentrations outside of this range. Acetaminophen concentrations >150 ug/mL at 4 hours after ingestion  and >50 ug/mL at 12 hours after ingestion are often associated with  toxic reactions. Performed at [facility], [address], [place], [place] [address]   Salicylate level     Status: None   Collection Time: 02/05/18  5:03 PM  Result Value Ref Range   Salicylate Lvl <7.0 2.8 - 30.0 mg/dL    Comment: Performed at [facility], [address] [place], [place] [address]   Rapid HIV screen (HIV 1/2 Ab+Ag)     Status: None   Collection Time: 02/05/18  5:03 PM  Result Value Ref Range   HIV-1 P24 Antigen - HIV24 NON REACTIVE NON REACTIVE   HIV 1/2 Antibodies NON REACTIVE NON REACTIVE   Interpretation (HIV Ag Ab)      A non reactive test result means that HIV 1 or HIV 2  antibodies and HIV 1 p24 antigen were not detected in the specimen.    Comment: Performed at [facility], [address], [place], [place] [address]    Medications:  Current Facility-Administered Medications  Medication Dose Route Frequency Provider Last Rate Last Dose  . acetaminophen (TYLENOL) tablet 1,000 mg  1,000 mg Oral Once Antoine Primas, MD      . metroNIDAZOLE (FLAGYL) tablet 2,000 mg  2,000 mg Oral Once Antoine Primas, MD       Current Outpatient Medications  Medication Sig Dispense Refill  . penicillin v potassium (VEETID) 500 MG tablet Take 1 tablet (500 mg total) by mouth 4 (four) times daily for 7 days. 28 tablet 0    Musculoskeletal: Strength & Muscle Tone: within normal limits Gait & Station: normal Patient leans: N/A patient was observed walking to tele assessment monitor   Psychiatric Specialty Exam: Physical Exam  Vitals reviewed. Cardiovascular: Normal rate.  Psychiatric: She has a normal mood and affect. Her behavior is normal.    Review of Systems  Musculoskeletal: Negative for joint pain.  Psychiatric/Behavioral: Positive for depression. Negative for suicidal ideas. The patient is nervous/anxious.   All other systems reviewed and are negative.   Blood pressure 120/83, pulse 77, temperature 98.2 F (36.8 C), temperature source Oral, resp. rate 17, height 5' 3.5" (1.613 m), weight 63 kg, last menstrual period 02/05/2018, SpO2 100 %.Body mass index is 24.24 kg/m.  General Appearance: Casual- paper scrubs   Eye Contact:  Fair  Speech:  Clear and Coherent and slightly pressured  Volume:  Normal  Mood:  Anxious and Depressed  Affect:  Congruent   Thought Process:  Coherent  Orientation:  Full (Time, Place, and Person)  Thought Content:  Hallucinations: None  Suicidal Thoughts:  No  Homicidal Thoughts:  No  Memory:  Immediate;   Fair Recent;   Fair Remote;   Fair  Judgement:  Fair  Insight:  Fair  Psychomotor Activity:  Normal  Concentration:  Concentration: Fair  Recall:  Good  Fund of Knowledge:  Fair  Language:  Fair  Akathisia:  No  Handed:    AIMS (if indicated):     Assets:  Communication Skills Desire for Improvement Resilience Social Support  ADL's:  Intact  Cognition:  WNL  Sleep:        Disposition: No evidence of imminent risk to self or others at present.   Patient does not meet criteria for psychiatric inpatient admission. Supportive therapy provided about ongoing stressors. Refer to IOP. Discussed crisis plan, support from social network, calling 911, coming to the Emergency Department, and calling Suicide Hotline.  This service was provided via telemedicine using a 2-way, interactive audio and video technology.  Names of all persons participating in this telemedicine service and their role in this encounter. Name: Erica [name]  Role: patient   Name: t. Role: NP    Oneta Rack, NP 02/05/2018 6:30 PM

## 2018-02-05 NOTE — ED Triage Notes (Signed)
[facility] called to report PT may be DC home.

## 2018-02-05 NOTE — ED Triage Notes (Signed)
PT in X-ray dept

## 2018-02-05 NOTE — ED Notes (Addendum)
Went in room to capture 12-lead per DR order, upon entrance pt states "Yall arent just gonna fucking do anything to me im not fucking crazy, I wanna speak to the DR" . This tech left the room and informed RN.

## 2018-02-05 NOTE — ED Triage Notes (Signed)
Pt arrives c/o abscess to right tooth, urinary frequency and burning, suicidal thoughts with a plan, drug abuse.

## 2018-02-05 NOTE — [facility] Counselor (Signed)
TTS attempted to complete tele-psych with patient. Magda Paganini, RN, report now was not a good time to see patient. TTS will attempt to assessment patient later this date. Magda Paganini is at extension [phone].

## 2018-02-07 LAB — URINE CULTURE

## 2018-02-07 LAB — GC/CHLAMYDIA PROBE AMP (~~LOC~~) NOT AT ARMC
Chlamydia: NEGATIVE
Neisseria Gonorrhea: NEGATIVE

## 2018-04-08 ENCOUNTER — Encounter (HOSPITAL_COMMUNITY): Payer: Self-pay

## 2018-04-08 ENCOUNTER — Ambulatory Visit (HOSPITAL_COMMUNITY)
Admission: EM | Admit: 2018-04-08 | Discharge: 2018-04-08 | Disposition: A | Discharge status: Home / Self Care | Payer: Self-pay | Attending: Family Medicine | Admitting: Family Medicine

## 2018-04-08 DIAGNOSIS — K047 Periapical abscess without sinus: Secondary | ICD-10-CM

## 2018-04-08 DIAGNOSIS — R220 Localized swelling, mass and lump, head: Secondary | ICD-10-CM

## 2018-04-08 MED ORDER — CLINDAMYCIN HCL 300 MG PO CAPS: 300.0000 mg | ORAL_CAPSULE | Freq: Three times a day (TID) | ORAL | 0 refills | Status: AC

## 2018-04-08 MED ORDER — PREDNISONE 20 MG PO TABS: 40.0000 mg | ORAL_TABLET | Freq: Every day | ORAL | 0 refills | Status: AC

## 2018-04-08 MED ORDER — METHYLPREDNISOLONE SODIUM SUCC 125 MG IJ SOLR
INTRAMUSCULAR | Status: AC
  Filled 2018-04-08: qty 2

## 2018-04-08 MED ORDER — METHYLPREDNISOLONE SODIUM SUCC 125 MG IJ SOLR
125.0000 mg | Freq: Once | INTRAMUSCULAR | Status: AC
  Administered 2018-04-08: 125 mg via INTRAMUSCULAR

## 2018-04-08 MED ORDER — IBUPROFEN 800 MG PO TABS: 800.0000 mg | ORAL_TABLET | Freq: Three times a day (TID) | ORAL | 0 refills | Status: DC

## 2018-04-08 NOTE — Discharge Instructions (Addendum)
If symptoms of facial swelling worsens, go immediately to the ER for further evaluation.  [facility] Address: [address] [place], [place], [place] [address]  Phone: [phone]

## 2018-04-08 NOTE — ED Triage Notes (Signed)
Pt present right side dental abscess. Symptoms started two days.

## 2018-04-08 NOTE — ED Provider Notes (Signed)
[facility]    [name]: [id number] Arrival date & time: 04/08/18  1314     History   Chief Complaint Chief Complaint  Patient presents with  . Abscess    HPI Erica [name] is a 38 y.o. female.   HPI  Patient presents with a complaint of a dental abscess. Patient has a history of drug use and chronic dental problems in review of the medical record. Patient is very agitated when I entered the room. She is demanding medication and treatment of her dental pain. When explained that she would be provided a prescription after she is evaluated, patient became irate and stated " I don't have money for medication". In review of EMR patient has presented to the ER multiple times with complaints of tooth pain. She reports developing right sided facial pain and swelling x 2 days ago. She has not taken anything for pain. No recent dental evaluation. Past Medical History:  Diagnosis Date  . Abnormal Pap smear   . Anxiety    as teen  . Anxiety   . Depression    as teen  . GERD (gastroesophageal reflux disease)   . Headache(784.0)   . No pertinent past medical history   . VBAC, delivered, current hospitalization 05/19/2011    Patient Active Problem List   Diagnosis Date Noted  . Acute appendicitis 07/11/2016  . URI (upper respiratory infection) 09/23/2010  . Right otitis media 09/23/2010  . Positive pregnancy test 09/23/2010    Past Surgical History:  Procedure Laterality Date  . APPENDECTOMY    . CESAREAN SECTION    . COLPOSCOPY    . DILATION AND CURETTAGE OF UTERUS  2010  . LAPAROSCOPIC APPENDECTOMY N/A 07/11/2016   Procedure: APPENDECTOMY LAPAROSCOPIC;  Surgeon: Ricarda Frame, MD;  Location: [facility] ORS;  Service: General;  Laterality: N/A;  . WISDOM TOOTH EXTRACTION      OB History    Gravida  7   Para  6   Term  4   Preterm  2   AB  1   Living  6     SAB  1   TAB  0   Ectopic  0   Multiple  0   Live Births  6            Home Medications    Prior to Admission medications   Not on File    Family History Family History  Problem Relation Age of Onset  . Hypertension Mother   . Seizures Mother   . Hypertension Maternal Grandmother   . Arthritis Maternal Grandmother   . Cancer Maternal Grandmother        breast  . Stroke Maternal Grandfather   . Seizures Brother   . Mental illness Brother        paranoid schizophrenia  . Birth defects Daughter        hole in heart  . Seizures Maternal Aunt   . Seizures Brother   . Seizures Cousin   . Seizures Cousin   . Mental retardation Cousin     Social History Social History   Tobacco Use  . Smoking status: Current Every Day Smoker    Packs/day: 1.00    Years: 13.00    Pack years: 13.00    Types: Cigarettes  . Smokeless tobacco: Never Used  Substance Use Topics  . Alcohol use: Yes    Comment: episodic  . Drug use: Yes    Frequency: 5.0 times per week  Types: Cocaine    Comment: +coc     Allergies   Levaquin [levofloxacin in d5w]   Review of Systems Review of Systems Pertinent negatives listed in HPI Physical Exam Triage Vital Signs ED Triage Vitals [04/08/18 1445]  Enc Vitals Group     BP 121/84     Pulse Rate 81     Resp      Temp 98.4 F (36.9 C)     Temp Source Oral     SpO2 100 %     Weight      Height      Head Circumference      Peak Flow      Pain Score 10     Pain Loc      Pain Edu?      Excl. in GC?    No data found.  Updated Vital Signs BP 121/84 (BP Location: Right Arm)   Pulse 81   Temp 98.4 F (36.9 C) (Oral)   LMP 03/30/2018   SpO2 100%   Visual Acuity Right Eye Distance:   Left Eye Distance:   Bilateral Distance:    Right Eye Near:   Left Eye Near:    Bilateral Near:     Physical Exam General appearance: alert, well developed, well nourished, cooperative and in no distress Head: Right lower jaw swelling. No erythema. Tenderness with palpation ENT: Poor dentition Respiratory: Respirations even and unlabored,  normal respiratory rate Heart: rate and rhythm normal. No gallop or murmurs noted on exam  Abdomen: BS +, no distention, no rebound tenderness, or no mass Extremities: No gross deformities Skin: Skin color, texture, turgor normal. No rashes seen  Psych: Appropriate mood and affect. Neurologic: Mental status: Alert, oriented to person, place, and time, thought content appropriate.  UC Treatments / Results  Labs (all labs ordered are listed, but only abnormal results are displayed) Labs Reviewed - No data to display  EKG None  Radiology No results found.  Procedures Procedures (including critical care time)  Medications Ordered in UC Medications - No data to display  Initial Impression / Assessment and Plan / UC Course  I have reviewed the triage vital signs and the nursing notes.  Pertinent labs & imaging results that were available during my care of the patient were reviewed by me and considered in my medical decision making (see chart for details).    Final Clinical Impressions(s) / UC Diagnoses   Final diagnoses:  Dental abscess  Facial swelling     Discharge Instructions     If symptoms of facial swelling worsens, go immediately to the ER for further evaluation.  [facility] Address: [address] [place], [place] [address]  Phone: [phone]    ED Prescriptions    Medication Sig Dispense Auth. Provider   clindamycin (CLEOCIN) 300 MG capsule Take 1 capsule (300 mg total) by mouth 3 (three) times daily for 10 days. 30 capsule Bing Neighbors, FNP   ibuprofen (ADVIL,MOTRIN) 800 MG tablet Take 1 tablet (800 mg total) by mouth 3 (three) times daily. 21 tablet Bing Neighbors, FNP   predniSONE (DELTASONE) 20 MG tablet Take 2 tablets (40 mg total) by mouth daily with breakfast for 5 days. Prednisone 20 mg, in mornings with breakfast 10 tablet Bing Neighbors, FNP     Controlled Substance Prescriptions [place]  Controlled Substance Registry consulted? Not Applicable   Bing Neighbors, FNP 04/08/18 1616

## 2018-04-09 ENCOUNTER — Telehealth (HOSPITAL_COMMUNITY): Payer: Self-pay | Admitting: Family Medicine

## 2018-04-09 MED ORDER — AMOXICILLIN 500 MG PO CAPS: 500.0000 mg | ORAL_CAPSULE | Freq: Three times a day (TID) | ORAL | 0 refills | Status: DC

## 2018-04-09 NOTE — Telephone Encounter (Signed)
Patient cannot afford clindamycin.  I prescribed for her amoxicillin instead, it is about half the price

## 2018-05-14 ENCOUNTER — Inpatient Hospital Stay (HOSPITAL_COMMUNITY): Payer: Self-pay

## 2018-05-14 ENCOUNTER — Encounter (HOSPITAL_COMMUNITY): Payer: Self-pay

## 2018-05-14 ENCOUNTER — Other Ambulatory Visit: Payer: Self-pay

## 2018-05-14 ENCOUNTER — Inpatient Hospital Stay (HOSPITAL_COMMUNITY)
Admission: AD | Admit: 2018-05-14 | Discharge: 2018-05-15 | Disposition: A | Discharge status: Home / Self Care | Payer: Self-pay | Attending: Obstetrics & Gynecology | Admitting: Obstetrics & Gynecology

## 2018-05-14 DIAGNOSIS — Z3A01 Less than 8 weeks gestation of pregnancy: Secondary | ICD-10-CM | POA: Insufficient documentation

## 2018-05-14 DIAGNOSIS — F17210 Nicotine dependence, cigarettes, uncomplicated: Secondary | ICD-10-CM | POA: Insufficient documentation

## 2018-05-14 DIAGNOSIS — A599 Trichomoniasis, unspecified: Secondary | ICD-10-CM | POA: Insufficient documentation

## 2018-05-14 DIAGNOSIS — O209 Hemorrhage in early pregnancy, unspecified: Secondary | ICD-10-CM

## 2018-05-14 DIAGNOSIS — O200 Threatened abortion: Secondary | ICD-10-CM | POA: Insufficient documentation

## 2018-05-14 DIAGNOSIS — O208 Other hemorrhage in early pregnancy: Secondary | ICD-10-CM | POA: Insufficient documentation

## 2018-05-14 DIAGNOSIS — R42 Dizziness and giddiness: Secondary | ICD-10-CM | POA: Insufficient documentation

## 2018-05-14 DIAGNOSIS — O26891 Other specified pregnancy related conditions, first trimester: Secondary | ICD-10-CM | POA: Insufficient documentation

## 2018-05-14 DIAGNOSIS — R109 Unspecified abdominal pain: Secondary | ICD-10-CM | POA: Insufficient documentation

## 2018-05-14 DIAGNOSIS — O4690 Antepartum hemorrhage, unspecified, unspecified trimester: Secondary | ICD-10-CM

## 2018-05-14 LAB — URINALYSIS, ROUTINE W REFLEX MICROSCOPIC
Bacteria, UA: NONE SEEN
Bilirubin Urine: NEGATIVE
Glucose, UA: NEGATIVE mg/dL
Ketones, ur: NEGATIVE mg/dL
Nitrite: NEGATIVE
Protein, ur: 30 mg/dL — AB
RBC / HPF: >50 RBC/hpf — ABNORMAL HIGH (ref 0–5)
Specific Gravity, Urine: 1.023 (ref 1.005–1.030)
pH: 9 — ABNORMAL HIGH (ref 5.0–8.0)

## 2018-05-14 LAB — RAPID URINE DRUG SCREEN, HOSP PERFORMED
Amphetamines: NOT DETECTED
Barbiturates: NOT DETECTED
Benzodiazepines: NOT DETECTED
Cocaine: POSITIVE — AB
Opiates: NOT DETECTED
Tetrahydrocannabinol: NOT DETECTED

## 2018-05-14 LAB — HCG, QUANTITATIVE, PREGNANCY: hCG, Beta Chain, Quant, S: 3969 m[IU]/mL — ABNORMAL HIGH (ref <5)

## 2018-05-14 LAB — CBC
HCT: 33.9 % — ABNORMAL LOW (ref 36.0–46.0)
Hemoglobin: 11.4 g/dL — ABNORMAL LOW (ref 12.0–15.0)
MCH: 32.6 pg (ref 26.0–34.0)
MCHC: 33.6 g/dL (ref 30.0–36.0)
MCV: 96.9 fL (ref 80.0–100.0)
Platelets: 264 10*3/uL (ref 150–400)
RBC: 3.5 MIL/uL — ABNORMAL LOW (ref 3.87–5.11)
RDW: 13.1 % (ref 11.5–15.5)
WBC: 6.4 10*3/uL (ref 4.0–10.5)
nRBC: 0 % (ref 0.0–0.2)

## 2018-05-14 LAB — ABO/RH: ABO/RH(D): O POS

## 2018-05-14 LAB — POCT PREGNANCY, URINE: Preg Test, Ur: POSITIVE — AB

## 2018-05-14 NOTE — MAU Note (Signed)
Pt received by EMS with c/o cramping and vaginal bleeding. Thinks she passed "fetus yesterday."

## 2018-05-14 NOTE — MAU Provider Note (Addendum)
History     [name]: [id number]  Arrival date and time: 05/14/18 2014   First Provider Initiated Contact with Patient 05/14/18 2133      Chief Complaint  Patient presents with  . Vaginal Bleeding  . Abdominal Pain   Ms. Erica [name] is a 38 y.o. [phone] at [redacted]w[redacted]d who presents to MAU for vaginal bleeding which began 4days ago. Pt reports vaginal bleeding has slowly increased over the past 4days and she passed a large clot today which prompted her to call EMS and be taken to MAU.  Pt is difficult to gather a history from as she is continuously falling asleep in between each question/answer and sentence. Pt needed to be aroused multiple times during conversation by verbal commands. Pt denies opioid use, but reports cocaine use as recently as yesterday. Pt reports she is tired because she has not had a good night's sleep in a week.  Passing blood clots? yes Blood soaking clothes? no Lightheaded/dizzy? yes Significant pelvic pain or cramping? no, mild cramps Passed any tissue? pt reports she "passed a fetus-like thing" that looks like images she found online  Current pregnancy problems? pt has not yet been seen by a provider Allergies? levaquin Current medications? none  Pt denies vaginal discharge/odor/itching. Pt denies N/V, abdominal pain, constipation, diarrhea, or urinary problems. Pt denies fever, chills, fatigue, sweating or changes in appetite. Pt denies SOB or chest pain. Pt denies HA, weakness.   OB History    Gravida  8   Para  6   Term  4   Preterm  2   AB  1   Living  6     SAB  1   TAB  0   Ectopic  0   Multiple  0   Live Births  6           Past Medical History:  Diagnosis Date  . Abnormal Pap smear   . Anxiety    as teen  . Anxiety   . Depression    as teen  . GERD (gastroesophageal reflux disease)   . Headache(784.0)   . No pertinent past medical history   . VBAC, delivered, current hospitalization 05/19/2011    Past Surgical  History:  Procedure Laterality Date  . APPENDECTOMY    . CESAREAN SECTION    . COLPOSCOPY    . DILATION AND CURETTAGE OF UTERUS  2010  . LAPAROSCOPIC APPENDECTOMY N/A 07/11/2016   Procedure: APPENDECTOMY LAPAROSCOPIC;  Surgeon: Ricarda Frame, MD;  Location: [facility] ORS;  Service: General;  Laterality: N/A;  . WISDOM TOOTH EXTRACTION      Family History  Problem Relation Age of Onset  . Hypertension Mother   . Seizures Mother   . Hypertension Maternal Grandmother   . Arthritis Maternal Grandmother   . Cancer Maternal Grandmother        breast  . Stroke Maternal Grandfather   . Seizures Brother   . Mental illness Brother        paranoid schizophrenia  . Birth defects Daughter        hole in heart  . Seizures Maternal Aunt   . Seizures Brother   . Seizures Cousin   . Seizures Cousin   . Mental retardation Cousin     Social History   Tobacco Use  . Smoking status: Current Every Day Smoker    Packs/day: 1.00    Years: 13.00    Pack years: 13.00    Types: Cigarettes  .  Smokeless tobacco: Never Used  Substance Use Topics  . Alcohol use: Yes    Comment: episodic  . Drug use: Yes    Frequency: 5.0 times per week    Types: Cocaine    Comment: +coc    Allergies:  Allergies  Allergen Reactions  . Levaquin [Levofloxacin In D5w] Swelling    Lip swelling, hives, chest tightness    Medications Prior to Admission  Medication Sig Dispense Refill Last Dose  . amoxicillin (AMOXIL) 500 MG capsule Take 1 capsule (500 mg total) by mouth 3 (three) times daily. 30 capsule 0 More than a month at Unknown time  . ibuprofen (ADVIL,MOTRIN) 800 MG tablet Take 1 tablet (800 mg total) by mouth 3 (three) times daily. 21 tablet 0 More than a month at Unknown time    Review of Systems  Constitutional: Positive for fatigue. Negative for chills, diaphoresis and fever.  Respiratory: Negative for shortness of breath.   Cardiovascular: Negative for chest pain.  Gastrointestinal: Negative for  abdominal pain, constipation, diarrhea, nausea and vomiting.  Genitourinary: Positive for pelvic pain and vaginal bleeding. Negative for dysuria, flank pain, frequency, urgency and vaginal discharge.  Neurological: Positive for dizziness and light-headedness. Negative for weakness and headaches.   Physical Exam   Blood pressure 108/63, pulse 73, temperature 98.2 F (36.8 C), temperature source Oral, resp. rate 16, height  (1.626 m), weight 60.3 kg, last menstrual period 03/27/2018, SpO2 98 %.   Patient Vitals for the past 24 hrs:  BP Temp Temp src Pulse Resp SpO2 Height Weight  05/15/18 0059 108/63 98.2 F (36.8 C) Oral 73 16 98 % - -  05/14/18 2304 114/63 - - 65 18 - - -  05/14/18 2115 - - - - - 98 % - -  05/14/18 2110 - - - - - 98 % - -  05/14/18 2028 (!) 116/58 - - 87 18 - - -  05/14/18 2026 119/75 98.3 F (36.8 C) Oral 93 19 -  (1.626 m) 60.3 kg   Physical Exam  Constitutional: She is oriented to person, place, and time. She appears well-developed and well-nourished. She appears lethargic. No distress.  HENT:  Head: Normocephalic and atraumatic.  Eyes: Pupils are equal, round, and reactive to light.  Neck: Normal range of motion.  Cardiovascular: Normal rate.  Respiratory: Effort normal.  GI: Soft.  Genitourinary:    Genitourinary Comments: Uterus: non-tender, SE: cervix is smooth, pink, no lesions, moderate amt of dark, red blood in vaginal vault removed with 4 large cotton swabs -- WP, GC/CT done, closed/long/firm, no CMT or friability, no adnexal tenderness    Musculoskeletal: Normal range of motion.  Neurological: She is oriented to person, place, and time. She appears lethargic.  Skin: Skin is warm and dry. She is not diaphoretic.  Psychiatric: She has a normal mood and affect. Judgment and thought content normal. She is slowed.  Patient appears very sleepy and hard to stay awake She is inattentive.    MAU Course  Procedures  MDM -r/o  ectopic -UDS: -CBC: -[place]: -hCG: -ABO: -WetPrep:  -GC/CT:  -care transferred to Raelyn Mora, CNM @ 2157  Marylen Ponto, NP  9:57 PM 05/14/2018  - Flagyl 2000 mg po (prior to d/c home)  Results for orders placed or performed during the hospital encounter of 05/14/18 (from the past 24 hour(s))  Rapid urine drug screen (hospital performed)     Status: Abnormal   Collection Time: 05/14/18  9:32 PM  Result Value  Ref Range   Opiates NONE DETECTED NONE DETECTED   Cocaine POSITIVE (A) NONE DETECTED   Benzodiazepines NONE DETECTED NONE DETECTED   Amphetamines NONE DETECTED NONE DETECTED   Tetrahydrocannabinol NONE DETECTED NONE DETECTED   Barbiturates NONE DETECTED NONE DETECTED  Urinalysis, Routine w reflex microscopic     Status: Abnormal   Collection Time: 05/14/18  9:32 PM  Result Value Ref Range   Color, Urine YELLOW YELLOW   APPearance HAZY (A) CLEAR   Specific Gravity, Urine 1.023 1.005 - 1.030   pH 9.0 (H) 5.0 - 8.0   Glucose, UA NEGATIVE NEGATIVE mg/dL   Hgb urine dipstick MODERATE (A) NEGATIVE   Bilirubin Urine NEGATIVE NEGATIVE   Ketones, ur NEGATIVE NEGATIVE mg/dL   Protein, ur 30 (A) NEGATIVE mg/dL   Nitrite NEGATIVE NEGATIVE   Leukocytes,Ua TRACE (A) NEGATIVE   RBC / HPF >50 (H) 0 - 5 RBC/hpf   WBC, UA 11-20 0 - 5 WBC/hpf   Bacteria, UA NONE SEEN NONE SEEN   Squamous Epithelial / LPF 0-5 0 - 5   Mucus PRESENT   CBC     Status: Abnormal   Collection Time: 05/14/18  9:48 PM  Result Value Ref Range   WBC 6.4 4.0 - 10.5 K/uL   RBC 3.50 (L) 3.87 - 5.11 MIL/uL   Hemoglobin 11.4 (L) 12.0 - 15.0 g/dL   HCT 16.1 (L) 09.6 - 04.5 %   MCV 96.9 80.0 - 100.0 fL   MCH 32.6 26.0 - 34.0 pg   MCHC 33.6 30.0 - 36.0 g/dL   RDW 40.9 81.1 - 91.4 %   Platelets 264 150 - 400 K/uL   nRBC 0.0 0.0 - 0.2 %  hCG, quantitative, pregnancy     Status: Abnormal   Collection Time: 05/14/18  9:48 PM  Result Value Ref Range   hCG, Beta Chain, Quant, S 3,969 (H) <5 mIU/mL  ABO/Rh      Status: None   Collection Time: 05/14/18  9:48 PM  Result Value Ref Range   ABO/RH(D)      O POS Performed at [facility], [address], [place], [place] [address]   Pregnancy, urine POC     Status: Abnormal   Collection Time: 05/14/18 10:20 PM  Result Value Ref Range   Preg Test, Ur POSITIVE (A) NEGATIVE  Wet prep, genital     Status: Abnormal   Collection Time: 05/14/18 11:58 PM  Result Value Ref Range   Yeast Wet Prep HPF POC NONE SEEN NONE SEEN   Trich, Wet Prep PRESENT (A) NONE SEEN   Clue Cells Wet Prep HPF POC NONE SEEN NONE SEEN   WBC, Wet Prep HPF POC FEW (A) NONE SEEN   Sperm NONE SEEN     US Ob Less Than 14 Weeks With Ob Transvaginal  Result Date: 05/14/2018 CLINICAL DATA:  Vaginal bleeding in pregnancy EXAM: OBSTETRIC <14 WK [place] AND TRANSVAGINAL OB US TECHNIQUE: Both transabdominal and transvaginal ultrasound examinations were performed for complete evaluation of the gestation as well as the maternal uterus, adnexal regions, and pelvic cul-de-sac. Transvaginal technique was performed to assess early pregnancy. COMPARISON:  None. FINDINGS: Intrauterine gestational sac: Single, irregular in shape. Appears mobile within the endometrial canal. Yolk sac:  Not visualized Embryo:  Not visualized Cardiac Activity: Not visualized Heart Rate:   bpm MSD: 3.8 mm   5 w   0 d CRL:    mm    w    d  US EDC: Subchorionic hemorrhage:  Moderate subchorionic hemorrhage Maternal uterus/adnexae: No adnexal mass or free fluid. IMPRESSION: Single irregularly shaped gestational sac without yolk sac or fetal pole. There is moderate subchorionic hemorrhage. The gestational sac appears mobile within the endometrial canal. Findings concerning for abortion in progress. Recommend serial quantitative HCG and repeat ultrasound in 10-14 days. Electronically Signed   By: Charlett NoseKevin  Dover M.D.   On: 05/14/2018 22:55    Assessment and Plan  Threatened miscarriage in early pregnancy - Information  provided on threatened miscarriage  - Rpt HCG on Wednesday 05/17/2018 @ [facility] // msg sent to [employer] [employer]  Vaginal bleeding affecting early pregnancy - Information provided on VB in preg  - Return to MAU:  If you have heavier bleeding that soaks through more that 2 pads per hour for an hour or more  If you bleed so much that you feel like you might pass out or you do pass out  If you have significant abdominal pain that is not improved with Tylenol   If you develop a fever > 100.5  Trichomoniasis - Tx'd with Flagyl 2000 mg po in MAU - Information provided on trichomoniasis  - Discharge home - Patient verbalized an understanding of the plan of care and agrees.    Raelyn Moraolitta Martavius Lusty, MSN, CNM 05/15/2018, 11:34 PM

## 2018-05-14 NOTE — MAU Note (Signed)
Patient reports cocaine use 05/12/2017.  Patient appears to be sleepy but responds to voice commands.

## 2018-05-15 ENCOUNTER — Encounter (HOSPITAL_COMMUNITY): Payer: Self-pay | Admitting: Obstetrics and Gynecology

## 2018-05-15 DIAGNOSIS — O209 Hemorrhage in early pregnancy, unspecified: Secondary | ICD-10-CM | POA: Diagnosis present

## 2018-05-15 DIAGNOSIS — O208 Other hemorrhage in early pregnancy: Secondary | ICD-10-CM

## 2018-05-15 DIAGNOSIS — A599 Trichomoniasis, unspecified: Secondary | ICD-10-CM | POA: Diagnosis present

## 2018-05-15 DIAGNOSIS — O200 Threatened abortion: Secondary | ICD-10-CM | POA: Diagnosis present

## 2018-05-15 LAB — WET PREP, GENITAL
Clue Cells Wet Prep HPF POC: NONE SEEN
Sperm: NONE SEEN
Yeast Wet Prep HPF POC: NONE SEEN

## 2018-05-15 LAB — GC/CHLAMYDIA PROBE AMP (~~LOC~~) NOT AT ARMC
Chlamydia: NEGATIVE
Neisseria Gonorrhea: NEGATIVE

## 2018-05-15 MED ORDER — METRONIDAZOLE 500 MG PO TABS
2000.0000 mg | ORAL_TABLET | Freq: Once | ORAL | Status: AC
  Administered 2018-05-15: 2000 mg via ORAL
  Filled 2018-05-15: qty 4

## 2018-05-15 NOTE — MAU Note (Signed)
Patient fully discharged but remains in room at this time. Patient made aware that she can not wait for ride in room.  Patient asked to change clothes and wait in lobby.

## 2018-05-16 ENCOUNTER — Telehealth: Payer: Self-pay | Admitting: Family Medicine

## 2018-05-16 NOTE — Telephone Encounter (Signed)
Attempted to call patient to inform her about office's process and new address. No answer, left voicemail with this information.

## 2018-05-17 ENCOUNTER — Ambulatory Visit: Payer: Self-pay

## 2018-06-01 ENCOUNTER — Ambulatory Visit (HOSPITAL_COMMUNITY): Admission: RE | Admit: 2018-06-01 | Payer: Self-pay | Source: Ambulatory Visit
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
## 2018-09-06 ENCOUNTER — Ambulatory Visit (HOSPITAL_COMMUNITY)
Admission: EM | Admit: 2018-09-06 | Discharge: 2018-09-06 | Disposition: A | Discharge status: Home / Self Care | Payer: Self-pay | Attending: Family Medicine | Admitting: Family Medicine

## 2018-09-06 ENCOUNTER — Encounter (HOSPITAL_COMMUNITY): Payer: Self-pay

## 2018-09-06 ENCOUNTER — Other Ambulatory Visit: Payer: Self-pay

## 2018-09-06 DIAGNOSIS — Z20828 Contact with and (suspected) exposure to other viral communicable diseases: Secondary | ICD-10-CM | POA: Insufficient documentation

## 2018-09-06 DIAGNOSIS — N390 Urinary tract infection, site not specified: Secondary | ICD-10-CM | POA: Insufficient documentation

## 2018-09-06 DIAGNOSIS — Z1159 Encounter for screening for other viral diseases: Secondary | ICD-10-CM

## 2018-09-06 DIAGNOSIS — Z881 Allergy status to other antibiotic agents status: Secondary | ICD-10-CM | POA: Insufficient documentation

## 2018-09-06 DIAGNOSIS — J020 Streptococcal pharyngitis: Secondary | ICD-10-CM | POA: Insufficient documentation

## 2018-09-06 DIAGNOSIS — Z3202 Encounter for pregnancy test, result negative: Secondary | ICD-10-CM

## 2018-09-06 DIAGNOSIS — J029 Acute pharyngitis, unspecified: Secondary | ICD-10-CM

## 2018-09-06 DIAGNOSIS — Z113 Encounter for screening for infections with a predominantly sexual mode of transmission: Secondary | ICD-10-CM | POA: Insufficient documentation

## 2018-09-06 DIAGNOSIS — F17210 Nicotine dependence, cigarettes, uncomplicated: Secondary | ICD-10-CM | POA: Insufficient documentation

## 2018-09-06 LAB — POCT URINALYSIS DIP (DEVICE)
Bilirubin Urine: NEGATIVE
Glucose, UA: NEGATIVE mg/dL
Ketones, ur: NEGATIVE mg/dL
Nitrite: NEGATIVE
Protein, ur: 30 mg/dL — AB
Specific Gravity, Urine: 1.02 (ref 1.005–1.030)
Urobilinogen, UA: 0.2 mg/dL (ref 0.0–1.0)
pH: 7.5 (ref 5.0–8.0)

## 2018-09-06 LAB — POCT RAPID STREP A: Streptococcus, Group A Screen (Direct): POSITIVE — AB

## 2018-09-06 LAB — POCT PREGNANCY, URINE: Preg Test, Ur: NEGATIVE

## 2018-09-06 MED ORDER — AZITHROMYCIN 250 MG PO TABS
1000.0000 mg | ORAL_TABLET | Freq: Once | ORAL | Status: AC
  Administered 2018-09-06: 1000 mg via ORAL

## 2018-09-06 MED ORDER — CEFTRIAXONE SODIUM 250 MG IJ SOLR
250.0000 mg | Freq: Once | INTRAMUSCULAR | Status: AC
  Administered 2018-09-06: 250 mg via INTRAMUSCULAR

## 2018-09-06 MED ORDER — CEPHALEXIN 500 MG PO CAPS: 500.0000 mg | ORAL_CAPSULE | Freq: Four times a day (QID) | ORAL | 0 refills | Status: AC

## 2018-09-06 MED ORDER — CEFTRIAXONE SODIUM 250 MG IJ SOLR
INTRAMUSCULAR | Status: AC
  Filled 2018-09-06: qty 250

## 2018-09-06 MED ORDER — LIDOCAINE HCL (PF) 1 % IJ SOLN
INTRAMUSCULAR | Status: AC
  Filled 2018-09-06: qty 2

## 2018-09-06 MED ORDER — AZITHROMYCIN 250 MG PO TABS
ORAL_TABLET | ORAL | Status: AC
  Filled 2018-09-06: qty 4

## 2018-09-06 MED ORDER — FLUCONAZOLE 150 MG PO TABS: 150.0000 mg | ORAL_TABLET | Freq: Once | ORAL | 0 refills | Status: AC

## 2018-09-06 NOTE — Discharge Instructions (Signed)
Sore Throat  Your rapid strep test was positive today. We will treat you for strep throat with an antibiotic. Please take keflex for the next 10 days to also cover UTI  Please continue Tylenol or Ibuprofen for fever and pain. May try salt water gargles, cepacol lozenges, throat spray, or OTC cold relief medicine for throat discomfort. If you also have congestion take a daily anti-histamine like Zyrtec, Claritin, and a oral decongestant to help with post nasal drip that may be irritating your throat.   Stay hydrated and drink plenty of fluids to keep your throat coated relieve irritation.

## 2018-09-06 NOTE — ED Triage Notes (Signed)
Pt cc sore throat x 2 days . Pt states she has a odor when she voids and burning sensation. Pt wants to be tested for STDs. Left ear discomfort. X 2 weeks.

## 2018-09-07 NOTE — ED Provider Notes (Signed)
[place]    [name]: [id number] Arrival date & time: 09/06/18  1609      History   Chief Complaint Chief Complaint  Patient presents with  . Urinary Tract Infection  . Sore Throat    HPI Erica [name] is a 38 y.o. female history of miscarriage recently in April, presenting today for evaluation of sore throat, possible UTI as well as STD screening.  Patient states that over the past 2 weeks she has had ear discomfort, more recently in the past 2 to 3 days she has developed a sore throat.  Feels swelling in the back of her throat and discomfort with swallowing.  Denies associated cough or congestion.  Denies known exposure to COVID.  Denies any fevers chills or body aches.  She is also concerned about possible UTI as she has had a lot of back discomfort and has had dysuria, increased frequency and incomplete voiding.  She also believes that she has an STD and would like to be screened and treated for this as well.  She would like to have empiric treatment today as she is concerned about cost and feels she will not be able to return for treatment if positive.  She has a high suspicion given her new partner.  HPI  Past Medical History:  Diagnosis Date  . Abnormal Pap smear   . Anxiety    as teen  . Anxiety   . Depression    as teen  . GERD (gastroesophageal reflux disease)   . Headache(784.0)   . No pertinent past medical history   . VBAC, delivered, current hospitalization 05/19/2011    Patient Active Problem List   Diagnosis Date Noted  . Threatened miscarriage in early pregnancy 05/15/2018  . Vaginal bleeding affecting early pregnancy 05/15/2018  . Trichimoniasis 05/15/2018  . Acute appendicitis 07/11/2016  . URI (upper respiratory infection) 09/23/2010  . Right otitis media 09/23/2010  . Positive pregnancy test 09/23/2010    Past Surgical History:  Procedure Laterality Date  . APPENDECTOMY    . CESAREAN SECTION    . COLPOSCOPY    . DILATION AND CURETTAGE OF  UTERUS  2010  . LAPAROSCOPIC APPENDECTOMY N/A 07/11/2016   Procedure: APPENDECTOMY LAPAROSCOPIC;  Surgeon: Clayburn Pert, MD;  Location: [facility] ORS;  Service: General;  Laterality: N/A;  . WISDOM TOOTH EXTRACTION      OB History    Gravida  8   Para  6   Term  4   Preterm  2   AB  1   Living  6     SAB  1   TAB  0   Ectopic  0   Multiple  0   Live Births  6            Home Medications    Prior to Admission medications   Medication Sig Start Date End Date Taking? Authorizing Provider  cephALEXin (KEFLEX) 500 MG capsule Take 1 capsule (500 mg total) by mouth 4 (four) times daily for 10 days. 09/06/18 09/16/18  Chai Routh C, PA-C  ibuprofen (ADVIL,MOTRIN) 800 MG tablet Take 1 tablet (800 mg total) by mouth 3 (three) times daily. 04/08/18   Scot Jun, FNP    Family History Family History  Problem Relation Age of Onset  . Hypertension Mother   . Seizures Mother   . Hypertension Maternal Grandmother   . Arthritis Maternal Grandmother   . Cancer Maternal Grandmother  breast  . Stroke Maternal Grandfather   . Seizures Brother   . Mental illness Brother        paranoid schizophrenia  . Birth defects Daughter        hole in heart  . Seizures Maternal Aunt   . Seizures Brother   . Seizures Cousin   . Seizures Cousin   . Mental retardation Cousin     Social History Social History   Tobacco Use  . Smoking status: Current Every Day Smoker    Packs/day: 1.00    Years: 13.00    Pack years: 13.00    Types: Cigarettes  . Smokeless tobacco: Never Used  Substance Use Topics  . Alcohol use: Yes    Comment: episodic  . Drug use: Yes    Frequency: 5.0 times per week    Types: Cocaine    Comment: +coc     Allergies   Levaquin [levofloxacin in d5w]   Review of Systems Review of Systems  Constitutional: Positive for fatigue. Negative for activity change, appetite change, chills and fever.  HENT: Positive for sore throat. Negative for  congestion, ear pain, rhinorrhea, sinus pressure and trouble swallowing.   Eyes: Negative for discharge and redness.  Respiratory: Negative for cough, chest tightness and shortness of breath.   Cardiovascular: Negative for chest pain.  Gastrointestinal: Negative for abdominal pain, diarrhea, nausea and vomiting.  Genitourinary: Positive for dysuria, frequency, urgency and vaginal discharge. Negative for flank pain, genital sores, hematuria, menstrual problem, vaginal bleeding and vaginal pain.  Musculoskeletal: Negative for back pain and myalgias.  Skin: Negative for rash.  Neurological: Negative for dizziness, light-headedness and headaches.     Physical Exam Triage Vital Signs ED Triage Vitals  Enc Vitals Group     BP 09/06/18 1636 116/76     Pulse Rate 09/06/18 1636 90     Resp 09/06/18 1636 16     Temp 09/06/18 1636 99 F (37.2 C)     Temp Source 09/06/18 1636 Temporal     SpO2 09/06/18 1636 98 %     Weight 09/06/18 1629 129 lb (58.5 kg)     Height --      Head Circumference --      Peak Flow --      Pain Score 09/06/18 1629 6     Pain Loc --      Pain Edu? --      Excl. in GC? --    No data found.  Updated Vital Signs BP 116/76 (BP Location: Right Arm)   Pulse 90   Temp 99 F (37.2 C) (Temporal)   Resp 16   Wt 129 lb (58.5 kg)   LMP 07/27/2018   SpO2 98%   Breastfeeding Unknown   BMI 22.14 kg/m   Visual Acuity Right Eye Distance:   Left Eye Distance:   Bilateral Distance:    Right Eye Near:   Left Eye Near:    Bilateral Near:     Physical Exam Vitals signs and nursing note reviewed.  Constitutional:      General: She is not in acute distress.    Appearance: She is well-developed.  HENT:     Head: Normocephalic and atraumatic.     Ears:     Comments: Bilateral ears without tenderness to palpation of external auricle, tragus and mastoid, EAC's without erythema or swelling, TM's with good bony landmarks and cone of light. Non erythematous.     Nose:      Comments:  Nasal mucosa pink, no rhinorrhea    Mouth/Throat:     Comments: Oral mucosa pink and moist, bilateral tonsils enlarged and erythematous with exudate, uvula midline, no soft palate swelling, posterior pharynx patent and nonerythematous, no uvula deviation or swelling. Normal phonation.  Eyes:     Conjunctiva/sclera: Conjunctivae normal.  Neck:     Musculoskeletal: Neck supple.  Cardiovascular:     Rate and Rhythm: Normal rate and regular rhythm.     Heart sounds: No murmur.  Pulmonary:     Effort: Pulmonary effort is normal. No respiratory distress.     Breath sounds: Normal breath sounds.     Comments: Breathing comfortably at rest, CTABL, no wheezing, rales or other adventitious sounds auscultated  Abdominal:     Palpations: Abdomen is soft.     Tenderness: There is no abdominal tenderness.  Skin:    General: Skin is warm and dry.  Neurological:     Mental Status: She is alert.      UC Treatments / Results  Labs (all labs ordered are listed, but only abnormal results are displayed) Labs Reviewed  POCT URINALYSIS DIP (DEVICE) - Abnormal; Notable for the following components:      Result Value   Hgb urine dipstick MODERATE (*)    Protein, ur 30 (*)    Leukocytes,Ua LARGE (*)    All other components within normal limits  POCT RAPID STREP A - Abnormal; Notable for the following components:   Streptococcus, Group A Screen (Direct) POSITIVE (*)    All other components within normal limits  NOVEL CORONAVIRUS, NAA (HOSPITAL ORDER, SEND-OUT TO REF LAB)  URINE CULTURE  POC URINE PREG, ED  POCT PREGNANCY, URINE  CERVICOVAGINAL ANCILLARY ONLY    EKG   Radiology No results found.  Procedures Procedures (including critical care time)  Medications Ordered in UC Medications  cefTRIAXone (ROCEPHIN) injection 250 mg (250 mg Intramuscular Given 09/06/18 1729)  azithromycin (ZITHROMAX) tablet 1,000 mg (1,000 mg Oral Given 09/06/18 1728)  azithromycin (ZITHROMAX)  250 MG tablet (has no administration in time range)  cefTRIAXone (ROCEPHIN) 250 MG injection (has no administration in time range)  lidocaine (PF) (XYLOCAINE) 1 % injection (has no administration in time range)    Initial Impression / Assessment and Plan / UC Course  I have reviewed the triage vital signs and the nursing notes.  Pertinent labs & imaging results that were available during my care of the patient were reviewed by me and considered in my medical decision making (see chart for details).     Pregnancy test negative.  Strep test positive, UA suggestive of UTI.  Will empirically treat for both with Keflex x10 days.  Urine culture sent.  Empirically treating for gonorrhea and chlamydia today as well as sending off vaginal swab to check for STDs.  Will call patient with results and provide further treatment if needed.  Discussed strict return precautions. Patient verbalized understanding and is agreeable with plan.  Final Clinical Impressions(s) / UC Diagnoses   Final diagnoses:  Strep pharyngitis  Lower urinary tract infectious disease  Screen for STD (sexually transmitted disease)     Discharge Instructions     Sore Throat  Your rapid strep test was positive today. We will treat you for strep throat with an antibiotic. Please take keflex for the next 10 days to also cover UTI  Please continue Tylenol or Ibuprofen for fever and pain. May try salt water gargles, cepacol lozenges, throat spray, or OTC cold relief medicine  for throat discomfort. If you also have congestion take a daily anti-histamine like Zyrtec, Claritin, and a oral decongestant to help with post nasal drip that may be irritating your throat.   Stay hydrated and drink plenty of fluids to keep your throat coated relieve irritation.      ED Prescriptions    Medication Sig Dispense Auth. Provider   cephALEXin (KEFLEX) 500 MG capsule Take 1 capsule (500 mg total) by mouth 4 (four) times daily for 10 days. 40  capsule Micheale Schlack C, PA-C   fluconazole (DIFLUCAN) 150 MG tablet Take 1 tablet (150 mg total) by mouth once for 1 dose. 2 tablet Zameer Borman C, PA-C     Controlled Substance Prescriptions [place] Controlled Substance Registry consulted? Not Applicable   Lew DawesWieters, Jannelle Notaro C, [place]-C 09/07/18 [phone]

## 2018-09-08 ENCOUNTER — Telehealth (HOSPITAL_COMMUNITY): Payer: Self-pay | Admitting: Emergency Medicine

## 2018-09-08 ENCOUNTER — Encounter (HOSPITAL_COMMUNITY): Payer: Self-pay

## 2018-09-08 LAB — NOVEL CORONAVIRUS, NAA (HOSP ORDER, SEND-OUT TO REF LAB; TAT 18-24 HRS): SARS-CoV-2, NAA: NOT DETECTED

## 2018-09-08 LAB — CERVICOVAGINAL ANCILLARY ONLY
Bacterial vaginitis: POSITIVE — AB
Candida vaginitis: NEGATIVE
Chlamydia: NEGATIVE
Neisseria Gonorrhea: NEGATIVE
Trichomonas: POSITIVE — AB

## 2018-09-08 MED ORDER — METRONIDAZOLE 500 MG PO TABS: 500.0000 mg | ORAL_TABLET | Freq: Two times a day (BID) | ORAL | 0 refills | Status: DC

## 2018-09-08 NOTE — Telephone Encounter (Signed)
I called in a verbal order for flagyl due to pt not having any address to send electronically.

## 2018-09-08 NOTE — Telephone Encounter (Signed)
Bacterial vaginosis is positive. This was not treated at the urgent care visit.  Flagyl 500 mg BID x 7 days #14 no refills sent to patients pharmacy of choice.    Trichomonas is positive. Rx  for Flagyl was sent to the pharmacy of record. Pt needs education to refrain from sexual intercourse for 7 days to give the medicine time to work. Sexual partners need to be notified and tested/treated. Condoms may reduce risk of reinfection. Recheck for further evaluation if symptoms are not improving.   Attempted to reach patient. No answer at this time. Call cannot be completed.

## 2018-09-09 LAB — URINE CULTURE: Culture: >=100000 — AB

## 2018-09-11 ENCOUNTER — Telehealth (HOSPITAL_COMMUNITY): Payer: Self-pay | Admitting: Emergency Medicine

## 2018-09-11 NOTE — Telephone Encounter (Signed)
Attempted to reach patient x2. No answer at this time. Call cannot be completed.   

## 2018-09-13 ENCOUNTER — Telehealth (HOSPITAL_COMMUNITY): Payer: Self-pay | Admitting: Emergency Medicine

## 2018-09-13 NOTE — Telephone Encounter (Signed)
Attempted to reach patient x3. No answer at this time. Call cannot be completed. Pt has not read Mychart Message. No working number, no address on file. Unable to send letter or message to patient. Closing out chart.

## 2018-12-21 IMAGING — DX DG RIBS W/ CHEST 3+V*R*
3 series · 3 of 3 positions shown · non-contrast
Comparison: Chest 12/02/2015

CLINICAL DATA: Lateral right rib pain. Felt a rib pop while
driving.

EXAM:
RIGHT RIBS AND CHEST - 3+ VIEW

[chest pa]
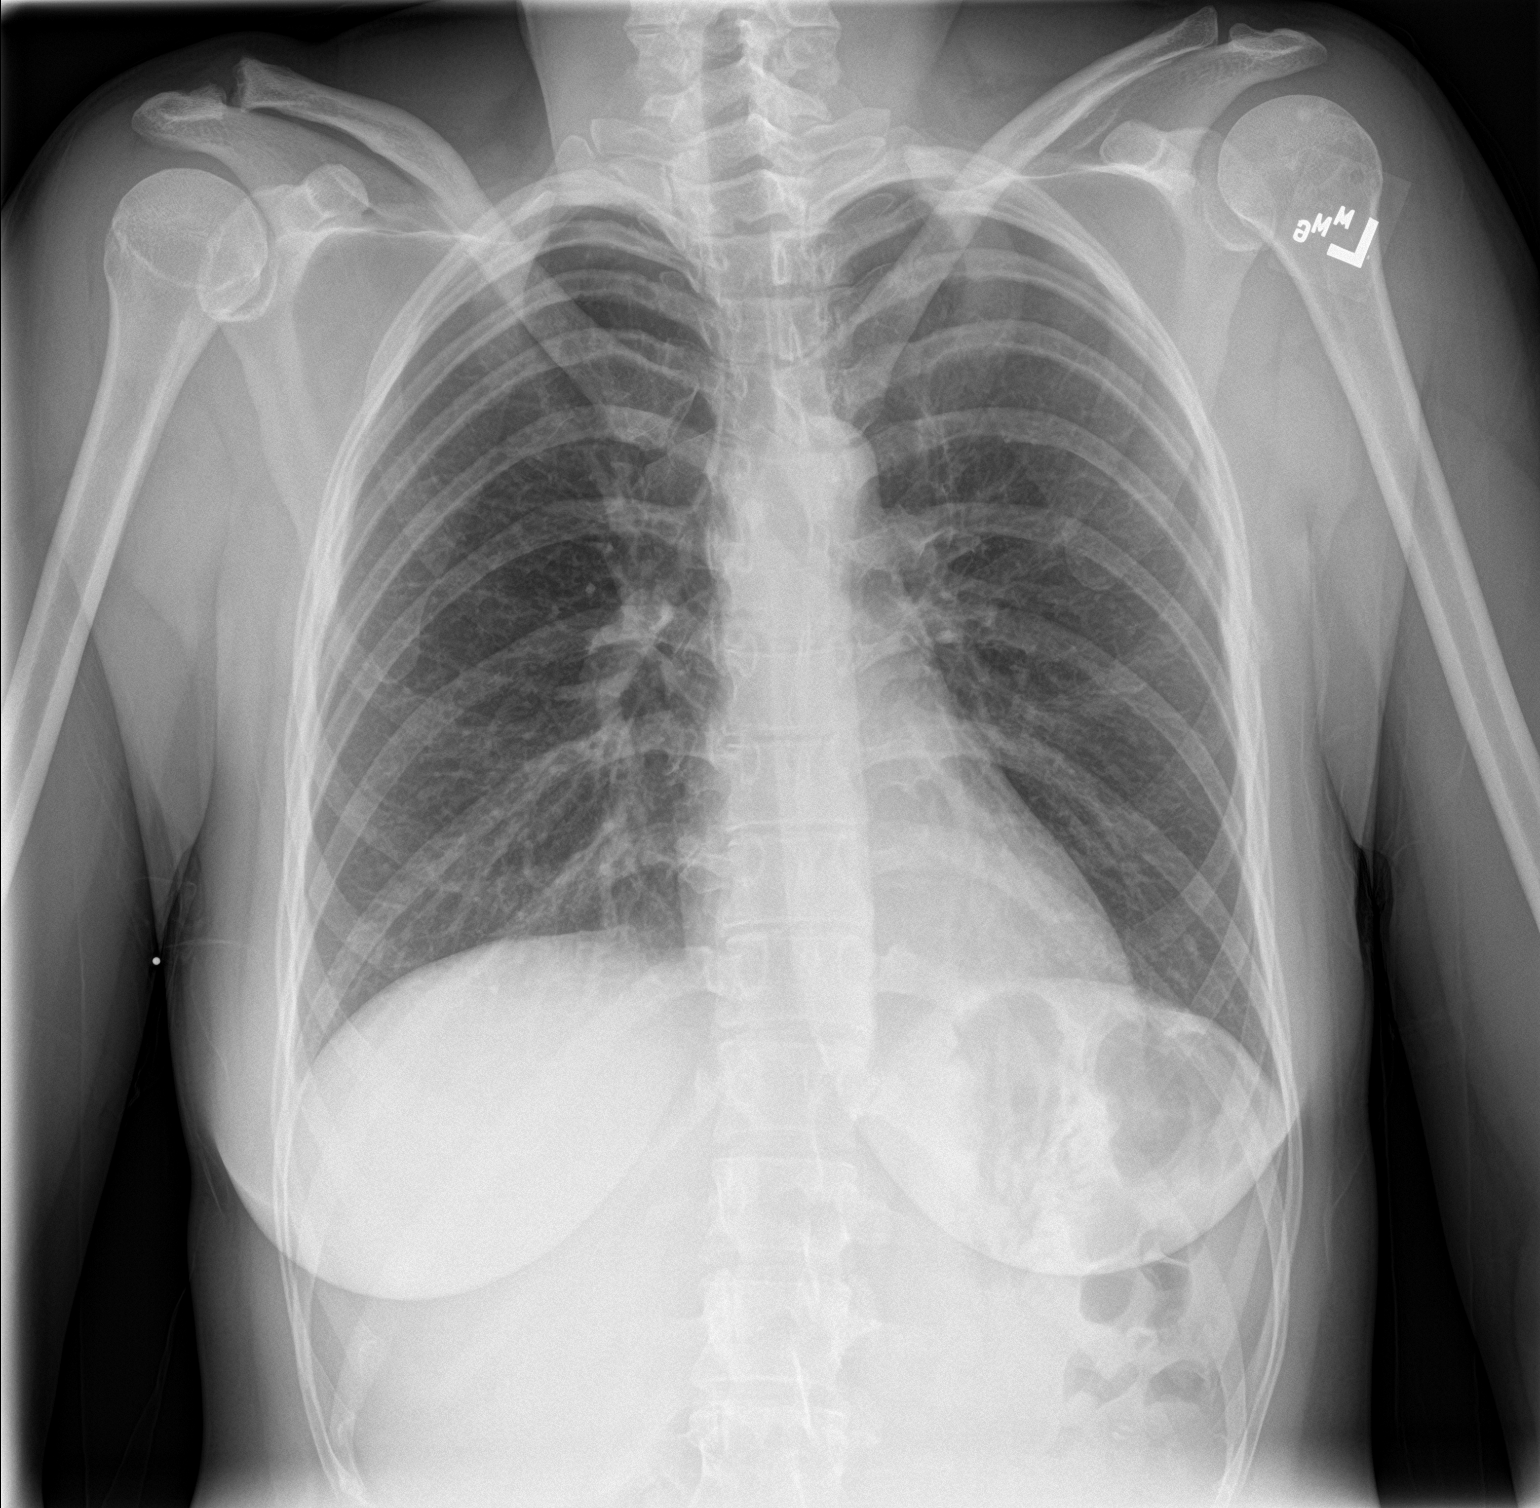

[rib pa]
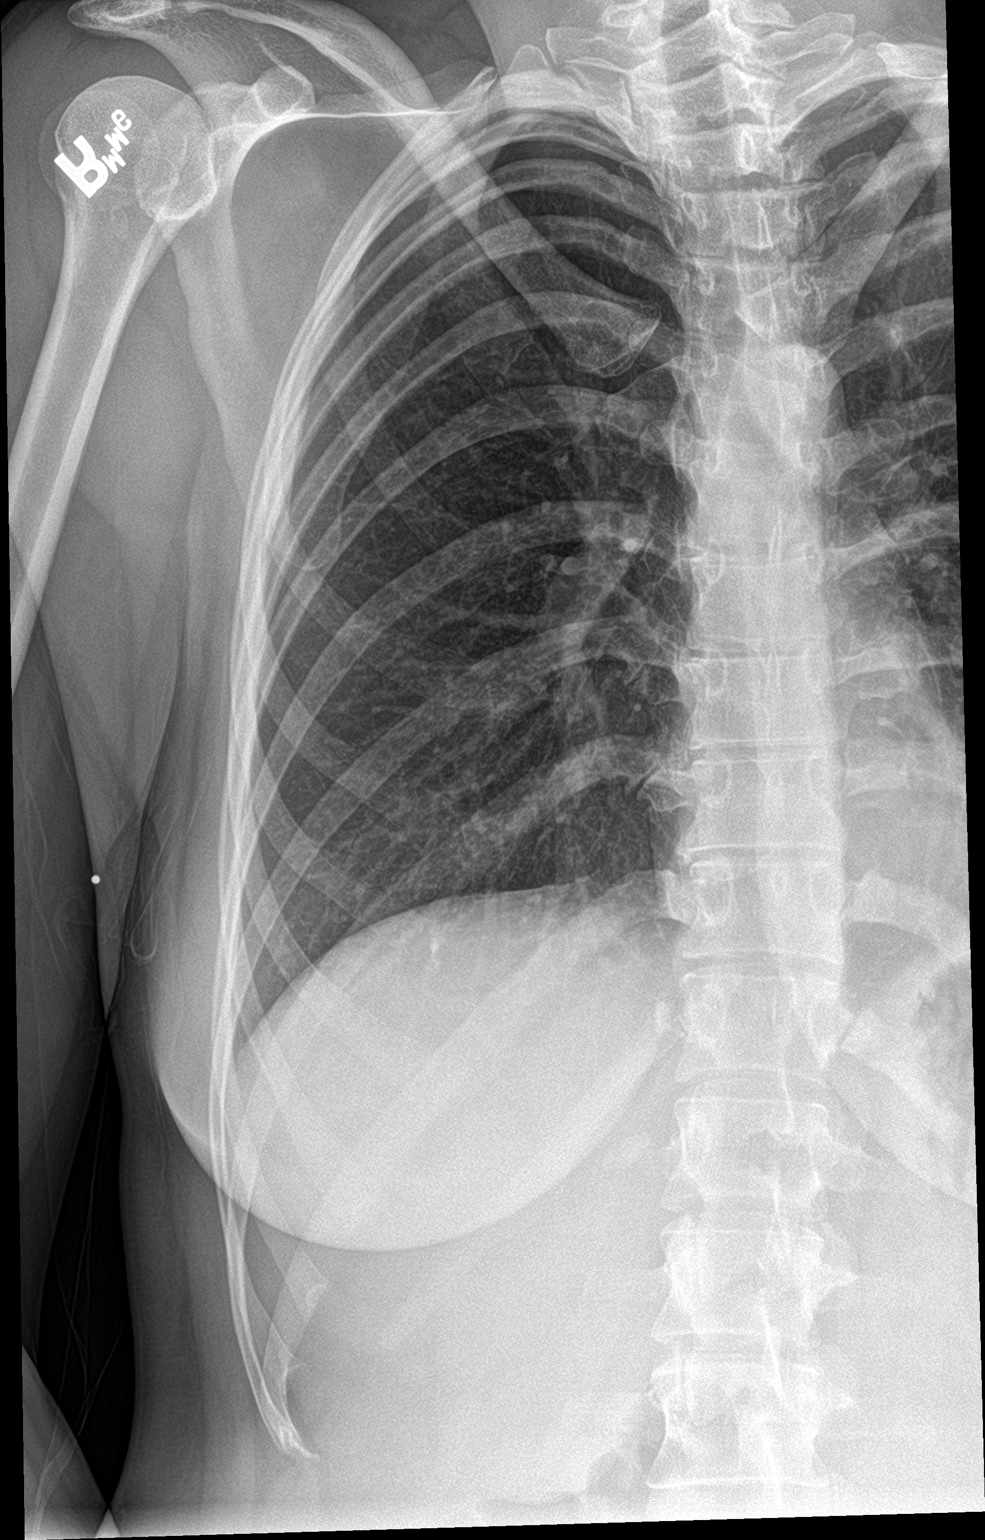

[rib pa obl]
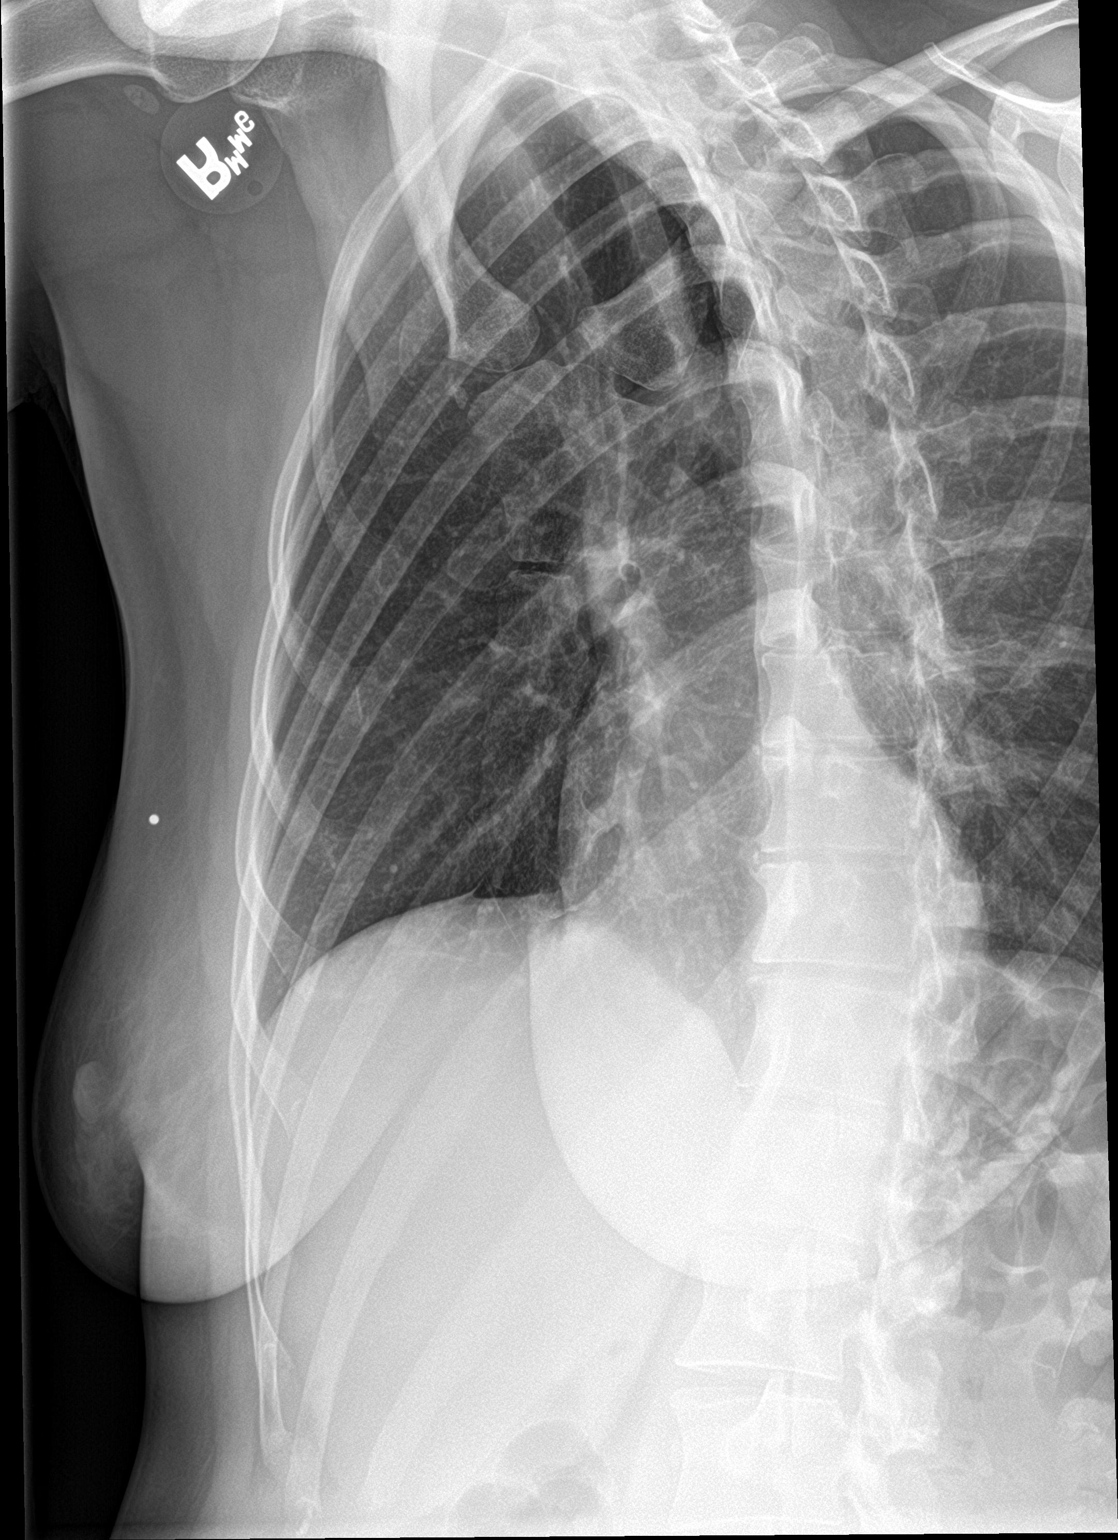

[3 of 3 positions shown; findings below may reference images not displayed]

FINDINGS: Heart size and pulmonary vascularity are normal. Lungs are clear and
expanded. No blunting of costophrenic angles. No pneumothorax.

Right ribs appear intact. No acute displaced fractures identified.
No focal bone lesion or bone destruction. Incidental note of a
healing fracture in the mid lateral left ribs.
IMPRESSION: No evidence of active pulmonary disease. Negative right ribs.
Healing fracture in the left mid lateral ribs.

## 2019-01-26 HISTORY — DX: Maternal care for unspecified type scar from previous cesarean delivery: O34.219

## 2019-01-26 NOTE — L&D Delivery Note (Signed)
Delivery Note At 4:28 PM a viable female was delivered via VBAC, Spontaneous (Presentation: Middle Occiput Anterior).  APGAR: 8, 9; weight  .  The baby presented direct OA, with external rotation to ROT. Hand presentation noted. Easy should delivery. Vigorous newborn with spontaneous respirations noted. Infant placed skin to skin on maternal abdomen. Cord clamped and cut by the grandmother.   Placenta status: Spontaneous, Intact.  Cord: 3 vessels with the following complications: None.  Cord pH: NA  Anesthesia: Epidural Episiotomy: None Lacerations: None Suture Repair: none- intact Est. Blood Loss (mL): 100  Mom to postpartum.  Baby to Couplet care / Skin to Skin.  Both in stable condition.  Mirna Mires 08/18/2019, 5:01 PM

## 2019-03-20 ENCOUNTER — Emergency Department
Admission: EM | Admit: 2019-03-20 | Discharge: 2019-03-20 | Disposition: A | Discharge status: Home / Self Care | Payer: Medicaid Other | Attending: Emergency Medicine | Admitting: Emergency Medicine

## 2019-03-20 ENCOUNTER — Emergency Department: Payer: Medicaid Other

## 2019-03-20 ENCOUNTER — Encounter: Payer: Self-pay | Admitting: Emergency Medicine

## 2019-03-20 ENCOUNTER — Other Ambulatory Visit: Payer: Self-pay

## 2019-03-20 DIAGNOSIS — F17210 Nicotine dependence, cigarettes, uncomplicated: Secondary | ICD-10-CM | POA: Diagnosis not present

## 2019-03-20 DIAGNOSIS — K5900 Constipation, unspecified: Secondary | ICD-10-CM | POA: Diagnosis not present

## 2019-03-20 DIAGNOSIS — O99612 Diseases of the digestive system complicating pregnancy, second trimester: Secondary | ICD-10-CM | POA: Diagnosis not present

## 2019-03-20 DIAGNOSIS — O26892 Other specified pregnancy related conditions, second trimester: Secondary | ICD-10-CM

## 2019-03-20 DIAGNOSIS — R1011 Right upper quadrant pain: Secondary | ICD-10-CM | POA: Insufficient documentation

## 2019-03-20 DIAGNOSIS — R109 Unspecified abdominal pain: Secondary | ICD-10-CM

## 2019-03-20 DIAGNOSIS — Z3A17 17 weeks gestation of pregnancy: Secondary | ICD-10-CM | POA: Insufficient documentation

## 2019-03-20 LAB — URINALYSIS, COMPLETE (UACMP) WITH MICROSCOPIC
Bacteria, UA: NONE SEEN
Bilirubin Urine: NEGATIVE
Glucose, UA: NEGATIVE mg/dL
Hgb urine dipstick: NEGATIVE
Ketones, ur: NEGATIVE mg/dL
Leukocytes,Ua: NEGATIVE
Nitrite: NEGATIVE
Protein, ur: NEGATIVE mg/dL
Specific Gravity, Urine: 1.025 (ref 1.005–1.030)
pH: 5 (ref 5.0–8.0)

## 2019-03-20 LAB — COMPREHENSIVE METABOLIC PANEL
ALT: 18 U/L (ref 0–44)
AST: 13 U/L — ABNORMAL LOW (ref 15–41)
Albumin: 3.1 g/dL — ABNORMAL LOW (ref 3.5–5.0)
Alkaline Phosphatase: 41 U/L (ref 38–126)
Anion gap: 8 (ref 5–15)
BUN: 13 mg/dL (ref 6–20)
CO2: 23 mmol/L (ref 22–32)
Calcium: 8.8 mg/dL — ABNORMAL LOW (ref 8.9–10.3)
Chloride: 107 mmol/L (ref 98–111)
Creatinine, Ser: 0.39 mg/dL — ABNORMAL LOW (ref 0.44–1.00)
GFR calc Af Amer: >60 mL/min (ref >60)
GFR calc non Af Amer: >60 mL/min (ref >60)
Glucose, Bld: 85 mg/dL (ref 70–99)
Potassium: 3.4 mmol/L — ABNORMAL LOW (ref 3.5–5.1)
Sodium: 138 mmol/L (ref 135–145)
Total Bilirubin: 0.5 mg/dL (ref 0.3–1.2)
Total Protein: 5.8 g/dL — ABNORMAL LOW (ref 6.5–8.1)

## 2019-03-20 LAB — CBC
HCT: 32.8 % — ABNORMAL LOW (ref 36.0–46.0)
Hemoglobin: 11 g/dL — ABNORMAL LOW (ref 12.0–15.0)
MCH: 33 pg (ref 26.0–34.0)
MCHC: 33.5 g/dL (ref 30.0–36.0)
MCV: 98.5 fL (ref 80.0–100.0)
Platelets: 262 10*3/uL (ref 150–400)
RBC: 3.33 MIL/uL — ABNORMAL LOW (ref 3.87–5.11)
RDW: 13.2 % (ref 11.5–15.5)
WBC: 9.8 10*3/uL (ref 4.0–10.5)
nRBC: 0 % (ref 0.0–0.2)

## 2019-03-20 LAB — LIPASE, BLOOD: Lipase: 26 U/L (ref 11–51)

## 2019-03-20 LAB — HCG, QUANTITATIVE, PREGNANCY: hCG, Beta Chain, Quant, S: 16210 m[IU]/mL — ABNORMAL HIGH (ref <5)

## 2019-03-20 LAB — POCT PREGNANCY, URINE: Preg Test, Ur: POSITIVE — AB

## 2019-03-20 MED ORDER — PRENATAL VITAMIN 27-0.8 MG PO TABS: 1.0000 | ORAL_TABLET | Freq: Every day | ORAL | 1 refills | Status: DC

## 2019-03-20 MED ORDER — FLEET ENEMA 7-19 GM/118ML RE ENEM: 1.0000 | ENEMA | Freq: Once | RECTAL | 0 refills | Status: AC

## 2019-03-20 MED ORDER — GLYCERIN (ADULT) 2 G RE SUPP: 1.0000 | RECTAL | 0 refills | Status: DC | PRN

## 2019-03-20 NOTE — ED Triage Notes (Signed)
Pt in via POV, reports ongoing abdominal pain and constipation x 2 months, reports bloat, nausea with eating.  Ambulatory to triage.  NAD noted at this time.

## 2019-03-20 NOTE — ED Notes (Signed)
Pt given cup of water per request.

## 2019-03-20 NOTE — ED Notes (Signed)
Pt ambulated to restroom with steady gait. [place] completed.

## 2019-03-20 NOTE — ED Provider Notes (Signed)
[facility] Emergency Department Provider Note ____________________________________________   First MD Initiated Contact with Patient 03/20/19 1344     (approximate)  I have reviewed the triage vital signs and the nursing notes.   HISTORY  Chief Complaint Abdominal Pain    HPI Erica [name] is a 39 y.o. female with PMH as noted below who is a G 10 P6 with unknown dates presents with abdominal pain intermittently over the last week.  She reports pain in the lower abdomen and suprapubic area but also in the right upper and mid abdomen.  It is worse after she eats and then resolves.  It is associated with some nausea and decreased appetite, but no vomiting.  The patient also reports being chronically constipated and recently took lactulose and MiraLAX without relief.  She has no dysuria, vaginal bleeding or discharge, or fever.  She did not know she was pregnant, and she reports her LMP in December.  Past Medical History:  Diagnosis Date  . Abnormal Pap smear   . Anxiety    as teen  . Anxiety   . Depression    as teen  . GERD (gastroesophageal reflux disease)   . Headache(784.0)   . No pertinent past medical history   . VBAC, delivered, current hospitalization 05/19/2011    Patient Active Problem List   Diagnosis Date Noted  . Threatened miscarriage in early pregnancy 05/15/2018  . Vaginal bleeding affecting early pregnancy 05/15/2018  . Trichimoniasis 05/15/2018  . Acute appendicitis 07/11/2016  . URI (upper respiratory infection) 09/23/2010  . Right otitis media 09/23/2010  . Positive pregnancy test 09/23/2010    Past Surgical History:  Procedure Laterality Date  . APPENDECTOMY    . CESAREAN SECTION    . COLPOSCOPY    . DILATION AND CURETTAGE OF UTERUS  2010  . LAPAROSCOPIC APPENDECTOMY N/A 07/11/2016   Procedure: APPENDECTOMY LAPAROSCOPIC;  Surgeon: Ricarda Frame, MD;  Location: [facility] ORS;  Service: General;  Laterality: N/A;  . WISDOM  TOOTH EXTRACTION      Prior to Admission medications   Medication Sig Start Date End Date Taking? Authorizing Provider  glycerin adult 2 g suppository Place 1 suppository rectally as needed for constipation. 03/20/19   Dionne Bucy, MD  ibuprofen (ADVIL,MOTRIN) 800 MG tablet Take 1 tablet (800 mg total) by mouth 3 (three) times daily. 04/08/18   Bing Neighbors, FNP  Prenatal Vit-Fe Fumarate-FA (PRENATAL VITAMIN) 27-0.8 MG TABS Take 1 tablet by mouth daily. 03/20/19   Dionne Bucy, MD  sodium phosphate (FLEET) 7-19 GM/118ML ENEM Place 133 mLs (1 enema total) rectally once for 1 dose. 03/20/19 03/20/19  Dionne Bucy, MD    Allergies Levaquin [levofloxacin in d5w]  Family History  Problem Relation Age of Onset  . Hypertension Mother   . Seizures Mother   . Hypertension Maternal Grandmother   . Arthritis Maternal Grandmother   . Cancer Maternal Grandmother        breast  . Stroke Maternal Grandfather   . Seizures Brother   . Mental illness Brother        paranoid schizophrenia  . Birth defects Daughter        hole in heart  . Seizures Maternal Aunt   . Seizures Brother   . Seizures Cousin   . Seizures Cousin   . Mental retardation Cousin     Social History Social History   Tobacco Use  . Smoking status: Current Every Day Smoker    Packs/day: 0.50  Years: 13.00    Pack years: 6.50    Types: Cigarettes  . Smokeless tobacco: Never Used  Substance Use Topics  . Alcohol use: Yes  . Drug use: Not Currently    Types: Cocaine    Review of Systems  Constitutional: No fever. Eyes: No redness. ENT: No sore throat. Cardiovascular: Denies chest pain. Respiratory: Denies shortness of breath. Gastrointestinal: Positive for nausea and constipation. Genitourinary: Negative for dysuria.  Musculoskeletal: Negative for back pain. Skin: Negative for rash. Neurological: Negative for headache.   ____________________________________________   PHYSICAL  EXAM:  VITAL SIGNS: ED Triage Vitals  Enc Vitals Group     BP 03/20/19 1308 127/69     Pulse Rate 03/20/19 1308 92     Resp 03/20/19 1308 17     Temp 03/20/19 1308 98.5 F (36.9 C)     Temp Source 03/20/19 1308 Oral     SpO2 03/20/19 1308 100 %     Weight 03/20/19 1309 148 lb (67.1 kg)     Height 03/20/19 1309 5\' 5"  (1.651 m)     Head Circumference --      Peak Flow --      Pain Score 03/20/19 1309 9     Pain Loc --      Pain Edu? --      Excl. in [place]? --     Constitutional: Alert and oriented. Well appearing and in no acute distress. Eyes: Conjunctivae are normal.  No scleral icterus. Head: Atraumatic. Nose: No congestion/rhinnorhea. Mouth/Throat: Mucous membranes are moist.   Neck: Normal range of motion.  Cardiovascular: Good peripheral circulation. Respiratory: Normal respiratory effort.  No retractions.  Gastrointestinal: Soft with mild right upper quadrant and suprapubic tenderness.  No distention.  Genitourinary: No flank tenderness. Musculoskeletal:  Extremities warm and well perfused.  Neurologic:  Normal speech and language. No gross focal neurologic deficits are appreciated.  Skin:  Skin is warm and dry. No rash noted. Psychiatric: Mood and affect are normal. Speech and behavior are normal.  ____________________________________________   LABS (all labs ordered are listed, but only abnormal results are displayed)  Labs Reviewed  COMPREHENSIVE METABOLIC PANEL - Abnormal; Notable for the following components:      Result Value   Potassium 3.4 (*)    Creatinine, Ser 0.39 (*)    Calcium 8.8 (*)    Total Protein 5.8 (*)    Albumin 3.1 (*)    AST 13 (*)    All other components within normal limits  CBC - Abnormal; Notable for the following components:   RBC 3.33 (*)    Hemoglobin 11.0 (*)    HCT 32.8 (*)    All other components within normal limits  URINALYSIS, COMPLETE (UACMP) WITH MICROSCOPIC - Abnormal; Notable for the following components:   Color,  Urine YELLOW (*)    APPearance HAZY (*)    All other components within normal limits  HCG, QUANTITATIVE, PREGNANCY - Abnormal; Notable for the following components:   hCG, Beta Chain, Quant, S 16,210 (*)    All other components within normal limits  POCT PREGNANCY, URINE - Abnormal; Notable for the following components:   Preg Test, Ur POSITIVE (*)    All other components within normal limits  LIPASE, BLOOD  POC URINE PREG, ED   ____________________________________________  EKG   ____________________________________________  RADIOLOGY  US pelvis: Live IUP with FHR, 17 weeks [place] RUQ: No gallstones or evidence of cholecystitis  ____________________________________________   PROCEDURES  Procedure(s) performed: No  Procedures  Critical Care performed: No ____________________________________________   INITIAL IMPRESSION / ASSESSMENT AND PLAN / ED COURSE  Pertinent labs & imaging results that were available during my care of the patient were reviewed by me and considered in my medical decision making (see chart for details).  39 year old female G6P6 with unknown dates but LMP likely in December presents with both lower and right upper abdominal pain over the last week, mainly after eating and associated nausea.  She also reports significant constipation over the last few months.  She did not know she was pregnant.  On exam, she is overall very well-appearing.  Her vital signs are normal.  The abdomen is soft with mild suprapubic and right upper quadrant tenderness.  Overall I suspect most likely the symptoms are related to pregnancy and uterine/round ligament pain, with likely component of chronic constipation.  Differential also includes cholelithiasis or less likely cholecystitis.  The patient reports that she is status post appendectomy.  Initial lab work-up obtained from triage is unremarkable.  Her WBC count is normal.  Urinalysis shows no significant findings.  We will  obtain a serum hCG, pelvic OB ultrasound to confirm IUP and establish dates and a right upper quadrant ultrasound to evaluate for cholelithiasis.  ----------------------------------------- 4:59 PM on 03/20/2019 -----------------------------------------  Serum hCG is 16,000.  The ultrasound demonstrates a live IUP at 17 weeks.  The right upper quadrant ultrasound shows no acute abnormalities.  On reassessment, the patient continues to be her comfortable.  She has no significant pain at this time and has not vomited.  She is stable for discharge home.  I recommended that she continue MiraLAX but no longer take the lactulose.  I will also prescribe glycerin suppository and Fleet enema to try.  I gave the patient very thorough return precautions and she expressed understanding.   ____________________________________________   FINAL CLINICAL IMPRESSION(S) / ED DIAGNOSES  Final diagnoses:  Right upper quadrant abdominal pain  Abdominal pain during pregnancy in second trimester  Constipation, unspecified constipation type      NEW MEDICATIONS STARTED DURING THIS VISIT:  New Prescriptions   GLYCERIN ADULT 2 G SUPPOSITORY    Place 1 suppository rectally as needed for constipation.   PRENATAL VIT-FE FUMARATE-FA (PRENATAL VITAMIN) 27-0.8 MG TABS    Take 1 tablet by mouth daily.   SODIUM PHOSPHATE (FLEET) 7-19 GM/118ML ENEM    Place 133 mLs (1 enema total) rectally once for 1 dose.     Note:  This document was prepared using Dragon voice recognition software and may include unintentional dictation errors.    Dionne Bucy, MD 03/20/19 [phone]

## 2019-03-20 NOTE — Discharge Instructions (Addendum)
Continue to use the MiraLAX for at least the next several days.  You can try either the fleets enema, or a glycerin suppository to help with the constipation as well.  You should schedule a follow-up with an OB/GYN, either at the [facility], the health department, or another OB/GYN of your choice.  Return to the ER for new, worsening, or persistent severe pain, vomiting, fever, weakness, vaginal bleeding, or any other new or worsening symptoms that concern you.

## 2019-03-20 NOTE — ED Notes (Signed)
Pt ambulatory to restroom. Given urine specimen cup.

## 2019-03-20 NOTE — ED Notes (Addendum)
LMP was late in December.

## 2019-03-20 NOTE — ED Notes (Signed)
Patient is ambulatory, restless in the room.

## 2019-03-20 NOTE — ED Notes (Signed)
US in progress

## 2019-03-23 ENCOUNTER — Ambulatory Visit: Payer: Medicaid Other | Admitting: Family Medicine

## 2019-03-23 ENCOUNTER — Encounter: Payer: Self-pay | Admitting: Family Medicine

## 2019-03-23 ENCOUNTER — Other Ambulatory Visit: Payer: Self-pay

## 2019-03-23 VITALS — BP 114/58 | HR 81 | Temp 99.0°F | Wt 150.8 lb

## 2019-03-23 DIAGNOSIS — O99330 Smoking (tobacco) complicating pregnancy, unspecified trimester: Secondary | ICD-10-CM

## 2019-03-23 DIAGNOSIS — O09529 Supervision of elderly multigravida, unspecified trimester: Secondary | ICD-10-CM | POA: Insufficient documentation

## 2019-03-23 DIAGNOSIS — O09522 Supervision of elderly multigravida, second trimester: Secondary | ICD-10-CM

## 2019-03-23 DIAGNOSIS — Z8759 Personal history of other complications of pregnancy, childbirth and the puerperium: Secondary | ICD-10-CM

## 2019-03-23 DIAGNOSIS — Z98891 History of uterine scar from previous surgery: Secondary | ICD-10-CM

## 2019-03-23 DIAGNOSIS — Z641 Problems related to multiparity: Secondary | ICD-10-CM

## 2019-03-23 DIAGNOSIS — O0990 Supervision of high risk pregnancy, unspecified, unspecified trimester: Secondary | ICD-10-CM

## 2019-03-23 DIAGNOSIS — F1911 Other psychoactive substance abuse, in remission: Secondary | ICD-10-CM | POA: Insufficient documentation

## 2019-03-23 HISTORY — DX: Smoking (tobacco) complicating pregnancy, unspecified trimester: O99.330

## 2019-03-23 LAB — URINALYSIS
Bilirubin, UA: NEGATIVE
Glucose, UA: NEGATIVE
Ketones, UA: NEGATIVE
Leukocytes,UA: NEGATIVE
Nitrite, UA: NEGATIVE
Protein,UA: NEGATIVE
RBC, UA: NEGATIVE
Specific Gravity, UA: 1.02 (ref 1.005–1.030)
Urobilinogen, Ur: 0.2 mg/dL (ref 0.2–1.0)
pH, UA: 7.5 (ref 5.0–7.5)

## 2019-03-23 LAB — HEMOGLOBIN, FINGERSTICK: Hemoglobin: 12.5 g/dL (ref 11.1–15.9)

## 2019-03-23 LAB — OB RESULTS CONSOLE VARICELLA ZOSTER ANTIBODY, IGG: Varicella: IMMUNE

## 2019-03-23 LAB — WET PREP FOR TRICH, YEAST, CLUE
Trichomonas Exam: NEGATIVE
Yeast Exam: NEGATIVE

## 2019-03-23 LAB — HIV ANTIBODY (ROUTINE TESTING W REFLEX): HIV 1&2 Ab, 4th Generation: NONREACTIVE

## 2019-03-23 NOTE — Progress Notes (Addendum)
Patient here for new OB at about 17 3/7. States she had very light period in 12/2018. [facility] ED visit 03/20/2019, had U/S. States she was incarcerated from 12/07/2018-01/25/2019. Had ectopic pregnancy in 2020. States she had abdominal x-ray in jail due to previous ectopic pregnancy. History of 1 c-section, pre-eclampsia per patient. Currently living with mother.Burt Knack, RN

## 2019-03-23 NOTE — Progress Notes (Signed)
[place] [id number] [phone]  INITIAL PRENATAL VISIT NOTE  Subjective:  Erica [name] is a 39 y.o. [id number] at [redacted]w[redacted]d being seen today to start prenatal care at the [facility].  She is currently monitored for the following issues for this high-risk pregnancy and has History of vaginal birth after cesarean; [place] multipara; Supervision of high risk pregnancy, antepartum; Tobacco use in pregnancy, childbirth, or the puerperium, antepartum; History of pre-eclampsia; AMA (advanced maternal age) multigravida 35+; and History of maternal substance abuse affecting newborn on their problem list.   Pt presents for initial OB exam. She states she is feeling ok about this pregnancy, trying to wrap her mind around it since recent dx at ER 3 days ago. She is living with her mother and works at [employer], 3rd shift. The FOB may be involved in this pregnancy.  Physically she is feeling ok. She had some nausea, resolving now.   She believes she conceived before entering prison in Nov, 2020. At that time she endorses drinking alcohol, using cocaine, marijuana and "occasional opioids." She stopped all substances (aside from tobacco) on Nov 12. She is currently in a drug rehab/therapy program called [facility] which she likes. While in prison she had an abdominal Xray done to follow up on an ectopic pregnancy, though she is unsure of this date. She currently smokes 5-8 cpd.   She denies chronic medical issues, takes no medications daily. She is unsure of her last pap.   She has a hx of preeclampsia which lead to C/S at 35wks in pregnancy #5, birth weight 4 lbs, 2oz. She states she has had some feet and hand swelling and the abd pain for which she was seen in ER, though none present today. No HA, vision disturbances.      Contractions: Not present. Vag. Bleeding: None.  Movement:  Absent. Denies leaking of fluid.   Indications for ASA therapy (per uptodate) One of the following: Previous pregnancy with preeclampsia, especially early onset and with an adverse outcome Yes Multifetal gestation No Chronic hypertension No Type 1 or 2 diabetes mellitus No Chronic kidney disease No Autoimmune disease (antiphospholipid syndrome, systemic lupus erythematosus) No  Two or more of the following: Nulliparity No Obesity (body mass index >30 kg/m2) No Family history of preeclampsia in mother or sister No Age ?1 years Yes Sociodemographic characteristics (African American race, low socioeconomic level) Yes Personal risk factors (eg, previous pregnancy with low birth weight or small for gestational age infant, previous adverse pregnancy outcome [eg, stillbirth], interval >10 years between pregnancies) Yes   The following portions of the patient's history were reviewed and updated as appropriate: allergies, current medications, past family history, past medical history, past social history, past surgical history and problem list. Problem list updated.  Objective:   Vitals:   03/23/19 1347  BP: (!) 114/58  Pulse: 81  Temp: 99 F (37.2 C)  Weight: 150 lb 12.8 oz (68.4 kg)    Fetal Status: Fetal Heart Rate (bpm): 150 Fundal Height: 17 cm Movement: Absent  Presentation: Undeterminable   Physical Exam Vitals and nursing note reviewed.  Constitutional:      General: She is not in acute distress.    Appearance: Normal appearance. She is well-developed.  HENT:     Head: Normocephalic and atraumatic.     Right Ear: External ear normal.     Left Ear: External ear normal.  Nose: Nose normal. No congestion or rhinorrhea.     Mouth/Throat:     Lips: Pink.     Mouth: Mucous membranes are moist.     Dentition: Abnormal dentition (poor dentition). No dental caries.     Pharynx: Oropharynx is clear. Uvula midline.  Eyes:     General: No scleral icterus.     Conjunctiva/sclera: Conjunctivae normal.  Neck:     Thyroid: No thyroid mass or thyromegaly.  Cardiovascular:     Rate and Rhythm: Normal rate.     Pulses: Normal pulses.     Comments: Extremities are warm and well perfused Pulmonary:     Effort: Pulmonary effort is normal.     Breath sounds: Normal breath sounds.  Chest:     Breasts: Breasts are symmetrical.        Right: Normal. No mass, nipple discharge or skin change.        Left: Normal. No mass, nipple discharge or skin change.  Abdominal:     General: Abdomen is flat.     Palpations: Abdomen is soft.     Tenderness: There is no abdominal tenderness.     Comments: Gravid   Genitourinary:    General: Normal vulva.     Exam position: Lithotomy position.     Pubic Area: No rash.      Labia:        Right: No rash.        Left: No rash.      Vagina: Vaginal discharge (white, minimal) present.     Cervix: No cervical motion tenderness or friability.     Uterus: Normal. Enlarged (Gravid 17 wk size). Not tender.      Adnexa: Right adnexa normal and left adnexa normal.     Rectum: Normal. No external hemorrhoid.  Musculoskeletal:     Right lower leg: No edema.     Left lower leg: No edema.  Lymphadenopathy:     Upper Body:     Right upper body: No axillary adenopathy.     Left upper body: No axillary adenopathy.  Skin:    General: Skin is warm.     Capillary Refill: Capillary refill takes less than 2 seconds.  Neurological:     Mental Status: She is alert.     Assessment and Plan:  Pregnancy: [id number] at [redacted]w[redacted]d   1. Supervision of high risk pregnancy, antepartum -Initial prenatal visit today. -Pt elects quad genetic screen and genetic counseling referral. Placed referral to [facility] today.  -Anatomy referral through [facility] for detailed [place] placed today.  -Pap done today, pt is unsure of pap hx.  - Chlamydia/GC NAA, Confirmation - HIV [place] [facility] - Urine Culture - RPR - QUAD Screen [facility] Only - Lead, blood (adult  age 45 yrs or greater) - HGB FRAC. W/SOLUBILITY - CBC/D/Plt+RPR+Rh+ABO+Rub Ab... - IGP, Aptima HPV - Hemoglobin, venipuncture - Urinalysis (Urine Dip) - WET PREP FOR TRICH, YEAST, CLUE  2. Tobacco use in pregnancy, childbirth, or the puerperium, antepartum Patient currently smoking 5-8 cpd and prior to pregnancy was smoking 1/2 ppd. She reports she has found success in reducing her tobacco use by being pregnant = motivation. Counseled using 5 A's method per public health agreement addenda.  Provided 3-10 minutes of tobacco cessation counseling using motivational counseling   3. History of maternal substance abuse affecting newborn -She states she has been clean of cocaine, marijuana, alcohol and opioids since Nov 12. She agrees to qtrimester UDS. She is  in a drug rehab/therapy program called [facility] and declines referral to [facility] or [facility]. Encouraged continued cessation of all substances and to let us know at any time if she would like a referral for additional therapy/support.  -Offered narcan as she has hx of "occasional use" of opioids but she declines and states this was not her drug of choice, she would not use opioids or need narcan if she had a relapse. - [id number] Drug Screen  4. History of pre-eclampsia -Urine WNL. Labs today as below.  -Pt accepts aspirin daily. Handout given.  -S/sx of preeclampsia discussed, advised to let us know/to ER asap if present.  - PIH Panel ([facility] [id number]) - Protein / creatinine ratio, urine  (Spot)  5. History of vaginal birth after cesarean Pt desires TOLAC.   6. Multigravida of advanced maternal age in second trimester -Counseled about genetic screenings and recommended genetic counselor, which pt accepts. -Referral placed today for detailed [place] at 18-20 wks.  7. [place] multipara    Discussed overview of care and coordination with inpatient delivery practices including [facility], [facility], [facility].    Preterm  labor symptoms and general obstetric precautions including but not limited to vaginal bleeding, contractions, leaking of fluid and fetal movement were reviewed in detail with the patient.  Please refer to After Visit Summary for other counseling recommendations.   Return in about 4 weeks (around 04/20/2019) for routine prenatal care.  Future Appointments  Date Time Provider Department Center  04/20/2019  2:00 PM [facility] PROVIDER [facility] None    Ann Held, PA-C

## 2019-03-23 NOTE — Progress Notes (Signed)
Wet mount reviewed, no treatment indicated. Patient declined flu vaccine today. Patient desires BTL and will need delivery plans appointment with [facility] at ~32 weeks due to past C-section. [facility] and GC counseling referral faxed, appointment pending. Patient phone number verified and patient told to expect call with appointment day/time. Patient sent to finance with Medicaid questions today.Burt Knack, RN

## 2019-03-24 LAB — AST+BUN+CREAT+LD+URIC A+HGB...
AST: 24 IU/L (ref 0–40)
BUN: 8 mg/dL (ref 6–20)
Creatinine, Ser: 0.51 mg/dL — ABNORMAL LOW (ref 0.57–1.00)
GFR calc Af Amer: 142 mL/min/{1.73_m2} (ref >59)
GFR calc non Af Amer: 123 mL/min/{1.73_m2} (ref >59)
Hematocrit: 35.9 % (ref 34.0–46.6)
Hemoglobin: 12.4 g/dL (ref 11.1–15.9)
LDH: 162 IU/L (ref 119–226)
Platelets: 303 10*3/uL (ref 150–450)
Uric Acid: 2.4 mg/dL — ABNORMAL LOW (ref 2.6–6.2)

## 2019-03-24 LAB — 789231 7+OXYCODONE-BUND
Amphetamines, Urine: NEGATIVE ng/mL
BENZODIAZ UR QL: NEGATIVE ng/mL
Barbiturate screen, urine: NEGATIVE ng/mL
Cannabinoid Quant, Ur: NEGATIVE ng/mL
Cocaine (Metab.): NEGATIVE ng/mL
OPIATE SCREEN URINE: NEGATIVE ng/mL
Oxycodone/Oxymorphone, Urine: NEGATIVE ng/mL
PCP Quant, Ur: NEGATIVE ng/mL

## 2019-03-24 LAB — PROTEIN / CREATININE RATIO, URINE
Creatinine, Urine: 101.5 mg/dL
Protein, Ur: 7.3 mg/dL
Protein/Creat Ratio: 72 mg/g creat (ref 0–200)

## 2019-03-25 LAB — URINE CULTURE: Organism ID, Bacteria: NO GROWTH

## 2019-03-25 LAB — CHLAMYDIA/GC NAA, CONFIRMATION
Chlamydia trachomatis, NAA: NEGATIVE
Neisseria gonorrhoeae, NAA: NEGATIVE

## 2019-03-26 ENCOUNTER — Other Ambulatory Visit: Payer: Self-pay | Admitting: Family Medicine

## 2019-03-26 DIAGNOSIS — Z3689 Encounter for other specified antenatal screening: Secondary | ICD-10-CM

## 2019-03-26 LAB — CBC/D/PLT+RPR+RH+ABO+RUB AB...
Antibody Screen: NEGATIVE
Basophils Absolute: 0.1 10*3/uL (ref 0.0–0.2)
Basos: 1 %
EOS (ABSOLUTE): 0.2 10*3/uL (ref 0.0–0.4)
Eos: 3 %
Hematocrit: 33.6 % — ABNORMAL LOW (ref 34.0–46.6)
Hemoglobin: 11.8 g/dL (ref 11.1–15.9)
Hepatitis B Surface Ag: NEGATIVE
Immature Grans (Abs): 0.1 10*3/uL (ref 0.0–0.1)
Immature Granulocytes: 1 %
Lymphocytes Absolute: 2.8 10*3/uL (ref 0.7–3.1)
Lymphs: 31 %
MCH: 33.2 pg — ABNORMAL HIGH (ref 26.6–33.0)
MCHC: 35.1 g/dL (ref 31.5–35.7)
MCV: 95 fL (ref 79–97)
Monocytes Absolute: 0.8 10*3/uL (ref 0.1–0.9)
Monocytes: 9 %
Neutrophils Absolute: 4.9 10*3/uL (ref 1.4–7.0)
Neutrophils: 55 %
Platelets: 298 10*3/uL (ref 150–450)
RBC: 3.55 x10E6/uL — ABNORMAL LOW (ref 3.77–5.28)
RDW: 12.4 % (ref 11.7–15.4)
RPR Ser Ql: NONREACTIVE
Rh Factor: POSITIVE
Rubella Antibodies, IGG: 1.1 index (ref >0.99)
Varicella zoster IgG: 858 index (ref >165)
WBC: 8.8 10*3/uL (ref 3.4–10.8)

## 2019-03-26 LAB — HGB FRAC. W/SOLUBILITY
Hgb A2 Quant: 2.4 % (ref 1.8–3.2)
Hgb A: 96.2 % — ABNORMAL LOW (ref 96.4–98.8)
Hgb C: 0 %
Hgb F Quant: 1.4 % (ref 0.0–2.0)
Hgb S: 0 %
Hgb Solubility: NEGATIVE
Hgb Variant: 0 %

## 2019-03-26 LAB — LEAD, BLOOD (ADULT >= 16 YRS): Lead-Whole Blood: <1 ug/dL (ref 0–4)

## 2019-03-27 ENCOUNTER — Telehealth: Payer: Self-pay | Admitting: General Practice

## 2019-03-27 LAB — IGP, APTIMA HPV
HPV Aptima: NEGATIVE
PAP Smear Comment: 0

## 2019-03-27 NOTE — Telephone Encounter (Signed)
calling to see if results came in from past visit

## 2019-03-27 NOTE — Telephone Encounter (Signed)
Patient called x 2 this morning regarding test results she can see through MyChart. Patient specifically asking about "+Rh factor." RN counseled patient that +Rh factor is normal. Patient also requesting HIV and Quad Screen results which are not back yet. RN again counseled if any test results return positive, patient will be notified. Patient verbalized understanding of above stating "yeah I understand that but to a patient we don't know what all of this means." Call then was disconnected from patient's end.  Tawny Hopping, RN

## 2019-04-02 ENCOUNTER — Ambulatory Visit: Admission: RE | Admit: 2019-04-02 | Payer: Medicaid Other | Source: Ambulatory Visit

## 2019-04-02 ENCOUNTER — Ambulatory Visit: Payer: Medicaid Other

## 2019-04-04 NOTE — Progress Notes (Signed)
HIV results abstracted. Joscelin Fray, RN  

## 2019-04-05 ENCOUNTER — Ambulatory Visit
Admission: RE | Admit: 2019-04-05 | Discharge: 2019-04-05 | Disposition: A | Discharge status: Home / Self Care | Payer: Medicaid Other | Source: Ambulatory Visit | Attending: Obstetrics and Gynecology | Admitting: Obstetrics and Gynecology

## 2019-04-05 ENCOUNTER — Other Ambulatory Visit: Payer: Self-pay

## 2019-04-05 ENCOUNTER — Ambulatory Visit: Payer: Self-pay

## 2019-04-05 DIAGNOSIS — O09522 Supervision of elderly multigravida, second trimester: Secondary | ICD-10-CM | POA: Diagnosis not present

## 2019-04-05 NOTE — Progress Notes (Signed)
Virtual Visit via Telephone Note  I connected with Margarito Courser on April 05, 2019 at  1:00 PM EST by telephone and verified that I am speaking with the correct person using two identifiers.  Referring Provider:   [facility] Department Length of Consultation: 35 minutes  Ms. Cantin was referred to [facility] for genetic counseling because of advanced maternal age.  The patient will be 39 years old at the time of delivery.  This note summarizes the information we discussed.    We explained that the chance of a chromosome abnormality increases with maternal age.  Chromosomes and examples of chromosome problems were reviewed.  Humans typically have 46 chromosomes in each cell, with half passed through each sperm and egg.  Any change in the number or structure of chromosomes can increase the risk of problems in the physical and mental development of a pregnancy.   Based upon age of the patient, the chance of any chromosome abnormality was 1 in 85. The chance of Down syndrome, the most common chromosome problem associated with maternal age, was 1 in 26.  The risk of chromosome problems is in addition to the 3% general population risk for birth defects and mental retardation.  The greatest chance, of course, is that the baby would be born in good health.  We discussed the following prenatal screening and testing options for this pregnancy:  Maternal serum marker screening, a blood test that measures pregnancy proteins, can provide risk assessments for Down syndrome, trisomy 18, and open neural tube defects (spina bifida, anencephaly). Because it does not directly examine the fetus, it cannot positively diagnose or rule out these problems. A quad screen was drawn at [facility] on 03/23/19 and results are within normal limits.  The chance for Down syndrome was reduced to 1 in 5,130; the chance for trisomy 18 is 1 in 1,190 and the chance for an open neural tube defect is  estimated to be 1 in 123,000.  Targeted ultrasound uses high frequency sound waves to create an image of the developing fetus.  An ultrasound is often recommended as a routine means of evaluating the pregnancy.  It is also used to screen for fetal anatomy problems (for example, a heart defect) that might be suggestive of a chromosomal or other abnormality.   Amniocentesis involves the removal of a small amount of amniotic fluid from the sac surrounding the fetus with the use of a thin needle inserted through the maternal abdomen and uterus.  Ultrasound guidance is used throughout the procedure.  Fetal cells from amniotic fluid are directly evaluated and > 99.5% of chromosome problems and > 98% of open neural tube defects can be detected. This procedure is generally performed after the 15th week of pregnancy.  The main risks to this procedure include complications leading to miscarriage in less than 1 in 200 cases (0.5%).  We also reviewed the availability of cell free fetal DNA testing from maternal blood to determine whether or not the baby may have either Down syndrome, trisomy 69, or trisomy 12.  This test utilizes a maternal blood sample and DNA sequencing technology to isolate circulating cell free fetal DNA from maternal plasma.  The fetal DNA can then be analyzed for DNA sequences that are derived from the three most common chromosomes involved in aneuploidy, chromosomes 13, 18, and 21.  If the overall amount of DNA is greater than the expected level for any of these chromosomes, aneuploidy is suspected.  While we do not consider it a replacement for invasive testing and karyotype analysis, a negative result from this testing would be reassuring, though not a guarantee of a normal chromosome complement for the baby.  An abnormal result is certainly suggestive of an abnormal chromosome complement, though we would still recommend amniocentesis to confirm any findings from this testing.  Cystic Fibrosis  and Spinal Muscular Atrophy (SMA) screening were also discussed with the patient. Both conditions are recessive, which means that both parents must be carriers in order to have a child with the disease.  Cystic fibrosis (CF) is one of the most common genetic conditions in persons of Caucasian ancestry.  This condition occurs in approximately 1 in 2,500 Caucasian persons and results in thickened secretions in the lungs, digestive, and reproductive systems.  For a baby to be at risk for having CF, both of the parents must be carriers for this condition.  Approximately 1 in 64 Caucasian persons is a carrier for CF.  Current carrier testing looks for the most common mutations in the gene for CF and can detect approximately 90% of carriers in the Caucasian population.  This means that the carrier screening can greatly reduce, but cannot eliminate, the chance for an individual to have a child with CF.  If an individual is found to be a carrier for CF, then carrier testing would be available for the partner. As part of [place] newborn screening profile, all babies born in the state of [place] will have a two-tier screening process.  Specimens are first tested to determine the concentration of immunoreactive trypsinogen (IRT).  The top 5% of specimens with the highest IRT values then undergo DNA testing using a panel of over 40 common CF mutations. SMA is a neurodegenerative disorder that leads to atrophy of skeletal muscle and overall weakness.  This condition is also more prevalent in the Caucasian population, with 1 in 40-1 in 60 persons being a carrier and 1 in 6,000-1 in 10,000 children being affected.  There are multiple forms of the disease, with some causing death in infancy to other forms with survival into adulthood.  The genetics of SMA is complex, but carrier screening can detect up to 95% of carriers in the Caucasian population.  Similar to CF, a negative result can greatly reduce, but cannot  eliminate, the chance to have a child with SMA. Hemoglobinopathy screening was also reviewed.  Prior testing at [facility] was normal for Ms. Marlatt (AA, MCV 95).  We obtained a detailed family history and pregnancy history.  There is uncertainty about the paternity of this pregnancy. In addition, the patient has no information about her biological father or his family. In the patient's maternal family, there is a strong history of seizures.  The patient's two half brothers had seizures as children that they have outgrown.  One of those brothers had developmental delays in school but now lives and works independently.  Her mother also has seizures, which may be related to alcohol use, but this is not clear.  The patient has one maternal aunt, a maternal first cousin and first cousin once removed with seizures as well.  One of those cousins did have onset after an accident.  We reviewed that the underlying cause for seizure disorders is often not well understood.  However, they may be part of some genetic syndromes or the result of exposures or trauma.  When there are numerous family members with the condition, we cannot rule out an  inherited component, though no details were revealed to suggest a specific syndrome.  Multifactorial inheritance, or the combination of genetic and environmental or lifestyle factors could be the cause as well.  If more is learned about the diagnosis in any of these relatives, we are happy to review this further.  Ms. Carrozza also reported that her 2 year old daughter was born with blindness.  The child was adopted out when she was 2 months old, so the patient has no additional information on her diagnosis.  We discussed that congenital blindness may have different causes, including prenatal illnesses or infections, genetic syndromes or other causes.  Without more specific information it is difficult to determine the chance for recurrence in other family members. The remainder of the family  history is unremarkable for birth defects, developmental delays, recurrent pregnancy loss or known chromosome abnormalities.  Ms. Standiford stated that this is her tenth pregnancy.  Her oldest two children, daughters ages 72 and 69, are in good health.  Her next two children, ages 32 and 24, were adopted outside of the family.  Her youngest two children, a 59 year old daughter and 62 year old son are in good health with normal growth and development.  The son had sleep myoclonus as an infant which resolved.  Her daughter was born preterm due to preeclampsia. She also had two early miscarriages and an ectopic pregnancy.  In the current pregnancy, she reported no complications.  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 However, she states that she has been clean since 12/07/19 (which would have been right around the time of conception) and therefore this fetus should not be at risk for birth defects or developmental differences related to those exposures.  She also had one abdominal xray which we would not expect to have resulted in a large enough radiation exposure to be harmful to the pregnancy. She continues to try to cut back on cigarettes, down to a "few" each day.  Because smoking during pregnancy has been associated with low birth weight, premature delivery and pregnancy loss, we encouraged her to continue to cut back or avoid smoking for the remainder of the pregnancy. Currently, she is taking prenatal vitamins and a baby aspirin daily due to the history of preeclampsia.  Ms. Cragun was unable to get a ride to our clinic today, but plans to contact us when she is able to plan for transportation to reschedule her detailed anatomy ultrasound.  She stated that she may consider the MaterniT21 testing after her ultrasound depending upon the results.  She felt reassured by the normal quad screen results.  Ms. Cowens was encouraged to call with questions or  concerns.  We can be contacted at [phone].   Tests Ordered: none  I provided 35 minutes of non-face-to-face time during this encounter.   Katrina Stack

## 2019-04-12 ENCOUNTER — Encounter: Payer: Self-pay | Admitting: Family Medicine

## 2019-04-12 ENCOUNTER — Ambulatory Visit
Admission: RE | Admit: 2019-04-12 | Discharge: 2019-04-12 | Disposition: A | Discharge status: Home / Self Care | Payer: Medicaid Other | Source: Ambulatory Visit | Attending: Maternal & Fetal Medicine | Admitting: Maternal & Fetal Medicine

## 2019-04-12 ENCOUNTER — Other Ambulatory Visit: Payer: Self-pay

## 2019-04-12 DIAGNOSIS — Z3689 Encounter for other specified antenatal screening: Secondary | ICD-10-CM

## 2019-04-12 DIAGNOSIS — R9389 Abnormal findings on diagnostic imaging of other specified body structures: Secondary | ICD-10-CM | POA: Insufficient documentation

## 2019-04-12 DIAGNOSIS — Z3A20 20 weeks gestation of pregnancy: Secondary | ICD-10-CM | POA: Insufficient documentation

## 2019-04-16 ENCOUNTER — Telehealth: Payer: Self-pay

## 2019-04-16 NOTE — Telephone Encounter (Signed)
Attempted to call to discuss change of appt. Due to no provider 04/20/19; no answer, left voicemail message Sharlette Dense, RN

## 2019-04-18 NOTE — Telephone Encounter (Signed)
Returned call-discussed need to reschedule appt; states will call back to schedule Sharlette Dense, RN

## 2019-04-18 NOTE — Telephone Encounter (Signed)
2nd attempt to reach client re: changing 04/20/19 appt; left voicemail messsage Sharlette Dense, RN

## 2019-04-19 NOTE — Addendum Note (Signed)
Addended by: Heywood Bene on: 04/19/2019 10:55 AM   Modules accepted: Orders

## 2019-04-20 ENCOUNTER — Ambulatory Visit: Payer: Self-pay

## 2019-05-01 ENCOUNTER — Ambulatory Visit: Payer: Self-pay

## 2019-05-11 ENCOUNTER — Encounter: Payer: Self-pay | Admitting: Family Medicine

## 2019-05-11 ENCOUNTER — Ambulatory Visit: Payer: Self-pay

## 2019-05-21 ENCOUNTER — Ambulatory Visit: Payer: Medicaid Other

## 2019-05-22 ENCOUNTER — Ambulatory Visit: Payer: Medicaid Other | Admitting: Family Medicine

## 2019-05-22 ENCOUNTER — Other Ambulatory Visit: Payer: Self-pay

## 2019-05-22 VITALS — BP 130/76 | HR 90 | Temp 98.4°F | Wt 156.6 lb

## 2019-05-22 DIAGNOSIS — Z98891 History of uterine scar from previous surgery: Secondary | ICD-10-CM

## 2019-05-22 DIAGNOSIS — K0889 Other specified disorders of teeth and supporting structures: Secondary | ICD-10-CM

## 2019-05-22 DIAGNOSIS — O0930 Supervision of pregnancy with insufficient antenatal care, unspecified trimester: Secondary | ICD-10-CM

## 2019-05-22 DIAGNOSIS — F1911 Other psychoactive substance abuse, in remission: Secondary | ICD-10-CM

## 2019-05-22 DIAGNOSIS — O0990 Supervision of high risk pregnancy, unspecified, unspecified trimester: Secondary | ICD-10-CM

## 2019-05-22 DIAGNOSIS — R9389 Abnormal findings on diagnostic imaging of other specified body structures: Secondary | ICD-10-CM

## 2019-05-22 DIAGNOSIS — O99330 Smoking (tobacco) complicating pregnancy, unspecified trimester: Secondary | ICD-10-CM

## 2019-05-22 DIAGNOSIS — Z8759 Personal history of other complications of pregnancy, childbirth and the puerperium: Secondary | ICD-10-CM

## 2019-05-22 MED ORDER — AMOXICILLIN-POT CLAVULANATE 875-125 MG PO TABS: 1.0000 | ORAL_TABLET | Freq: Two times a day (BID) | ORAL | 0 refills | Status: AC

## 2019-05-22 NOTE — Progress Notes (Signed)
PRENATAL VISIT NOTE  Subjective:  Erica [name] is a 39 y.o. [id number] at [redacted]w[redacted]d being seen today for ongoing prenatal care.  She is currently monitored for the following issues for this high-risk pregnancy and has History of cesarean section; [place] multipara; Supervision of high risk pregnancy, antepartum; Tobacco use in pregnancy, childbirth, or the puerperium, antepartum; History of pre-eclampsia; AMA (advanced maternal age) multigravida 61+; History of substance abuse ([place]); Ultrasound scan w/suboptimal cardiac views -- needs 28 wk recheck; History of inadequate prenatal care (no care x8 weeks); and Late prenatal care (@ 17 wks) on their problem list.  Patient reports toothache. States she has a hx of tooth abscesses. Has been in a lot of pain, taking tylenol which only helps a little. She has a dental appt this Thursday.    Contractions: Not present. Vag. Bleeding: None.  Movement: Present. Denies leaking of fluid/ROM.   The following portions of the patient's history were reviewed and updated as appropriate: allergies, current medications, past family history, past medical history, past social history, past surgical history and problem list. Problem list updated.  Objective:   Vitals:   05/22/19 1531  BP: 130/76  Pulse: 90  Temp: 98.4 F (36.9 C)  Weight: 156 lb 9.6 oz (71 kg)    Fetal Status: Fetal Heart Rate (bpm): 150 Fundal Height: 26 cm Movement: Present     General:  Alert, oriented and cooperative. Patient is in some distress d/t tooth pain.  Skin: Throat: Skin is warm and dry. No rash noted.   Poor dentition. Left lower gingiva erythematous, slightly tender, non fluctuant. Left upper molar broken, decayed w/surrounding gingiva erythematous, slightly tender, non fluctuant.  Cardiovascular: Normal heart rate noted  Respiratory: Normal respiratory effort, no problems with respiration noted  Abdomen: Soft, gravid, appropriate for gestational age.  Pain/Pressure: Absent      Pelvic: Cervical exam deferred        Extremities: Normal range of motion.  Edema: None  Mental Status: Normal mood and affect. Normal behavior. Normal judgment and thought content.   Assessment and Plan:  Pregnancy: [id number] at [redacted]w[redacted]d   1. Supervision of high risk pregnancy, antepartum -First prenatal visit in 2 months d/t being out of town. -Phq-9 score is 3, today. States she is doing well, though endorses stress d/t "legal issues." Continues in counseling with substance rehab group [facility], declines LCSW counseling.  2. Tooth pain -Discussed with Dr. Ernestina Patches. Will prescribe abx as below for possibly early abscess formation. Encouraged to keep dental appt in 2 days and continue tylenol prn. - amoxicillin-clavulanate (AUGMENTIN) 875-125 MG tablet; Take 1 tablet by mouth 2 (two) times daily for 10 days.  Dispense: 20 tablet; Refill: 0  3. Tobacco use in pregnancy, childbirth, or the puerperium, antepartum -Continues smoking 1/2 ppd, not yet at pt's goal of 5 cpd. Encouraged reduction/cessation for health of pregnancy.  4. History of substance abuse -States she continues abstinent of drugs, has 10 more classes left w/[facility].  -Urine drug screen last visit was negative.   5. History of vaginal birth after cesarean -Pt clarifies that she is interested in repeat c/s as she also wants BTL.  6. Ultrasound scan w/suboptimal cardiac views -- needs 28 wk recheck -Pt has f/u US at [facility] on 06/07/19 to assess fetal growth (tob use) and complete anatomic survey (3VV,IVC/SVC).  7. History of pre-eclampsia -BP wnl, no s/sx of preeclampsia.  8. History of inadequate prenatal care (no care x8 weeks) -States she has been out  of town, not able to make appts. Reiterated need for regular, frequent prenatal care for health of both mom and baby.  -OBCM notified.   Preterm labor symptoms and general obstetric precautions including but not limited to vaginal bleeding, contractions,  leaking of fluid and fetal movement were reviewed in detail with the patient. Please refer to After Visit Summary for other counseling recommendations.  Return in about 2 weeks (around 06/05/2019) for routine prenatal care.  Future Appointments  Date Time Provider Department Center  06/05/2019 10:40 AM [facility] PROVIDER [facility] None  06/07/2019  8:00 AM [facility] [place] [facility]    Ann Held, PA-C

## 2019-05-22 NOTE — Progress Notes (Addendum)
Patient here for MH RV at 26 weeks. Needs HCV test this week, or do with 28 week labs next week. Patient desires BTL, consent signed today. CCNC and PHQ9 today. Patient c/o mouth/tooth pain, states tylenol no longer helping. Patient aware of DP u/s on 06/07/2019.Marland KitchenBurt Knack, RN

## 2019-05-23 ENCOUNTER — Encounter: Payer: Self-pay | Admitting: Family Medicine

## 2019-05-23 DIAGNOSIS — O0930 Supervision of pregnancy with insufficient antenatal care, unspecified trimester: Secondary | ICD-10-CM | POA: Insufficient documentation

## 2019-05-23 HISTORY — DX: Supervision of pregnancy with insufficient antenatal care, unspecified trimester: O09.30

## 2019-06-04 ENCOUNTER — Other Ambulatory Visit: Payer: Self-pay | Admitting: Maternal & Fetal Medicine

## 2019-06-04 DIAGNOSIS — O09529 Supervision of elderly multigravida, unspecified trimester: Secondary | ICD-10-CM

## 2019-06-05 ENCOUNTER — Ambulatory Visit: Payer: Medicaid Other

## 2019-06-07 ENCOUNTER — Inpatient Hospital Stay
Admission: RE | Admit: 2019-06-07 | Discharge: 2019-06-07 | Disposition: A | Discharge status: Home / Self Care | Payer: Medicaid Other | Source: Ambulatory Visit

## 2019-06-07 NOTE — ED Notes (Signed)
Pt was a "No Show" today for her appt at [facility].

## 2019-06-08 ENCOUNTER — Ambulatory Visit: Payer: Medicaid Other

## 2019-06-13 ENCOUNTER — Telehealth: Payer: Self-pay | Admitting: Family Medicine

## 2019-06-13 NOTE — Telephone Encounter (Signed)
Called to reschedule.No answer, call drops at the end.

## 2019-06-15 NOTE — Telephone Encounter (Signed)
Attempted to call to reschedule visit and discuss rescheduling u/s appt missed on 06/07/19. No answer & no voicemail Sharlette Dense, RN

## 2019-06-28 ENCOUNTER — Ambulatory Visit: Payer: Medicaid Other

## 2019-06-30 ENCOUNTER — Encounter: Payer: Self-pay | Admitting: Family Medicine

## 2019-06-30 DIAGNOSIS — O0990 Supervision of high risk pregnancy, unspecified, unspecified trimester: Secondary | ICD-10-CM

## 2019-07-02 ENCOUNTER — Telehealth: Payer: Self-pay | Admitting: Family Medicine

## 2019-07-02 MED ORDER — PRENATAL VITAMIN 27-0.8 MG PO TABS: 1.0000 | ORAL_TABLET | Freq: Every day | ORAL | 1 refills | Status: DC

## 2019-07-02 MED ORDER — PREPLUS 27-1 MG PO TABS: 1.0000 | ORAL_TABLET | Freq: Every day | ORAL | 12 refills | Status: DC

## 2019-07-02 NOTE — Telephone Encounter (Signed)
Returned call-reports has been out of town; needs PNV Rx sent to pharmacy; reports daughter had Covid several weeks ago and now has congestion, sore throat.  Discussed getting tested-states has no transportation and rides the [employer] for appts.  Will f/u and call back.  Has RV appt 07/06/19 Sharlette Dense, RN

## 2019-07-05 ENCOUNTER — Encounter: Payer: Self-pay | Admitting: Physician Assistant

## 2019-07-05 NOTE — Progress Notes (Signed)
[facility] Department Maternity Care Conference  Maternity Care Conference Date: 07/05/19  Erica [name] was identified by clinical staff to benefit from an interdisciplinary team approach to help improve pregnancy care.  The [facility] Maternity Care Conference includes the maternity clinic coordinator (RN), medical providers (MD/APP staff), Care Management -OBCM and Healthy Beginnings, Centering Pregnancy coordinator, Infant Mortality reduction Dietitian.  Nursing staff are also encouraged to participate. The group meets monthly to discuss patient care and coordinate services.   The patient's care care at the agency was reviewed in EMR and high risk factors evaluated in an interdisciplinary approach.    Value added interventions discussed at this care conference today were: - concern for lapse in care, last seen 04/2019 - note has prenatal care appt 07/06/19 - Verified engaged with OBCM/Rasheda Marlan Palau

## 2019-07-06 ENCOUNTER — Other Ambulatory Visit: Payer: Self-pay

## 2019-07-06 ENCOUNTER — Ambulatory Visit: Payer: Medicaid Other | Admitting: Advanced Practice Midwife

## 2019-07-06 VITALS — BP 112/65 | HR 92 | Temp 98.4°F | Wt 155.8 lb

## 2019-07-06 DIAGNOSIS — Z9119 Patient's noncompliance with other medical treatment and regimen: Secondary | ICD-10-CM

## 2019-07-06 DIAGNOSIS — O09899 Supervision of other high risk pregnancies, unspecified trimester: Secondary | ICD-10-CM | POA: Insufficient documentation

## 2019-07-06 DIAGNOSIS — O0930 Supervision of pregnancy with insufficient antenatal care, unspecified trimester: Secondary | ICD-10-CM | POA: Insufficient documentation

## 2019-07-06 DIAGNOSIS — Z8751 Personal history of pre-term labor: Secondary | ICD-10-CM | POA: Insufficient documentation

## 2019-07-06 DIAGNOSIS — O0990 Supervision of high risk pregnancy, unspecified, unspecified trimester: Secondary | ICD-10-CM

## 2019-07-06 DIAGNOSIS — Z91199 Patient's noncompliance with other medical treatment and regimen due to unspecified reason: Secondary | ICD-10-CM | POA: Insufficient documentation

## 2019-07-06 DIAGNOSIS — Z98891 History of uterine scar from previous surgery: Secondary | ICD-10-CM

## 2019-07-06 DIAGNOSIS — F1911 Other psychoactive substance abuse, in remission: Secondary | ICD-10-CM

## 2019-07-06 DIAGNOSIS — O99330 Smoking (tobacco) complicating pregnancy, unspecified trimester: Secondary | ICD-10-CM

## 2019-07-06 DIAGNOSIS — Z8759 Personal history of other complications of pregnancy, childbirth and the puerperium: Secondary | ICD-10-CM

## 2019-07-06 LAB — WET PREP FOR TRICH, YEAST, CLUE
Trichomonas Exam: NEGATIVE
Yeast Exam: NEGATIVE

## 2019-07-06 LAB — URINALYSIS
Bilirubin, UA: NEGATIVE
Glucose, UA: NEGATIVE
Ketones, UA: NEGATIVE
Nitrite, UA: NEGATIVE
Protein,UA: NEGATIVE
RBC, UA: NEGATIVE
Specific Gravity, UA: 1.025 (ref 1.005–1.030)
Urobilinogen, Ur: 0.2 mg/dL (ref 0.2–1.0)
pH, UA: 6 (ref 5.0–7.5)

## 2019-07-06 LAB — HEMOGLOBIN, FINGERSTICK: Hemoglobin: 12.1 g/dL (ref 11.1–15.9)

## 2019-07-06 NOTE — Progress Notes (Signed)
Wet mount reviewed, no treatment indicated. Patient counseled to make 2 week maternity appointment and to expect a call from [facility] with DP U/S appointment and [facility] delivery plans appointment. Patient states her phone # is accurate. Kick counts reviewed today and cards given.Burt Knack, RN

## 2019-07-06 NOTE — Progress Notes (Addendum)
Patient here for MH RV at 32 3/7. Needs 28 week labs today, also requests STD testing. Needs 06/07/2019 DP U/S rescheduled. S/S PTL reviewed, literature given. Declines Tdap today. Needs to be to lab by 2:55pm for 3:07pm blood draw. G's and P's reviewed with patient. Needs delivery plans appointment. Patient also states that although she has MPW, she has no money for medication copays, and requests that we give her medication here if needed.Burt Knack, RN

## 2019-07-06 NOTE — Addendum Note (Signed)
Addended by: Burt Knack on: 07/06/2019 04:34 PM   Modules accepted: Orders

## 2019-07-06 NOTE — Progress Notes (Signed)
PRENATAL VISIT NOTE  Subjective:  Erica [name] is a 39 y.o. [id number] at [redacted]w[redacted]d being seen today for ongoing prenatal care. No prenatal care x 6 weeks; pt states she was in [place] visiting family/friends and has no license or transportation. She is currently monitored for the following issues for this high-risk pregnancy and has History of cesarean section; [place] multipara G10 [id number]; Supervision of high risk pregnancy, antepartum; Tobacco use in pregnancy, childbirth, or the puerperium, antepartum; History of pre-eclampsia; AMA (advanced maternal age) multigravida 35+; History of substance abuse (HCC); Ultrasound scan w/suboptimal cardiac views -- needs 28 wk recheck; History of inadequate prenatal care (no care x8 weeks); Late prenatal care (@ 17 wks); Noncompliant pregnant patient--no care x 6 weeks; Unsure paternity (2 men); and Hx of premature delivery x2 at 41 and 36 wks on their problem list.  Patient reports wanting all STD screening because had sex with new partner 2 wks ago "I know he gave me something, and he might be one of 2 men that is the father of this baby".  C/o creamy white/yellow d/c with external vaginal itching since then.  No prenatal care past 6 weeks.  Living with her mom in [place].  Man in room with pt.  Pt [facility] 06/07/19 [facility] f/u appt for cardiac views.  Pt states [facility] substance abuse rehab program told her she can't come back because she missed 2 appts and she plans to go to [place] and has an assessment appt there this week.  Not taking ASA 81 mg daily anymore.  .  Contractions: Not present. Vag. Bleeding: None.  Movement: Present. Denies leaking of fluid/ROM.   The following portions of the patient's history were reviewed and updated as appropriate: allergies, current medications, past family history, past medical history, past social history, past surgical history and problem list. Problem list updated.  Objective:   Vitals:   07/06/19 1402  BP:  112/65  Pulse: 92  Temp: 98.4 F (36.9 C)  Weight: 155 lb 12.8 oz (70.7 kg)    Fetal Status: Fetal Heart Rate (bpm): 150 Fundal Height: 32 cm Movement: Present     General:  Alert, oriented and cooperative. Patient is in no acute distress.  Skin: Skin is warm and dry. No rash noted.   Cardiovascular: Normal heart rate noted  Respiratory: Normal respiratory effort, no problems with respiration noted  Abdomen: Soft, gravid, appropriate for gestational age.  Pain/Pressure: Absent     Pelvic: Cervical exam deferred        Extremities: Normal range of motion.  Edema: None  Mental Status: Normal mood and affect. Normal behavior. Normal judgment and thought content.   Assessment and Plan:  Pregnancy: [id number] at [redacted]w[redacted]d  1. History of pre-eclampsia Urine dip neg proteinuria.  BP wnl today  2. Tobacco use in pregnancy, childbirth, or the puerperium, antepartum Pt claims she was smoking 1/2 ppd and now 5 cpd.  3. Supervision of high risk pregnancy, antepartum Pt asking for STD exam because "I know he gave me something"--new partner 2 wks ago without condom and now has c/o vaginitis sxs Unsure paternity between 2 men Pt has lost 1 lb in past 6 wks and claims she has enough food to eat. [facility] u/s appt rescheduled [facility] 06/07/19 f/u due to inadequate cardiac views) 1 hr glucola today R. Marlan Palau in to talk with pt  4. History of cesarean section [facility] delivery plans referral written  5. History of substance abuse (HCC) Pt states  she has never used MJ and last used cocaine before incarceration 11/2018.  Agrees to UDS today  6. Noncompliant pregnant patient, antepartum No prenatal care past 6 weeks  7. History of inadequate prenatal care (no care x8 weeks) Pt states she was visiting family/friends in [place] and has no license or car   Preterm labor symptoms and general obstetric precautions including but not limited to vaginal bleeding, contractions, leaking of fluid and  fetal movement were reviewed in detail with the patient. Please refer to After Visit Summary for other counseling recommendations.  Return in about 2 weeks (around 07/20/2019) for routine PNC.  Future Appointments  Date Time Provider [place]  07/20/2019  4:00 PM [facility] PROVIDER [facility] None    Herbie Saxon, CNM

## 2019-07-07 LAB — GLUCOSE, 1 HOUR GESTATIONAL: Gestational Diabetes Screen: 95 mg/dL (ref 65–139)

## 2019-07-07 LAB — HCV AB W REFLEX TO QUANT PCR: HCV Ab: <0.1 s/co ratio (ref 0.0–0.9)

## 2019-07-07 LAB — HIV ANTIBODY (ROUTINE TESTING W REFLEX): HIV Screen 4th Generation wRfx: NONREACTIVE

## 2019-07-07 LAB — RPR: RPR Ser Ql: NONREACTIVE

## 2019-07-07 LAB — HCV INTERPRETATION

## 2019-07-08 LAB — CHLAMYDIA/GC NAA, CONFIRMATION
Chlamydia trachomatis, NAA: NEGATIVE
Neisseria gonorrhoeae, NAA: NEGATIVE

## 2019-07-08 LAB — 789231 7+OXYCODONE-BUND
Amphetamines, Urine: NEGATIVE ng/mL
BENZODIAZ UR QL: NEGATIVE ng/mL
Barbiturate screen, urine: NEGATIVE ng/mL
Cannabinoid Quant, Ur: NEGATIVE ng/mL
Cocaine (Metab.): NEGATIVE ng/mL
OPIATE SCREEN URINE: NEGATIVE ng/mL
Oxycodone/Oxymorphone, Urine: NEGATIVE ng/mL
PCP Quant, Ur: NEGATIVE ng/mL

## 2019-07-09 ENCOUNTER — Other Ambulatory Visit: Payer: Self-pay | Admitting: Advanced Practice Midwife

## 2019-07-09 ENCOUNTER — Telehealth: Payer: Self-pay

## 2019-07-09 ENCOUNTER — Ambulatory Visit: Payer: Medicaid Other

## 2019-07-09 DIAGNOSIS — Z3689 Encounter for other specified antenatal screening: Secondary | ICD-10-CM

## 2019-07-09 NOTE — Telephone Encounter (Signed)
Phone call to patient at [phone] to inform of scheduled [facility] [place] appt for 07/23/2019 @ 2:00 (1:45.) No answer, no option given to leave a voicemail. Tawny Hopping, RN

## 2019-07-10 NOTE — Telephone Encounter (Signed)
Client notified of [facility] [place] appt on 07/23/2019 at 2:00 pm with 1:45 pm arrival time. Per client, wrote down appt. [name], RN

## 2019-07-20 ENCOUNTER — Ambulatory Visit (INDEPENDENT_AMBULATORY_CARE_PROVIDER_SITE_OTHER): Payer: Medicaid Other | Admitting: Obstetrics and Gynecology

## 2019-07-20 ENCOUNTER — Other Ambulatory Visit: Payer: Self-pay

## 2019-07-20 ENCOUNTER — Ambulatory Visit: Payer: Medicaid Other

## 2019-07-20 VITALS — BP 116/68 | Wt 154.0 lb

## 2019-07-20 DIAGNOSIS — Z3A34 34 weeks gestation of pregnancy: Secondary | ICD-10-CM

## 2019-07-20 DIAGNOSIS — O0933 Supervision of pregnancy with insufficient antenatal care, third trimester: Secondary | ICD-10-CM

## 2019-07-20 DIAGNOSIS — Z113 Encounter for screening for infections with a predominantly sexual mode of transmission: Secondary | ICD-10-CM

## 2019-07-20 DIAGNOSIS — O0930 Supervision of pregnancy with insufficient antenatal care, unspecified trimester: Secondary | ICD-10-CM

## 2019-07-20 DIAGNOSIS — O09522 Supervision of elderly multigravida, second trimester: Secondary | ICD-10-CM

## 2019-07-20 DIAGNOSIS — Z8759 Personal history of other complications of pregnancy, childbirth and the puerperium: Secondary | ICD-10-CM

## 2019-07-20 DIAGNOSIS — R9389 Abnormal findings on diagnostic imaging of other specified body structures: Secondary | ICD-10-CM

## 2019-07-20 DIAGNOSIS — Z641 Problems related to multiparity: Secondary | ICD-10-CM

## 2019-07-20 DIAGNOSIS — O09293 Supervision of pregnancy with other poor reproductive or obstetric history, third trimester: Secondary | ICD-10-CM

## 2019-07-20 DIAGNOSIS — Z8751 Personal history of pre-term labor: Secondary | ICD-10-CM

## 2019-07-20 DIAGNOSIS — N760 Acute vaginitis: Secondary | ICD-10-CM

## 2019-07-20 DIAGNOSIS — O99330 Smoking (tobacco) complicating pregnancy, unspecified trimester: Secondary | ICD-10-CM

## 2019-07-20 DIAGNOSIS — Z98891 History of uterine scar from previous surgery: Secondary | ICD-10-CM

## 2019-07-20 DIAGNOSIS — O0990 Supervision of high risk pregnancy, unspecified, unspecified trimester: Secondary | ICD-10-CM

## 2019-07-20 DIAGNOSIS — O09899 Supervision of other high risk pregnancies, unspecified trimester: Secondary | ICD-10-CM

## 2019-07-20 DIAGNOSIS — O09523 Supervision of elderly multigravida, third trimester: Secondary | ICD-10-CM

## 2019-07-20 DIAGNOSIS — O09213 Supervision of pregnancy with history of pre-term labor, third trimester: Secondary | ICD-10-CM

## 2019-07-20 DIAGNOSIS — O99333 Smoking (tobacco) complicating pregnancy, third trimester: Secondary | ICD-10-CM

## 2019-07-20 DIAGNOSIS — F1911 Other psychoactive substance abuse, in remission: Secondary | ICD-10-CM

## 2019-07-20 NOTE — Progress Notes (Signed)
[facility] Delivery plans

## 2019-07-20 NOTE — Progress Notes (Signed)
Routine Prenatal Care Visit  Subjective  DORNA MALLET is a 39 y.o. [id number] at [redacted]w[redacted]d being seen today for ongoing prenatal care.  She is currently monitored for the following issues for this high-risk pregnancy and has History of cesarean section; Grand multipara G10 [phone]; Supervision of high risk pregnancy, antepartum; Tobacco use in pregnancy, childbirth, or the puerperium, antepartum; History of pre-eclampsia; AMA (advanced maternal age) multigravida 35+; History of substance abuse (HCC); Ultrasound scan w/suboptimal cardiac views -- needs 28 wk recheck; History of inadequate prenatal care (no care x8 weeks); Late prenatal care (@ 17 wks); Noncompliant pregnant patient--no care x 6 weeks; Unsure paternity (2 men); and Hx of premature delivery x2 at 74 and 36 wks on their problem list.  ----------------------------------------------------------------------------------- Patient reports no complaints.   +FM, no LOF, no VB, no ctx.  ----------------------------------------------------------------------------------- The following portions of the patient's history were reviewed and updated as appropriate: allergies, current medications, past family history, past medical history, past social history, past surgical history and problem list. Problem list updated.   Objective  Blood pressure 116/68, weight 154 lb (69.9 kg), last menstrual period 01/17/2019, unknown if currently breastfeeding. Pregravid weight 122 lb (55.3 kg) Total Weight Gain 32 lb (14.5 kg)   Urinalysis:      Fetal Status: Heart tones 145  General:  Alert, oriented and cooperative. Patient is in no acute distress.  Skin: Skin is warm and dry. No rash noted.   Cardiovascular: Normal heart rate noted  Respiratory: Normal respiratory effort, no problems with respiration noted  Abdomen: Soft, gravid, appropriate for gestational age.       Pelvic:  Cervical exam deferred        Extremities: Normal range of motion.     ental  Status: Normal mood and affect. Normal behavior. Normal judgment and thought content.   Wet Prep: Clue Cells: Negative Fungal elements: Negative Trichomonas: Negative   Assessment   38 y.o. [id number] at [redacted]w[redacted]d by  08/28/2019, by Ultrasound presenting for routine prenatal visit  Plan   Gestational age appropriate obstetric precautions including but not limited to vaginal bleeding, contractions, leaking of fluid and fetal movement were reviewed in detail with the patient.    1) History prior cesarean section  Patient has history of prior vaginal deliveries, one cesarean section, and one successful VBAC.  We discussed mode of delivery given this history.  39 y.o. [id number] at [redacted]w[redacted]d with Estimated Date of Delivery: 08/28/19 was seen today in office to discuss trial of labor after cesarean section (TOLAC) versus elective repeat cesarean delivery (ERCD). The following risks were discussed with the patient.  Risk of uterine rupture at term is 0.78 percent with TOLAC and 0.22 percent with ERCD. 1 in 10 uterine ruptures will result in neonatal death or neurological injury. The benefits of a trial of labor after cesarean (TOLAC) resulting in a vaginal birth after cesarean (VBAC) include the following: shorter length of hospital stay and postpartum recovery (in most cases); fewer complications, such as postpartum fever, wound or uterine infection, thromboembolism (blood clots in the leg or lung), need for blood transfusion and fewer neonatal breathing problems. The risks of an attempted VBAC or TOLAC include the following: Risk of failed trial of labor after cesarean (TOLAC) without a vaginal birth after cesarean (VBAC) resulting in repeat cesarean delivery (RCD) in about 20 to 40 percent of women who attempt VBAC.  Her individualized success rate using the MFMU VBAC risk calculator is 94.5%%.   Risk of rupture  of uterus resulting in an emergency cesarean delivery. The risk of uterine rupture may be related in  part to the type of uterine incision made during the first cesarean delivery. A previous transverse uterine incision has the lowest risk of rupture (0.2 to 1.5 percent risk). Vertical or T-shaped uterine incisions have a higher risk of uterine rupture (4 to 9 percent risk)The risk of fetal death is very low with both VBAC and elective repeat cesarean delivery (ERCD), but the likelihood of fetal death is higher with VBAC than with ERCD. Maternal death is very rare with either type of delivery. The risks of an elective repeat cesarean delivery (ERCD) were reviewed with the patient including but not limited to: 02/998 risk of uterine rupture which could have serious consequences, bleeding which may require transfusion; infection which may require antibiotics; injury to bowel, bladder or other surrounding organs (bowel, bladder, ureters); injury to the fetus; need for additional procedures including hysterectomy in the event of a life-threatening hemorrhage; thromboembolic phenomenon; abnormal placentation; incisional problems; death and other postoperative or anesthesia complications.    In addition we discussed that our collective office practice is to allow patient's who desire to attempt TOLAC to go into labor naturally.  There is some limited data that rupture rate may increase past [redacted] weeks gestation, but it is reasonable for women who are strongly committed to [facility] to continue pregnancy into the 41st week.  Medical indications necessetating early delivery may arise during the course of any pregnancy.  Given the contraindication on the use of prostaglandins for use in cervical ripening,  recommendation would be to proceed with repeat cesarean for delivery for patient's with unfavorable cervix (low Bishops score) who reach 41 weeks or who otherwise have a medical indication for early delivery.   These risks and benefits are summarized on the consent form, which was reviewed with the patient during the visit.     MFMU VBAC calculator success rate of 94.5 with 95% CI of 93.6% to 95.4%.  The patient was counseled regarding recommendation for elective repeat cesarean section (ERCS) given that she has failed to go into spontaneous labor by [redacted]w[redacted]d gestation and has an unfavorable cervix.  Previous studies examining have noted an increased risk or maternal and fetal morbidity for patient attempting a trial of labor whose success rated is <70% compared to [facility], while morbidity was similar for patient attempting a trial of labor vs ERCS with success rates >70. ("Can a prediction model for vaginal birth after cesarean section also predict the probablity of  Morbidity related to a trial of labor?" American Jourcal of Obstetric and Gynecology 2009  January)  - Patient is interested in proceeding with TOLAC   2) Tubal ligation - does have history of prior ectopic pregnancy.  We discussed potential for tubal adhesive disease which may require interval tubal if unable to visualized tubes at time of postpartum tubal  3) Vaginal discharge - wet mount negative, nuswab sent   Return in about 1 year (around 07/19/2020), or if symptoms worsen or fail to improve.  Malachy Mood, MD, [facility], [facility] 07/24/2019, 5:57 PM

## 2019-07-23 ENCOUNTER — Telehealth: Payer: Self-pay

## 2019-07-23 ENCOUNTER — Encounter: Payer: Self-pay | Admitting: Emergency Medicine

## 2019-07-23 ENCOUNTER — Other Ambulatory Visit: Payer: Self-pay

## 2019-07-23 ENCOUNTER — Emergency Department
Admission: EM | Admit: 2019-07-23 | Discharge: 2019-07-23 | Disposition: A | Discharge status: Home / Self Care | Payer: Medicaid Other | Attending: Emergency Medicine | Admitting: Emergency Medicine

## 2019-07-23 ENCOUNTER — Other Ambulatory Visit: Payer: Self-pay | Admitting: Advanced Practice Midwife

## 2019-07-23 ENCOUNTER — Ambulatory Visit
Admission: RE | Admit: 2019-07-23 | Discharge: 2019-07-23 | Disposition: A | Discharge status: Home / Self Care | Payer: Medicaid Other | Source: Ambulatory Visit | Attending: Obstetrics and Gynecology | Admitting: Obstetrics and Gynecology

## 2019-07-23 DIAGNOSIS — Z3689 Encounter for other specified antenatal screening: Secondary | ICD-10-CM | POA: Insufficient documentation

## 2019-07-23 DIAGNOSIS — O99333 Smoking (tobacco) complicating pregnancy, third trimester: Secondary | ICD-10-CM | POA: Diagnosis not present

## 2019-07-23 DIAGNOSIS — F17210 Nicotine dependence, cigarettes, uncomplicated: Secondary | ICD-10-CM | POA: Diagnosis not present

## 2019-07-23 DIAGNOSIS — O26893 Other specified pregnancy related conditions, third trimester: Secondary | ICD-10-CM | POA: Insufficient documentation

## 2019-07-23 DIAGNOSIS — Z3A34 34 weeks gestation of pregnancy: Secondary | ICD-10-CM | POA: Diagnosis not present

## 2019-07-23 DIAGNOSIS — O34219 Maternal care for unspecified type scar from previous cesarean delivery: Secondary | ICD-10-CM | POA: Insufficient documentation

## 2019-07-23 DIAGNOSIS — O09523 Supervision of elderly multigravida, third trimester: Secondary | ICD-10-CM | POA: Diagnosis not present

## 2019-07-23 DIAGNOSIS — K047 Periapical abscess without sinus: Secondary | ICD-10-CM | POA: Insufficient documentation

## 2019-07-23 DIAGNOSIS — K0889 Other specified disorders of teeth and supporting structures: Secondary | ICD-10-CM | POA: Insufficient documentation

## 2019-07-23 MED ORDER — LIDOCAINE-EPINEPHRINE 2 %-1:100000 IJ SOLN
1.7000 mL | Freq: Once | INTRAMUSCULAR | Status: AC
  Administered 2019-07-23: 1.7 mL
  Filled 2019-07-23: qty 1.7

## 2019-07-23 MED ORDER — AMOXICILLIN 875 MG PO TABS: 875.0000 mg | ORAL_TABLET | Freq: Two times a day (BID) | ORAL | 0 refills | Status: DC

## 2019-07-23 MED ORDER — MAGIC MOUTHWASH W/LIDOCAINE
5.0000 mL | Freq: Four times a day (QID) | ORAL | 0 refills | Status: DC
Start: 2019-07-23 — End: 2019-08-20

## 2019-07-23 MED ORDER — AMOXICILLIN 500 MG PO CAPS
1000.0000 mg | ORAL_CAPSULE | Freq: Once | ORAL | Status: AC
  Administered 2019-07-23: 1000 mg via ORAL
  Filled 2019-07-23: qty 2

## 2019-07-23 NOTE — ED Triage Notes (Signed)
Patient presents to the ED with right sided dental pain that began yesterday morning.  Patient states she has a broken tooth on that side and has history of a tooth abscess.  Patient states she took tylenol for pain but it is still hurting.  Patient is [redacted] weeks pregnant.

## 2019-07-23 NOTE — ED Provider Notes (Signed)
[facility] Emergency Department Provider Note  ____________________________________________  Time seen: Approximately 5:27 PM  I have reviewed the triage vital signs and the nursing notes.   HISTORY  Chief Complaint Dental Pain    HPI Erica [name] is a 39 y.o. female who presents the emergency department complaining of right lower dental pain.  Patient states that she has a broken tooth and gum on the right lower dentition.  She is having pain, swelling in this region.  No difficulty breathing or swallowing.  No fevers or chills.  She is [redacted] weeks pregnant but denies any pregnancy complications.  Patient states that when this happens it is typically an infection and she needs antibiotics.  Unable to have any dental work at this time given her pregnancy status.  No other complaints at this time.         Past Medical History:  Diagnosis Date  . Abnormal Pap smear   . Anemia   . Anxiety    as teen  . Anxiety   . Depression    as teen  . GERD (gastroesophageal reflux disease)   . Headache(784.0)   . No pertinent past medical history   . Pneumonia   . Pregnancy induced hypertension   . Preterm labor   . Vaginal Pap smear, abnormal   . VBAC, delivered, current hospitalization 05/19/2011    Patient Active Problem List   Diagnosis Date Noted  . Noncompliant pregnant patient--no care x 6 weeks 07/06/2019  . Unsure paternity (2 men) 07/06/2019  . Hx of premature delivery x2 at 6835 and 36 wks 07/06/2019  . History of inadequate prenatal care (no care x8 weeks) 05/23/2019  . Late prenatal care (@ 17 wks) 05/23/2019  . Ultrasound scan w/suboptimal cardiac views -- needs 28 wk recheck 04/12/2019  . Grand multipara G10 [phone] 03/23/2019  . Supervision of high risk pregnancy, antepartum 03/23/2019  . Tobacco use in pregnancy, childbirth, or the puerperium, antepartum 03/23/2019  . History of pre-eclampsia 03/23/2019  . AMA (advanced maternal age) multigravida  35+ 03/23/2019  . History of substance abuse (HCC) 03/23/2019  . History of cesarean section 05/19/2011    Past Surgical History:  Procedure Laterality Date  . APPENDECTOMY    . CESAREAN SECTION    . COLPOSCOPY    . DILATION AND CURETTAGE OF UTERUS  2010  . LAPAROSCOPIC APPENDECTOMY N/A 07/11/2016   Procedure: APPENDECTOMY LAPAROSCOPIC;  Surgeon: Ricarda FrameWoodham, Charles, MD;  Location: [facility] ORS;  Service: General;  Laterality: N/A;  . WISDOM TOOTH EXTRACTION      Prior to Admission medications   Medication Sig Start Date End Date Taking? Authorizing Provider  acetaminophen (TYLENOL) 325 MG tablet Take 650 mg by mouth every 6 (six) hours as needed (for a toothache).    [provider]  amoxicillin (AMOXIL) 875 MG tablet Take 1 tablet (875 mg total) by mouth 2 (two) times daily. 07/23/19   Akeen Ledyard, Delorise RoyalsJonathan D, PA-C  magic mouthwash w/lidocaine SOLN Take 5 mLs by mouth 4 (four) times daily. Swish and spit 07/23/19   Danniel Grenz, Delorise RoyalsJonathan D, PA-C  Prenatal Vit-Fe Fumarate-FA (PRENATAL VITAMIN) 27-0.8 MG TABS Take 1 tablet by mouth daily. 07/02/19   Sciora, Austin MilesElizabeth A, CNM    Allergies Levaquin [levofloxacin in d5w]  Family History  Problem Relation Age of Onset  . Hypertension Mother   . Seizures Mother   . Hypertension Maternal Grandmother   . Arthritis Maternal Grandmother   . Cancer Maternal Grandmother  breast  . Stroke Maternal Grandfather   . Seizures Brother   . Birth defects Daughter        hole in heart  . Seizures Maternal Aunt   . Seizures Brother   . Seizures Cousin   . Seizures Cousin   . Mental retardation Cousin     Social History Social History   Tobacco Use  . Smoking status: Current Every Day Smoker    Packs/day: 0.50    Years: 13.00    Pack years: 6.50    Types: Cigarettes  . Smokeless tobacco: Never Used  Vaping Use  . Vaping Use: Never used  Substance Use Topics  . Alcohol use: Not Currently    Comment: 12/06/2018  . Drug use: Not  Currently    Types: Cocaine     Review of Systems  Constitutional: No fever/chills Eyes: No visual changes. No discharge ENT: Positive for right-sided dental infection/pain Cardiovascular: no chest pain. Respiratory: no cough. No SOB. Gastrointestinal: No abdominal pain.  No nausea, no vomiting.  No diarrhea.  No constipation. Musculoskeletal: Negative for musculoskeletal pain. Skin: Negative for rash, abrasions, lacerations, ecchymosis. Neurological: Negative for headaches, focal weakness or numbness. 10-point ROS otherwise negative.  ____________________________________________   PHYSICAL EXAM:  VITAL SIGNS: ED Triage Vitals  Enc Vitals Group     BP 07/23/19 1644 (!) 129/92     Pulse Rate 07/23/19 1644 99     Resp 07/23/19 1644 20     Temp 07/23/19 1644 99.5 F (37.5 C)     Temp Source 07/23/19 1644 Oral     SpO2 07/23/19 1644 99 %     Weight 07/23/19 1628 154 lb (69.9 kg)     Height 07/23/19 1628 5\' 4"  (1.626 m)     Head Circumference --      Peak Flow --      Pain Score 07/23/19 1628 10     Pain Loc --      Pain Edu? --      Excl. in GC? --      Constitutional: Alert and oriented. Well appearing and in no acute distress. Eyes: Conjunctivae are normal. PERRL. EOMI. Head: Atraumatic. ENT:      Ears:       Nose: No congestion/rhinnorhea.      Mouth/Throat: Mucous membranes are moist.  Poor dentition throughout.  Patient specifically has dental erosion to the gumline around tooth #30.  This has surrounding erythema and edema.  No fluctuance concerning for appreciable drainable abscess.  Tonsils and uvula is unremarkable.  No erythema, edema noted to submandibular region or anterior neck.  No tenderness Neck: No stridor.   Hematological/Lymphatic/Immunilogical: No cervical lymphadenopathy. Cardiovascular: Normal rate, regular rhythm. Normal S1 and S2.  Good peripheral circulation. Respiratory: Normal respiratory effort without tachypnea or retractions. Lungs CTAB.  Good air entry to the bases with no decreased or absent breath sounds. Musculoskeletal: Full range of motion to all extremities. No gross deformities appreciated. Neurologic:  Normal speech and language. No gross focal neurologic deficits are appreciated.  Skin:  Skin is warm, dry and intact. No rash noted. Psychiatric: Mood and affect are normal. Speech and behavior are normal. Patient exhibits appropriate insight and judgement.   ____________________________________________   LABS (all labs ordered are listed, but only abnormal results are displayed)  Labs Reviewed - No data to display ____________________________________________  EKG   ____________________________________________  RADIOLOGY    ____________________________________________    PROCEDURES  Procedure(s) performed:    .Nerve Block  Date/Time: 07/23/2019 5:44 PM Performed by: Racheal Patches, PA-C Authorized by: Racheal Patches, PA-C   Consent:    Consent obtained:  Verbal   Consent given by:  Patient   Risks discussed:  Pain and unsuccessful block Indications:    Indications:  Pain relief Location:    Body area:  Head   Head nerve blocked: inferior alveolar nerve and buccal nerve.   Laterality:  Right Skin anesthesia (see MAR for exact dosages):    Skin anesthesia method:  None Procedure details (see MAR for exact dosages):    Block needle gauge:  27 G   Anesthetic injected:  Lidocaine 2% WITH epi   Steroid injected:  None   Additive injected:  None   Injection procedure:  Anatomic landmarks identified, introduced needle, negative aspiration for blood and incremental injection Post-procedure details:    Dressing:  None   Outcome:  Anesthesia achieved   Patient tolerance of procedure:  Tolerated well, no immediate complications      Medications  lidocaine-EPINEPHrine (XYLOCAINE W/EPI) 2 %-1:100000 (with pres) injection 1.7 mL (1.7 mLs Infiltration Given by Other 07/23/19 1742)   lidocaine-EPINEPHrine (XYLOCAINE W/EPI) 2 %-1:100000 (with pres) injection 1.7 mL (1.7 mLs Infiltration Given by Other 07/23/19 1742)  amoxicillin (AMOXIL) capsule 1,000 mg (1,000 mg Oral Given 07/23/19 1740)     ____________________________________________   INITIAL IMPRESSION / ASSESSMENT AND PLAN / ED COURSE  Pertinent labs & imaging results that were available during my care of the patient were reviewed by me and considered in my medical decision making (see chart for details).  Review of the [place] CSRS was performed in accordance of the [facility] prior to dispensing any controlled drugs.           Patient's diagnosis is consistent with dental infection, pregnant.  Patient presented to emergency department complaining of right lower dental pain/infection.  Patient states that she has had abscesses in this area before.  She is [redacted] weeks pregnant and is unable to take medicine other than Tylenol for pain.  She states that she is also unable to receive dental work given her pregnancy status.  Patient does have infection but no evidence of abscess.  No concern for deep space infection.  No labs or imaging at this time.  Patient is given dental block for symptom relief.  She should continue Tylenol at home.  She started on amoxicillin for infection.  She will be given prescription for Magic mouthwash to swish and spit to provide additional symptom relief in addition to her Tylenol.  Follow-up with dentist after delivery.  Follow-up with OB/GYN as needed for pregnancy complaints..  Patient is given ED precautions to return to the ED for any worsening or new symptoms.     ____________________________________________  FINAL CLINICAL IMPRESSION(S) / ED DIAGNOSES  Final diagnoses:  Dental abscess      NEW MEDICATIONS STARTED DURING THIS VISIT:  ED Discharge Orders         Ordered    amoxicillin (AMOXIL) 875 MG tablet  2 times daily     Discontinue  Reprint     07/23/19 1747    magic  mouthwash w/lidocaine SOLN  4 times daily     Discontinue  Reprint    Note to Pharmacy: Dispense in a 1/1/1 ratio. Use lidocaine, diphenhydramine, prednisolone   07/23/19 1747              This chart was dictated using voice recognition software/Dragon. Despite best efforts to proofread,  errors can occur which can change the meaning. Any change was purely unintentional.    Racheal Patches, PA-C 07/23/19 1753    Emily Filbert, MD 07/23/19 Zollie Pee

## 2019-07-23 NOTE — Progress Notes (Signed)
Pt to [facility] appt today for [place] and c/o pain 10/10 to right side of mouth.  Pt states, "I think I have a tooth abscess".  Pt taking Tylenol for pain that does not help. Pt wanted to have [place] completed then wanted to go to the Emergency Department for tooth pain.

## 2019-07-23 NOTE — Telephone Encounter (Signed)
Pt returned call; is currently being seen for u/s at MFM; wants antibx/pain med; adv that is not is our scope of practice; needs to see her dentist or go to the ED.  Pt handed phone to [facility] c M[facility]; states they told the pt the same thing; will take pt via wheelchair to ED if pt agreeable.

## 2019-07-23 NOTE — Telephone Encounter (Signed)
Pt called after hour nurse 07/22/19 7pm c/o 35w; teeth pain top left and bottom right; 9/10 on pain scale; tylenol doesn't help; has tried rinsing c oral gel mouth rinse, salt water; and peroxide rinses; no redness, drainage, fever; is a little puffy on right side of face.  [phone]  [facility]

## 2019-07-23 NOTE — Telephone Encounter (Signed)
Received clerical message from patient stating she has a "right bottom tooth abcess that she needs to be seen for." Patient states her gum is swollen and rates her pain a 10 out of 10. Denies fever with complaints. Reports good fetal movement. RN counseled patient to go to local ED for evaluation of above complaint. Patient states "can't y'all just give me an antibiotic of something?" RN recommended a second time that patient go to local ED for evaluation of above complaint. Patient agreeable. Tawny Hopping, RN

## 2019-07-23 NOTE — Telephone Encounter (Signed)
I was not consulted but have read RN documentation and agree with plan. 

## 2019-07-23 NOTE — ED Triage Notes (Signed)
Dental pain right lower side. Pt is anxious and says she has swelling in her throat. Pt reports it began yesterday morning. Pt has no difficulty speaking or maintaining secretions.

## 2019-07-24 ENCOUNTER — Other Ambulatory Visit: Payer: Self-pay | Admitting: Obstetrics and Gynecology

## 2019-07-24 LAB — NUSWAB VAGINITIS PLUS (VG+)
Candida albicans, NAA: NEGATIVE
Candida glabrata, NAA: NEGATIVE
Chlamydia trachomatis, NAA: NEGATIVE
Neisseria gonorrhoeae, NAA: NEGATIVE
Trich vag by NAA: POSITIVE — AB

## 2019-07-24 MED ORDER — METRONIDAZOLE 500 MG PO TABS: 2000.0000 mg | ORAL_TABLET | Freq: Once | ORAL | 0 refills | Status: AC

## 2019-07-25 ENCOUNTER — Other Ambulatory Visit: Payer: Self-pay

## 2019-07-25 ENCOUNTER — Telehealth: Payer: Self-pay

## 2019-07-25 ENCOUNTER — Ambulatory Visit: Payer: Medicaid Other | Admitting: Advanced Practice Midwife

## 2019-07-25 VITALS — BP 119/69 | HR 83 | Temp 98.4°F | Wt 153.4 lb

## 2019-07-25 DIAGNOSIS — Z8751 Personal history of pre-term labor: Secondary | ICD-10-CM

## 2019-07-25 DIAGNOSIS — A5901 Trichomonal vulvovaginitis: Secondary | ICD-10-CM

## 2019-07-25 DIAGNOSIS — O23593 Infection of other part of genital tract in pregnancy, third trimester: Secondary | ICD-10-CM | POA: Insufficient documentation

## 2019-07-25 DIAGNOSIS — O0990 Supervision of high risk pregnancy, unspecified, unspecified trimester: Secondary | ICD-10-CM

## 2019-07-25 DIAGNOSIS — F1911 Other psychoactive substance abuse, in remission: Secondary | ICD-10-CM

## 2019-07-25 DIAGNOSIS — A599 Trichomoniasis, unspecified: Secondary | ICD-10-CM

## 2019-07-25 DIAGNOSIS — O99330 Smoking (tobacco) complicating pregnancy, unspecified trimester: Secondary | ICD-10-CM

## 2019-07-25 DIAGNOSIS — O09523 Supervision of elderly multigravida, third trimester: Secondary | ICD-10-CM

## 2019-07-25 DIAGNOSIS — Z8759 Personal history of other complications of pregnancy, childbirth and the puerperium: Secondary | ICD-10-CM

## 2019-07-25 MED ORDER — CLOTRIMAZOLE 1 % VA CREA: 1.0000 | TOPICAL_CREAM | Freq: Every day | VAGINAL | 0 refills | Status: AC

## 2019-07-25 MED ORDER — METRONIDAZOLE 500 MG PO TABS
2000.0000 mg | ORAL_TABLET | Freq: Once | ORAL | 0 refills | Status: AC
Start: 2019-07-25 — End: 2019-07-25

## 2019-07-25 NOTE — Progress Notes (Signed)
PRENATAL VISIT NOTE  Subjective:  Erica [name] is a 39 y.o. [id number] smoker at [redacted]w[redacted]d being seen today for ongoing prenatal care.  She is currently monitored for the following issues for this high-risk pregnancy and has History of successful vaginal birth after cesarean, currently pregnant; Grand multipara G10 [phone]; Supervision of high risk pregnancy, antepartum; Tobacco use in pregnancy, childbirth, or the puerperium, antepartum; History of pre-eclampsia; AMA (advanced maternal age) multigravida 35+; History of substance abuse (HCC); Ultrasound scan w/suboptimal cardiac views -- needs 28 wk recheck; History of inadequate prenatal care (no care x8 weeks); Late prenatal care (@ 17 wks); Noncompliant pregnant patient--no care x 6 weeks; Unsure paternity (2 men); and Hx of premature delivery x2 at 23 and 36 wks on their problem list.  Patient reports mouth pain and on antibiotics for tooth infection from ER.  Contractions: Not present. Vag. Bleeding: None.  Movement: Present. Denies leaking of fluid/ROM.   The following portions of the patient's history were reviewed and updated as appropriate: allergies, current medications, past family history, past medical history, past social history, past surgical history and problem list. Problem list updated.  Objective:   Vitals:   07/25/19 1344  BP: 119/69  Pulse: 83  Temp: 98.4 F (36.9 C)  Weight: 153 lb 6.4 oz (69.6 kg)    Fetal Status: Fetal Heart Rate (bpm): 120 Fundal Height: 35 cm Movement: Present     General:  Alert, oriented and cooperative. Patient is in no acute distress.  Skin: Skin is warm and dry. No rash noted.   Cardiovascular: Normal heart rate noted  Respiratory: Normal respiratory effort, no problems with respiration noted  Abdomen: Soft, gravid, appropriate for gestational age.  Pain/Pressure: Absent     Pelvic: Cervical exam deferred        Extremities: Normal range of motion.  Edema: None  Mental Status: Normal mood and  affect. Normal behavior. Normal judgment and thought content.   Assessment and Plan:  Pregnancy: [id number] at [redacted]w[redacted]d  1. History of pre-eclampsia BP wnl today  2. History of substance abuse (HCC) Denies use  3. Multigravida of advanced maternal age in third trimester 39 yo  4. Tobacco use in pregnancy, childbirth, or the puerperium, antepartum Smoking 1/2 ppd--counseled to stop via 5 A's   5. Supervision of high risk pregnancy, antepartum Been to ER again 07/23/19 for tooth pain--given nerve block, po Amoxicillin 875 mg BID and told to go to dentist.. Pt made appt for 09/04/19 at [facility] for dental care (because she is leaving tomorrow for the beach for 9 days) Reviewed delivery plans note at [facility] 07/20/19--TOLAC risk calculator=94.5%.  Pt desired TOLAC and BTL pp.  Wet mount done at that time with +trich but they were unable to reach pt by phone to tx her.  (This provider did not see this until after pt had left--RN to call to return for tx today) Reviewed 07/23/19 u/s with EFW=33%, HC <3% "wnl without microcephaly", AFI wnl, anterior placenta, normal anatomy at 33 5/7. Due to [facility], needs weekly NST/AFI's--to schedule with [facility] due to high risk status Knows when to go to L&D.  No car seat yet.  Declined Tdap. Pt states she can't return in 1 week because "I paid a lot of money to go to the beach for 9 days and I'm going". Pt counseled not medically advised to travel after 35 wks, especially with her hx of PTD x2 and pt insists she is going to her  beach vacation. Not taking ASA daily    Preterm labor symptoms and general obstetric precautions including but not limited to vaginal bleeding, contractions, leaking of fluid and fetal movement were reviewed in detail with the patient. Please refer to After Visit Summary for other counseling recommendations.  Return in about 1 week (around 08/01/2019) for routine PNC.  No future appointments.  Alberteen Spindle,  CNM

## 2019-07-25 NOTE — Progress Notes (Signed)
In for MH RV at 35.1 weeks. Continues PNV. Seen at ED on 6/28 for tooth abscess. Taking amoxicillin, magic mouthwash, tylenol (taking 2 pills Q4h.) Counseled on appropriate tylenol dosage. Dental services list given.   Kick count cards given. To return in 1 week. Sharlyne Pacas, RN

## 2019-07-25 NOTE — Telephone Encounter (Signed)
After client left visit-07/20/19 [facility] lab seen re: +Trich & they haven't reached client; cell # immediate busy signal; left message @ mobile# Sharlette Dense, RN

## 2019-07-25 NOTE — Progress Notes (Addendum)
Returned to clinic-discussed +Trich result and treated per standing order Sharlette Dense, RN  Client requesting Yeast med since on Amoxicillin and now will take Metronidazole-per E. Sciora, CNM given Clotrimazole 1 % vag. Cream -1 applicator full q hs x 7d to use if develops s/s yeast infection Sharlette Dense, RN

## 2019-07-25 NOTE — Telephone Encounter (Signed)
Returned call-informed of infection and needs Tx-[facility] unable to reach; to come back for Tx Sharlette Dense, RN

## 2019-07-25 NOTE — Addendum Note (Signed)
Addended by: Sharlette Dense on: 07/25/2019 03:34 PM   Modules accepted: Orders

## 2019-08-02 ENCOUNTER — Encounter: Payer: Self-pay | Admitting: Family Medicine

## 2019-08-02 DIAGNOSIS — O0990 Supervision of high risk pregnancy, unspecified, unspecified trimester: Secondary | ICD-10-CM

## 2019-08-02 NOTE — Progress Notes (Signed)
[facility] Department Maternity Care Conference  Maternity Care Conference Date: 08/02/19  Erica [name] was identified by clinical staff to benefit from an interdisciplinary team approach to help improve pregnancy care.  The [facility] Maternity Care Conference includes the maternity clinic coordinator (RN), medical providers (MD/APP staff), Care Management -OBCM and Healthy Beginnings, Centering Pregnancy coordinator, Infant Mortality reduction Dietitian.  Nursing staff are also encouraged to participate. The group meets monthly to discuss patient care and coordinate services.   The patient's care care at the agency was reviewed in EMR and high risk factors evaluated in an interdisciplinary approach.    Value added interventions discussed at this care conference today were: - Reviewed [place] from 6/28- heart views WNL - No longer seeing [facility] for substance abuse counseling, recommended offering appt with behavioral health at next visit - Discussed getting UDS next visit given lack of counseling involvement and recent sobriety (<1 year) with poor prenatal care compliance - Patient desires BTL and confirmed patient signed 30d consent for medicaid 05/22/2019 - Recently dx with trichomonas and was treated, needs routine GC/CT screening at next visit - OBCM not present for case review, appreciate continued involvement with client.  - Does not have next prenatal care appointment scheduled -- Maternity Coordinator Sharlette Dense to coordinate

## 2019-08-06 ENCOUNTER — Encounter: Payer: Self-pay | Admitting: Obstetrics and Gynecology

## 2019-08-06 ENCOUNTER — Ambulatory Visit: Payer: Medicaid Other

## 2019-08-06 ENCOUNTER — Observation Stay
Admission: EM | Admit: 2019-08-06 | Discharge: 2019-08-07 | Disposition: A | Discharge status: Home / Self Care | Payer: Medicaid Other | Attending: Obstetrics and Gynecology | Admitting: Obstetrics and Gynecology

## 2019-08-06 ENCOUNTER — Other Ambulatory Visit: Payer: Self-pay

## 2019-08-06 DIAGNOSIS — O23593 Infection of other part of genital tract in pregnancy, third trimester: Principal | ICD-10-CM | POA: Insufficient documentation

## 2019-08-06 DIAGNOSIS — Z8619 Personal history of other infectious and parasitic diseases: Secondary | ICD-10-CM | POA: Insufficient documentation

## 2019-08-06 DIAGNOSIS — N898 Other specified noninflammatory disorders of vagina: Secondary | ICD-10-CM

## 2019-08-06 DIAGNOSIS — Z3A37 37 weeks gestation of pregnancy: Secondary | ICD-10-CM | POA: Insufficient documentation

## 2019-08-06 DIAGNOSIS — O26893 Other specified pregnancy related conditions, third trimester: Secondary | ICD-10-CM

## 2019-08-06 DIAGNOSIS — F17210 Nicotine dependence, cigarettes, uncomplicated: Secondary | ICD-10-CM | POA: Insufficient documentation

## 2019-08-06 DIAGNOSIS — Z3A36 36 weeks gestation of pregnancy: Secondary | ICD-10-CM | POA: Insufficient documentation

## 2019-08-06 DIAGNOSIS — O4700 False labor before 37 completed weeks of gestation, unspecified trimester: Secondary | ICD-10-CM

## 2019-08-06 DIAGNOSIS — Z881 Allergy status to other antibiotic agents status: Secondary | ICD-10-CM | POA: Insufficient documentation

## 2019-08-06 DIAGNOSIS — O99333 Smoking (tobacco) complicating pregnancy, third trimester: Secondary | ICD-10-CM | POA: Insufficient documentation

## 2019-08-06 DIAGNOSIS — Z8751 Personal history of pre-term labor: Secondary | ICD-10-CM | POA: Insufficient documentation

## 2019-08-06 NOTE — ED Triage Notes (Addendum)
Reports sudden onset of blurry vision to left eye while watching TV that lasted for approx 30 minutes, vision back to normal. Reports headache to frontal part of head over last 4 days, intermittent contractions and possibly leaking fluid.  Discussed with Dr. Manson Passey, pt appropriate to go upstairs.

## 2019-08-06 NOTE — OB Triage Note (Signed)
Pt is a 38y/o [id number] at [redacted]w[redacted]d with c/o blurred vision, pressure headaches, intermittent ctx, diarrhea, and an increase in discharge beginning four days. [place]. Pt stated she had been dx with trich and completed treatment at the beach. Pt states she completed abx for a tooth abscess. Pt states +FM. Pt denies VB. Monitors applied and assessing. Initial FHT 135.

## 2019-08-07 ENCOUNTER — Other Ambulatory Visit: Payer: Self-pay | Admitting: Obstetrics

## 2019-08-07 DIAGNOSIS — Z3A36 36 weeks gestation of pregnancy: Secondary | ICD-10-CM

## 2019-08-07 DIAGNOSIS — F17210 Nicotine dependence, cigarettes, uncomplicated: Secondary | ICD-10-CM | POA: Diagnosis not present

## 2019-08-07 DIAGNOSIS — Z881 Allergy status to other antibiotic agents status: Secondary | ICD-10-CM | POA: Diagnosis not present

## 2019-08-07 DIAGNOSIS — Z8751 Personal history of pre-term labor: Secondary | ICD-10-CM | POA: Diagnosis not present

## 2019-08-07 DIAGNOSIS — N898 Other specified noninflammatory disorders of vagina: Secondary | ICD-10-CM

## 2019-08-07 DIAGNOSIS — O23593 Infection of other part of genital tract in pregnancy, third trimester: Secondary | ICD-10-CM | POA: Diagnosis present

## 2019-08-07 DIAGNOSIS — Z8619 Personal history of other infectious and parasitic diseases: Secondary | ICD-10-CM | POA: Diagnosis not present

## 2019-08-07 DIAGNOSIS — B9689 Other specified bacterial agents as the cause of diseases classified elsewhere: Secondary | ICD-10-CM

## 2019-08-07 DIAGNOSIS — O99333 Smoking (tobacco) complicating pregnancy, third trimester: Secondary | ICD-10-CM | POA: Diagnosis not present

## 2019-08-07 DIAGNOSIS — O479 False labor, unspecified: Secondary | ICD-10-CM

## 2019-08-07 DIAGNOSIS — Z3A37 37 weeks gestation of pregnancy: Secondary | ICD-10-CM | POA: Diagnosis not present

## 2019-08-07 DIAGNOSIS — O4700 False labor before 37 completed weeks of gestation, unspecified trimester: Secondary | ICD-10-CM

## 2019-08-07 DIAGNOSIS — O26893 Other specified pregnancy related conditions, third trimester: Secondary | ICD-10-CM

## 2019-08-07 LAB — OB RESULTS CONSOLE GC/CHLAMYDIA
Chlamydia: NEGATIVE
Gonorrhea: NEGATIVE

## 2019-08-07 LAB — URINALYSIS, ROUTINE W REFLEX MICROSCOPIC
Bilirubin Urine: NEGATIVE
Glucose, UA: NEGATIVE mg/dL
Hgb urine dipstick: NEGATIVE
Ketones, ur: NEGATIVE mg/dL
Nitrite: NEGATIVE
Protein, ur: 30 mg/dL — AB
RBC / HPF: >50 RBC/hpf — ABNORMAL HIGH (ref 0–5)
Specific Gravity, Urine: 1.033 — ABNORMAL HIGH (ref 1.005–1.030)
pH: 5 (ref 5.0–8.0)

## 2019-08-07 LAB — RAPID HIV SCREEN (HIV 1/2 AB+AG)
HIV 1/2 Antibodies: NONREACTIVE
HIV-1 P24 Antigen - HIV24: NONREACTIVE

## 2019-08-07 LAB — URINE DRUG SCREEN, QUALITATIVE (ARMC ONLY)
Amphetamines, Ur Screen: NOT DETECTED
Barbiturates, Ur Screen: NOT DETECTED
Benzodiazepine, Ur Scrn: NOT DETECTED
Cannabinoid 50 Ng, Ur ~~LOC~~: NOT DETECTED
Cocaine Metabolite,Ur ~~LOC~~: NOT DETECTED
MDMA (Ecstasy)Ur Screen: NOT DETECTED
Methadone Scn, Ur: NOT DETECTED
Opiate, Ur Screen: NOT DETECTED
Phencyclidine (PCP) Ur S: NOT DETECTED
Tricyclic, Ur Screen: NOT DETECTED

## 2019-08-07 LAB — CBC
HCT: 34 % — ABNORMAL LOW (ref 36.0–46.0)
Hemoglobin: 11.5 g/dL — ABNORMAL LOW (ref 12.0–15.0)
MCH: 31.9 pg (ref 26.0–34.0)
MCHC: 33.8 g/dL (ref 30.0–36.0)
MCV: 94.4 fL (ref 80.0–100.0)
Platelets: 257 10*3/uL (ref 150–400)
RBC: 3.6 MIL/uL — ABNORMAL LOW (ref 3.87–5.11)
RDW: 14.3 % (ref 11.5–15.5)
WBC: 10.2 10*3/uL (ref 4.0–10.5)
nRBC: 0 % (ref 0.0–0.2)

## 2019-08-07 LAB — TYPE AND SCREEN
ABO/RH(D): O POS
Antibody Screen: NEGATIVE

## 2019-08-07 LAB — CHLAMYDIA/NGC RT PCR (ARMC ONLY)
Chlamydia Tr: NOT DETECTED
N gonorrhoeae: NOT DETECTED

## 2019-08-07 MED ORDER — LACTATED RINGERS IV SOLN: INTRAVENOUS | Status: DC

## 2019-08-07 MED ORDER — METRONIDAZOLE 500 MG PO TABS: 250.0000 mg | ORAL_TABLET | Freq: Two times a day (BID) | ORAL | 0 refills | Status: DC

## 2019-08-07 NOTE — Final Progress Note (Signed)
Final Progress Note  Patient ID: Erica [name] MRN: [id number] DOB/AGE: Feb 23, 1980 38 y.o.  Admit date: 08/06/2019 Admitting provider: Mirna Mires, CNM Discharge date: 08/07/2019   Admission Diagnoses: IUP 36 weeks 6 days, lower abdominal pain, vaginal discharge  Discharge Diagnoses:  Active Problems:   [redacted] weeks gestation of pregnancy   Vaginal discharge during pregnancy in third trimester   Preterm uterine contractions BV  History of Present Illness: The patient is a 39 y.o. female [id number] at [redacted]w[redacted]d who presents for evaluation of a vaginal discharge she has noticed over the last few weeks.In addition she reports an episode of slight headache this evening accompanied by left eye pressure that stopped after several minutes. She is seen for prenatal care at the [facility], with late onset of care, missed appointments. Her problem list includes: late onset of care; Grandmultipara (G10 P 6), insufficient prenatal care, tobacco use in pregnancy, Trichomonas,  Hx of pre eclampsia; hx of previous CS; AMA; hx of premature deliveries x 2; hx of substance abuse. She was treated recently for Trich, and shares that her discharge decreased, but recently feels that it has increased again.She also c/o some mild contractions. Her baby has been moving well.   Past Medical History:  Diagnosis Date  . Abnormal Pap smear   . Anemia   . Anxiety    as teen  . Anxiety   . Depression    as teen  . GERD (gastroesophageal reflux disease)   . Headache(784.0)   . No pertinent past medical history   . Pneumonia   . Pregnancy induced hypertension   . Preterm labor   . Vaginal Pap smear, abnormal   . VBAC, delivered, current hospitalization 05/19/2011    Past Surgical History:  Procedure Laterality Date  . APPENDECTOMY    . CESAREAN SECTION    . COLPOSCOPY    . DILATION AND CURETTAGE OF UTERUS  2010  . LAPAROSCOPIC APPENDECTOMY N/A 07/11/2016   Procedure: APPENDECTOMY LAPAROSCOPIC;  Surgeon: Ricarda Frame, MD;  Location: [facility] ORS;  Service: General;  Laterality: N/A;  . WISDOM TOOTH EXTRACTION      No current facility-administered medications on file prior to encounter.   Current Outpatient Medications on File Prior to Encounter  Medication Sig Dispense Refill  . acetaminophen (TYLENOL) 325 MG tablet Take 650 mg by mouth every 6 (six) hours as needed (for a toothache).    . magic mouthwash w/lidocaine SOLN Take 5 mLs by mouth 4 (four) times daily. Swish and spit 240 mL 0  . Prenatal Vit-Fe Fumarate-FA (PRENATAL VITAMIN) 27-0.8 MG TABS Take 1 tablet by mouth daily. 30 tablet 1  . amoxicillin (AMOXIL) 875 MG tablet Take 1 tablet (875 mg total) by mouth 2 (two) times daily. (Patient not taking: Reported on 08/06/2019) 14 tablet 0    Allergies  Allergen Reactions  . Levaquin [Levofloxacin In D5w] Swelling    Lip swelling, hives, chest tightness    Social History   Socioeconomic History  . Marital status: Widowed    Spouse name: Not on file  . Number of children: 6  . Years of education: 22  . Highest education level: GED or equivalent  Occupational History  . Occupation: English as a second language teacher  Tobacco Use  . Smoking status: Current Every Day Smoker    Packs/day: 0.50    Years: 13.00    Pack years: 6.50    Types: Cigarettes  . Smokeless tobacco: Never Used  Vaping Use  . Vaping Use: Never  used  Substance and Sexual Activity  . Alcohol use: Not Currently    Comment: 12/06/2018  . Drug use: Not Currently    Types: Cocaine  . Sexual activity: Yes    Birth control/protection: Surgical    Comment: BTL  Other Topics Concern  . Not on file  Social History Narrative  . Not on file   Social Determinants of Health   Financial Resource Strain: Low Risk   . Difficulty of Paying Living Expenses: Not very hard  Food Insecurity: No Food Insecurity  . Worried About Programme researcher, broadcasting/film/video in the Last Year: Never true  . Ran Out of Food in the Last Year: Never true   Transportation Needs: No Transportation Needs  . Lack of Transportation (Medical): No  . Lack of Transportation (Non-Medical): No  Physical Activity:   . Days of Exercise per Week:   . Minutes of Exercise per Session:   Stress:   . Feeling of Stress :   Social Connections:   . Frequency of Communication with Friends and Family:   . Frequency of Social Gatherings with Friends and Family:   . Attends Religious Services:   . Active Member of Clubs or Organizations:   . Attends Banker Meetings:   Marland Kitchen Marital Status:   Intimate Partner Violence: Not At Risk  . Fear of Current or Ex-Partner: No  . Emotionally Abused: No  . Physically Abused: No  . Sexually Abused: No    Family History  Problem Relation Age of Onset  . Hypertension Mother   . Seizures Mother   . Hypertension Maternal Grandmother   . Arthritis Maternal Grandmother   . Cancer Maternal Grandmother        breast  . Stroke Maternal Grandfather   . Seizures Brother   . Birth defects Daughter        hole in heart  . Seizures Maternal Aunt   . Seizures Brother   . Seizures Cousin   . Seizures Cousin   . Mental retardation Cousin      ROS   Physical Exam: BP 113/68   Pulse 73   Temp 98.3 F (36.8 C) (Oral)   Resp 18   Ht 5\' 4"  (1.626 m)   LMP 01/17/2019   SpO2 100%   BMI 26.33 kg/m   OBGyn Exam  Consults: None  Significant Findings/ Diagnostic Studies: labs ordered and pending: CBC, RPR, GC/CT, rapid HIV, GBS  Procedures: EFM NST Baseline FHR: 135 beats/min Variability: moderate Accelerations: present Decelerations: absent Tocometry: ucs noted , irregular, q 6-10 minutes, mild  Interpretation:  INDICATIONS: rule out uterine contractions RESULTS:  A NST procedure was performed with FHR monitoring and a normal baseline established, appropriate time of 20-40 minutes of evaluation, and accels >2 seen w 15x15 characteristics.  Results show a REACTIVE NST.    Hospital Course: The  patient was admitted to Labor and Delivery Triage for observation. Treated with IV hydration and labs drawn to check for STIs, as well as GBS culture.    Discharge Condition: good  Disposition: Discharge disposition: 01-Home or Self Care       Diet: Regular diet  Discharge Activity: Activity as tolerated and No sex for 3 weeks Follow up at her regular prenatal provider this week.   Allergies as of 08/07/2019      Reactions   Levaquin [levofloxacin In D5w] Swelling   Lip swelling, hives, chest tightness  Total time spent taking care of this patient: 60 minutes  Signed: Mirna Mires, CNM  08/07/2019, 6:57 AM

## 2019-08-07 NOTE — Discharge Instructions (Signed)
Trichomoniasis Trichomoniasis is an STI (sexually transmitted infection) that can affect both women and men. In women, the outer area of the female genitalia (vulva) and the vagina are affected. In men, mainly the penis is affected, but the prostate and other reproductive organs can also be involved.  This condition can be treated with medicine. It often has no symptoms (is asymptomatic), especially in men. If not treated, trichomoniasis can last for months or years. What are the causes? This condition is caused by a parasite called Trichomonas vaginalis. Trichomoniasis most often spreads from person to person (is contagious) through sexual contact. What increases the risk? The following factors may make you more likely to develop this condition:  Having unprotected sex.  Having sex with a partner who has trichomoniasis.  Having multiple sexual partners.  Having had previous trichomoniasis infections or other STIs. What are the signs or symptoms? In women, symptoms of trichomoniasis include:  Abnormal vaginal discharge that is clear, white, gray, or yellow-green and foamy and has an unusual "fishy" odor.  Itching and irritation of the vagina and vulva.  Burning or pain during urination or sex.  Redness and swelling of the genitals. In men, symptoms of trichomoniasis include:  Penile discharge that may be foamy or contain pus.  Pain in the penis. This may happen only when urinating.  Itching or irritation inside the penis.  Burning after urination or ejaculation. How is this diagnosed? In women, this condition may be found during a routine Pap test or physical exam. It may be found in men during a routine physical exam. Your health care provider may do tests to help diagnose this infection, such as:  Urine tests (men and women).  The following in women: ? Testing the pH of the vagina. ? A vaginal swab test that checks for the Trichomonas vaginalis parasite. ? Testing vaginal  secretions. Your health care provider may test you for other STIs, including HIV (human immunodeficiency virus). How is this treated? This condition is treated with medicine taken by mouth (orally), such as metronidazole or tinidazole, to fight the infection. Your sexual partner(s) also need to be tested and treated.  If you are a woman and you plan to become pregnant or think you may be pregnant, tell your health care provider right away. Some medicines that are used to treat the infection should not be taken during pregnancy. Your health care provider may recommend over-the-counter medicines or creams to help relieve itching or irritation. You may be tested for infection again 3 months after treatment. Follow these instructions at home:  Take and use over-the-counter and prescription medicines, including creams, only as told by your health care provider.  Take your antibiotic medicine as told by your health care provider. Do not stop taking the antibiotic even if you start to feel better.  Do not have sex until 7-10 days after you finish your medicine, or until your health care provider approves. Ask your health care provider when you may start to have sex again.  (Women) Do not douche or wear tampons while you have the infection.  Discuss your infection with your sexual partner(s). Make sure that your partner gets tested and treated, if necessary.  Keep all follow-up visits as told by your health care provider. This is important. How is this prevented?   Use condoms every time you have sex. Using condoms correctly and consistently can help protect against STIs.  Avoid having multiple sexual partners.  Talk with your sexual partner about any   symptoms that either of you may have, as well as any history of STIs.  Get tested for STIs and STDs (sexually transmitted diseases) before you have sex. Ask your partner to do the same.  Do not have sexual contact if you have symptoms of  trichomoniasis or another STI. Contact a health care provider if:  You still have symptoms after you finish your medicine.  You develop pain in your abdomen.  You have pain when you urinate.  You have bleeding after sex.  You develop a rash.  You feel nauseous or you vomit.  You plan to become pregnant or think you may be pregnant. Summary  Trichomoniasis is an STI (sexually transmitted infection) that can affect both women and men.  This condition often has no symptoms (is asymptomatic), especially in men.  Without treatment, this condition can last for months or years.  You should not have sex until 7-10 days after you finish your medicine, or until your health care provider approves. Ask your health care provider when you may start to have sex again.  Discuss your infection with your sexual partner(s). Make sure that your partner gets tested and treated, if necessary. This information is not intended to replace advice given to you by your health care provider. Make sure you discuss any questions you have with your health care provider. Document Revised: 10/25/2017 Document Reviewed: 10/25/2017 Elsevier Patient Education  2020 Elsevier Inc.  

## 2019-08-07 NOTE — Discharge Summary (Signed)
See Final Progress Note  Erica [name], [facility]  08/07/2019 6:57 AM

## 2019-08-08 ENCOUNTER — Telehealth: Payer: Self-pay | Admitting: Family Medicine

## 2019-08-08 LAB — RPR: RPR Ser Ql: NONREACTIVE

## 2019-08-08 NOTE — Telephone Encounter (Signed)
Returned phone call to patient. Spoke with patient which reports she has "been throwing up and having diarrhea since she started taking that Flagyl from the emergency room." Patient states she started "that medicine yesterday and started throwing up this morning." States she has vomited x1 since this am and is able to keep fluids down. Also reports she has been having loose stools for a week. Patient also states she has had Trich "for months and can't get rid of it." Patient states she does not have a partner and is not having sex." RN assessed if patient was taking Flagyl with food states "yes." Patient states she has been dehydrated too. RN counseled for patient to discontinue Flagyl and to increase fluids until she can come in for scheduled RV appt tomorrow 08/09/19 @ 3:40. Patient reports good fetal movement and is agreeable to keep tomorrows scheduled appt. Tawny Hopping, RN

## 2019-08-08 NOTE — Telephone Encounter (Signed)
I agree with RN.

## 2019-08-08 NOTE — Addendum Note (Signed)
Addended by: Heywood Bene on: 08/08/2019 12:46 PM   Modules accepted: Orders

## 2019-08-09 ENCOUNTER — Ambulatory Visit: Payer: Medicaid Other | Admitting: Physician Assistant

## 2019-08-09 ENCOUNTER — Other Ambulatory Visit: Payer: Self-pay

## 2019-08-09 VITALS — BP 116/73 | HR 87 | Temp 98.3°F | Wt 159.2 lb

## 2019-08-09 DIAGNOSIS — O0990 Supervision of high risk pregnancy, unspecified, unspecified trimester: Secondary | ICD-10-CM

## 2019-08-09 DIAGNOSIS — F1911 Other psychoactive substance abuse, in remission: Secondary | ICD-10-CM

## 2019-08-09 DIAGNOSIS — O23593 Infection of other part of genital tract in pregnancy, third trimester: Secondary | ICD-10-CM

## 2019-08-09 DIAGNOSIS — A5901 Trichomonal vulvovaginitis: Secondary | ICD-10-CM

## 2019-08-09 MED ORDER — METRONIDAZOLE 500 MG PO TABS: 2000.0000 mg | ORAL_TABLET | Freq: Once | ORAL | 0 refills | Status: AC

## 2019-08-09 NOTE — Progress Notes (Signed)
° °  PRENATAL VISIT NOTE  Subjective:  Erica [name] is a 39 y.o. [id number] at [redacted]w[redacted]d being seen today for ongoing prenatal care.  She is currently monitored for the following issues for this high-risk pregnancy and has History of successful vaginal birth after cesarean, currently pregnant; Grand multipara G10 [phone]; Supervision of high risk pregnancy, antepartum; Tobacco use in pregnancy, childbirth, or the puerperium, antepartum; History of pre-eclampsia; AMA (advanced maternal age) multigravida 35+; History of substance abuse (HCC); History of inadequate prenatal care (no care x8 weeks); Late prenatal care (@ 17 wks); Noncompliant pregnant patient--no care x 6 weeks; Unsure paternity (2 men); Hx of premature delivery x2 at 30 and 36 wks; Trichomonal vaginitis in pregnancy, third trimester; [redacted] weeks gestation of pregnancy; Vaginal discharge during pregnancy in third trimester; and Preterm uterine contractions on their problem list.  Patient reports nausea. Having trouble taking half metronidazole tabs for Trich. Requests 2 g dose..  Contractions: Irregular. Vag. Bleeding: None.  Movement: Present. Denies leaking of fluid/ROM.   The following portions of the patient's history were reviewed and updated as appropriate: allergies, current medications, past family history, past medical history, past social history, past surgical history and problem list. Problem list updated.  Objective:   Vitals:   08/09/19 1548  BP: 116/73  Pulse: 87  Temp: 98.3 F (36.8 C)  Weight: 159 lb 3.2 oz (72.2 kg)    Fetal Status: Fetal Heart Rate (bpm): 132 Fundal Height: 37 cm Movement: Present  Presentation: Vertex  General:  Alert, oriented and cooperative. Patient is in no acute distress.  Skin: Skin is warm and dry. No rash noted.   Cardiovascular: Normal heart rate noted  Respiratory: Normal respiratory effort, no problems with respiration noted  Abdomen: Soft, gravid, appropriate for gestational age.   Pain/Pressure: Absent     Pelvic: Cervical exam deferred        Extremities: Normal range of motion.     Mental Status: Normal mood and affect. Normal behavior. Normal judgment and thought content.   Assessment and Plan:  Pregnancy: [id number] at [redacted]w[redacted]d  1. Trichomonal vaginitis in pregnancy, third trimester Metronidazole 2g po - please give.  2. Supervision of high risk pregnancy, antepartum Reviewed neg GC/Chlam test results. Meeting with OBCM today. - Culture, beta strep (group b only)  3. History of substance abuse [facility]) Reviewed recent neg UDS and gave "Plan of Safe Care" doc. Enc ongoing abstinence.    Term labor symptoms and general obstetric precautions including but not limited to vaginal bleeding, contractions, leaking of fluid and fetal movement were reviewed in detail with the patient. Please refer to After Visit Summary for other counseling recommendations.  Return in about 1 week (around 08/16/2019) for Routine prenatal care.  No future appointments.  Landry Dyke, PA-C

## 2019-08-09 NOTE — Progress Notes (Signed)
Tearful when called to clinic. R. Marlan Palau MSW, OBCM in room talking with client. Client declined self-collection of GBS cultrue (gonorrhea and chlamydia negative from recent ED visit). [name], RN Treated for trich with Metronidazole 2 grams po per verbal order of A. Streilein PA-C. [name], RN

## 2019-08-12 LAB — CULTURE, BETA STREP (GROUP B ONLY): Strep Gp B Culture: POSITIVE — AB

## 2019-08-13 DIAGNOSIS — O99820 Streptococcus B carrier state complicating pregnancy: Secondary | ICD-10-CM | POA: Insufficient documentation

## 2019-08-16 ENCOUNTER — Ambulatory Visit: Payer: Medicaid Other

## 2019-08-17 ENCOUNTER — Other Ambulatory Visit: Payer: Self-pay

## 2019-08-17 ENCOUNTER — Ambulatory Visit: Payer: Medicaid Other | Admitting: Family Medicine

## 2019-08-17 VITALS — BP 108/70 | HR 76 | Temp 98.1°F | Wt 158.2 lb

## 2019-08-17 DIAGNOSIS — Z3A38 38 weeks gestation of pregnancy: Secondary | ICD-10-CM

## 2019-08-17 DIAGNOSIS — O23593 Infection of other part of genital tract in pregnancy, third trimester: Secondary | ICD-10-CM

## 2019-08-17 DIAGNOSIS — A5901 Trichomonal vulvovaginitis: Secondary | ICD-10-CM

## 2019-08-17 DIAGNOSIS — Z641 Problems related to multiparity: Secondary | ICD-10-CM

## 2019-08-17 DIAGNOSIS — O99820 Streptococcus B carrier state complicating pregnancy: Secondary | ICD-10-CM

## 2019-08-17 DIAGNOSIS — O99330 Smoking (tobacco) complicating pregnancy, unspecified trimester: Secondary | ICD-10-CM

## 2019-08-17 DIAGNOSIS — Z8759 Personal history of other complications of pregnancy, childbirth and the puerperium: Secondary | ICD-10-CM

## 2019-08-17 DIAGNOSIS — O09523 Supervision of elderly multigravida, third trimester: Secondary | ICD-10-CM

## 2019-08-17 DIAGNOSIS — Z8751 Personal history of pre-term labor: Secondary | ICD-10-CM

## 2019-08-17 DIAGNOSIS — O0990 Supervision of high risk pregnancy, unspecified, unspecified trimester: Secondary | ICD-10-CM

## 2019-08-17 DIAGNOSIS — F1911 Other psychoactive substance abuse, in remission: Secondary | ICD-10-CM

## 2019-08-17 NOTE — Progress Notes (Signed)
PRENATAL VISIT NOTE  Subjective:  Erica [name] is a 39 y.o. [id number] at 68w3dbeing seen today for ongoing prenatal care.  She is currently monitored for the following issues for this high-risk pregnancy and has History of successful vaginal birth after cesarean, currently pregnant; GNocateemultipara G10 [phone] Supervision of high risk pregnancy, antepartum; Tobacco use in pregnancy, childbirth, or the puerperium, antepartum; History of pre-eclampsia; AMA (advanced maternal age) multigravida 390+ History of substance abuse ([place]; History of inadequate prenatal care (no care x8 weeks); Late prenatal care (@ 17 wks); Noncompliant pregnant patient--no care x 6 weeks; Unsure paternity (2 men); Hx of premature delivery x2 at 343and 36 wks; Trichomonal vaginitis in pregnancy, third trimester; Vaginal discharge during pregnancy in third trimester; Preterm uterine contractions; Group B Streptococcus carrier, +RV culture, currently pregnant; Term pregnancy; Normal labor and delivery; and Postpartum care following vaginal delivery on their problem list.  Patient reports no complaints.  Contractions: Irregular. Vag. Bleeding: None.  Movement: Present. Denies leaking of fluid/ROM.   The following portions of the patient's history were reviewed and updated as appropriate: allergies, current medications, past family history, past medical history, past social history, past surgical history and problem list. Problem list updated.  Objective:   Vitals:   08/17/19 1427  BP: 108/70  Pulse: 76  Temp: 98.1 F (36.7 C)  Weight: 158 lb 3.2 oz (71.8 kg)    Fetal Status: Fetal Heart Rate (bpm): 135 Fundal Height: 38 cm Movement: Present  Presentation: Vertex  General:  Alert, oriented and cooperative. Patient is in no acute distress.  Skin: Skin is warm and dry. No rash noted.   Cardiovascular: Normal heart rate noted  Respiratory: Normal respiratory effort, no problems with respiration noted  Abdomen: Soft,  gravid, appropriate for gestational age.  Pain/Pressure: Absent     Pelvic: Cervical exam performed Dilation: 2 Effacement (%): 50 Station: -3  Extremities: Normal range of motion.     Mental Status: Normal mood and affect. Normal behavior. Normal judgment and thought content.   Assessment and Plan:  Pregnancy: [id number] 378w3d. Group B Streptococcus carrier, +RV culture, currently pregnant Patient aware   2. Hx of premature delivery x2 at 3545nd 36 wks Now term!  3. GrKalkaskaultipara G10 [phone] has high PPH risk  LD to monitor  4. Supervision of high risk pregnancy, antepartum Up to date Awaiting SOL, plan for IOL at 39 wk if not delivered per patient this was the plan [facility] who approved her TOLAC. She is quite favorable today and we discussed an elective IOL at 39 weeks and she is very interested in this to help with childcare and other life planning. I have filled out the form and sent to the [facility] revie  Met with OBCM today  5. Tobacco use in pregnancy, childbirth, or the puerperium, antepartum Decreased use but continued tobacco use  6. History of pre-eclampsia BP wnl today  7. Multigravida of advanced maternal age in third trimester  8. History of substance abuse ([place] UDSx2 negative Continues to report abstaining from use.  Encouraged continued sobriety    Term labor symptoms and general obstetric precautions including but not limited to vaginal bleeding, contractions, leaking of fluid and fetal movement were reviewed in detail with the patient.  Please refer to After Visit Summary for other counseling recommendations.  Return in about 1 week (around 08/24/2019) for Routine prenatal care.  No future appointments.  KiCaren Macadam  MD 

## 2019-08-17 NOTE — Progress Notes (Signed)
[facility] MFM referral faxed /30/21 for weekly AFI/NST. Refaxed 08/17/2019 and RN called scheduler this pm for appt. As previously seen by MFM, scheduler unable to schedule appt and RN will need to schedule directly with MFM clinic on 08/20/2019. [name], RN Urine dip reviewed. [name], RN IOL referral with demographics and records faxed to [facility] with fax confirmation received. [name], RN

## 2019-08-18 ENCOUNTER — Encounter: Payer: Self-pay | Admitting: Obstetrics and Gynecology

## 2019-08-18 ENCOUNTER — Other Ambulatory Visit: Payer: Self-pay

## 2019-08-18 ENCOUNTER — Inpatient Hospital Stay: Payer: Medicaid Other | Admitting: Anesthesiology

## 2019-08-18 ENCOUNTER — Inpatient Hospital Stay
Admission: EM | Admit: 2019-08-18 | Discharge: 2019-08-20 | DRG: 806 | Disposition: A | Discharge status: Home / Self Care | Payer: Medicaid Other | Attending: Obstetrics and Gynecology | Admitting: Obstetrics and Gynecology

## 2019-08-18 DIAGNOSIS — O09293 Supervision of pregnancy with other poor reproductive or obstetric history, third trimester: Secondary | ICD-10-CM

## 2019-08-18 DIAGNOSIS — O322XX0 Maternal care for transverse and oblique lie, not applicable or unspecified: Principal | ICD-10-CM | POA: Diagnosis present

## 2019-08-18 DIAGNOSIS — F17210 Nicotine dependence, cigarettes, uncomplicated: Secondary | ICD-10-CM | POA: Diagnosis present

## 2019-08-18 DIAGNOSIS — Z20822 Contact with and (suspected) exposure to covid-19: Secondary | ICD-10-CM | POA: Diagnosis present

## 2019-08-18 DIAGNOSIS — O99324 Drug use complicating childbirth: Secondary | ICD-10-CM | POA: Diagnosis present

## 2019-08-18 DIAGNOSIS — O99824 Streptococcus B carrier state complicating childbirth: Secondary | ICD-10-CM | POA: Diagnosis present

## 2019-08-18 DIAGNOSIS — O99820 Streptococcus B carrier state complicating pregnancy: Secondary | ICD-10-CM

## 2019-08-18 DIAGNOSIS — O09213 Supervision of pregnancy with history of pre-term labor, third trimester: Secondary | ICD-10-CM | POA: Diagnosis not present

## 2019-08-18 DIAGNOSIS — O0933 Supervision of pregnancy with insufficient antenatal care, third trimester: Secondary | ICD-10-CM

## 2019-08-18 DIAGNOSIS — A5901 Trichomonal vulvovaginitis: Secondary | ICD-10-CM

## 2019-08-18 DIAGNOSIS — F1410 Cocaine abuse, uncomplicated: Secondary | ICD-10-CM | POA: Diagnosis present

## 2019-08-18 DIAGNOSIS — Z3A38 38 weeks gestation of pregnancy: Secondary | ICD-10-CM | POA: Diagnosis not present

## 2019-08-18 DIAGNOSIS — O09523 Supervision of elderly multigravida, third trimester: Secondary | ICD-10-CM

## 2019-08-18 DIAGNOSIS — O99333 Smoking (tobacco) complicating pregnancy, third trimester: Secondary | ICD-10-CM | POA: Diagnosis not present

## 2019-08-18 DIAGNOSIS — O0990 Supervision of high risk pregnancy, unspecified, unspecified trimester: Secondary | ICD-10-CM

## 2019-08-18 DIAGNOSIS — F1210 Cannabis abuse, uncomplicated: Secondary | ICD-10-CM | POA: Diagnosis present

## 2019-08-18 DIAGNOSIS — O23593 Infection of other part of genital tract in pregnancy, third trimester: Secondary | ICD-10-CM

## 2019-08-18 DIAGNOSIS — Z3490 Encounter for supervision of normal pregnancy, unspecified, unspecified trimester: Secondary | ICD-10-CM

## 2019-08-18 DIAGNOSIS — O34219 Maternal care for unspecified type scar from previous cesarean delivery: Secondary | ICD-10-CM | POA: Diagnosis present

## 2019-08-18 DIAGNOSIS — O26893 Other specified pregnancy related conditions, third trimester: Secondary | ICD-10-CM | POA: Diagnosis present

## 2019-08-18 DIAGNOSIS — O99334 Smoking (tobacco) complicating childbirth: Secondary | ICD-10-CM | POA: Diagnosis present

## 2019-08-18 DIAGNOSIS — F1911 Other psychoactive substance abuse, in remission: Secondary | ICD-10-CM

## 2019-08-18 HISTORY — DX: Encounter for supervision of normal pregnancy, unspecified, unspecified trimester: Z34.90

## 2019-08-18 LAB — URINALYSIS
Bilirubin, UA: NEGATIVE
Glucose, UA: NEGATIVE
Ketones, UA: NEGATIVE
Nitrite, UA: NEGATIVE
Protein,UA: NEGATIVE
Specific Gravity, UA: 1.02 (ref 1.005–1.030)
Urobilinogen, Ur: 0.2 mg/dL (ref 0.2–1.0)
pH, UA: 5.5 (ref 5.0–7.5)

## 2019-08-18 LAB — TYPE AND SCREEN
ABO/RH(D): O POS
Antibody Screen: NEGATIVE

## 2019-08-18 LAB — CBC
HCT: 36.7 % (ref 36.0–46.0)
Hemoglobin: 12.3 g/dL (ref 12.0–15.0)
MCH: 31.6 pg (ref 26.0–34.0)
MCHC: 33.5 g/dL (ref 30.0–36.0)
MCV: 94.3 fL (ref 80.0–100.0)
Platelets: 232 10*3/uL (ref 150–400)
RBC: 3.89 MIL/uL (ref 3.87–5.11)
RDW: 14.3 % (ref 11.5–15.5)
WBC: 9.5 10*3/uL (ref 4.0–10.5)
nRBC: 0 % (ref 0.0–0.2)

## 2019-08-18 LAB — GLUCOSE, CAPILLARY: Glucose-Capillary: 121 mg/dL — ABNORMAL HIGH (ref 70–99)

## 2019-08-18 LAB — RAPID HIV SCREEN (HIV 1/2 AB+AG)
HIV 1/2 Antibodies: NONREACTIVE
HIV-1 P24 Antigen - HIV24: NONREACTIVE

## 2019-08-18 LAB — URINE DRUG SCREEN, QUALITATIVE (ARMC ONLY)
Amphetamines, Ur Screen: NOT DETECTED
Barbiturates, Ur Screen: NOT DETECTED
Benzodiazepine, Ur Scrn: NOT DETECTED
Cannabinoid 50 Ng, Ur ~~LOC~~: NOT DETECTED
Cocaine Metabolite,Ur ~~LOC~~: NOT DETECTED
MDMA (Ecstasy)Ur Screen: NOT DETECTED
Methadone Scn, Ur: NOT DETECTED
Opiate, Ur Screen: NOT DETECTED
Phencyclidine (PCP) Ur S: NOT DETECTED
Tricyclic, Ur Screen: NOT DETECTED

## 2019-08-18 LAB — SARS CORONAVIRUS 2 BY RT PCR (HOSPITAL ORDER, PERFORMED IN ~~LOC~~ HOSPITAL LAB): SARS Coronavirus 2: NEGATIVE

## 2019-08-18 MED ORDER — OXYTOCIN-SODIUM CHLORIDE 30-0.9 UT/500ML-% IV SOLN
INTRAVENOUS | Status: AC
  Administered 2019-08-18: 2 m[IU]/min via INTRAVENOUS
  Filled 2019-08-18: qty 500

## 2019-08-18 MED ORDER — TETANUS-DIPHTH-ACELL PERTUSSIS 5-2.5-18.5 LF-MCG/0.5 IM SUSP
0.5000 mL | Freq: Once | INTRAMUSCULAR | Status: DC
  Filled 2019-08-18: qty 0.5

## 2019-08-18 MED ORDER — OXYTOCIN BOLUS FROM INFUSION
333.0000 mL | Freq: Once | INTRAVENOUS | Status: AC
  Administered 2019-08-18: 333 mL via INTRAVENOUS

## 2019-08-18 MED ORDER — LIDOCAINE HCL (PF) 1 % IJ SOLN
INTRAMUSCULAR | Status: DC | PRN
  Administered 2019-08-18: 1.2 mL via SUBCUTANEOUS

## 2019-08-18 MED ORDER — DIPHENHYDRAMINE HCL 50 MG/ML IJ SOLN: 12.5000 mg | INTRAMUSCULAR | Status: DC | PRN

## 2019-08-18 MED ORDER — LACTATED RINGERS IV SOLN: 500.0000 mL | Freq: Once | INTRAVENOUS | Status: DC

## 2019-08-18 MED ORDER — OXYTOCIN-SODIUM CHLORIDE 30-0.9 UT/500ML-% IV SOLN: 1.0000 m[IU]/min | INTRAVENOUS | Status: DC

## 2019-08-18 MED ORDER — DIPHENHYDRAMINE HCL 25 MG PO CAPS
25.0000 mg | ORAL_CAPSULE | Freq: Four times a day (QID) | ORAL | Status: DC | PRN
  Administered 2019-08-18: 25 mg via ORAL
  Filled 2019-08-18: qty 1

## 2019-08-18 MED ORDER — OXYTOCIN-SODIUM CHLORIDE 30-0.9 UT/500ML-% IV SOLN
2.5000 [IU]/h | INTRAVENOUS | Status: DC
  Administered 2019-08-18: 2.5 [IU]/h via INTRAVENOUS
  Filled 2019-08-18: qty 500

## 2019-08-18 MED ORDER — PHENYLEPHRINE 40 MCG/ML (10ML) SYRINGE FOR IV PUSH (FOR BLOOD PRESSURE SUPPORT)
80.0000 ug | PREFILLED_SYRINGE | INTRAVENOUS | Status: DC | PRN
  Filled 2019-08-18: qty 10

## 2019-08-18 MED ORDER — LIDOCAINE HCL (PF) 1 % IJ SOLN: 30.0000 mL | INTRAMUSCULAR | Status: DC | PRN

## 2019-08-18 MED ORDER — LIDOCAINE HCL (PF) 1 % IJ SOLN
INTRAMUSCULAR | Status: AC
  Filled 2019-08-18: qty 30

## 2019-08-18 MED ORDER — ACETAMINOPHEN 325 MG PO TABS
650.0000 mg | ORAL_TABLET | ORAL | Status: DC | PRN
  Filled 2019-08-18: qty 2

## 2019-08-18 MED ORDER — ACETAMINOPHEN 325 MG PO TABS
650.0000 mg | ORAL_TABLET | ORAL | Status: DC | PRN
  Administered 2019-08-18 – 2019-08-20 (×3): 650 mg via ORAL
  Filled 2019-08-18 (×3): qty 2

## 2019-08-18 MED ORDER — SOD CITRATE-CITRIC ACID 500-334 MG/5ML PO SOLN: 30.0000 mL | ORAL | Status: DC | PRN

## 2019-08-18 MED ORDER — OXYCODONE-ACETAMINOPHEN 5-325 MG PO TABS: 2.0000 | ORAL_TABLET | ORAL | Status: DC | PRN

## 2019-08-18 MED ORDER — POVIDONE-IODINE 10 % EX SWAB: 2.0000 "application " | Freq: Once | CUTANEOUS | Status: DC

## 2019-08-18 MED ORDER — FENTANYL 2.5 MCG/ML W/ROPIVACAINE 0.15% IN NS 100 ML EPIDURAL (ARMC): 12.0000 mL/h | EPIDURAL | Status: DC

## 2019-08-18 MED ORDER — PENICILLIN G POT IN DEXTROSE 60000 UNIT/ML IV SOLN
3.0000 10*6.[IU] | INTRAVENOUS | Status: DC
  Administered 2019-08-18: 3 10*6.[IU] via INTRAVENOUS
  Filled 2019-08-18 (×12): qty 50

## 2019-08-18 MED ORDER — COCONUT OIL OIL: 1.0000 "application " | TOPICAL_OIL | Status: DC | PRN

## 2019-08-18 MED ORDER — OXYCODONE-ACETAMINOPHEN 5-325 MG PO TABS: 1.0000 | ORAL_TABLET | ORAL | Status: DC | PRN

## 2019-08-18 MED ORDER — LACTATED RINGERS IV SOLN: INTRAVENOUS | Status: DC

## 2019-08-18 MED ORDER — EPHEDRINE 5 MG/ML INJ
10.0000 mg | INTRAVENOUS | Status: DC | PRN
  Filled 2019-08-18: qty 2

## 2019-08-18 MED ORDER — DIBUCAINE (PERIANAL) 1 % EX OINT: 1.0000 "application " | TOPICAL_OINTMENT | CUTANEOUS | Status: DC | PRN

## 2019-08-18 MED ORDER — TERBUTALINE SULFATE 1 MG/ML IJ SOLN: 0.2500 mg | Freq: Once | INTRAMUSCULAR | Status: DC | PRN

## 2019-08-18 MED ORDER — ONDANSETRON HCL 4 MG/2ML IJ SOLN: 4.0000 mg | INTRAMUSCULAR | Status: DC | PRN

## 2019-08-18 MED ORDER — WITCH HAZEL-GLYCERIN EX PADS: 1.0000 "application " | MEDICATED_PAD | CUTANEOUS | Status: DC | PRN

## 2019-08-18 MED ORDER — LACTATED RINGERS IV SOLN: 500.0000 mL | INTRAVENOUS | Status: DC | PRN

## 2019-08-18 MED ORDER — PRENATAL MULTIVITAMIN CH
1.0000 | ORAL_TABLET | Freq: Every day | ORAL | Status: DC
  Administered 2019-08-19: 1 via ORAL
  Filled 2019-08-18: qty 1

## 2019-08-18 MED ORDER — AMMONIA AROMATIC IN INHA
RESPIRATORY_TRACT | Status: AC
  Filled 2019-08-18: qty 10

## 2019-08-18 MED ORDER — DOCUSATE SODIUM 100 MG PO CAPS
100.0000 mg | ORAL_CAPSULE | Freq: Two times a day (BID) | ORAL | Status: DC
  Administered 2019-08-18 – 2019-08-19 (×3): 100 mg via ORAL
  Filled 2019-08-18 (×3): qty 1

## 2019-08-18 MED ORDER — FENTANYL 2.5 MCG/ML W/ROPIVACAINE 0.15% IN NS 100 ML EPIDURAL (ARMC)
EPIDURAL | Status: AC
  Filled 2019-08-18: qty 100

## 2019-08-18 MED ORDER — MISOPROSTOL 200 MCG PO TABS
ORAL_TABLET | ORAL | Status: AC
  Filled 2019-08-18: qty 4

## 2019-08-18 MED ORDER — ZOLPIDEM TARTRATE 5 MG PO TABS: 5.0000 mg | ORAL_TABLET | Freq: Every evening | ORAL | Status: DC | PRN

## 2019-08-18 MED ORDER — FENTANYL 2.5 MCG/ML W/ROPIVACAINE 0.15% IN NS 100 ML EPIDURAL (ARMC)
EPIDURAL | Status: DC | PRN
  Administered 2019-08-18: 12 mL/h via EPIDURAL

## 2019-08-18 MED ORDER — IBUPROFEN 600 MG PO TABS
600.0000 mg | ORAL_TABLET | Freq: Four times a day (QID) | ORAL | Status: DC
  Administered 2019-08-18 – 2019-08-20 (×6): 600 mg via ORAL
  Filled 2019-08-18 (×7): qty 1

## 2019-08-18 MED ORDER — CEFAZOLIN SODIUM-DEXTROSE 2-4 GM/100ML-% IV SOLN
2.0000 g | INTRAVENOUS | Status: DC
  Filled 2019-08-18: qty 100

## 2019-08-18 MED ORDER — OXYTOCIN 10 UNIT/ML IJ SOLN
INTRAMUSCULAR | Status: AC
  Filled 2019-08-18: qty 2

## 2019-08-18 MED ORDER — LIDOCAINE-EPINEPHRINE (PF) 1.5 %-1:200000 IJ SOLN
INTRAMUSCULAR | Status: DC | PRN
  Administered 2019-08-18: 3 mL via EPIDURAL

## 2019-08-18 MED ORDER — SODIUM CHLORIDE 0.9 % IV SOLN
5.0000 10*6.[IU] | Freq: Once | INTRAVENOUS | Status: AC
  Administered 2019-08-18: 5 10*6.[IU] via INTRAVENOUS
  Filled 2019-08-18: qty 5

## 2019-08-18 MED ORDER — ONDANSETRON HCL 4 MG PO TABS: 4.0000 mg | ORAL_TABLET | ORAL | Status: DC | PRN

## 2019-08-18 MED ORDER — ONDANSETRON HCL 4 MG/2ML IJ SOLN: 4.0000 mg | Freq: Four times a day (QID) | INTRAMUSCULAR | Status: DC | PRN

## 2019-08-18 MED ORDER — SIMETHICONE 80 MG PO CHEW: 80.0000 mg | CHEWABLE_TABLET | ORAL | Status: DC | PRN

## 2019-08-18 MED ORDER — BENZOCAINE-MENTHOL 20-0.5 % EX AERO
1.0000 "application " | INHALATION_SPRAY | CUTANEOUS | Status: DC | PRN
  Filled 2019-08-18: qty 56

## 2019-08-18 MED ORDER — PRENATAL MULTIVITAMIN CH: 1.0000 | ORAL_TABLET | Freq: Every day | ORAL | Status: DC

## 2019-08-18 NOTE — Anesthesia Procedure Notes (Signed)
Epidural Patient location during procedure: OB Start time: 08/18/2019 2:40 PM End time: 08/18/2019 3:01 PM  Staffing Performed: anesthesiologist   Preanesthetic Checklist Completed: patient identified, IV checked, site marked, risks and benefits discussed, surgical consent, monitors and equipment checked, pre-op evaluation and timeout performed  Epidural Patient position: sitting Prep: Betadine Patient monitoring: heart rate, continuous pulse ox and blood pressure Approach: midline Location: L4-L5 Injection technique: LOR saline  Needle:  Needle type: Tuohy  Needle gauge: 17 G Needle length: 9 cm and 9 Needle insertion depth: 7 cm Catheter type: closed end flexible Catheter size: 20 Guage Catheter at skin depth: 13 cm Test dose: negative and 1.5% lidocaine with Epi 1:200 K  Assessment Events: blood not aspirated, injection not painful, no injection resistance, no paresthesia and negative IV test  Additional Notes   Patient tolerated the insertion well without complications.Reason for block:procedure for pain

## 2019-08-18 NOTE — Progress Notes (Signed)
Erica [name] is a 39 y.o. [id number] at [redacted]w[redacted]d by ultrasound admitted for rupture of membranes  Subjective:  She is now very comfortable with an epidural. Her mother is her bedside and is supportive. Objective: BP (!) 108/57 (BP Location: Right Arm)   Pulse 72   Temp 98.3 F (36.8 C) (Oral)   Resp 18   Ht 5\' 4"  (1.626 m)   Wt 71.7 kg   LMP 01/17/2019   SpO2 98%   BMI 27.12 kg/m  No intake/output data recorded. No intake/output data recorded.  FHT:  FHR: 125 bpm, variability: moderate,  accelerations:  Present,  decelerations:  Absent UC:   irregular, every 2-4 minutes, mild to moderate intensity SVE:   Dilation: 3 Effacement (%): 60 Station: -3 Exam by:: [email or web address]: Lab Results  Component Value Date   WBC 9.5 08/18/2019   HGB 12.3 08/18/2019   HCT 36.7 08/18/2019   MCV 94.3 08/18/2019   PLT 232 08/18/2019    Assessment / Plan: Augmentation of labor, progressing well  Labor: pitocin augmentation ; SROM since 0730 Fetal Wellbeing:  Category I Pain Control:  Epidural I/D:  n/a Anticipated MOD:  NSVD  08/20/2019 08/18/2019, 3:55 PM

## 2019-08-18 NOTE — Anesthesia Preprocedure Evaluation (Signed)
Anesthesia Evaluation  Patient identified by MRN, date of birth, ID band Patient awake    Reviewed: Allergy & Precautions, NPO status , Patient's Chart, lab work & pertinent test results  History of Anesthesia Complications Negative for: history of anesthetic complications  Airway Mallampati: II       Dental   Pulmonary neg sleep apnea, neg COPD, Current Smoker,           Cardiovascular hypertension (gestational), (-) Past MI and (-) CHF (-) dysrhythmias (-) Valvular Problems/Murmurs     Neuro/Psych neg Seizures Anxiety Depression    GI/Hepatic GERD  ,(+)     substance abuse  ,   Endo/Other  neg diabetes  Renal/GU negative Renal ROS     Musculoskeletal   Abdominal   Peds  Hematology   Anesthesia Other Findings   Reproductive/Obstetrics (+) Pregnancy                             Anesthesia Physical Anesthesia Plan  ASA: II  Anesthesia Plan: Epidural   Post-op Pain Management:    Induction:   PONV Risk Score and Plan:   Airway Management Planned:   Additional Equipment:   Intra-op Plan:   Post-operative Plan:   Informed Consent: I have reviewed the patients History and Physical, chart, labs and discussed the procedure including the risks, benefits and alternatives for the proposed anesthesia with the patient or authorized representative who has indicated his/her understanding and acceptance.       Plan Discussed with:   Anesthesia Plan Comments:         Anesthesia Quick Evaluation

## 2019-08-18 NOTE — Progress Notes (Signed)
STEPHENIE [name] is a 39 y.o. [id number] at [redacted]w[redacted]d by ultrasound admitted for rupture of membranes. TOLAC with plans for PP BTL. Higher risk pregnancy (late to care, missed appointments,  Subjective:  She is sitting up in the bed. Reports the contractions are starting to be painful. Continues leaking clear fluid. She would like to eat. She also wants an epidural once the labor is more painful.  Objective: BP (!) 118/64 (BP Location: Left Arm)   Pulse 75   Temp 97.7 F (36.5 C) (Oral)   Resp 16   Ht 5\' 4"  (1.626 m)   Wt 71.7 kg   LMP 01/17/2019   BMI 27.12 kg/m  No intake/output data recorded. No intake/output data recorded.  FHT:  FHR: 130 bpm, variability: moderate,  accelerations:  Present,  decelerations:  Absent UC:   irregular, every 2-5 minutes SVE:   Dilation: 3 Effacement (%): 50 Station: Ballotable Exam by:: [email or web address] CNM  Labs: Lab Results  Component Value Date   WBC 9.5 08/18/2019   HGB 12.3 08/18/2019   HCT 36.7 08/18/2019   MCV 94.3 08/18/2019   PLT 232 08/18/2019    Assessment / Plan: Spontaneous labor, progressing normally with SROM 3.5 hours  Labor: Progressing normally  Fetal Wellbeing:  Category I Pain Control:  Epidural planned I/D:  n/a Anticipated MOD:  NSVD  PCN for GBS+ status. Awaiting UDS results Will plan on social work consult postpartarlly  08/20/2019 08/18/2019, 11:05 AM

## 2019-08-18 NOTE — Discharge Summary (Signed)
Postpartum Discharge Summary  Date of Service updated 08/20/2019     Patient Name: Erica [name] DOB: 1980-05-26 MRN: [id number]  Date of admission: 08/18/2019 Delivery date:08/18/2019  Delivering provider: Imagene Riches  Date of discharge: 08/20/2019  Admitting diagnosis: Term pregnancy [Z34.90], onset of labor Intrauterine pregnancy: [redacted]w[redacted]d    Secondary diagnosis:  Previous Cesarean section, grand multipara, AMA Additional problems: GBS positive, inadequate prenatal care, tobacco use in pregnancy, History of substance use    Discharge diagnosis: Vaginal birth after Cesarean section                                             Post partum procedures:none Augmentation: Pitocin Complications: None  Hospital course: Onset of Labor With Vaginal Delivery      39y.o. yo [id number] 38w4das admitted in Latent Labor on 08/18/2019. Patient had an uncomplicated labor course as follows:  Membrane Rupture Time/Date: 7:30 AM ,08/18/2019   Delivery Method:VBAC, Spontaneous  Episiotomy: None  Lacerations:  None  Patient had an uncomplicated postpartum course.  She is ambulating, tolerating a regular diet, passing flatus, and urinating well. Patient is discharged home in stable condition on 08/20/19.  Newborn Data: Birth date:08/18/2019  Birth time:4:28 PM  Gender:Female Kaitlyn Living status:Living  Apgars:8 ,9  Weight:2380 g   Magnesium Sulfate received: No BMZ received: No Rhophylac:N/A MMR:No T-DaP: last dose 2019 Flu: No Transfusion:No  Physical exam  Vitals:   08/19/19 1621 08/19/19 2001 08/19/19 2347 08/20/19 0845  BP: 115/75 114/74 (!) 93/60 117/67  Pulse: 51 78 51 71  Resp: '18 20 20 18  ' Temp: 98.2 F (36.8 C) 98.2 F (36.8 C) 98.4 F (36.9 C) 98 F (36.7 C)  TempSrc: Oral Oral Oral Oral  SpO2: 100% 100% 100% 100%  Weight:      Height:       General: alert, cooperative and no distress Lochia: appropriate Uterine Fundus: firm Incision: N/A DVT Evaluation:  No evidence of DVT seen on physical exam. Labs: Lab Results  Component Value Date   WBC 10.8 (H) 08/19/2019   HGB 10.8 (L) 08/19/2019   HCT 32.2 (L) 08/19/2019   MCV 95.5 08/19/2019   PLT 206 08/19/2019   CMP Latest Ref Rng & Units 03/23/2019  Glucose 70 - 99 mg/dL -  BUN 6 - 20 mg/dL 8  Creatinine 0.57 - 1.00 mg/dL 0.51(L)  Sodium 135 - 145 mmol/L -  Potassium 3.5 - 5.1 mmol/L -  Chloride 98 - 111 mmol/L -  CO2 22 - 32 mmol/L -  Calcium 8.9 - 10.3 mg/dL -  Total Protein 6.5 - 8.1 g/dL -  Total Bilirubin 0.3 - 1.2 mg/dL -  Alkaline Phos 38 - 126 U/L -  AST 0 - 40 IU/L 24  ALT 0 - 44 U/L -   Edinburgh Score: Edinburgh Postnatal Depression Scale Screening Tool 08/19/2019  I have been able to laugh and see the funny side of things. 0  I have looked forward with enjoyment to things. 0  I have blamed myself unnecessarily when things went wrong. 1  I have been anxious or worried for no good reason. 1  I have felt scared or panicky for no good reason. 0  Things have been getting on top of me. 1  I have been so unhappy that I have had difficulty sleeping. 0  I have felt sad or miserable. 1  I have been so unhappy that I have been crying. 0  The thought of harming myself has occurred to me. 0  Edinburgh Postnatal Depression Scale Total 4      After visit meds:  Allergies as of 08/20/2019      Reactions   Levaquin [levofloxacin In D5w] Swelling   Lip swelling, hives, chest tightness      Medication List    TAKE these medications   acetaminophen 325 MG tablet Commonly known as: Tylenol Take 2 tablets (650 mg total) by mouth every 4 (four) hours as needed for mild pain or headache (for pain scale < 4).   ibuprofen 600 MG tablet Commonly known as: ADVIL Take 1 tablet (600 mg total) by mouth every 6 (six) hours as needed for mild pain, moderate pain or cramping.   Prenatal Vitamin 27-0.8 MG Tabs Take 1 tablet by mouth daily.        Discharge home in stable  condition Infant Feeding: Bottle Infant Disposition:home with mother Discharge instruction: per After Visit Summary and Postpartum booklet. Activity: Advance as tolerated. Pelvic rest for 6 weeks.  Diet: routine diet Anticipated Birth Control: interval BTL with Depo Provera bridge Postpartum Appointment:2 weeks at [facility] for depression check Additional Postpartum F/U: 4 weeks with DR Gilman Schmidt to discuss/schedule lap BTL Future Appointments: No future appointments. Follow up Visit:      08/20/2019 Dalia Heading, CNM

## 2019-08-18 NOTE — H&P (Signed)
Erica [name] is a 39 y.o. female presenting for suspected SROM and mild contractions since 0730 thismorning She reports waking up to a pool of fluid under her. Continues leaking fluid.has not eaten this morning. Expresses feeling very anxious and afraind of the pain of labor. Plans anepidural and a PP BTL consents were signed ath the health Department. OB History    Gravida  10   Para  6   Term  4   Preterm  2   AB  3   Living  6     SAB  2   TAB  0   Ectopic  1   Multiple  0   Live Births  6          Past Medical History:  Diagnosis Date  . Abnormal Pap smear   . Anemia   . Anxiety    as teen  . Anxiety   . Depression    as teen  . GERD (gastroesophageal reflux disease)   . Headache(784.0)   . No pertinent past medical history   . Pneumonia   . Pregnancy induced hypertension   . Preterm labor   . Vaginal Pap smear, abnormal   . VBAC, delivered, current hospitalization 05/19/2011   Past Surgical History:  Procedure Laterality Date  . APPENDECTOMY    . CESAREAN SECTION    . COLPOSCOPY    . DILATION AND CURETTAGE OF UTERUS  2010  . LAPAROSCOPIC APPENDECTOMY N/A 07/11/2016   Procedure: APPENDECTOMY LAPAROSCOPIC;  Surgeon: Ricarda Frame, MD;  Location: [facility] ORS;  Service: General;  Laterality: N/A;  . WISDOM TOOTH EXTRACTION     Family History: family history includes Arthritis in her maternal grandmother; Birth defects in her daughter; Cancer in her maternal grandmother; Hypertension in her maternal grandmother and mother; Mental retardation in her cousin; Seizures in her brother, brother, cousin, cousin, maternal aunt, and mother; Stroke in her maternal grandfather. Social History:  reports that she has been smoking cigarettes. She has a 3.25 pack-year smoking history. She has never used smokeless tobacco. She reports previous alcohol use. She reports previous drug use. Drug: Cocaine.     Maternal Diabetes: No Genetic Screening: Declined Maternal  Ultrasounds/Referrals: Normal Fetal Ultrasounds or other Referrals:  None Maternal Substance Abuse:  Yes:  Type: Smoker, Marijuana, Cocaine Significant Maternal Medications:  None Significant Maternal Lab Results:  Group B Strep positive Other Comments:  Recent UDS was negative for MJ, cocaine.  Review of Systems  Constitutional: Negative.   HENT: Positive for dental problem.   Eyes: Negative.   Respiratory: Negative.   Cardiovascular: Negative.   Gastrointestinal: Negative.   Endocrine: Negative.   Genitourinary: Negative.   Allergic/Immunologic: Negative.   Neurological: Negative.   Hematological: Negative.   Psychiatric/Behavioral: The patient is nervous/anxious.    History Dilation: 3 Effacement (%): 50 Station: Ballotable Exam by:: Mirna Mires CNM Blood pressure (!) 118/64, pulse 75, temperature 97.7 F (36.5 C), temperature source Oral, resp. rate 16, height 5\' 4"  (1.626 m), weight 71.7 kg, last menstrual period 01/17/2019, unknown if currently breastfeeding. Exam Physical Exam  Prenatal labs: ABO, Rh: --/--/O POS (07/13 0011) Antibody: NEG (07/13 0011) Rubella: 1.10 (02/26 1617) RPR: NON REACTIVE (07/13 0010)  HBsAg: Negative (02/26 1617)  HIV: NON REACTIVE (07/13 0015)  GBS: Positive/-- (07/15 1620)   Assessment/Plan: Admit to [place]. EFM continuous Routine labs and UDS May have epidural once contracting regularly. Dr. 05-15-1992 aware.   Jerene Pitch 08/18/2019, 8:49 AM

## 2019-08-19 ENCOUNTER — Encounter (HOSPITAL_COMMUNITY): Payer: Self-pay | Admitting: Certified Registered"

## 2019-08-19 ENCOUNTER — Encounter
Admission: EM | Disposition: A | Discharge status: Home / Self Care | Payer: Self-pay | Source: Home / Self Care | Attending: Obstetrics and Gynecology

## 2019-08-19 LAB — CBC
HCT: 32.2 % — ABNORMAL LOW (ref 36.0–46.0)
Hemoglobin: 10.8 g/dL — ABNORMAL LOW (ref 12.0–15.0)
MCH: 32 pg (ref 26.0–34.0)
MCHC: 33.5 g/dL (ref 30.0–36.0)
MCV: 95.5 fL (ref 80.0–100.0)
Platelets: 206 10*3/uL (ref 150–400)
RBC: 3.37 MIL/uL — ABNORMAL LOW (ref 3.87–5.11)
RDW: 14.6 % (ref 11.5–15.5)
WBC: 10.8 10*3/uL — ABNORMAL HIGH (ref 4.0–10.5)
nRBC: 0 % (ref 0.0–0.2)

## 2019-08-19 LAB — RPR: RPR Ser Ql: NONREACTIVE

## 2019-08-19 SURGERY — LIGATION, FALLOPIAN TUBE, POSTPARTUM
Anesthesia: General

## 2019-08-19 SURGICAL SUPPLY — 29 items
ADH SKN CLS APL DERMABOND .7 (GAUZE/BANDAGES/DRESSINGS) ×1
APL PRP STRL LF DISP 70% ISPRP (MISCELLANEOUS) ×1
BLADE SURG SZ11 CARB STEEL (BLADE) ×4 IMPLANT
CHLORAPREP W/TINT 26 (MISCELLANEOUS) ×4 IMPLANT
DERMABOND ADVANCED (GAUZE/BANDAGES/DRESSINGS) ×2
DERMABOND ADVANCED .7 DNX12 (GAUZE/BANDAGES/DRESSINGS) ×2 IMPLANT
DRAPE LAPAROTOMY 100X77 ABD (DRAPES) ×7 IMPLANT
ELECT CAUTERY BLADE 6.4 (BLADE) ×4 IMPLANT
ELECT REM PT RETURN 9FT ADLT (ELECTROSURGICAL) ×3
ELECTRODE REM PT RTRN 9FT ADLT (ELECTROSURGICAL) ×2 IMPLANT
GLOVE BIOGEL PI IND STRL 6.5 (GLOVE) ×4 IMPLANT
GLOVE BIOGEL PI INDICATOR 6.5 (GLOVE) ×4
GLOVE SURG SYN 6.5 ES PF (GLOVE) ×6 IMPLANT
GLOVE SURG SYN 6.5 PF PI (GLOVE) ×2 IMPLANT
GOWN STRL REUS W/ TWL LRG LVL3 (GOWN DISPOSABLE) ×4 IMPLANT
GOWN STRL REUS W/TWL LRG LVL3 (GOWN DISPOSABLE) ×6
LABEL OR SOLS (LABEL) ×4 IMPLANT
NEEDLE HYPO 22GX1.5 SAFETY (NEEDLE) ×4 IMPLANT
NS IRRIG 500ML POUR BTL (IV SOLUTION) ×4 IMPLANT
PACK BASIN MINOR (MISCELLANEOUS) ×4 IMPLANT
RETRACTOR RING XSMALL (MISCELLANEOUS) ×2 IMPLANT
RTRCTR WOUND ALEXIS 13CM XS SH (MISCELLANEOUS) ×3
SPONGE LAP 4X18 RFD (DISPOSABLE) ×4 IMPLANT
SUT CHROMIC 0 CT 1 (SUTURE) ×8 IMPLANT
SUT MNCRL 4-0 (SUTURE) ×3
SUT MNCRL 4-0 27XMFL (SUTURE) ×1
SUT VICRYL 0 AB UR-6 (SUTURE) ×4 IMPLANT
SUTURE MNCRL 4-0 27XMF (SUTURE) ×2 IMPLANT
SYR 10ML LL (SYRINGE) ×4 IMPLANT

## 2019-08-19 NOTE — Progress Notes (Signed)
Pt. states she does not want a Tubal Ligation to prevent future pregnancy's. Pt. States," I'm bored and I just want to go home."

## 2019-08-19 NOTE — Lactation Note (Addendum)
This note was copied from a baby's chart. Lactation Consultation Note  Patient Name: Erica [name] [id number] Date: 08/19/2019   Mom's feeding plan is to bottle feed formula.  Hand out given on Infant Formula Preparation and reviewed storage, preparation, sanitizing workspace, and feeding and preparation equipment, mixing with water and protecting from cronobacter.  Discussed methods to dry up milk.  Mom reports breasts already feel fuller.  Discussed wearing well fitting supportive bra, cold, no warmth or stimulation and cabbage leaves to suppress  Discussed differences and treatment of full breasts, engorgement, plugged ducts and mastitis and when to seek further assistance or follow up from physician.  Maternal Data    Feeding Feeding Type: Bottle Fed - Formula Nipple Type: Slow - flow  LATCH Score                   Interventions    Lactation Tools Discussed/Used     Consult Status      Louis Meckel 08/19/2019, 3:13 PM

## 2019-08-19 NOTE — Anesthesia Postprocedure Evaluation (Signed)
Anesthesia Post Note  Patient: Erica [name]  Procedure(s) Performed: AN AD HOC LABOR EPIDURAL  Patient location during evaluation: Mother Baby Anesthesia Type: Epidural Level of consciousness: awake and alert Pain management: pain level controlled Vital Signs Assessment: post-procedure vital signs reviewed and stable Respiratory status: spontaneous breathing, nonlabored ventilation and respiratory function stable Cardiovascular status: stable Postop Assessment: no headache, no backache and epidural receding Anesthetic complications: no   No complications documented.   Last Vitals:  Vitals:   08/19/19 0359 08/19/19 0754  BP: 106/70 105/79  Pulse: 63 68  Resp: 18 20  Temp: 36.8 C 36.9 C  SpO2: 99% 100%    Last Pain:  Vitals:   08/19/19 0359  TempSrc: Oral  PainSc:                  Moishy Laday K

## 2019-08-19 NOTE — Progress Notes (Signed)
Provided peanut butter and crackers as requested per pt. No other needs at this time

## 2019-08-19 NOTE — Progress Notes (Signed)
Removed all snacks and beverages form pt table except for bag of peanut butter and crackers. Moved that bag to sink counter and instructed pt not to eat or drink anything else and that I will be bringing her a carbohydrate drink for her to drink when I come in to start her fluids. Pt verbalized understanding. No other needs expressed

## 2019-08-19 NOTE — Progress Notes (Signed)
During pre-op check, pt reports she had eaten a [place] sandwich tray and cereal at around 5 am this morning prior to drinking "carb drink". This contradicts the report given by offgoing RN from previous shift. Pt reports, 'they said it would be ok, I don't know why". Reported pt statement to MD and anesthesia. Offered pt to reschedule for later in the day, pt refused stating, "no way, I'm already starving!". Pt then asked if surgery could be rescheduled for tomorrow. This RN discussed with patient that I would need to contact the Dr on for the next day as the current Dr is not here tomorrow. Can check with OR charge nurse for scheduling purposes. Pt stated she "will think about it".  Spoke with OR charge nurse Baird Lyons) regarding availability. She will put in a note and await confirmation. Will reassess later this shift.

## 2019-08-19 NOTE — Progress Notes (Signed)
Provided pt with sandwich tray and pt already had beverage. No other needs expressed at this time.

## 2019-08-19 NOTE — Progress Notes (Signed)
Started iv fluids and gave pt carbohydrate drink as ordered per anesthesia order.

## 2019-08-19 NOTE — Interval H&P Note (Signed)
History and Physical Interval Note:  08/19/2019 9:43 AM  Erica [name]  has presented today for surgery, with the diagnosis of sterilization.  The various methods of treatment have been discussed with the patient and family. After consideration of risks, benefits and other options for treatment, the patient has consented to  Procedure(s): POST PARTUM TUBAL LIGATION (N/A) as a surgical intervention.  The patient's history has been reviewed, patient examined, no change in status, stable for surgery.  I have reviewed the patient's chart and labs.  Questions were answered to the patient's satisfaction.     [name]

## 2019-08-19 NOTE — Plan of Care (Signed)
Alert and oriented sl. Agitated affect. States she would like a cigarette(smokes when at home). I told Pt. I could call her Provider and obtain an order for Nicotine Patch and she refuses.  Good eye contact, states she is anxious to get home. Expresses desire to take care of Infant. Demonstrates appropriate PP care for self.

## 2019-08-19 NOTE — Progress Notes (Signed)
Pt very upset, pacing back and forth, infant crying. Pt anxious, stating 'I feel so lost, I don't know what to do, she wont stop crying, I dont know what she wants". Offered emotional support to patient, she became tearful. Sat with pt and offered soothing mechanisms for baby, discussed feeding patterns. Pt stated, "I keep getting mixed messages, I'm not sure what to do with her, she seems hungry but I just fed her". Assessed baby, not showing hunger cues, but very fussy. Demonstrated swaddling infant, soothing, rocking, offered pacifier (in bassinet). Infant gassy, passed gas several times during swaddling and rocking. Infant initially calmed, but then became fussy when placed back in bassinet.  Mom stated, " I have to get out of here, its too hot in here, I cant take all this, its too much. I'm not going to hurt my baby, I just need a minute".  This RN offered to take infant to nursery or stay in room and complete infant screenings while mom goes for a walk to calm down. Mom appreciative, RN removed IV saline lock and pt went downstairs for a break. Pt stayed out for approximately 30 mins, then returned to room. Infant screenings completed, mom updated on results. Mom seemed initially relieved, but became upset again when infant began fussing. RN offered emotional support, mom stated, "i'll be fine, we'll figure this out'. RN left door to room open to monitor for intervention if needed.

## 2019-08-20 DIAGNOSIS — O34219 Maternal care for unspecified type scar from previous cesarean delivery: Secondary | ICD-10-CM

## 2019-08-20 MED ORDER — IBUPROFEN 600 MG PO TABS: 600.0000 mg | ORAL_TABLET | Freq: Four times a day (QID) | ORAL | 1 refills | Status: DC | PRN

## 2019-08-20 MED ORDER — ACETAMINOPHEN 325 MG PO TABS: 650.0000 mg | ORAL_TABLET | ORAL | 0 refills | Status: DC | PRN

## 2019-08-20 MED ORDER — MEDROXYPROGESTERONE ACETATE 150 MG/ML IM SUSP
150.0000 mg | Freq: Once | INTRAMUSCULAR | Status: AC
  Administered 2019-08-20: 150 mg via INTRAMUSCULAR
  Filled 2019-08-20: qty 1

## 2019-08-20 NOTE — Clinical Social Work Maternal (Signed)
  CLINICAL SOCIAL WORK MATERNAL/CHILD NOTE  Patient Details  Name: Erica [name] MRN: [id number] Date of Birth: [birth year]  Date:  08/20/2019  Clinical Social Worker Initiating Note:  Thea Gist, RNCM Date/Time: Initiated:  08/20/19/1000     Child's Name:  Erica [name]   Biological Parents:  Mother   Need for Interpreter:  None   Reason for Referral:  Other (Comment) (Previous substance abuse)   Address:  [address] [place] [address]    Phone number:  [phone] (home)     Additional phone number:   Household Members/Support Persons (HM/SP):   Household Member/Support Person 1   HM/SP Name Relationship DOB or Age  HM/SP -1 Manus Rudd mother    HM/SP -2        HM/SP -3        HM/SP -4        HM/SP -5        HM/SP -6        HM/SP -7        HM/SP -8          Natural Supports (not living in the home):  Children   Professional Supports:     Employment: Unemployed   Type of Work:     Education:  9 to 11 years   Homebound arranged:    Surveyor, quantity Resources:  Medicaid   Other Resources:  [facility]   Cultural/Religious Considerations Which May Impact Care:    Strengths:  Ability to meet basic needs , Home prepared for child    Psychotropic Medications:         Pediatrician:       Pediatrician List:   [employer]    [place]    [facility]      Pediatrician Fax Number:    Risk Factors/Current Problems:      Cognitive State:      Mood/Affect:  Anxious    CSW Assessment: RNCM assessed MOB at bedside. Mother of MOB and sister present as well. MOB up completing shower and reports that she is ready to go home. MOB lives with her mother and her 67 and 4 year old children. She reports that she has other children but they are grown and do not live in the home. MOB reports that she does not currently work but is planning on going to work as soon as she can. She reports that her  mother is her main support and the father of the baby is not involved in the care. MOB reports that she does get [facility] assistance and plans to call them to let them know that the baby has been born. MOB reports that she does not take any medications she has not taken any illicit drugs since April. MOB denies any needs at this time and reports she has all necessary items at home to care for the baby.   CSW Plan/Description:  No Further Intervention Required/No Barriers to Discharge    Trenton Founds, RN 08/20/2019, 10:51 AM

## 2019-08-20 NOTE — Anesthesia Postprocedure Evaluation (Signed)
Anesthesia Post Note  Patient: Erica [name]  Procedure(s) Performed: POST PARTUM TUBAL LIGATION (N/A Abdomen)  Patient location during evaluation: Mother Baby Anesthesia Type: Epidural Level of consciousness: awake, awake and alert and oriented Pain management: pain level controlled Vital Signs Assessment: post-procedure vital signs reviewed and stable Respiratory status: spontaneous breathing, nonlabored ventilation and respiratory function stable Cardiovascular status: stable Postop Assessment: no headache, no backache, able to ambulate, adequate PO intake, no apparent nausea or vomiting and patient able to bend at knees Anesthetic complications: no   No complications documented.   Last Vitals:  Vitals:   08/19/19 2001 08/19/19 2347  BP: 114/74 (!) 93/60  Pulse: 78 51  Resp: 20 20  Temp: 36.8 C 36.9 C  SpO2: 100% 100%    Last Pain:  Vitals:   08/20/19 0259  TempSrc:   PainSc: Asleep                 Tecora Eustache,  Sheran Fava

## 2019-08-20 NOTE — Discharge Instructions (Signed)
Call for Temp. Greater than 100.4  No Tampons, Douches or Sex for 6 weeks. No tub bath for 6 weeks. Go to E.R. For any Shortness of Breath, chest Pain or saturating one or more pads in one hour and for any clots larger than a small lemon. Feelings of depression that make you feel unable to take care of yourself and/or baby. Do not lift anything heavier than your baby. No driving for 2 weeks and/or while you are taking Narcotic medication.     Vaginal Delivery, Care After Refer to this sheet in the next few weeks. These discharge instructions provide you with information on caring for yourself after delivery. Your caregiver may also give you specific instructions. Your treatment has been planned according to the most current medical practices available, but problems sometimes occur. Call your caregiver if you have any problems or questions after you go home. HOME CARE INSTRUCTIONS 1. Take over-the-counter or prescription medicines only as directed by your caregiver or pharmacist. 2. Do not drink alcohol, especially if you are breastfeeding or taking medicine to relieve pain. 3. Do not smoke tobacco. 4. Continue to use good perineal care. Good perineal care includes: 1. Wiping your perineum from back to front 2. Keeping your perineum clean. 3. You can do sitz baths twice a day, to help keep this area clean 5. Do not use tampons, douche or have sex for 6 weeks 6. Shower only and avoid sitting in submerged water, aside from sitz baths 7. Wear a well-fitting bra that provides breast support. 8. Eat healthy foods. 9. Drink enough fluids to keep your urine clear or pale yellow. 10. Eat high-fiber foods such as whole grain cereals and breads, brown rice, beans, and fresh fruits and vegetables every day. These foods may help prevent or relieve constipation. 11. Avoid constipation with high fiber foods or medications, such as miralax or metamucil 12. Follow your caregiver's recommendations regarding  resumption of activities such as climbing stairs, driving, lifting, exercising, or traveling. 13. Talk to your caregiver about resuming sexual activities. Resumption of sexual activities after 6 weeks is dependent upon your risk of infection, your rate of healing, and your comfort and desire to resume sexual activity. 14. Try to have someone help you with your household activities and your newborn for at least a few days after you leave the hospital. 15. Rest as much as possible. Try to rest or take a nap when your newborn is sleeping. 16. Increase your activities gradually. 17. Keep all of your scheduled postpartum appointments. It is very important to keep your scheduled follow-up appointments. At these appointments, your caregiver will be checking to make sure that you are healing physically and emotionally. SEEK MEDICAL CARE IF:   You are passing large clots from your vagina. Save any clots to show your caregiver.  You have a foul smelling discharge from your vagina.  You have trouble urinating.  You are urinating frequently.  You have pain when you urinate.  You have a change in your bowel movements.  You have increasing redness, pain, or swelling near your vaginal incision (episiotomy) or vaginal tear.  You have pus draining from your episiotomy or vaginal tear.  Your episiotomy or vaginal tear is separating.  You have painful, hard, or reddened breasts.  You have a severe headache.  You have blurred vision or see spots.  You feel sad or depressed.  You have thoughts of hurting yourself or your newborn.  You have questions about your care, the  care of your newborn, or medicines.  You are dizzy or light-headed.  You have a rash.  You have nausea or vomiting.  You were breastfeeding and have not had a menstrual period within 12 weeks after you stopped breastfeeding.  You are not breastfeeding and have not had a menstrual period by the 12th week after delivery.  You  have a fever of 100.5 or more SEEK IMMEDIATE MEDICAL CARE IF:   You have persistent pain.  You have chest pain.  You have shortness of breath.  You faint.  You have leg pain.  You have stomach pain.  Your vaginal bleeding saturates two or more sanitary pads in 1 hour. MAKE SURE YOU:   Understand these instructions.  Will watch your condition.  Will get help right away if you are not doing well or get worse. Document Released: 01/09/2000 Document Revised: 05/28/2013 Document Reviewed: 09/08/2011 [facility] Patient Information 2015 [place], [place]. This information is not intended to replace advice given to you by your health care provider. Make sure you discuss any questions you have with your health care provider.  Sitz Bath A sitz bath is a warm water bath taken in the sitting position. The water covers only the hips and butt (buttocks). We recommend using one that fits in the toilet, to help with ease of use and cleanliness. It may be used for either healing or cleaning purposes. Sitz baths are also used to relieve pain, itching, or muscle tightening (spasms). The water may contain medicine. Moist heat will help you heal and relax.  HOME CARE  Take 3 to 4 sitz baths a day. 18. Fill the bathtub half-full with warm water. 19. Sit in the water and open the drain a little. 20. Turn on the warm water to keep the tub half-full. Keep the water running constantly. 21. Soak in the water for 15 to 20 minutes. 22. After the sitz bath, pat the affected area dry. GET HELP RIGHT AWAY IF: You get worse instead of better. Stop the sitz baths if you get worse. MAKE SURE YOU:  Understand these instructions.  Will watch your condition.  Will get help right away if you are not doing well or get worse. Document Released: 02/19/2004 Document Revised: 10/06/2011 Document Reviewed: 05/11/2010 [facility] Patient Information 2015 [place], [place]. This information is not intended to replace advice  given to you by your health care provider. Make sure you discuss any questions you have with your health care provider.

## 2019-08-20 NOTE — Progress Notes (Signed)
Post Partum Day 2 Subjective: up ad lib, voiding and tolerating PO.   Objective: Blood pressure 117/67, pulse 71, temperature 98 F (36.7 C), temperature source Oral, resp. rate 18, height 5\' 4"  (1.626 m), weight 71.7 kg, last menstrual period 01/17/2019, SpO2 100 %, unknown if currently breastfeeding.  Physical Exam:  General: alert, cooperative and no distress Lochia: appropriate Uterine Fundus: firm/ at U/ML/NT Incision: NA DVT Evaluation: No evidence of DVT seen on physical exam.  Recent Labs    08/18/19 0918 08/19/19 0637  HGB 12.3 10.8*  HCT 36.7 32.2*  WBC 9.5 10.8*  PLT 232 206    Assessment/Plan: PPD #2-stable Ready for discharge Bottle O POS/ RI/VI TDAP-last one 2019 Contraception: desires interval tubal and Depo bridge   LOS: 2 days   2020 08/20/2019, 11:04 AM

## 2019-08-20 NOTE — Progress Notes (Signed)
DC to home to car via auxillary.Marland Kitchen

## 2019-08-20 NOTE — Progress Notes (Signed)
DC inst reveiwed with pt.  Verb u/o of f/u care for self.  Depo given and verb u/o of repeating dose in 3 mos.

## 2019-08-21 ENCOUNTER — Other Ambulatory Visit: Payer: Self-pay | Admitting: Family Medicine

## 2019-08-21 DIAGNOSIS — Z302 Encounter for sterilization: Secondary | ICD-10-CM

## 2019-08-22 ENCOUNTER — Telehealth: Payer: Self-pay | Admitting: Obstetrics and Gynecology

## 2019-08-22 NOTE — Telephone Encounter (Signed)
[facility] referring for Encounter for sterilization/ btl .To schedule a four week follow with Dr. Jerene Pitch. Called and left voicemail for patient to call back to be scheduled.Papers signed on 08/21/19.

## 2019-08-23 NOTE — Telephone Encounter (Signed)
Called and left voicemail for patient to call back to be scheduled. 

## 2019-08-24 ENCOUNTER — Ambulatory Visit: Payer: Medicaid Other

## 2019-08-30 ENCOUNTER — Telehealth: Payer: Self-pay | Admitting: Licensed Clinical Social Worker

## 2019-08-30 NOTE — Telephone Encounter (Signed)
-----   Message from [name], [place] sent at 08/20/2019 10:43 AM EDT ----- Regarding: 08/18/2019 Postpartum mood check. Delivered on 08/18/2019

## 2019-08-30 NOTE — Telephone Encounter (Signed)
TELEHEALTH VIRTUAL MOOD VISIT ENCOUNTER NOTE  I connected with Erica [name] on 08/30/19 by telephone and verified that I am speaking with the correct person using two identifiers.   I discussed the limitations, risks, security and privacy concerns of performing an evaluation and management service by telephone and the availability of in person appointments. I also discussed with the patient that there may be a patient responsible charge related to this service. The patient expressed understanding and agreed to proceed.  LCSW introduced self and established psychological safety.  Patient verbally consented to this telephonic session.   Time visit started: 12:13 Time visit ended: 12:02  SUBJECTIVE: Provider recommended a 2 week mood check due to risk factors for postpartum mood disorder.  Patient delivered on 08/18/2019  ASSESSMENT: Patient currently denies any depressive symptoms, she does endorse mild anxiety symptoms - some worries about  baby when she is sleeping, and worries about child care options. Patient reports that baby has appointment tomorrow and she will check with provider about her concerns. She also reports knowing about child care resources and plans to talk with family and apply for child care with DSS. Patient reports that she is able to sleep when baby sleeps and deneis appetite issues.   PRESENTING CONCERNS: Patient and/or family reports the following symptoms/concerns: No concerns reported.   STRENGTHS (Protective Factors/Coping Skills): Patient reports that she has a good support system with her mom and others. Patient also reports that she has had 7 children and has never had postpartum depressions or anxiety issues.    INTERVENTIONS: Interventions utilized:  Psychoeducation and/or Health Education Standardized Assessments completed: NA  . Conducted brief assessment  . Provide brief psychoeducation on postpartum mood and anxiety disorders signs and symptoms,  including information on Baby Blues, Postpartum Depression, and Postpartum Anxiety.   . Discussed self-care strategies to prevent and/or reduce symptoms of postpartum mood and anxiety disorders, including sleep hygiene, eating regularly, drinking fluids, getting outside, and support from partner, family, and/or friends.   Informed patient to call her provider right away or get emergency help if she experiences any of the following symptoms: . Feelings of hopelessness and total despair. . Feeling out of touch with reality (hearing or seeing things other people don't). . Feeling that you might hurt yourself or your baby.  PLAN: LCSW encouraged patient to follow up as needed.   Erica [name]

## 2019-09-13 ENCOUNTER — Ambulatory Visit: Payer: Medicaid Other

## 2019-09-18 ENCOUNTER — Other Ambulatory Visit: Payer: Medicaid Other

## 2019-09-18 ENCOUNTER — Other Ambulatory Visit: Payer: Self-pay

## 2019-09-18 ENCOUNTER — Ambulatory Visit (LOCAL_COMMUNITY_HEALTH_CENTER): Payer: Medicaid Other

## 2019-09-18 DIAGNOSIS — Z111 Encounter for screening for respiratory tuberculosis: Secondary | ICD-10-CM

## 2019-09-18 NOTE — Addendum Note (Signed)
Addended byHart Carwin on: 09/18/2019 11:37 AM   Modules accepted: Level of Service

## 2019-09-21 ENCOUNTER — Ambulatory Visit: Payer: Medicaid Other | Admitting: Obstetrics and Gynecology

## 2019-09-21 ENCOUNTER — Other Ambulatory Visit: Payer: Self-pay

## 2019-09-21 ENCOUNTER — Ambulatory Visit (LOCAL_COMMUNITY_HEALTH_CENTER): Payer: Medicaid Other

## 2019-09-21 DIAGNOSIS — Z111 Encounter for screening for respiratory tuberculosis: Secondary | ICD-10-CM

## 2019-09-21 LAB — TB SKIN TEST
Induration: 0 mm
TB Skin Test: NEGATIVE

## 2019-10-03 ENCOUNTER — Ambulatory Visit: Payer: Medicaid Other

## 2019-10-11 ENCOUNTER — Telehealth: Payer: Self-pay

## 2019-10-11 NOTE — Telephone Encounter (Addendum)
TC to patient to try to reschedule PP visit. "Call rejected". Unable to LM.Marland Kitchen Left message for patient emergency contact to ask patient to call [facility], number given.Burt Knack, RN

## 2019-10-25 NOTE — Telephone Encounter (Signed)
Per Epic, client has rescheduled post-partum appt for 11/01/2019.

## 2019-11-01 ENCOUNTER — Encounter: Payer: Self-pay | Admitting: Physician Assistant

## 2019-11-01 ENCOUNTER — Ambulatory Visit: Payer: Medicaid Other | Admitting: Physician Assistant

## 2019-11-01 ENCOUNTER — Other Ambulatory Visit: Payer: Self-pay

## 2019-11-01 DIAGNOSIS — Z3009 Encounter for other general counseling and advice on contraception: Secondary | ICD-10-CM | POA: Diagnosis not present

## 2019-11-01 DIAGNOSIS — Z720 Tobacco use: Secondary | ICD-10-CM

## 2019-11-01 DIAGNOSIS — Z30013 Encounter for initial prescription of injectable contraceptive: Secondary | ICD-10-CM

## 2019-11-01 DIAGNOSIS — K047 Periapical abscess without sinus: Secondary | ICD-10-CM

## 2019-11-01 LAB — HEMOGLOBIN, FINGERSTICK: Hemoglobin: 12.5 g/dL (ref 11.1–15.9)

## 2019-11-01 MED ORDER — AMOXICILLIN 500 MG PO CAPS
500.0000 mg | ORAL_CAPSULE | Freq: Three times a day (TID) | ORAL | 0 refills | Status: AC
Start: 2019-11-01 — End: 2019-11-08

## 2019-11-01 MED ORDER — MEDROXYPROGESTERONE ACETATE 150 MG/ML IM SUSP
150.0000 mg | Freq: Once | INTRAMUSCULAR | Status: AC
  Administered 2019-11-01: 150 mg via INTRAMUSCULAR

## 2019-11-01 NOTE — Progress Notes (Signed)
Presents for postpartum visit. SVD via VBAC on 7/24. Formula feeding. No periods or sex since birth. Depo given at 10.3 weeks with consent of provider AFS. Sharlyne Pacas, RN

## 2019-11-01 NOTE — Progress Notes (Signed)
Post Partum Exam  Erica [name] is a 39 y.o. [id number] female who presents for a postpartum visit. She is 10 weeks postpartum following a vaginal birth after cearean. I have fully reviewed the prenatal and intrapartum course. The delivery was at 38 4/7 gestational weeks.  Anesthesia: epidural and pudendal block. Postpartum course has been challenging for mom due to chaotic home situation and lack of assistance with baby/kids. Baby's course has been uneventful. Ba is feeding by Bottle Bleeding no bleeding. Bowel function is normal with some hemorrhoid discomfort. Bladder function is normal. Patient is not sexually active. Contraception method is abstinence.   Postpartum depression screening: EPDS score today = 4  Last pap smear done 03/18/19 and was Normal  Review of Systems Pertinent items noted in HPI and remainder of comprehensive ROS otherwise negative.    Objective:  BP 116/70   Pulse 79   Ht 5\' 5"  (1.651 m)   Wt 151 lb 9.6 oz (68.8 kg)   LMP 01/17/2019   BMI 25.23 kg/m   Gen: well appearing, NAD HEENT: no scleral icterus Mouth: several L lower molars with severe decay and gingival swelling/tenderness CV: RR Lung: Normal WOB Breast:performed-yes  Ext: warm well perfused  GU: WNL Rectal: performed -  not indicated       Assessment:   Postpartum exam completed. Pap smear not done at today's visit.   Plan:   Essential components of care per ACOG recommendations for Comprehensive Postpartum exam:  1.  Mood and well being: Patient with negative depression screening today. Reviewed local resources for support. EPDS is low risk. Reviewed resources and that mood sx in first year after pregnancy are considered related to pregnancy and to reach out for help at [facility] if needed. Discussed [facility] as link to care and availability of LCSW for counseling  - Patient does use tobacco. If using tobacco we discussed reduction and for recently cessation risk of relapse - hx of drug use? Yes    If yes, discussed support systems in place  2. Infant care and feeding:  -Patient currently breastmilk feeding? No If breastmilk feeding discussed return to work and pumping. If needed, patient was provided letter for work to allow for every 2-3 hr pumping breaks, and to be granted a private location to express breastmilk and refrigerated area to store breastmilk. Reviewed importance of draining breast regularly to support lactation. I  -Recommended patient engage with WIC/BFpeer counselors  -Counseled to sign new child up for [facility] services -Social determinants of health (SDOH) reviewed in EPIC. Recommend PPP and OBCM eval for support. The following needs were identified  3. Sexuality, contraception and birth spacing  Contraception: Contraception counseling: Reviewed all forms of birth control options in the tiered based approach. available including abstinence; over the counter/barrier methods; hormonal contraceptive medication including pill, patch, ring, injection,contraceptive implant; hormonal and nonhormonal IUDs; permanent sterilization options including vasectomy and the various tubal sterilization modalities. Risks, benefits, and typical effectiveness rates were reviewed.  Questions were answered.  Written information was also given to the patient to review.  Patient desires DMPA, this was prescribed for patient. She will follow up in  3 months for surveillance.  She was told to call with any further questions, or with any concerns about this method of contraception.  Emphasized use of condoms 100% of the time for STI prevention.  - Patient does not want a pregnancy in the next year.  Desired family size is complete.  - Reviewed forms of contraception in  tiered fashion. Patient desired Depo-Provera today.   - Discussed birth spacing of 18 months  4. Sleep and fatigue -Encouraged family/partner/community support of 4 hrs of uninterrupted sleep to help with mood and fatigue  5. Physical  Recovery  - Discussed patients delivery and complications - Patient had no laceration or episiotomy, perineal healing reviewed. Patient expressed understanding - Patient has urinary incontinence? No Patient was referred to pelvic floor PT  - Patient is safe to resume physical and sexual activity  6.  Health Maintenance/Chronic Disease - Last pap smear performed 02/2019 and was normal with negative HPV.  1. Postpartum care following vaginal delivery Hgb 12.5 g/dL. Referral to PPP, OBCM for any available support. - Hemoglobin, venipuncture  2. Family planning services DMPA 150 mg IM today and continue every 3 months for 1 year.  3. Tobacco abuse Encouraged cessation.  4. Tooth abscess Pt enc to reschedule dental eval ASAP. Will rx antibiotic (amoxicillin 500 mg 1 po tid for 7 days.)   Patient given handout about PCP care in the community Given [facility]VI per family planning program guidelines and availability  Follow up in: 11-13 weeks or as needed.

## 2020-01-03 ENCOUNTER — Other Ambulatory Visit: Payer: Self-pay

## 2020-01-03 ENCOUNTER — Ambulatory Visit: Payer: Medicaid Other | Admitting: Physician Assistant

## 2020-01-03 ENCOUNTER — Ambulatory Visit: Payer: Medicaid Other

## 2020-01-03 ENCOUNTER — Encounter: Payer: Self-pay | Admitting: Physician Assistant

## 2020-01-03 DIAGNOSIS — Z113 Encounter for screening for infections with a predominantly sexual mode of transmission: Secondary | ICD-10-CM | POA: Diagnosis not present

## 2020-01-03 LAB — WET PREP FOR TRICH, YEAST, CLUE
Trichomonas Exam: NEGATIVE
Yeast Exam: NEGATIVE

## 2020-01-03 NOTE — Progress Notes (Signed)
Wet mount reviewed, no tx per provider orders. Pt counseled about depo side effects and reminder card provided for depo due date of 01/17/20 ([facility] opens back up 01/22/20), and other testing pending. Provider orders complete.

## 2020-01-03 NOTE — Progress Notes (Signed)
[facility] Department STI clinic/screening visit  Subjective:  Erica [name] is a 38 y.o. female being seen today for an STI screening visit. The patient reports they do have symptoms.  Patient reports that they do not desire a pregnancy in the next year.   They reported they are not interested in discussing contraception today.  No LMP recorded. Having irreg spotting for several months.   Patient has the following medical conditions:   Patient Active Problem List   Diagnosis Date Noted  . 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 History of substance abuse (HCC) 03/23/2019    Chief Complaint  Patient presents with  . SEXUALLY TRANSMITTED DISEASE    STD screening    Here for eval of vaginal discharge, odor and itch.   Patient reports she is on DMPA.  Last HIV test per patient/review of record was 08/18/19 (neg). Patient reports last pap was 03/23/19 (neg co-testing).   See flowsheet for further details and programmatic requirements.    The following portions of the patient's history were reviewed and updated as appropriate: allergies, current medications, past medical history, past social history, past surgical history and problem list.  Objective:  There were no vitals filed for this visit.  Physical Exam Vitals and nursing note reviewed.  Constitutional:      Appearance: Normal appearance.  HENT:     Head: Normocephalic and atraumatic.     Mouth/Throat:     Mouth: Mucous membranes are moist.     Pharynx: Oropharynx is clear. No oropharyngeal exudate or posterior oropharyngeal erythema.  Pulmonary:     Effort: Pulmonary effort is normal.  Chest:  Breasts:     Right: No axillary adenopathy or supraclavicular adenopathy.     Left: No axillary adenopathy or supraclavicular adenopathy.    Abdominal:     General: Abdomen is flat.     Palpations: There is no mass.     Tenderness: There is no abdominal tenderness. There is no rebound.  Genitourinary:     General: Normal vulva.     Exam position: Lithotomy position.     Pubic Area: No rash or pubic lice.      Labia:        Right: No rash or lesion.        Left: No rash or lesion.      Vagina: Vaginal discharge and bleeding present. No erythema or lesions.     Cervix: No cervical motion tenderness, discharge, friability, lesion or erythema.     Uterus: Normal.      Adnexa: Right adnexa normal and left adnexa normal.     Rectum: Normal.     Comments: Mod amt brownish red discharge in vag vault. PH not assessed in light of blood. Lymphadenopathy:     Head:     Right side of head: No preauricular or posterior auricular adenopathy.     Left side of head: No preauricular or posterior auricular adenopathy.     Cervical: No cervical adenopathy.     Upper Body:     Right upper body: No supraclavicular or axillary adenopathy.     Left upper body: No supraclavicular or axillary adenopathy.     Lower Body: No right inguinal adenopathy. No left inguinal adenopathy.  Skin:    General: Skin is warm and dry.     Findings: No rash.  Neurological:     Mental Status: She is alert and oriented to person, place, and time.  Assessment and Plan:  Erica [name] is a 39 y.o. female presenting to the [facility] Department for STI screening  1. Routine screening for STI (sexually transmitted infection) Wet prep = neg. Suspect brown discharge/odor due to light irreg menstrual bleeding as a side effect of DMPA. Await GC/Chlam results and plan to keep come for next DMPA injection as planned. - WET PREP FOR TRICH, YEAST, CLUE - Chlamydia/Gonorrhea [facility]    Return in about 2 weeks (around 01/17/2020) for Routine DMPA injection.  No future appointments.  Landry Dyke, PA-C

## 2020-01-25 ENCOUNTER — Emergency Department
Admission: EM | Admit: 2020-01-25 | Discharge: 2020-01-25 | Disposition: A | Discharge status: Home / Self Care | Payer: Medicaid Other | Attending: Emergency Medicine | Admitting: Emergency Medicine

## 2020-01-25 ENCOUNTER — Other Ambulatory Visit: Payer: Self-pay

## 2020-01-25 DIAGNOSIS — L239 Allergic contact dermatitis, unspecified cause: Secondary | ICD-10-CM

## 2020-01-25 DIAGNOSIS — F17210 Nicotine dependence, cigarettes, uncomplicated: Secondary | ICD-10-CM | POA: Diagnosis not present

## 2020-01-25 DIAGNOSIS — B349 Viral infection, unspecified: Secondary | ICD-10-CM

## 2020-01-25 DIAGNOSIS — U071 COVID-19: Secondary | ICD-10-CM | POA: Diagnosis not present

## 2020-01-25 DIAGNOSIS — R509 Fever, unspecified: Secondary | ICD-10-CM | POA: Diagnosis present

## 2020-01-25 LAB — POC SARS CORONAVIRUS 2 AG -  ED: SARS Coronavirus 2 Ag: NEGATIVE

## 2020-01-25 MED ORDER — FEXOFENADINE-PSEUDOEPHED ER 60-120 MG PO TB12
1.0000 | ORAL_TABLET | Freq: Two times a day (BID) | ORAL | 0 refills | Status: DC
Start: 2020-01-25 — End: 2021-11-18

## 2020-01-25 MED ORDER — AMOXICILLIN 875 MG PO TABS: 875.0000 mg | ORAL_TABLET | Freq: Two times a day (BID) | ORAL | 0 refills | Status: DC

## 2020-01-25 MED ORDER — TRIAMCINOLONE ACETONIDE 0.5 % EX OINT
1.0000 | TOPICAL_OINTMENT | Freq: Two times a day (BID) | CUTANEOUS | 0 refills | Status: DC
Start: 2020-01-25 — End: 2021-10-01

## 2020-01-25 NOTE — ED Notes (Signed)
See triage note. Pt c/o rash on hands, joint aches, and fever yesterday. Pt reports two people at her house are currently covid positive. Pt resting calmly in bed. Resp reg/unlabored. Skin dry. Slight red rash noted to pt's R wrist area. In NAD.

## 2020-01-25 NOTE — ED Provider Notes (Signed)
[facility] Emergency Department Provider Note   ____________________________________________   Event Date/Time   First MD Initiated Contact with Patient 01/25/20 1230     (approximate)  I have reviewed the triage vital signs and the nursing notes.   HISTORY  Chief Complaint Fever    HPI Erica [name] is a 39 y.o. female patient presents with fever for 1 day.  Patient also complain of myalgia and arthralgia.  Patient did test to people in her household who tested positive for COVID-19.  Patient  has not taken Covid vaccine or flu shot for this season.  Patient also complaining of nasal congestion and sore throat.  Patient rates her pain as 7/10.  Described pain is "achy".  No palliative measure for complaint.         Past Medical History:  Diagnosis Date  . Abnormal Pap smear   . Anemia   . Anxiety    as teen  . Anxiety   . Depression    as teen  . GERD (gastroesophageal reflux disease)   . Headache(784.0)   . History of inadequate prenatal care (no care x8 weeks) 05/23/2019   Feb 26 - May 22, 2019  . History of successful vaginal birth after cesarean, currently pregnant 05/19/2011   Desires repeat c/s and BTL.  Marland Kitchen No pertinent past medical history   . Normal labor and delivery   . Pneumonia   . Postpartum depression   . Pregnancy induced hypertension   . Preterm labor   . Preterm uterine contractions   . Term pregnancy 08/18/2019  . Tobacco use in pregnancy, childbirth, or the puerperium, antepartum 03/23/2019   03/23/19: smokes 5-8 cpd, pt's stated goal is to reduce to 5 cpd   . Vaginal birth after cesarean section 08/20/2019  . Vaginal Pap smear, abnormal   . VBAC, delivered, current hospitalization 05/19/2011    Patient Active Problem List   Diagnosis Date Noted  . 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 History of substance abuse (HCC) 03/23/2019    Past Surgical History:  Procedure Laterality Date  . APPENDECTOMY    . CESAREAN  SECTION    . COLPOSCOPY    . DILATION AND CURETTAGE OF UTERUS  2010  . LAPAROSCOPIC APPENDECTOMY N/A 07/11/2016   Procedure: APPENDECTOMY LAPAROSCOPIC;  Surgeon: Ricarda Frame, MD;  Location: [facility] ORS;  Service: General;  Laterality: N/A;  . WISDOM TOOTH EXTRACTION      Prior to Admission medications   Medication Sig Start Date End Date Taking? Authorizing Provider  amoxicillin (AMOXIL) 875 MG tablet Take 1 tablet (875 mg total) by mouth 2 (two) times daily. 01/25/20  Yes Joni Reining, PA-C  fexofenadine-pseudoephedrine (ALLEGRA-D) 60-120 MG 12 hr tablet Take 1 tablet by mouth 2 (two) times daily. 01/25/20  Yes Joni Reining, PA-C  triamcinolone ointment (KENALOG) 0.5 % Apply 1 application topically 2 (two) times daily. 01/25/20  Yes Joni Reining, PA-C  acetaminophen (TYLENOL) 325 MG tablet Take 2 tablets (650 mg total) by mouth every 4 (four) hours as needed for mild pain or headache (for pain scale < 4). Patient not taking: Reported on 09/18/2019 08/20/19   Farrel Conners, CNM  ibuprofen (ADVIL) 600 MG tablet Take 1 tablet (600 mg total) by mouth every 6 (six) hours as needed for mild pain, moderate pain or cramping. 08/20/19   Farrel Conners, CNM  Prenatal Vit-Fe Fumarate-FA (PRENATAL VITAMIN) 27-0.8 MG TABS Take 1 tablet by mouth daily. Patient not taking:  Reported on 09/18/2019 07/02/19   Alberteen Spindle, CNM    Allergies Levaquin [levofloxacin in d5w]  Family History  Problem Relation Age of Onset  . Hypertension Mother   . Seizures Mother   . Hypertension Maternal Grandmother   . Arthritis Maternal Grandmother   . Cancer Maternal Grandmother        breast  . Stroke Maternal Grandfather   . Seizures Brother   . Birth defects Daughter        hole in heart  . Seizures Maternal Aunt   . Seizures Brother   . Seizures Cousin   . Seizures Cousin   . Mental retardation Cousin     Social History Social History   Tobacco Use  . Smoking status: Current Every  Day Smoker    Packs/day: 0.25    Years: 13.00    Pack years: 3.25    Types: Cigarettes  . Smokeless tobacco: Never Used  Vaping Use  . Vaping Use: Never used  Substance Use Topics  . Alcohol use: Not Currently    Comment: 12/06/2018  . Drug use: Not Currently    Types: Cocaine    Comment: Not currently    Review of Systems Constitutional: No fever/chills Eyes: No visual changes. ENT: Sore throat.  Nasal congestion.  Cardiovascular: Denies chest pain. Respiratory: Denies shortness of breath.  Nonproductive cough. Gastrointestinal: No abdominal pain.  No nausea, no vomiting.  No diarrhea.  No constipation. Genitourinary: Negative for dysuria. Musculoskeletal: Negative for back pain. Skin: Negative for rash. Neurological: Negative for headaches, focal weakness or numbness. Psychiatric: Anxiety and substance abuse.  Allergic/Immunilogical: Levaquin  ____________________________________________   PHYSICAL EXAM:  VITAL SIGNS: ED Triage Vitals  Enc Vitals Group     BP 01/25/20 1219 123/71     Pulse Rate 01/25/20 1219 88     Resp 01/25/20 1219 14     Temp 01/25/20 1219 99 F (37.2 C)     Temp Source 01/25/20 1219 Oral     SpO2 01/25/20 1219 98 %     Weight 01/25/20 1216 162 lb (73.5 kg)     Height --      Head Circumference --      Peak Flow --      Pain Score 01/25/20 1216 7     Pain Loc --      Pain Edu? --      Excl. in GC? --    Constitutional: Alert and oriented. Well appearing and in no acute distress. Eyes: Conjunctivae are normal. PERRL. EOMI. Head: Atraumatic. Nose: Clear rhinorrhea. Mouth/Throat: Mucous membranes are moist.  Oropharynx non-erythematous.  Postnasal drainage. Neck: No stridor. Hematological/Lymphatic/Immunilogical: No cervical lymphadenopathy. Cardiovascular: Normal rate, regular rhythm. Grossly normal heart sounds.  Good peripheral circulation. Respiratory: Normal respiratory effort.  No retractions. Lungs CTAB. Gastrointestinal: Soft and  nontender. No distention. No abdominal bruits. No CVA tenderness. Musculoskeletal: No lower extremity tenderness nor edema.  No joint effusions. Neurologic:  Normal speech and language. No gross focal neurologic deficits are appreciated. No gait instability. Skin:  Skin is warm, dry and intact. No rash noted. Psychiatric: Mood and affect are normal. Speech and behavior are normal.  ____________________________________________   LABS (all labs ordered are listed, but only abnormal results are displayed)  Labs Reviewed  SARS CORONAVIRUS 2 (TAT 6-24 HRS)  POC SARS CORONAVIRUS 2 AG -  ED   ____________________________________________  EKG   ____________________________________________  RADIOLOGY Margarite Gouge, personally viewed and evaluated these images (plain radiographs)  as part of my medical decision making, as well as reviewing the written report by the radiologist.  ED MD interpretation:    Official radiology report(s): No results found.  ____________________________________________   PROCEDURES  Procedure(s) performed (including Critical Care):  Procedures   ____________________________________________   INITIAL IMPRESSION / ASSESSMENT AND PLAN / ED COURSE  As part of my medical decision making, I reviewed the following data within the electronic MEDICAL RECORD NUMBER         Patient presents with fever.  Patient was concerned because to household members have tested positive for COVID-19.  Patient rapid Covid test was negative.  Patient advised confirmatory results are pending.  Patient also has a rash secondary to contact dermatitis.  Patient given discharge care instruction advised take medication as directed.  Patient advised that she is positive COVID-19 must quarantine for 10 days.      ____________________________________________   FINAL CLINICAL IMPRESSION(S) / ED DIAGNOSES  Final diagnoses:  Viral illness  Allergic contact dermatitis,  unspecified trigger     ED Discharge Orders         Ordered    triamcinolone ointment (KENALOG) 0.5 %  2 times daily        01/25/20 1418    fexofenadine-pseudoephedrine (ALLEGRA-D) 60-120 MG 12 hr tablet  2 times daily        01/25/20 1418    amoxicillin (AMOXIL) 875 MG tablet  2 times daily        01/25/20 1418          *Please note:  Erica [name] was evaluated in Emergency Department on 01/25/2020 for the symptoms described in the history of present illness. She was evaluated in the context of the global COVID-19 pandemic, which necessitated consideration that the patient might be at risk for infection with the SARS-CoV-2 virus that causes COVID-19. Institutional protocols and algorithms that pertain to the evaluation of patients at risk for COVID-19 are in a state of rapid change based on information released by regulatory bodies including the CDC and federal and state organizations. These policies and algorithms were followed during the patient's care in the ED.  Some ED evaluations and interventions may be delayed as a result of limited staffing during and the pandemic.*   Note:  This document was prepared using Dragon voice recognition software and may include unintentional dictation errors.    Joni Reining, PA-C 01/25/20 1424    Dionne Bucy, MD 01/25/20 [phone]

## 2020-01-25 NOTE — Discharge Instructions (Addendum)
Your rapid Covid test was negative.  Your confirmatory results are pending and can be found in the MyChart app in approximately 6 to 8 hours.  If your test is positive you must quarantine additional 10 days.  Follow discharge care instruction take medication as directed.

## 2020-01-25 NOTE — ED Triage Notes (Signed)
Pt presents via POV c/o fever x1 day. Reports people in household have Covid+.

## 2020-01-26 LAB — SARS CORONAVIRUS 2 (TAT 6-24 HRS): SARS Coronavirus 2: POSITIVE — AB

## 2020-03-19 ENCOUNTER — Other Ambulatory Visit: Payer: Self-pay

## 2020-03-19 ENCOUNTER — Other Ambulatory Visit (HOSPITAL_COMMUNITY)
Admission: RE | Admit: 2020-03-19 | Discharge: 2020-03-19 | Disposition: A | Discharge status: Home / Self Care | Payer: Medicaid Other | Source: Ambulatory Visit | Attending: Obstetrics and Gynecology | Admitting: Obstetrics and Gynecology

## 2020-03-19 ENCOUNTER — Ambulatory Visit (INDEPENDENT_AMBULATORY_CARE_PROVIDER_SITE_OTHER): Payer: Medicaid Other | Admitting: Obstetrics and Gynecology

## 2020-03-19 ENCOUNTER — Encounter: Payer: Self-pay | Admitting: Obstetrics and Gynecology

## 2020-03-19 VITALS — Ht 64.0 in | Wt 165.6 lb

## 2020-03-19 DIAGNOSIS — Z113 Encounter for screening for infections with a predominantly sexual mode of transmission: Secondary | ICD-10-CM

## 2020-03-19 DIAGNOSIS — A5909 Other urogenital trichomoniasis: Secondary | ICD-10-CM | POA: Diagnosis not present

## 2020-03-19 DIAGNOSIS — N926 Irregular menstruation, unspecified: Secondary | ICD-10-CM

## 2020-03-19 DIAGNOSIS — N939 Abnormal uterine and vaginal bleeding, unspecified: Secondary | ICD-10-CM

## 2020-03-19 DIAGNOSIS — R300 Dysuria: Secondary | ICD-10-CM

## 2020-03-19 DIAGNOSIS — Z124 Encounter for screening for malignant neoplasm of cervix: Secondary | ICD-10-CM

## 2020-03-19 LAB — POCT URINALYSIS DIPSTICK
Bilirubin, UA: NEGATIVE
Blood, UA: POSITIVE
Glucose, UA: NEGATIVE
Ketones, UA: NEGATIVE
Nitrite, UA: NEGATIVE
Protein, UA: NEGATIVE
Spec Grav, UA: >=1.03 — AB (ref 1.010–1.025)
Urobilinogen, UA: 0.2 E.U./dL
pH, UA: 5 (ref 5.0–8.0)

## 2020-03-19 LAB — POCT URINE PREGNANCY: Preg Test, Ur: NEGATIVE

## 2020-03-19 MED ORDER — METRONIDAZOLE 500 MG PO TABS: 2000.0000 mg | ORAL_TABLET | Freq: Once | ORAL | 0 refills | Status: AC

## 2020-03-19 NOTE — Progress Notes (Signed)
Patient ID: Erica [name], female   DOB: [birth year], 40 y.o.   MRN: [id number]  Reason for Consult: Routine Prenatal Visit and Gynecologic Exam   Referred by Department, [facility]  Subjective:     HPI:  Erica [name] is a 40 y.o. female patient reports that she has been having some excessive menstrual bleeding.  She reports that this is been going on since the time of her delivery.  She reports that initially after her delivery she received a Depo injection.  She followed up and received a second injection but her last injection occurred in October 2021.  She was due for Depo injection in January but skipped this dose of medication.  She reports that she has been having heavy bleeding and some passage of clot-like material.  She has some gushing and flooding sensations.  She has had accidents where she bleeds through her clothes.  She has been having some pelvic pain.  She reports that she had had trichomonas in the past and is experiencing some vulvar itching.  She felt like her symptoms of trichomonas were similar to what she is currently experiencing.  While she is not currently taking contraception she is interested in restarting birth control methods.  She reports that she would like to have a tubal ligation.  She is supposed to have a tubal ligation after her delivery but there was an issue regarding her paperwork.  She would like to schedule an interval tubal at this time.  Gynecological History Menarche: 5 LMP: Uncertain actively having bleeding Last pap smear: 2021 nil History of STDs: Yes Sexually Active: Yes  Obstetrical History OB History  Gravida Para Term Preterm AB Living  11 7 5 2 4 7   SAB IAB Ectopic Multiple Live Births  3 0 1 0 7    # Outcome Date GA Lbr Len/2nd Weight Sex Delivery Anes PTL Lv  11 Term 08/18/19 [redacted]w[redacted]d 08:42 / 00:16 5 lb 4 oz (2.38 kg) F VBAC EPI  LIV  10 Ectopic 2020          9 SAB 2019          8 Term 05/19/11 [redacted]w[redacted]d 04:32 / 00:05 6 lb 4 oz  (2.835 kg) M Vag-Spont EPI  LIV     Birth Comments: No anomalies noted  7 SAB 2011 [redacted]w[redacted]d         6 Preterm 2008 [redacted]w[redacted]d  4 lb 2 oz (1.871 kg) F CS-LTranv   LIV     Birth Comments: PIH IUGR  5 Preterm 2006 [redacted]w[redacted]d 06:00  F Vag-Spont EPI  LIV     Birth Comments: fetal blocked kidney, doesn't talk mentally handicapped, BUFA  4 Term 2005 [redacted]w[redacted]d 05:00 8 lb 14 oz (4.026 kg) M Vag-Spont EPI  LIV     Birth Comments: BUFA  3 Term 2002 [redacted]w[redacted]d 06:00 7 lb 7 oz (3.374 kg) F Vag-Spont EPI  LIV     Birth Comments: BUFA  2 Term 2000 [redacted]w[redacted]d 13:00 8 lb 12 oz (3.969 kg) F Vag-Spont EPI  LIV     Birth Comments: adopted had C diff and chicken pox during pregnancy BUFA  1 SAB              Past Medical History:  Diagnosis Date  . Abnormal Pap smear   . Anemia   . Anxiety    as teen  . Anxiety   . Depression    as teen  . GERD (gastroesophageal reflux disease)   .  Headache(784.0)   . History of inadequate prenatal care (no care x8 weeks) 05/23/2019   Feb 26 - May 22, 2019  . History of successful vaginal birth after cesarean, currently pregnant 05/19/2011   Desires repeat c/s and BTL.  Marland Kitchen No pertinent past medical history   . Normal labor and delivery   . Pneumonia   . Postpartum depression   . Pregnancy induced hypertension   . Preterm labor   . Preterm uterine contractions   . Term pregnancy 08/18/2019  . Tobacco use in pregnancy, childbirth, or the puerperium, antepartum 03/23/2019   03/23/19: smokes 5-8 cpd, pt's stated goal is to reduce to 5 cpd   . Vaginal birth after cesarean section 08/20/2019  . Vaginal Pap smear, abnormal   . VBAC, delivered, current hospitalization 05/19/2011   Family History  Problem Relation Age of Onset  . Hypertension Mother   . Seizures Mother   . Hypertension Maternal Grandmother   . Arthritis Maternal Grandmother   . Cancer Maternal Grandmother        breast  . Stroke Maternal Grandfather   . Seizures Brother   . Birth defects Daughter        hole in heart  .  Seizures Maternal Aunt   . Seizures Brother   . Seizures Cousin   . Seizures Cousin   . Mental retardation Cousin    Past Surgical History:  Procedure Laterality Date  . APPENDECTOMY    . CESAREAN SECTION    . COLPOSCOPY    . DILATION AND CURETTAGE OF UTERUS  2010  . LAPAROSCOPIC APPENDECTOMY N/A 07/11/2016   Procedure: APPENDECTOMY LAPAROSCOPIC;  Surgeon: Ricarda Frame, MD;  Location: [facility] ORS;  Service: General;  Laterality: N/A;  . WISDOM TOOTH EXTRACTION      Short Social History:  Social History   Tobacco Use  . Smoking status: Current Every Day Smoker    Packs/day: 0.25    Years: 13.00    Pack years: 3.25    Types: Cigarettes  . Smokeless tobacco: Never Used  Substance Use Topics  . Alcohol use: Not Currently    Comment: 12/06/2018    Allergies  Allergen Reactions  . Levaquin [Levofloxacin In D5w] Swelling    Lip swelling, hives, chest tightness    Current Outpatient Medications  Medication Sig Dispense Refill  . metroNIDAZOLE (FLAGYL) 500 MG tablet Take 4 tablets (2,000 mg total) by mouth once for 1 dose. 4 tablet 0  . acetaminophen (TYLENOL) 325 MG tablet Take 2 tablets (650 mg total) by mouth every 4 (four) hours as needed for mild pain or headache (for pain scale < 4). (Patient not taking: Reported on 09/18/2019) 100 tablet 0  . amoxicillin (AMOXIL) 875 MG tablet Take 1 tablet (875 mg total) by mouth 2 (two) times daily. 20 tablet 0  . fexofenadine-pseudoephedrine (ALLEGRA-D) 60-120 MG 12 hr tablet Take 1 tablet by mouth 2 (two) times daily. 20 tablet 0  . ibuprofen (ADVIL) 600 MG tablet Take 1 tablet (600 mg total) by mouth every 6 (six) hours as needed for mild pain, moderate pain or cramping. 50 tablet 1  . Prenatal Vit-Fe Fumarate-FA (PRENATAL VITAMIN) 27-0.8 MG TABS Take 1 tablet by mouth daily. (Patient not taking: Reported on 09/18/2019) 30 tablet 1  . triamcinolone ointment (KENALOG) 0.5 % Apply 1 application topically 2 (two) times daily. 30 g 0   No  current facility-administered medications for this visit.    Review of Systems  Constitutional: Negative for chills, fatigue,  fever and unexpected weight change.  HENT: Negative for trouble swallowing.  Eyes: Negative for loss of vision.  Respiratory: Negative for cough, shortness of breath and wheezing.  Cardiovascular: Negative for chest pain, leg swelling, palpitations and syncope.  GI: Negative for abdominal pain, blood in stool, diarrhea, nausea and vomiting.  GU: Negative for difficulty urinating, dysuria, frequency and hematuria.  Musculoskeletal: Negative for back pain, leg pain and joint pain.  Skin: Negative for rash.  Neurological: Negative for dizziness, headaches, light-headedness, numbness and seizures.  Psychiatric: Negative for behavioral problem, confusion, depressed mood and sleep disturbance.        Objective:  Objective   Vitals:   03/19/20 1526  Weight: 165 lb 9.6 oz (75.1 kg)  Height: 5\' 4"  (1.626 m)   Body mass index is 28.43 kg/m.  Physical Exam Vitals and nursing note reviewed.  Constitutional:      Appearance: She is well-developed and well-nourished.  HENT:     Head: Normocephalic and atraumatic.  Eyes:     Extraocular Movements: EOM normal.     Pupils: Pupils are equal, round, and reactive to light.  Cardiovascular:     Rate and Rhythm: Normal rate and regular rhythm.  Pulmonary:     Effort: Pulmonary effort is normal. No respiratory distress.  Abdominal:     General: Abdomen is flat.     Palpations: Abdomen is soft.  Genitourinary:    Comments: External: Vulva normal. No lesions noted.  Speculum examination:  Cervix erythematous and friable . Small blood in the vaginal vault. Frothy red discharge.   Bimanual examination: Uterus tender, midline, normal in size.  mild CMT. Adnexa normal. Pelvis is not fixed.   Skin:    General: Skin is warm and dry.  Neurological:     Mental Status: She is alert and oriented to person, place, and time.   Psychiatric:        Mood and Affect: Mood and affect normal.        Behavior: Behavior normal.        Thought Content: Thought content normal.        Judgment: Judgment normal.     Assessment/Plan:     40 yo with irregular menstrual bleeding.  1. Discharge and cervical appearance consistent with trichomonas. Will treat prior to test result. Patient declined EPT today.  2. AUB- Will follow up for pelvic 20 3. Desires a tubal ligation. Would like to start OCP to bridge to the tubal ligation. Patient given 3 month supply of OCP pill pack samples. Discussed how to take this medication. Note sent to surgical scheduler.   UA WNL Urine pregnancy test today is negative.  More than 20 minutes were spent face to face with the patient in the room, reviewing the medical record, labs and images, and coordinating care for the patient. The plan of management was discussed in detail and counseling was provided.    [place] MD [facility], [facility] 03/19/2020 5:52 PM

## 2020-03-19 NOTE — Patient Instructions (Signed)
Abnormal Uterine Bleeding  Abnormal uterine bleeding is unusual bleeding from the uterus. It includes bleeding after sex, or bleeding or spotting between menstrual periods. It may also include bleeding that is heavier than normal, menstrual periods that last longer than usual, or bleeding that occurs after menopause. Abnormal uterine bleeding can affect teenagers, women in their reproductive years, pregnant women, and women who have reached menopause. Common causes of abnormal uterine bleeding include:  Pregnancy.  Growths of tissue (polyps).  Benign tumors or growths in the uterus (fibroids). These are not cancer.  Infection.  Cancer.  Too much or too little of some hormones in the body (hormonal imbalances). Any type of abnormal bleeding should be checked by a health care provider. Many cases are minor and simple to treat, but others may be more serious. Treatment will depend on the cause and severity of the bleeding. Follow these instructions at home: Medicines  Take over-the-counter and prescription medicines only as told by your health care provider.  Tell your health care provider about other medicines that you take. You may be asked to stop taking aspirin or medicines that contain aspirin. These medicines can make bleeding worse.  If you were prescribed iron pills, take them as told by your health care provider. Iron pills help to replace iron that your body loses because of this condition. Managing constipation In cases of severe bleeding, you may be asked to increase your iron intake to treat anemia. This may cause constipation. To prevent or treat constipation, you may need to:  Drink enough fluid to keep your urine pale yellow.  Take over-the-counter or prescription medicines.  Eat foods that are high in fiber, such as beans, whole grains, and fresh fruits and vegetables.  Limit foods that are high in fat and processed sugars, such as fried or sweet foods. General  instructions  Monitor your condition for any changes.  Do not use tampons, douche, or have sex until your health care provider says these things are okay.  Change your pads often.  Get regular exams. This includes pelvic exams and cervical cancer screenings. ? It is up to you to get the results of any tests that are done. Ask your health care provider, or the department that is doing the tests, when your results will be ready.  Keep all follow-up visits as told by your health care provider. This is important. Contact a health care provider if you:  Have bleeding that lasts for more than 1 week.  Feel dizzy at times.  Feel nauseous or you vomit.  Feel light-headed or weak.  Notice any other changes that show that your condition is getting worse. Get help right away if you:  Pass out.  Have bleeding that soaks through a pad every hour.  Have pain in the abdomen.  Have a fever or chills.  Become sweaty or weak.  Pass large blood clots from your vagina. Summary  Abnormal uterine bleeding is unusual bleeding from the uterus.  Any type of abnormal bleeding should be evaluated by a health care provider. Many cases are minor and simple to treat, but others may be more serious.  Treatment will depend on the cause of the bleeding.  Get help right away if you pass out, you have bleeding that soaks through a pad every hour, or you pass large blood clots from your vagina. This information is not intended to replace advice given to you by your health care provider. Make sure you discuss any questions you   have with your health care provider. Document Revised: 09/19/2019 Document Reviewed: 11/14/2018 Elsevier Patient Education  2021 Elsevier Inc.  

## 2020-03-24 ENCOUNTER — Other Ambulatory Visit: Payer: Self-pay | Admitting: Obstetrics and Gynecology

## 2020-03-24 DIAGNOSIS — A749 Chlamydial infection, unspecified: Secondary | ICD-10-CM

## 2020-03-24 LAB — CYTOLOGY - PAP
Chlamydia: POSITIVE — AB
Comment: NEGATIVE
Comment: NEGATIVE
Comment: NORMAL
Diagnosis: NEGATIVE
Neisseria Gonorrhea: NEGATIVE
Trichomonas: POSITIVE — AB

## 2020-03-24 MED ORDER — AZITHROMYCIN 500 MG PO TABS: 1000.0000 mg | ORAL_TABLET | Freq: Once | ORAL | 1 refills | Status: AC

## 2020-04-07 ENCOUNTER — Other Ambulatory Visit: Payer: Medicaid Other

## 2020-04-07 ENCOUNTER — Ambulatory Visit: Payer: Medicaid Other | Admitting: Obstetrics and Gynecology

## 2020-04-10 ENCOUNTER — Telehealth: Payer: Self-pay

## 2020-04-10 NOTE — Telephone Encounter (Signed)
Tried to call pt to adv that we need to have her come into the office and sign a BTL consent 30 prior to doing the BTL. Her phone continues to be "busy".

## 2020-04-10 NOTE — Telephone Encounter (Signed)
-----   Message from Natale Milch, MD sent at 03/19/2020  5:57 PM EST ----- Surgery Booking Request Patient Full Name:  Erica [name]  MRN: [id number]  DOB: [birth year]  Surgeon: Natale Milch, MD  Requested Surgery Date and Time: next 1-2 months Primary Diagnosis AND Code: desires sterilization Secondary Diagnosis and Code:  Surgical Procedure: Robotic assisted laparoscopic tubal ligation RNFA Requested?: No L&D Notification: No Admission Status: same day surgery Length of Surgery: 100 min Special Case Needs: No H&P: Yes Phone Interview???: yes Interpreter: No Medical Clearance:  No Special Scheduling Instructions: Yes- patient has medicaid, may need to sign sterilization consent  Any known health/anesthesia issues, diabetes, sleep apnea, latex allergy, defibrillator/pacemaker?: No Acuity: P3   (P1 highest, P2 delay may cause harm, P3 low, elective gyn, P4 lowest)

## 2020-04-22 ENCOUNTER — Ambulatory Visit: Payer: Medicaid Other

## 2020-04-23 ENCOUNTER — Ambulatory Visit: Payer: Medicaid Other | Admitting: Obstetrics and Gynecology

## 2020-05-08 NOTE — Telephone Encounter (Signed)
Tried to contact pt and phone rang 3 times and then went dead. Unable to leave a msg.

## 2020-06-18 ENCOUNTER — Telehealth: Payer: Self-pay

## 2020-06-18 NOTE — Telephone Encounter (Signed)
error 

## 2020-06-18 NOTE — Telephone Encounter (Signed)
06/18/20 - Tried to contact pt and phone rang 3 times and then went dead. Unable to leave a msg.

## 2021-01-25 NOTE — L&D Delivery Note (Addendum)
Delivery Note Progressed quickly to complete dilation with a rapid first stage and 6 min second stage.  At 11:28 PM a viable and healthy female was delivered via VBAC, Spontaneous (Presentation: Right Occiput Anterior).  APGAR: pending, ; weight  .   Placenta status: Spontaneous, Intact.  Cord: 3 vessels with the following complications: None.   Early Pitocin and expedited third stage management.    Anesthesia: None Episiotomy: None Lacerations: None Suture Repair:  n/a Est. Blood Loss (mL): 33  Mom to postpartum.  Baby to Couplet care / Skin to Skin.  Erica [name] 11/19/2021, 11:52 PM

## 2021-04-24 ENCOUNTER — Telehealth: Payer: Medicaid Other | Admitting: Physician Assistant

## 2021-04-24 DIAGNOSIS — R3989 Other symptoms and signs involving the genitourinary system: Secondary | ICD-10-CM

## 2021-04-24 DIAGNOSIS — R0981 Nasal congestion: Secondary | ICD-10-CM

## 2021-04-24 DIAGNOSIS — Z202 Contact with and (suspected) exposure to infections with a predominantly sexual mode of transmission: Secondary | ICD-10-CM

## 2021-04-24 DIAGNOSIS — R112 Nausea with vomiting, unspecified: Secondary | ICD-10-CM

## 2021-04-24 NOTE — Progress Notes (Signed)
Based on what you shared with me, I feel your condition warrants further evaluation and I recommend that you be seen in a face to face visit. ? ?With multiple complaints, it is best for you to be fully evaluated in person to have appropriate testing completed to be treated adequately.  ?  ?NOTE: There will be NO CHARGE for this eVisit ?  ?If you are having a true medical emergency please call 911.   ?  ? For an urgent face to face visit, [place] has six urgent care centers for your convenience:  ?  ? [facility] at [facility] ?Get Driving Directions ?[phone] ?[phone] ?[place], [place] [address] ?  ? [facility]) ?Get Driving Directions ?[phone] ?[address] ?[place], [place] [address] ? ?[facility]) ?Get Driving Directions ?[phone] ?[address] ?[place],  [place]  [address] ? ?[facility] ?Get Driving Directions ?[phone] ?[address], [address] ?[place], [place] [address] ?  ?[facility] [place] ?Get Driving Directions  ?[phone] ?[address] ?[address] ?[place], [place] [address] ?  ?[facility] ?Get Driving Directions ?[phone] ?[address] ?[place], [place] [address] ? ?Your MyChart E-visit questionnaire answers were reviewed by a board certified advanced clinical practitioner to complete your personal care plan based on your specific symptoms.  Thank you for using e-Visits. ?  ?I provided 5 minutes of non face-to-face time during this encounter for chart review and documentation.  ? ?

## 2021-04-27 ENCOUNTER — Ambulatory Visit
Admission: RE | Admit: 2021-04-27 | Discharge: 2021-04-27 | Disposition: A | Discharge status: Home / Self Care | Payer: Medicaid Other | Source: Ambulatory Visit | Attending: Internal Medicine | Admitting: Internal Medicine

## 2021-04-27 ENCOUNTER — Ambulatory Visit: Payer: Self-pay

## 2021-04-27 VITALS — BP 144/85 | HR 101 | Temp 98.0°F | Resp 18 | Ht 64.0 in | Wt 165.6 lb

## 2021-04-27 DIAGNOSIS — Z3201 Encounter for pregnancy test, result positive: Secondary | ICD-10-CM

## 2021-04-27 DIAGNOSIS — Z113 Encounter for screening for infections with a predominantly sexual mode of transmission: Secondary | ICD-10-CM | POA: Diagnosis not present

## 2021-04-27 DIAGNOSIS — N898 Other specified noninflammatory disorders of vagina: Secondary | ICD-10-CM

## 2021-04-27 DIAGNOSIS — N3001 Acute cystitis with hematuria: Secondary | ICD-10-CM | POA: Diagnosis not present

## 2021-04-27 LAB — POCT URINALYSIS DIP (MANUAL ENTRY)
Bilirubin, UA: NEGATIVE
Glucose, UA: NEGATIVE mg/dL
Ketones, POC UA: NEGATIVE mg/dL
Nitrite, UA: NEGATIVE
Protein Ur, POC: NEGATIVE mg/dL
Spec Grav, UA: >=1.03 — AB (ref 1.010–1.025)
Urobilinogen, UA: 0.2 E.U./dL
pH, UA: 5.5 (ref 5.0–8.0)

## 2021-04-27 LAB — POCT URINE PREGNANCY: Preg Test, Ur: POSITIVE — AB

## 2021-04-27 MED ORDER — AMOXICILLIN 875 MG PO TABS: 875.0000 mg | ORAL_TABLET | Freq: Two times a day (BID) | ORAL | 0 refills | Status: DC

## 2021-04-27 NOTE — Discharge Instructions (Signed)
Your pregnancy test was positive.  It also appears that you may have a urinary tract infection.  You are being treated for this which is a medication that is safe with pregnancy.  Please follow-up with OB/GYN and start taking prenatal vitamins. ?

## 2021-04-27 NOTE — ED Provider Notes (Addendum)
?[facility] ? ? ? ?[name]: [id number] ?Arrival date & time: 04/27/21  1501 ? ? ?  ? ?History   ?Chief Complaint ?Chief Complaint  ?Patient presents with  ? Urinary Frequency  ?  Burning strong urine smell possible std fishy odor discharge foul smell light headed sinus problems - Entered by patient  ? Appointment  ? ? ?HPI ?Erica [name] is a 41 y.o. female.  ? ?Patient presents with urinary burning, fishy vaginal odor, vaginal discharge, urinary frequency, nausea that has been present for approximately 3 weeks.  Nausea started approximately 1 week ago.  Patient also has a new sexual partner so she is concerned for STD.  Last menstrual cycle was sometime in February per patient.  Denies abdominal pain, fever, back pain. ? ? ?Urinary Frequency ? ? ?Past Medical History:  ?Diagnosis Date  ? Abnormal Pap smear   ? Anemia   ? Anxiety   ? as teen  ? Anxiety   ? Depression   ? as teen  ? GERD (gastroesophageal reflux disease)   ? Headache(784.0)   ? History of inadequate prenatal care (no care x8 weeks) 05/23/2019  ? Feb 26 - May 22, 2019  ? History of successful vaginal birth after cesarean, currently pregnant 05/19/2011  ? Desires repeat c/s and BTL.  ? No pertinent past medical history   ? Normal labor and delivery   ? Pneumonia   ? Postpartum depression   ? Pregnancy induced hypertension   ? Preterm labor   ? Preterm uterine contractions   ? Term pregnancy 08/18/2019  ? Tobacco use in pregnancy, childbirth, or the puerperium, antepartum 03/23/2019  ? 03/23/19: smokes 5-8 cpd, pt's stated goal is to reduce to 5 cpd   ? Vaginal birth after cesarean section 08/20/2019  ? Vaginal Pap smear, abnormal   ? VBAC, delivered, current hospitalization 05/19/2011  ? ? ?Patient Active Problem List  ? Diagnosis Date Noted  ? [place] multipara G10 [phone] 03/23/2019  ? History of substance abuse ([place]) 03/23/2019  ? ? ?Past Surgical History:  ?Procedure Laterality Date  ? APPENDECTOMY    ? CESAREAN SECTION    ? COLPOSCOPY    ? DILATION  AND CURETTAGE OF UTERUS  2010  ? LAPAROSCOPIC APPENDECTOMY N/A 07/11/2016  ? Procedure: APPENDECTOMY LAPAROSCOPIC;  Surgeon: Clayburn Pert, MD;  Location: [facility] ORS;  Service: General;  Laterality: N/A;  ? WISDOM TOOTH EXTRACTION    ? ? ?OB History   ? ? Gravida  ?11  ? Para  ?7  ? Term  ?5  ? Preterm  ?2  ? AB  ?4  ? Living  ?7  ?  ? ? SAB  ?3  ? IAB  ?0  ? Ectopic  ?1  ? Multiple  ?0  ? Live Births  ?7  ?   ?  ?  ? ? ? ?Home Medications   ? ?Prior to Admission medications   ?Medication Sig Start Date End Date Taking? Authorizing Provider  ?amoxicillin (AMOXIL) 875 MG tablet Take 1 tablet (875 mg total) by mouth 2 (two) times daily for 7 days. 04/27/21 05/04/21 Yes Teodora Medici, FNP  ?acetaminophen (TYLENOL) 325 MG tablet Take 2 tablets (650 mg total) by mouth every 4 (four) hours as needed for mild pain or headache (for pain scale < 4). ?Patient not taking: Reported on 09/18/2019 08/20/19   Dalia Heading, CNM  ?fexofenadine-pseudoephedrine (ALLEGRA-D) 60-120 MG 12 hr tablet Take 1 tablet by mouth 2 (two) times  daily. 01/25/20   Sable Feil, PA-C  ?ibuprofen (ADVIL) 600 MG tablet Take 1 tablet (600 mg total) by mouth every 6 (six) hours as needed for mild pain, moderate pain or cramping. 08/20/19   Dalia Heading, CNM  ?triamcinolone ointment (KENALOG) 0.5 % Apply 1 application topically 2 (two) times daily. 01/25/20   Sable Feil, PA-C  ? ? ?Family History ?Family History  ?Problem Relation Age of Onset  ? Hypertension Mother   ? Seizures Mother   ? Hypertension Maternal Grandmother   ? Arthritis Maternal Grandmother   ? Cancer Maternal Grandmother   ?     breast  ? Stroke Maternal Grandfather   ? Seizures Brother   ? Birth defects Daughter   ?     hole in heart  ? Seizures Maternal Aunt   ? Seizures Brother   ? Seizures Cousin   ? Seizures Cousin   ? Mental retardation Cousin   ? ? ?Social History ?Social History  ? ?Tobacco Use  ? Smoking status: Every Day  ?  Packs/day: 0.25  ?  Years: 13.00  ?   Pack years: 3.25  ?  Types: Cigarettes  ? Smokeless tobacco: Never  ?Vaping Use  ? Vaping Use: Never used  ?Substance Use Topics  ? Alcohol use: Not Currently  ?  Comment: 12/06/2018  ? Drug use: Not Currently  ?  Types: Cocaine  ?  Comment: Not currently  ? ? ? ?Allergies   ?Levaquin [levofloxacin in d5w] ? ? ?Review of Systems ?Review of Systems ?Per HPI ? ?Physical Exam ?Triage Vital Signs ?ED Triage Vitals [04/27/21 1515]  ?Enc Vitals Group  ?   BP (!) 144/85  ?   Pulse Rate (!) 101  ?   Resp 18  ?   Temp 98 ?F (36.7 ?C)  ?   Temp Source Oral  ?   SpO2 97 %  ?   Weight 165 lb 9.1 oz (75.1 kg)  ?   Height 5\' 4"  (1.626 m)  ?   Head Circumference   ?   Peak Flow   ?   Pain Score 0  ?   Pain Loc   ?   Pain Edu?   ?   Excl. in [place]?   ? ?No data found. ? ?Updated Vital Signs ?BP (!) 144/85 (BP Location: Right Arm)   Pulse (!) 101   Temp 98 ?F (36.7 ?C) (Oral)   Resp 18   Ht 5\' 4"  (1.626 m)   Wt 165 lb 9.1 oz (75.1 kg)   LMP  (LMP Unknown)   SpO2 97%   Breastfeeding No   BMI 28.42 kg/m?  ? ?Visual Acuity ?Right Eye Distance:   ?Left Eye Distance:   ?Bilateral Distance:   ? ?Right Eye Near:   ?Left Eye Near:    ?Bilateral Near:    ? ?Physical Exam ?Constitutional:   ?   General: She is not in acute distress. ?   Appearance: Normal appearance. She is not toxic-appearing or diaphoretic.  ?HENT:  ?   Head: Normocephalic and atraumatic.  ?Eyes:  ?   Extraocular Movements: Extraocular movements intact.  ?   Conjunctiva/sclera: Conjunctivae normal.  ?Cardiovascular:  ?   Rate and Rhythm: Normal rate and regular rhythm.  ?   Pulses: Normal pulses.  ?   Heart sounds: Normal heart sounds.  ?Pulmonary:  ?   Effort: Pulmonary effort is normal. No respiratory distress.  ?   Breath sounds:  Normal breath sounds.  ?Abdominal:  ?   General: Bowel sounds are normal. There is no distension.  ?   Palpations: Abdomen is soft.  ?   Tenderness: There is no abdominal tenderness.  ?Genitourinary: ?   Comments: Deferred with shared  decision making.  Self swab performed. ?Neurological:  ?   General: No focal deficit present.  ?   Mental Status: She is alert and oriented to person, place, and time. Mental status is at baseline.  ?Psychiatric:     ?   Mood and Affect: Mood normal.     ?   Behavior: Behavior normal.     ?   Thought Content: Thought content normal.     ?   Judgment: Judgment normal.  ? ? ? ?UC Treatments / Results  ?Labs ?(all labs ordered are listed, but only abnormal results are displayed) ?Labs Reviewed  ?POCT URINALYSIS DIP (MANUAL ENTRY) - Abnormal; Notable for the following components:  ?    Result Value  ? Spec Grav, UA >=1.030 (*)   ? Blood, UA trace-intact (*)   ? Leukocytes, UA Small (1+) (*)   ? All other components within normal limits  ?POCT URINE PREGNANCY - Abnormal; Notable for the following components:  ? Preg Test, Ur Positive (*)   ? All other components within normal limits  ?URINE CULTURE  ?CERVICOVAGINAL ANCILLARY ONLY  ? ? ?EKG ? ? ?Radiology ?No results found. ? ?Procedures ?Procedures (including critical care time) ? ?Medications Ordered in UC ?Medications - No data to display ? ?Initial Impression / Assessment and Plan / UC Course  ?I have reviewed the triage vital signs and the nursing notes. ? ?Pertinent labs & imaging results that were available during my care of the patient were reviewed by me and considered in my medical decision making (see chart for details). ? ?  ? ?Pregnancy test was positive.  Patient was visibly upset after learning pregnancy test result.  She was advised to follow-up with OB/GYN for further evaluation and management and start taking prenatal vitamins.  There are also a small amount of leukocytes on urinalysis which could indicate urinary tract infection, although leukocytes are sometimes normal in pregnancy and patient was advised of this.  Will start on amoxicillin as this is safe with pregnancy and send urine culture.  Cervicovaginal swab also pending due to patient's concern  for STDs and vaginal discharge. Vaginal discharge may be normal early pregnancy symptom. Will await results for any further treatment.  Patient appears to be having normal early pregnancy symptoms so do not think t

## 2021-04-27 NOTE — ED Triage Notes (Signed)
Patient c/o dysuria, dark colored urine and a fishy odor x 3 weeks.  Patient is having vaginal discharge, new sex partner. ?

## 2021-04-28 LAB — CERVICOVAGINAL ANCILLARY ONLY
Bacterial Vaginitis (gardnerella): POSITIVE — AB
Candida Glabrata: NEGATIVE
Candida Vaginitis: NEGATIVE
Chlamydia: POSITIVE — AB
Comment: NEGATIVE
Comment: NEGATIVE
Comment: NEGATIVE
Comment: NEGATIVE
Comment: NEGATIVE
Comment: NORMAL
Neisseria Gonorrhea: NEGATIVE
Trichomonas: POSITIVE — AB

## 2021-04-28 LAB — URINE CULTURE: Culture: NO GROWTH

## 2021-04-29 ENCOUNTER — Telehealth (HOSPITAL_COMMUNITY): Payer: Self-pay | Admitting: Emergency Medicine

## 2021-04-29 MED ORDER — METRONIDAZOLE 500 MG PO TABS: 500.0000 mg | ORAL_TABLET | Freq: Two times a day (BID) | ORAL | 0 refills | Status: DC

## 2021-04-29 MED ORDER — AZITHROMYCIN 250 MG PO TABS: 1000.0000 mg | ORAL_TABLET | Freq: Every day | ORAL | 0 refills | Status: AC

## 2021-05-02 ENCOUNTER — Telehealth: Payer: Self-pay | Admitting: Emergency Medicine

## 2021-05-02 MED ORDER — METRONIDAZOLE 500 MG PO TABS: 500.0000 mg | ORAL_TABLET | Freq: Two times a day (BID) | ORAL | 0 refills | Status: DC

## 2021-05-25 ENCOUNTER — Ambulatory Visit (INDEPENDENT_AMBULATORY_CARE_PROVIDER_SITE_OTHER): Payer: Medicaid Other

## 2021-05-25 DIAGNOSIS — Z3480 Encounter for supervision of other normal pregnancy, unspecified trimester: Secondary | ICD-10-CM | POA: Insufficient documentation

## 2021-05-25 DIAGNOSIS — Z3689 Encounter for other specified antenatal screening: Secondary | ICD-10-CM

## 2021-05-25 DIAGNOSIS — O0990 Supervision of high risk pregnancy, unspecified, unspecified trimester: Secondary | ICD-10-CM | POA: Insufficient documentation

## 2021-05-25 NOTE — Initial Assessments (Signed)
New OB Intake ? ?I connected with  Erica [name] on 05/25/21 at  2:15 PM EDT by telephone Telephone Visit and verified that I am speaking with the correct person using two identifiers. Nurse is located at [facility] and pt is located at home. ? ?I explained I am completing New OB Intake today. We discussed her EDD of 12/27/2021 that is based on LMP of approx. 03/22/2021. Pt is [id number]. I reviewed her allergies, medications, Medical/Surgical/OB history, and appropriate screenings. Based on history, this is a/an  pregnancy complicated by multigravida and age . ? ?Patient Active Problem List  ? Diagnosis Date Noted  ? Supervision of other normal pregnancy, antepartum 05/25/2021  ? 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 History of substance abuse ([place]) 03/23/2019  ? ? ?Concerns addressed today ?Pt is concerned about having been pregnancy so many time and 41yr old.  Is also interested in paternity testing as she doesn't know who the FOB is.  Pt states she tripped and fell about two weeks ago; no spotting or bleedingl ? ?Delivery Plans:  ?Plans to deliver at [facility]  ? ? [place] ?Explained first scheduled [place] will be scheduled at her first visit with provider. ? ?Labs ?Pt aware NOB labs will be drawn at first visit with provider. ? ?Patient has not covid vaccine.  States she has had covid. ? ?Social Determinants of Health ?Food Insecurity: Patient denies food insecurity.  ?Transportation: Patient expressed transportation needs. Transportation Services reviewed with patient; patient registered and phone number provided for patient to schedule rides.  Pt states she has used this service before. ? ?Placed OB Box on problem list and updated ? ?First visit review ?I reviewed new OB appt with pt. I explained she will have a pelvic exam, ob bloodwork with genetic screening, and PAP smear. Explained pt will be seen by Rod Can, CNM at first visit; encounter routed to appropriate provider ?    ?Erica [name], [facility]) ?05/25/2021  2:50 PM  ?

## 2021-06-08 ENCOUNTER — Encounter: Payer: Medicaid Other | Admitting: Advanced Practice Midwife

## 2021-06-23 ENCOUNTER — Encounter: Payer: Medicaid Other | Admitting: Licensed Practical Nurse

## 2021-08-27 ENCOUNTER — Telehealth: Payer: Self-pay

## 2021-08-27 NOTE — Telephone Encounter (Signed)
Left message for pt to call office back regarding message left w/ answering service.  

## 2021-10-01 ENCOUNTER — Ambulatory Visit (HOSPITAL_COMMUNITY)
Admission: RE | Admit: 2021-10-01 | Discharge: 2021-10-01 | Disposition: A | Discharge status: Home / Self Care | Payer: Medicaid Other | Source: Ambulatory Visit | Attending: Emergency Medicine | Admitting: Emergency Medicine

## 2021-10-01 ENCOUNTER — Encounter (HOSPITAL_COMMUNITY): Payer: Self-pay

## 2021-10-01 VITALS — BP 128/79 | HR 95 | Temp 97.9°F | Resp 17

## 2021-10-01 DIAGNOSIS — O23599 Infection of other part of genital tract in pregnancy, unspecified trimester: Secondary | ICD-10-CM | POA: Insufficient documentation

## 2021-10-01 DIAGNOSIS — L259 Unspecified contact dermatitis, unspecified cause: Secondary | ICD-10-CM | POA: Diagnosis not present

## 2021-10-01 DIAGNOSIS — N760 Acute vaginitis: Secondary | ICD-10-CM | POA: Insufficient documentation

## 2021-10-01 DIAGNOSIS — N3000 Acute cystitis without hematuria: Secondary | ICD-10-CM | POA: Diagnosis not present

## 2021-10-01 DIAGNOSIS — M545 Low back pain, unspecified: Secondary | ICD-10-CM | POA: Diagnosis present

## 2021-10-01 DIAGNOSIS — Z3A00 Weeks of gestation of pregnancy not specified: Secondary | ICD-10-CM | POA: Diagnosis not present

## 2021-10-01 DIAGNOSIS — O2310 Infections of bladder in pregnancy, unspecified trimester: Secondary | ICD-10-CM | POA: Diagnosis not present

## 2021-10-01 DIAGNOSIS — O99330 Smoking (tobacco) complicating pregnancy, unspecified trimester: Secondary | ICD-10-CM | POA: Insufficient documentation

## 2021-10-01 DIAGNOSIS — O34219 Maternal care for unspecified type scar from previous cesarean delivery: Secondary | ICD-10-CM | POA: Diagnosis not present

## 2021-10-01 DIAGNOSIS — O0940 Supervision of pregnancy with grand multiparity, unspecified trimester: Secondary | ICD-10-CM | POA: Diagnosis not present

## 2021-10-01 DIAGNOSIS — O99719 Diseases of the skin and subcutaneous tissue complicating pregnancy, unspecified trimester: Secondary | ICD-10-CM

## 2021-10-01 DIAGNOSIS — F17210 Nicotine dependence, cigarettes, uncomplicated: Secondary | ICD-10-CM | POA: Diagnosis not present

## 2021-10-01 DIAGNOSIS — Z3490 Encounter for supervision of normal pregnancy, unspecified, unspecified trimester: Secondary | ICD-10-CM | POA: Insufficient documentation

## 2021-10-01 LAB — POCT URINALYSIS DIPSTICK, ED / UC
Bilirubin Urine: NEGATIVE
Glucose, UA: NEGATIVE mg/dL
Hgb urine dipstick: NEGATIVE
Nitrite: NEGATIVE
Protein, ur: 30 mg/dL — AB
Specific Gravity, Urine: 1.025 (ref 1.005–1.030)
Urobilinogen, UA: 1 mg/dL (ref 0.0–1.0)
pH: 6.5 (ref 5.0–8.0)

## 2021-10-01 MED ORDER — TRIAMCINOLONE ACETONIDE 0.025 % EX OINT: 1.0000 | TOPICAL_OINTMENT | Freq: Two times a day (BID) | CUTANEOUS | 0 refills | Status: DC

## 2021-10-01 MED ORDER — AMOXICILLIN-POT CLAVULANATE 875-125 MG PO TABS: 1.0000 | ORAL_TABLET | Freq: Two times a day (BID) | ORAL | 0 refills | Status: DC

## 2021-10-01 NOTE — ED Triage Notes (Signed)
Reports urine is dark for about 2 weeks. Denies drinking a lot of water.   Is pregnant but unsure how far along. Has had no prenatal care. Can feel baby move around. Trying to take OTC vitamins.   Pt having foul odor and discharge for about a week as well.

## 2021-10-01 NOTE — Discharge Instructions (Addendum)
Take antibiotics as prescribed for UTI. Keep hydrated.  Swabs sent out and we will contact you with results and send in any needed treatment at that time.  Use cream as prescribed to help with rash. Highly recommend follow-up with OB for routine prenatal care as well as if any urinary or vaginal symptoms persist.

## 2021-10-01 NOTE — ED Provider Notes (Signed)
[facility]    [name]: [id number] Arrival date & time: 10/01/21  1632      History   Chief Complaint Chief Complaint  Patient presents with   appt 430    HPI Erica [name] is a 41 y.o. female.   Patient presents with concerns of possible UTI and STI. She reports she has had some urinary discomfort for the past 2 weeks but mainly that she has urgency with little actually produced when she tries to urinate. She also reports foul smelling grey/white vaginal discharge for about a week and vaginal irritation. She states she thinks she has trich and had similar symptoms in the past with it. The patient is currently pregnant, about 6 months along (she does not know exact dates/weeks) and admits to inconsistent prenatal care. She reports some bilateral low back pain but denies nausea, vomiting, fever, or abdominal pain.  The patient also reports a red rash on her right forearm for about a week. She states it is itchy at times. She has some similar rash on her other arm from a bangle she wears that she knows she is allergic to. She has not tried anything for her rash and denies any known new soaps or lotions or other products.   The history is provided by the patient.    Past Medical History:  Diagnosis Date   Abnormal Pap smear    Anemia    Anxiety    as teen   Anxiety    Depression    as teen   GERD (gastroesophageal reflux disease)    Headache(784.0)    History of inadequate prenatal care (no care x8 weeks) 05/23/2019   Feb 26 - May 22, 2019   History of successful vaginal birth after cesarean, currently pregnant 05/19/2011   Desires repeat c/s and BTL.   No pertinent past medical history    Normal labor and delivery    Pneumonia    Postpartum depression    Pregnancy induced hypertension    Preterm labor    Preterm uterine contractions    Term pregnancy 08/18/2019   Tobacco use in pregnancy, childbirth, or the puerperium, antepartum 03/23/2019   03/23/19: smokes 5-8 cpd,  pt's stated goal is to reduce to 5 cpd    Vaginal birth after cesarean section 08/20/2019   Vaginal Pap smear, abnormal    VBAC, delivered, current hospitalization 05/19/2011    Patient Active Problem List   Diagnosis Date Noted   Supervision of other normal pregnancy, antepartum 05/25/2021   Supervision of high risk pregnancy, antepartum 05/25/2021   [place] multipara G10 [id number] 03/23/2019   History of substance abuse ([facility]) 03/23/2019    Past Surgical History:  Procedure Laterality Date   APPENDECTOMY     CESAREAN SECTION  2008   COLPOSCOPY     DILATION AND CURETTAGE OF UTERUS  2010   LAPAROSCOPIC APPENDECTOMY N/A 07/11/2016   Procedure: APPENDECTOMY LAPAROSCOPIC;  Surgeon: Ricarda Frame, MD;  Location: [facility] ORS;  Service: General;  Laterality: N/A;   WISDOM TOOTH EXTRACTION      OB History     Gravida  11   Para  7   Term  5   Preterm  2   AB  3   Living  7      SAB  2   IAB  0   Ectopic  1   Multiple  0   Live Births  7  Home Medications    Prior to Admission medications   Medication Sig Start Date End Date Taking? Authorizing Provider  amoxicillin-clavulanate (AUGMENTIN) 875-125 MG tablet Take 1 tablet by mouth every 12 (twelve) hours. 10/01/21  Yes Wilson Dusenbery L, PA  triamcinolone (KENALOG) 0.025 % ointment Apply 1 Application topically 2 (two) times daily. 10/01/21  Yes Deloris Moger L, PA  fexofenadine-pseudoephedrine (ALLEGRA-D) 60-120 MG 12 hr tablet Take 1 tablet by mouth 2 (two) times daily. Patient not taking: Reported on 05/25/2021 01/25/20   Joni Reining, PA-C    Family History Family History  Problem Relation Age of Onset   Hypertension Mother    Seizures Mother    Hypertension Maternal Grandmother    Arthritis Maternal Grandmother    Cancer Maternal Grandmother        breast   Stroke Maternal Grandfather    Seizures Brother    Birth defects Daughter        hole in heart   Seizures Maternal Aunt    Seizures Brother     Seizures Cousin    Seizures Cousin    Mental retardation Cousin     Social History Social History   Tobacco Use   Smoking status: Every Day    Packs/day: 0.25    Years: 13.00    Total pack years: 3.25    Types: Cigarettes   Smokeless tobacco: Never  Vaping Use   Vaping Use: Never used  Substance Use Topics   Alcohol use: Not Currently    Comment: 12/06/2018   Drug use: Not Currently    Types: Cocaine    Comment: Not currently     Allergies   Levaquin [levofloxacin in d5w]   Review of Systems Review of Systems  Constitutional:  Negative for fever.  Respiratory:  Negative for shortness of breath.   Cardiovascular:  Negative for chest pain.  Gastrointestinal:  Negative for abdominal pain, nausea and vomiting.  Genitourinary:  Positive for dysuria, urgency and vaginal discharge. Negative for hematuria.  Skin:  Positive for rash.  Neurological:  Negative for dizziness and headaches.     Physical Exam Triage Vital Signs ED Triage Vitals  Enc Vitals Group     BP 10/01/21 1644 128/79     Pulse Rate 10/01/21 1644 95     Resp 10/01/21 1644 17     Temp 10/01/21 1644 97.9 F (36.6 C)     Temp src --      SpO2 10/01/21 1644 96 %     Weight --      Height --      Head Circumference --      Peak Flow --      Pain Score 10/01/21 1642 0     Pain Loc --      Pain Edu? --      Excl. in [place]? --    No data found.  Updated Vital Signs BP 128/79 (BP Location: Right Arm)   Pulse 95   Temp 97.9 F (36.6 C)   Resp 17   LMP 03/22/2021 (Approximate)   SpO2 96%   Visual Acuity Right Eye Distance:   Left Eye Distance:   Bilateral Distance:    Right Eye Near:   Left Eye Near:    Bilateral Near:     Physical Exam Vitals and nursing note reviewed.  Constitutional:      General: She is not in acute distress. Eyes:     Pupils: Pupils are equal, round, and reactive to  light.  Cardiovascular:     Rate and Rhythm: Normal rate and regular rhythm.     Heart sounds:  Normal heart sounds.  Pulmonary:     Effort: Pulmonary effort is normal.     Breath sounds: Normal breath sounds.  Abdominal:     Tenderness: There is no right CVA tenderness, left CVA tenderness or guarding.     Comments: Gravid. Reports pressure/mild discomfort with suprapubic palpation. No other abdominal tenderness.   Skin:    Comments: Area of erythematous papules and macules to right distal volar forearm. Nontender. No vesicles, pustules. Smaller similar rash to left forearm around bangle bracelet. No other rash noted.   Neurological:     Mental Status: She is alert.  Psychiatric:        Mood and Affect: Mood normal.      UC Treatments / Results  Labs (all labs ordered are listed, but only abnormal results are displayed) Labs Reviewed  POCT URINALYSIS DIPSTICK, ED / UC - Abnormal; Notable for the following components:      Result Value   Ketones, ur TRACE (*)    Protein, ur 30 (*)    Leukocytes,Ua SMALL (*)    All other components within normal limits  URINE CULTURE  CERVICOVAGINAL ANCILLARY ONLY    EKG   Radiology No results found.  Procedures Procedures (including critical care time)  Medications Ordered in UC Medications - No data to display  Initial Impression / Assessment and Plan / UC Course  I have reviewed the triage vital signs and the nursing notes.  Pertinent labs & imaging results that were available during my care of the patient were reviewed by me and considered in my medical decision making (see chart for details).    U/A indicates possible UTI, empiric tx while awaiting culture results.   Discussed with Dr. Leonides Grills who recommended awaiting swab results and hold off on any empiric treatment for the vaginal discharge due to pregnant status. Best to avoid unneeded antibiotic exposure and to wait for swab results to ensure appropriate medication for any infection.  Rash consistent with contact dermatitis, possible from other jewelry - topical  steroid to reduce reaction.  Strongly encouraged patient to follow-up with OB for routine care as well as if persistent symptoms. ER precautions discussed.  E/M: 3 acute uncomplicated illness, 3 data (UA, vaginitis, Ucx), moderate risk due to prescription management   Final Clinical Impressions(s) / UC Diagnoses   Final diagnoses:  Acute cystitis during pregnancy, antepartum  Pregnancy, unspecified gestational age  Acute vaginitis  Contact dermatitis, unspecified contact dermatitis type, unspecified trigger     Discharge Instructions      Take antibiotics as prescribed for UTI. Keep hydrated.  Swabs sent out and we will contact you with results and send in any needed treatment at that time.  Use cream as prescribed to help with rash. Highly recommend follow-up with OB for routine prenatal care as well as if any urinary or vaginal symptoms persist.      ED Prescriptions     Medication Sig Dispense Auth. Provider   amoxicillin-clavulanate (AUGMENTIN) 875-125 MG tablet Take 1 tablet by mouth every 12 (twelve) hours. 14 tablet Diona Peregoy L, PA   triamcinolone (KENALOG) 0.025 % ointment Apply 1 Application topically 2 (two) times daily. 30 g Vallery Sa, Cydney Alvarenga L, [place]      PDMP not reviewed this encounter.   Estanislado Pandy, [place] 10/01/21 1726

## 2021-10-02 ENCOUNTER — Telehealth (HOSPITAL_COMMUNITY): Payer: Self-pay | Admitting: Emergency Medicine

## 2021-10-02 LAB — CERVICOVAGINAL ANCILLARY ONLY
Bacterial Vaginitis (gardnerella): NEGATIVE
Candida Glabrata: POSITIVE — AB
Candida Vaginitis: POSITIVE — AB
Chlamydia: NEGATIVE
Comment: NEGATIVE
Comment: NEGATIVE
Comment: NEGATIVE
Comment: NEGATIVE
Comment: NEGATIVE
Comment: NORMAL
Neisseria Gonorrhea: NEGATIVE
Trichomonas: POSITIVE — AB

## 2021-10-02 MED ORDER — CLOTRIMAZOLE 1 % VA CREA: 1.0000 | TOPICAL_CREAM | Freq: Every day | VAGINAL | 0 refills | Status: DC

## 2021-10-02 MED ORDER — METRONIDAZOLE 500 MG PO TABS: 500.0000 mg | ORAL_TABLET | Freq: Two times a day (BID) | ORAL | 0 refills | Status: DC

## 2021-10-03 LAB — URINE CULTURE

## 2021-11-14 ENCOUNTER — Ambulatory Visit
Admission: RE | Admit: 2021-11-14 | Discharge: 2021-11-14 | Disposition: A | Discharge status: Home / Self Care | Payer: Medicaid Other | Source: Ambulatory Visit | Attending: Urgent Care | Admitting: Urgent Care

## 2021-11-14 VITALS — BP 130/80 | HR 120 | Temp 97.9°F | Resp 18

## 2021-11-14 DIAGNOSIS — J329 Chronic sinusitis, unspecified: Secondary | ICD-10-CM | POA: Diagnosis present

## 2021-11-14 DIAGNOSIS — R399 Unspecified symptoms and signs involving the genitourinary system: Secondary | ICD-10-CM

## 2021-11-14 DIAGNOSIS — Z113 Encounter for screening for infections with a predominantly sexual mode of transmission: Secondary | ICD-10-CM | POA: Diagnosis present

## 2021-11-14 LAB — POCT URINALYSIS DIP (MANUAL ENTRY)
Bilirubin, UA: NEGATIVE
Blood, UA: NEGATIVE
Glucose, UA: NEGATIVE mg/dL
Leukocytes, UA: NEGATIVE
Nitrite, UA: NEGATIVE
Protein Ur, POC: 30 mg/dL — AB
Spec Grav, UA: 1.02 (ref 1.010–1.025)
Urobilinogen, UA: 0.2 E.U./dL
pH, UA: 7 (ref 5.0–8.0)

## 2021-11-14 MED ORDER — PREDNISONE 10 MG (21) PO TBPK: ORAL_TABLET | Freq: Every day | ORAL | 0 refills | Status: DC

## 2021-11-14 MED ORDER — FLUTICASONE PROPIONATE 50 MCG/ACT NA SUSP: 2.0000 | Freq: Every day | NASAL | 0 refills | Status: DC

## 2021-11-14 NOTE — Discharge Instructions (Addendum)
Your urine was negative for signs of infection.  The results of your vaginal swab will be available in the next few days and someone will contact you to arrange treatment if there is a positive result.  Recommend use of Flonase nasal steroid spray for relief of your nasal congestion and sinus inflammation.  Please follow-up with and discuss your symptoms with your OB/GYN provider on Monday.

## 2021-11-14 NOTE — ED Triage Notes (Addendum)
Pt presents to uc with co of sinus pressure, congestion, and yellow phlegm for one week no otc medications due to pregnancy. 33 weeks.  pt reports she also has uti symptoms but was treated one month ago and is concerned she may have not taken medication correctly since symptoms persist. Pt also treated for bv anf tric at this visit but also did not complete the antibiotics concerned not treated no current itchiness or discharge   Pt also reports indigestion and loose bowels since yesterday.   Pt due to see obgyn Monday.

## 2021-11-14 NOTE — ED Provider Notes (Addendum)
[name]    [name]: [id number] Arrival date & time: 11/14/21  1051      History   Chief Complaint Chief Complaint  Patient presents with   Cough    Congestion severely as well as coughing up green [place]ough sinus infection I can smell the infection when I breath also left ear pain and I'm currently about [redacted] weeks pregnant - Entered by patient   Facial Pain   Urinary Frequency    HPI Erica [name] is a 41 y.o. female.   HPI  Patient presents to urgent care with symptoms x1 week.  Symptoms include sinus pressure, congestion, production of yellow sputum.  She has not been using OTC medications due to pregnancy (33 weeks).  Patient reports she also has UTI symptoms but was treated 1 month ago and is concerned she may not of taking medication correctly since symptoms persist.  She was also treated for BV and did not complete medications.  She endorses no current itchiness or discharge.  She reports indigestion and loose bowels since yesterday.  She is scheduled to see her OB/GYN on Monday.  Patient was seen on 10/01/2021 and prescribed Augmentin.  She states she took this medication inconsistently.  Vaginal swab on that day was positive for trichomoniasis, and Candida for which she was prescribed clotrimazole 1% applicator vaginally and metronidazole 500 mg p.o. twice daily.  Past Medical History:  Diagnosis Date   Abnormal Pap smear    Anemia    Anxiety    as teen   Anxiety    Depression    as teen   GERD (gastroesophageal reflux disease)    Headache(784.0)    History of inadequate prenatal care (no care x8 weeks) 05/23/2019   Feb 26 - May 22, 2019   History of successful vaginal birth after cesarean, currently pregnant 05/19/2011   Desires repeat c/s and BTL.   No pertinent past medical history    Normal labor and delivery    Pneumonia    Postpartum depression    Pregnancy induced hypertension    Preterm labor    Preterm uterine contractions    Term  pregnancy 08/18/2019   Tobacco use in pregnancy, childbirth, or the puerperium, antepartum 03/23/2019   03/23/19: smokes 5-8 cpd, pt's stated goal is to reduce to 5 cpd    Vaginal birth after cesarean section 08/20/2019   Vaginal Pap smear, abnormal    VBAC, delivered, current hospitalization 05/19/2011    Patient Active Problem List   Diagnosis Date Noted   Supervision of other normal pregnancy, antepartum 05/25/2021   Supervision of high risk pregnancy, antepartum 05/25/2021   Grand multipara G10 P4236 03/23/2019   History of substance abuse (HCC) 03/23/2019    Past Surgical History:  Procedure Laterality Date   APPENDECTOMY     CESAREAN SECTION  2008   COLPOSCOPY     DILATION AND CURETTAGE OF UTERUS  2010   LAPAROSCOPIC APPENDECTOMY N/A 07/11/2016   Procedure: APPENDECTOMY LAPAROSCOPIC;  Surgeon: Ricarda Frame, MD;  Location: [facility] ORS;  Service: General;  Laterality: N/A;   WISDOM TOOTH EXTRACTION      OB History     Gravida  11   Para  7   Term  5   Preterm  2   AB  3   Living  7      SAB  2   IAB  0   Ectopic  1   Multiple  0   Live Births  7            Home Medications    Prior to Admission medications   Medication Sig Start Date End Date Taking? Authorizing Provider  amoxicillin-clavulanate (AUGMENTIN) 875-125 MG tablet Take 1 tablet by mouth every 12 (twelve) hours. 10/01/21   Abner Greenspan, Amy L, PA  clotrimazole (GYNE-LOTRIMIN) 1 % vaginal cream Place 1 Applicatorful vaginally at bedtime. 10/02/21   Lamptey, Myrene Galas, MD  fexofenadine-pseudoephedrine (ALLEGRA-D) 60-120 MG 12 hr tablet Take 1 tablet by mouth 2 (two) times daily. Patient not taking: Reported on 05/25/2021 01/25/20   Sable Feil, PA-C  metroNIDAZOLE (FLAGYL) 500 MG tablet Take 1 tablet (500 mg total) by mouth 2 (two) times daily. 10/02/21   Lamptey, Myrene Galas, MD  triamcinolone (KENALOG) 0.025 % ointment Apply 1 Application topically 2 (two) times daily. 10/01/21   Delsa Sale, PA     Family History Family History  Problem Relation Age of Onset   Hypertension Mother    Seizures Mother    Hypertension Maternal Grandmother    Arthritis Maternal Grandmother    Cancer Maternal Grandmother        breast   Stroke Maternal Grandfather    Seizures Brother    Birth defects Daughter        hole in heart   Seizures Maternal Aunt    Seizures Brother    Seizures Cousin    Seizures Cousin    Mental retardation Cousin     Social History Social History   Tobacco Use   Smoking status: Every Day    Packs/day: 1.00    Years: 13.00    Total pack years: 13.00    Types: Cigarettes   Smokeless tobacco: Never  Vaping Use   Vaping Use: Never used  Substance Use Topics   Alcohol use: Not Currently    Comment: 12/06/2018   Drug use: Not Currently    Types: Cocaine    Comment: Not currently     Allergies   Levaquin [levofloxacin in d5w]   Review of Systems Review of Systems   Physical Exam Triage Vital Signs ED Triage Vitals  Enc Vitals Group     BP 11/14/21 1123 130/80     Pulse Rate 11/14/21 1123 (!) 120     Resp 11/14/21 1123 18     Temp 11/14/21 1123 97.9 F (36.6 C)     Temp src --      SpO2 11/14/21 1123 98 %     Weight --      Height --      Head Circumference --      Peak Flow --      Pain Score 11/14/21 1118 4     Pain Loc --      Pain Edu? --      Excl. in [place]? --    No data found.  Updated Vital Signs BP 130/80   Pulse (!) 120   Temp 97.9 F (36.6 C)   Resp 18   LMP 03/22/2021 (Approximate)   SpO2 98%   Visual Acuity Right Eye Distance:   Left Eye Distance:   Bilateral Distance:    Right Eye Near:   Left Eye Near:    Bilateral Near:     Physical Exam   UC Treatments / Results  Labs (all labs ordered are listed, but only abnormal results are displayed) Labs Reviewed - No data to display  EKG   Radiology No results found.  Procedures Procedures (including  critical care time)  Medications Ordered in  UC Medications - No data to display  Initial Impression / Assessment and Plan / UC Course  I have reviewed the triage vital signs and the nursing notes.  Pertinent labs & imaging results that were available during my care of the patient were reviewed by me and considered in my medical decision making (see chart for details).   UA is negative for signs of infection.  Cervical swab obtained and pending.  Respiratory symptoms less than 1 week and viral most likely etiology.  Will recommend Flonase nasal spray.  Discussed prescribing prednisone taper given her complaint severe symptoms however declined. Asking patient to review medications prescribed today with her OB/GYN at her visit Monday.   Final Clinical Impressions(s) / UC Diagnoses   Final diagnoses:  None   Discharge Instructions   None    ED Prescriptions   None    PDMP not reviewed this encounter.   Charma Igo, FNP 11/14/21 1215    Charma Igo, FNP 11/14/21 1219

## 2021-11-16 LAB — CERVICOVAGINAL ANCILLARY ONLY
Bacterial Vaginitis (gardnerella): NEGATIVE
Candida Glabrata: NEGATIVE
Candida Vaginitis: POSITIVE — AB
Chlamydia: NEGATIVE
Comment: NEGATIVE
Comment: NEGATIVE
Comment: NEGATIVE
Comment: NEGATIVE
Comment: NEGATIVE
Comment: NORMAL
Neisseria Gonorrhea: NEGATIVE
Trichomonas: NEGATIVE

## 2021-11-16 LAB — OB RESULTS CONSOLE HGB/HCT, BLOOD
HCT: 36 (ref 29–41)
Hemoglobin: 11.6

## 2021-11-16 LAB — OB RESULTS CONSOLE RPR: RPR: NONREACTIVE

## 2021-11-16 LAB — OB RESULTS CONSOLE PLATELET COUNT: Platelets: 339

## 2021-11-18 ENCOUNTER — Ambulatory Visit (INDEPENDENT_AMBULATORY_CARE_PROVIDER_SITE_OTHER): Payer: Medicaid Other | Admitting: Family Medicine

## 2021-11-18 ENCOUNTER — Other Ambulatory Visit: Payer: Self-pay

## 2021-11-18 ENCOUNTER — Encounter: Payer: Self-pay | Admitting: Family Medicine

## 2021-11-18 VITALS — BP 129/83 | HR 105 | Wt 155.5 lb

## 2021-11-18 DIAGNOSIS — F1410 Cocaine abuse, uncomplicated: Secondary | ICD-10-CM | POA: Diagnosis not present

## 2021-11-18 DIAGNOSIS — Z3009 Encounter for other general counseling and advice on contraception: Secondary | ICD-10-CM | POA: Diagnosis not present

## 2021-11-18 DIAGNOSIS — O0990 Supervision of high risk pregnancy, unspecified, unspecified trimester: Secondary | ICD-10-CM | POA: Insufficient documentation

## 2021-11-18 DIAGNOSIS — Z98891 History of uterine scar from previous surgery: Secondary | ICD-10-CM | POA: Diagnosis not present

## 2021-11-18 DIAGNOSIS — Z3A35 35 weeks gestation of pregnancy: Secondary | ICD-10-CM

## 2021-11-18 LAB — CYTOLOGY - PAP: Pap: ABNORMAL — AB

## 2021-11-18 NOTE — Progress Notes (Signed)
Subjective:   Erica [name] is a 41 y.o. [id number] at [redacted]w[redacted]d by LMP being seen today for her first obstetrical visit.  Her obstetrical history is significant for  substance use disorder, AMA, grand multipara . Patient does not intend to breast feed. Pregnancy history fully reviewed.  Patient reports no complaints.  HISTORY: OB History  Gravida Para Term Preterm AB Living  12 7 5 2 3 7   SAB IAB Ectopic Multiple Live Births  2 0 1 0 7    # Outcome Date GA Lbr Len/2nd Weight Sex Delivery Anes PTL Lv  12 Current           11 Term 08/18/19 [redacted]w[redacted]d 08:42 / 00:16 5 lb 4 oz (2.38 kg) F VBAC EPI  LIV     Name: Entsminger,GIRL Laurena     Apgar1: 8  Apgar5: 9  10 Ectopic 2020          9 SAB 2019          8 Term 05/19/11 [redacted]w[redacted]d 04:32 / 00:05 6 lb 4 oz (2.835 kg) M Vag-Spont EPI  LIV     Birth Comments: No anomalies noted     Name: Meanor,BOY Mileidy     Apgar1: 6  Apgar5: 8  7 SAB 2011 [redacted]w[redacted]d         6 Preterm 2008 [redacted]w[redacted]d  4 lb 2 oz (1.871 kg) F CS-LTranv   LIV     Birth Comments: PIH IUGR  5 Preterm 2006 [redacted]w[redacted]d 06:00  F Vag-Spont EPI  LIV     Birth Comments: fetal blocked kidney, doesn't talk mentally handicapped, BUFA  4 Term 2005 [redacted]w[redacted]d 05:00 8 lb 14 oz (4.026 kg) M Vag-Spont EPI  LIV     Birth Comments: BUFA  3 Term 2002 [redacted]w[redacted]d 06:00 7 lb 7 oz (3.374 kg) F Vag-Spont EPI  LIV     Birth Comments: BUFA  2 Term 2000 [redacted]w[redacted]d 13:00 8 lb 12 oz (3.969 kg) F Vag-Spont EPI  LIV     Birth Comments: adopted had C diff and chicken pox during pregnancy BUFA  1 Gravida              Last pap smear: Lab Results  Component Value Date   DIAGPAP  03/19/2020    - Negative for intraepithelial lesion or malignancy (NILM)     Past Medical History:  Diagnosis Date   Abnormal Pap smear    Anemia    Anxiety    as teen   Anxiety    Depression    as teen   GERD (gastroesophageal reflux disease)    Headache(784.0)    History of inadequate prenatal care (no care x8 weeks) 05/23/2019   Feb 26 - May 22, 2019    History of successful vaginal birth after cesarean, currently pregnant 05/19/2011   Desires repeat c/s and BTL.   No pertinent past medical history    Normal labor and delivery    Pneumonia    Postpartum depression    Pregnancy induced hypertension    Preterm labor    Preterm uterine contractions    Term pregnancy 08/18/2019   Tobacco use in pregnancy, childbirth, or the puerperium, antepartum 03/23/2019   03/23/19: smokes 5-8 cpd, pt's stated goal is to reduce to 5 cpd    Vaginal birth after cesarean section 08/20/2019   Vaginal Pap smear, abnormal    VBAC, delivered, current hospitalization 05/19/2011   Past Surgical History:  Procedure Laterality Date  APPENDECTOMY     CESAREAN SECTION  2008   COLPOSCOPY     DILATION AND CURETTAGE OF UTERUS  2010   LAPAROSCOPIC APPENDECTOMY N/A 07/11/2016   Procedure: APPENDECTOMY LAPAROSCOPIC;  Surgeon: Clayburn Pert, MD;  Location: [facility] ORS;  Service: General;  Laterality: N/A;   WISDOM TOOTH EXTRACTION     Family History  Problem Relation Age of Onset   Hypertension Mother    Seizures Mother    Hypertension Maternal Grandmother    Arthritis Maternal Grandmother    Cancer Maternal Grandmother        breast   Stroke Maternal Grandfather    Seizures Brother    Birth defects Daughter        hole in heart   Seizures Maternal Aunt    Seizures Brother    Seizures Cousin    Seizures Cousin    Mental retardation Cousin    Social History   Tobacco Use   Smoking status: Every Day    Packs/day: 1.00    Years: 13.00    Total pack years: 13.00    Types: Cigarettes   Smokeless tobacco: Never  Vaping Use   Vaping Use: Never used  Substance Use Topics   Alcohol use: Not Currently    Comment: 12/06/2018   Drug use: Not Currently    Types: Cocaine    Comment: Not currently   Allergies  Allergen Reactions   Levaquin [Levofloxacin In D5w] Swelling    Lip swelling, hives, chest tightness   Current Outpatient Medications on File Prior  to Visit  Medication Sig Dispense Refill   fluticasone (FLONASE) 50 MCG/ACT nasal spray Place 2 sprays into both nostrils daily for 10 days. 1 g 0   Prenatal Vit-Fe Fumarate-FA (M-NATAL PLUS) 27-1 MG TABS Take 1 tablet by mouth daily.     terconazole (TERAZOL 7) 0.4 % vaginal cream Place 1 applicator vaginally at bedtime.     No current facility-administered medications on file prior to visit.     Exam   Vitals:   11/18/21 1615  BP: 129/83  Pulse: (!) 105  Weight: 155 lb 8 oz (70.5 kg)   Fetal Heart Rate (bpm): 138  System: General: well-developed, well-nourished female in no acute distress   Skin: normal coloration and turgor, no rashes   Neurologic: oriented, normal, negative, normal mood   Extremities: normal strength, tone, and muscle mass, ROM of all joints is normal   HEENT PERRLA, extraocular movement intact and sclera clear, anicteric   Neck supple and no masses   Respiratory:  no respiratory distress      Assessment:   Pregnancy: [id number] Patient Active Problem List   Diagnosis Date Noted   Supervision of high risk pregnancy, antepartum 11/18/2021   History of cesarean delivery 11/18/2021   History of VBAC 11/18/2021   Unwanted fertility 11/18/2021   Grand multipara G10 P4236 03/23/2019   History of substance abuse ([place]) 03/23/2019     Plan:  1. Supervision of high risk pregnancy, antepartum BP and FHR normal FH appropriate at 35 cm, reports current dating is by [place] from yesterday Initial labs drawn at health department two days prior, also had [place] at that time, will wait on results.  Continue prenatal vitamins. Problem list reviewed and updated. The nature of [place] with multiple MDs and other Advanced Practice Providers was explained to patient; also emphasized that residents, students are part of our team.  2. Cocaine use disorder [facility]) Patient admits  to long history of use and several slip ups during this  pregnancy including last week She is very tearful and terrified that she will have her baby taken away We discussed that CPS involvement is somewhat inevitable, but if we make an effort at abstinence and show normal UDS then we have a good chance of that not happening She has a stable living and financial situation UDS ordered today - [employer] ([place]), U[facility])  3. History of cesarean section X1, followed by VBAC x2 Strongly desires BTL but also very much wants to avoid a c section VBAC consent signed, elects for TOLAC  4. Unwanted fertility BTL papers signed, understands may need to be interval if she delivers prior to 30 days from now  Routine obstetric precautions reviewed. Return in about 1 week (around 11/25/2021) for [facility], ob visit.

## 2021-11-19 ENCOUNTER — Encounter (HOSPITAL_COMMUNITY): Payer: Self-pay | Admitting: Family Medicine

## 2021-11-19 ENCOUNTER — Inpatient Hospital Stay (HOSPITAL_COMMUNITY)
Admission: AD | Admit: 2021-11-19 | Discharge: 2021-11-21 | DRG: 806 | Disposition: A | Discharge status: Home / Self Care | Payer: Medicaid Other | Attending: Family Medicine | Admitting: Family Medicine

## 2021-11-19 ENCOUNTER — Encounter: Payer: Self-pay | Admitting: *Deleted

## 2021-11-19 DIAGNOSIS — Z98891 History of uterine scar from previous surgery: Secondary | ICD-10-CM

## 2021-11-19 DIAGNOSIS — O34219 Maternal care for unspecified type scar from previous cesarean delivery: Secondary | ICD-10-CM | POA: Diagnosis present

## 2021-11-19 DIAGNOSIS — F1410 Cocaine abuse, uncomplicated: Secondary | ICD-10-CM | POA: Diagnosis present

## 2021-11-19 DIAGNOSIS — Z3A36 36 weeks gestation of pregnancy: Secondary | ICD-10-CM

## 2021-11-19 DIAGNOSIS — Z23 Encounter for immunization: Secondary | ICD-10-CM | POA: Diagnosis not present

## 2021-11-19 DIAGNOSIS — F1911 Other psychoactive substance abuse, in remission: Secondary | ICD-10-CM

## 2021-11-19 DIAGNOSIS — Z641 Problems related to multiparity: Secondary | ICD-10-CM

## 2021-11-19 DIAGNOSIS — O164 Unspecified maternal hypertension, complicating childbirth: Secondary | ICD-10-CM | POA: Diagnosis present

## 2021-11-19 DIAGNOSIS — O99334 Smoking (tobacco) complicating childbirth: Secondary | ICD-10-CM | POA: Diagnosis present

## 2021-11-19 DIAGNOSIS — F17210 Nicotine dependence, cigarettes, uncomplicated: Secondary | ICD-10-CM | POA: Diagnosis present

## 2021-11-19 DIAGNOSIS — O99324 Drug use complicating childbirth: Secondary | ICD-10-CM | POA: Diagnosis present

## 2021-11-19 DIAGNOSIS — O479 False labor, unspecified: Secondary | ICD-10-CM | POA: Diagnosis present

## 2021-11-19 DIAGNOSIS — O34211 Maternal care for low transverse scar from previous cesarean delivery: Secondary | ICD-10-CM | POA: Diagnosis not present

## 2021-11-19 DIAGNOSIS — Z3009 Encounter for other general counseling and advice on contraception: Secondary | ICD-10-CM

## 2021-11-19 LAB — OB RESULTS CONSOLE GBS: GBS: POSITIVE

## 2021-11-19 LAB — TYPE AND SCREEN
ABO/RH(D): O POS
Antibody Screen: NEGATIVE

## 2021-11-19 LAB — CBC
HCT: 35 % — ABNORMAL LOW (ref 36.0–46.0)
Hemoglobin: 11.5 g/dL — ABNORMAL LOW (ref 12.0–15.0)
MCH: 30.4 pg (ref 26.0–34.0)
MCHC: 32.9 g/dL (ref 30.0–36.0)
MCV: 92.6 fL (ref 80.0–100.0)
Platelets: 288 10*3/uL (ref 150–400)
RBC: 3.78 MIL/uL — ABNORMAL LOW (ref 3.87–5.11)
RDW: 13.8 % (ref 11.5–15.5)
WBC: 11.8 10*3/uL — ABNORMAL HIGH (ref 4.0–10.5)
nRBC: 0 % (ref 0.0–0.2)

## 2021-11-19 MED ORDER — OXYCODONE-ACETAMINOPHEN 5-325 MG PO TABS: 2.0000 | ORAL_TABLET | ORAL | Status: DC | PRN

## 2021-11-19 MED ORDER — TRANEXAMIC ACID-NACL 1000-0.7 MG/100ML-% IV SOLN: 1000.0000 mg | INTRAVENOUS | Status: DC

## 2021-11-19 MED ORDER — ACETAMINOPHEN 325 MG PO TABS
650.0000 mg | ORAL_TABLET | ORAL | Status: DC | PRN
  Administered 2021-11-20: 650 mg via ORAL
  Filled 2021-11-19: qty 2

## 2021-11-19 MED ORDER — EPHEDRINE 5 MG/ML INJ: 10.0000 mg | INTRAVENOUS | Status: DC | PRN

## 2021-11-19 MED ORDER — TRANEXAMIC ACID-NACL 1000-0.7 MG/100ML-% IV SOLN
INTRAVENOUS | Status: AC
  Filled 2021-11-19: qty 100

## 2021-11-19 MED ORDER — LACTATED RINGERS IV BOLUS
1000.0000 mL | Freq: Once | INTRAVENOUS | Status: AC
  Administered 2021-11-19: 1000 mL via INTRAVENOUS

## 2021-11-19 MED ORDER — LACTATED RINGERS IV SOLN: INTRAVENOUS | Status: DC

## 2021-11-19 MED ORDER — LACTATED RINGERS IV SOLN: 500.0000 mL | Freq: Once | INTRAVENOUS | Status: DC

## 2021-11-19 MED ORDER — ONDANSETRON HCL 4 MG/2ML IJ SOLN: 4.0000 mg | Freq: Four times a day (QID) | INTRAMUSCULAR | Status: DC | PRN

## 2021-11-19 MED ORDER — LIDOCAINE HCL (PF) 1 % IJ SOLN: 30.0000 mL | INTRAMUSCULAR | Status: DC | PRN

## 2021-11-19 MED ORDER — PHENYLEPHRINE 80 MCG/ML (10ML) SYRINGE FOR IV PUSH (FOR BLOOD PRESSURE SUPPORT): 80.0000 ug | PREFILLED_SYRINGE | INTRAVENOUS | Status: DC | PRN

## 2021-11-19 MED ORDER — SODIUM CHLORIDE 0.9 % IV SOLN: 1.0000 g | INTRAVENOUS | Status: DC

## 2021-11-19 MED ORDER — FENTANYL-BUPIVACAINE-NACL 0.5-0.125-0.9 MG/250ML-% EP SOLN
12.0000 mL/h | EPIDURAL | Status: DC | PRN
  Filled 2021-11-19: qty 250

## 2021-11-19 MED ORDER — SODIUM CHLORIDE 0.9 % IV SOLN
2.0000 g | Freq: Once | INTRAVENOUS | Status: AC
  Administered 2021-11-19: 2 g via INTRAVENOUS
  Filled 2021-11-19: qty 2000

## 2021-11-19 MED ORDER — OXYCODONE-ACETAMINOPHEN 5-325 MG PO TABS: 1.0000 | ORAL_TABLET | ORAL | Status: DC | PRN

## 2021-11-19 MED ORDER — LACTATED RINGERS IV SOLN: 500.0000 mL | INTRAVENOUS | Status: DC | PRN

## 2021-11-19 MED ORDER — FENTANYL CITRATE (PF) 100 MCG/2ML IJ SOLN
100.0000 ug | Freq: Once | INTRAMUSCULAR | Status: AC
  Administered 2021-11-19: 100 ug via INTRAVENOUS
  Filled 2021-11-19: qty 2

## 2021-11-19 MED ORDER — OXYTOCIN-SODIUM CHLORIDE 30-0.9 UT/500ML-% IV SOLN
2.5000 [IU]/h | INTRAVENOUS | Status: DC
  Filled 2021-11-19: qty 500

## 2021-11-19 MED ORDER — OXYTOCIN BOLUS FROM INFUSION
333.0000 mL | Freq: Once | INTRAVENOUS | Status: AC
  Administered 2021-11-19: 333 mL via INTRAVENOUS

## 2021-11-19 MED ORDER — DIPHENHYDRAMINE HCL 50 MG/ML IJ SOLN: 12.5000 mg | INTRAMUSCULAR | Status: DC | PRN

## 2021-11-19 MED ORDER — SOD CITRATE-CITRIC ACID 500-334 MG/5ML PO SOLN: 30.0000 mL | ORAL | Status: DC | PRN

## 2021-11-19 NOTE — MAU Note (Signed)
..  Erica [name] is a 41 y.o. at [redacted]w[redacted]d here in MAU reporting: pt brought directly from registration to room d/t frequency of contractions. Denies vaginal bleeding or leaking of fluid. +FM

## 2021-11-20 ENCOUNTER — Encounter (HOSPITAL_COMMUNITY): Payer: Self-pay | Admitting: Family Medicine

## 2021-11-20 ENCOUNTER — Other Ambulatory Visit: Payer: Self-pay

## 2021-11-20 LAB — CBC
HCT: 31.3 % — ABNORMAL LOW (ref 36.0–46.0)
Hemoglobin: 10.6 g/dL — ABNORMAL LOW (ref 12.0–15.0)
MCH: 30.5 pg (ref 26.0–34.0)
MCHC: 33.9 g/dL (ref 30.0–36.0)
MCV: 90.2 fL (ref 80.0–100.0)
Platelets: 283 10*3/uL (ref 150–400)
RBC: 3.47 MIL/uL — ABNORMAL LOW (ref 3.87–5.11)
RDW: 13.9 % (ref 11.5–15.5)
WBC: 13.3 10*3/uL — ABNORMAL HIGH (ref 4.0–10.5)
nRBC: 0 % (ref 0.0–0.2)

## 2021-11-20 LAB — RAPID HIV SCREEN (HIV 1/2 AB+AG)
HIV 1/2 Antibodies: NONREACTIVE
HIV-1 P24 Antigen - HIV24: NONREACTIVE

## 2021-11-20 LAB — RPR: RPR Ser Ql: NONREACTIVE

## 2021-11-20 LAB — GLUCOSE TOLERANCE, 1 HOUR: GTT, 1 hr: 101

## 2021-11-20 LAB — HEPATITIS C ANTIBODY: HCV Ab: NONREACTIVE

## 2021-11-20 MED ORDER — DIBUCAINE (PERIANAL) 1 % EX OINT: 1.0000 | TOPICAL_OINTMENT | CUTANEOUS | Status: DC | PRN

## 2021-11-20 MED ORDER — DIPHENHYDRAMINE HCL 25 MG PO CAPS: 25.0000 mg | ORAL_CAPSULE | Freq: Four times a day (QID) | ORAL | Status: DC | PRN

## 2021-11-20 MED ORDER — ONDANSETRON HCL 4 MG PO TABS: 4.0000 mg | ORAL_TABLET | ORAL | Status: DC | PRN

## 2021-11-20 MED ORDER — HYDROXYZINE HCL 25 MG PO TABS: 25.0000 mg | ORAL_TABLET | Freq: Four times a day (QID) | ORAL | Status: DC | PRN

## 2021-11-20 MED ORDER — SENNOSIDES-DOCUSATE SODIUM 8.6-50 MG PO TABS
2.0000 | ORAL_TABLET | ORAL | Status: DC
  Administered 2021-11-20 – 2021-11-21 (×2): 2 via ORAL
  Filled 2021-11-20 (×2): qty 2

## 2021-11-20 MED ORDER — BENZOCAINE-MENTHOL 20-0.5 % EX AERO
1.0000 | INHALATION_SPRAY | CUTANEOUS | Status: DC | PRN
  Administered 2021-11-20: 1 via TOPICAL
  Filled 2021-11-20: qty 56

## 2021-11-20 MED ORDER — TETANUS-DIPHTH-ACELL PERTUSSIS 5-2.5-18.5 LF-MCG/0.5 IM SUSY: 0.5000 mL | PREFILLED_SYRINGE | Freq: Once | INTRAMUSCULAR | Status: DC

## 2021-11-20 MED ORDER — ZOLPIDEM TARTRATE 5 MG PO TABS: 5.0000 mg | ORAL_TABLET | Freq: Every evening | ORAL | Status: DC | PRN

## 2021-11-20 MED ORDER — ACETAMINOPHEN 325 MG PO TABS
650.0000 mg | ORAL_TABLET | ORAL | Status: DC | PRN
  Administered 2021-11-20 (×3): 650 mg via ORAL
  Filled 2021-11-20 (×3): qty 2

## 2021-11-20 MED ORDER — ONDANSETRON HCL 4 MG/2ML IJ SOLN: 4.0000 mg | INTRAMUSCULAR | Status: DC | PRN

## 2021-11-20 MED ORDER — IBUPROFEN 600 MG PO TABS
600.0000 mg | ORAL_TABLET | Freq: Four times a day (QID) | ORAL | Status: DC
  Administered 2021-11-20 – 2021-11-21 (×8): 600 mg via ORAL
  Filled 2021-11-20 (×9): qty 1

## 2021-11-20 MED ORDER — COCONUT OIL OIL: 1.0000 | TOPICAL_OIL | Status: DC | PRN

## 2021-11-20 MED ORDER — SIMETHICONE 80 MG PO CHEW: 80.0000 mg | CHEWABLE_TABLET | ORAL | Status: DC | PRN

## 2021-11-20 MED ORDER — OXYCODONE HCL 5 MG PO TABS
5.0000 mg | ORAL_TABLET | ORAL | Status: DC | PRN
  Administered 2021-11-20 – 2021-11-21 (×3): 5 mg via ORAL
  Filled 2021-11-20 (×3): qty 1

## 2021-11-20 MED ORDER — WITCH HAZEL-GLYCERIN EX PADS: 1.0000 | MEDICATED_PAD | CUTANEOUS | Status: DC | PRN

## 2021-11-20 MED ORDER — PRENATAL MULTIVITAMIN CH
1.0000 | ORAL_TABLET | Freq: Every day | ORAL | Status: DC
  Administered 2021-11-20 – 2021-11-21 (×2): 1 via ORAL
  Filled 2021-11-20 (×2): qty 1

## 2021-11-20 NOTE — Discharge Summary (Signed)
Postpartum Discharge Summary  Date of Service updated***     Patient Name: Erica [name] DOB: January 15, 1981 MRN: [id number]  Date of admission: 11/19/2021 Delivery date:11/19/2021  Delivering provider: Seabron Spates  Date of discharge: 11/20/2021  Admitting diagnosis: Uterine contractions during pregnancy [O47.9] Intrauterine pregnancy: [redacted]w[redacted]d    Secondary diagnosis:  Principal Problem:   Uterine contractions during pregnancy Preterm Labor [facility] Additional problems: Recent Cocaine Use                                      Intermittent Hypertension              Discharge diagnosis: Preterm Pregnancy Delivered                                              Post partum procedures:{Postpartum procedures:23558} Augmentation: AROM Complications: None  Hospital course: Onset of Labor With Vaginal Delivery      41y.o. yo [id number] admitted in Active Labor on 11/19/2021. Labor course was complicated by Rapid Labor Progressed from 3cm to Delivery in  an hour Membrane Rupture Time/Date: 11:25 PM ,11/19/2021   Delivery Method:VBAC, Spontaneous  Episiotomy: None  Lacerations:  None  Patient had a postpartum course complicated by ***.  She is ambulating, tolerating a regular diet, passing flatus, and urinating well. Patient is discharged home in stable condition on 11/20/21.  Newborn Data: Birth date:11/19/2021  Birth time:11:28 PM  Gender:Female  Living status:Living  Apgars: ,  [id number]   Magnesium Sulfate received: No BMZ received: No Rhophylac:N/A MMR:N/A T-DaP: no Flu: N/A Transfusion:{Transfusion received:30440034}  Physical exam  Vitals:   11/19/21 2205 11/19/21 2215 11/19/21 2245 11/19/21 2345  BP: 134/84 132/72 135/89 136/84  Pulse: 83 89 80 75  Resp:    16  SpO2: 100%      General: {Exam; general:21111117} Lochia: {Desc; appropriate/inappropriate:30686::"appropriate"} Uterine Fundus:  {Desc; firm/soft:30687} Incision: {Exam; incision:21111123} DVT Evaluation: {Exam; dvt:2111122} Labs: Lab Results  Component Value Date   WBC 11.8 (H) 11/19/2021   HGB 11.5 (L) 11/19/2021   HCT 35.0 (L) 11/19/2021   MCV 92.6 11/19/2021   PLT 288 11/19/2021      Latest Ref Rng & Units 03/23/2019    3:35 PM  CMP  BUN 6 - 20 mg/dL 8   Creatinine 0.57 - 1.00 mg/dL 0.51   AST 0 - 40 IU/L 24    Edinburgh Score:    05/25/2021    2:38 PM  Edinburgh Postnatal Depression Scale Screening Tool  I have been able to laugh and see the funny side of things. 0  I have looked forward with enjoyment to things. 0  I have blamed myself unnecessarily when things went wrong. 1  I have been anxious or worried for no good reason. 2  I have felt scared or panicky for no good reason. 0  Things have been getting on top of me. 0  I have been so unhappy that I have had difficulty sleeping. 0  I have felt sad or miserable. 0  I have been so unhappy that I have been crying. 1  The thought of harming myself has occurred to me. 0  Edinburgh Postnatal Depression Scale Total  4      After visit meds:  Allergies as of 11/20/2021       Reactions   Levaquin [levofloxacin In D5w] Swelling   Lip swelling, hives, chest tightness     Med Rec must be completed prior to using this [facility]***        Discharge home in stable condition Infant Feeding: {Baby feeding:23562} Infant Disposition:{CHL IP OB HOME WITH NRWCHJ:64383} Discharge instruction: per After Visit Summary and Postpartum booklet. Activity: Advance as tolerated. Pelvic rest for 6 weeks.  Diet: routine diet Anticipated Birth Control: {Birth Control:23956} Postpartum Appointment:{Outpatient follow up:23559} Additional Postpartum F/U: {PP Procedure:23957} Future Appointments: Future Appointments  Date Time Provider [place]  11/26/2021 10:35 AM Donnamae Jude, MD [facility]   Follow up Visit:      11/20/2021 Hansel Feinstein, CNM

## 2021-11-20 NOTE — Progress Notes (Signed)
Post Partum Day 1 Subjective: no complaints, up ad lib, voiding, and tolerating PO  Objective: Blood pressure 127/75, pulse 72, temperature 98.4 F (36.9 C), temperature source Axillary, resp. rate 18, last menstrual period 03/24/2021, SpO2 100 %, unknown if currently breastfeeding.  Physical Exam:  General: alert, cooperative, and no distress Lochia: appropriate Uterine Fundus: firm Incision: n/a DVT Evaluation: No evidence of DVT seen on physical exam.  Recent Labs    11/19/21 2230 11/20/21 0431  HGB 11.5* 10.6*  HCT 35.0* 31.3*    Assessment/Plan: Plan for discharge tomorrow, Social Work consult, Circumcision prior to discharge, and Contraception Undecided.  Wants BTL but only just signed papers.  Unsure what type of interval contraception she wants   LOS: 1 day   Hansel Feinstein, CNM 11/20/2021, 7:28 AM

## 2021-11-20 NOTE — Clinical Social Work Maternal (Signed)
CLINICAL SOCIAL WORK MATERNAL/CHILD NOTE  Patient Details  Name: Erica [name] MRN: [id number] Date of Birth: 09/24/1980  Date:  11/20/2021  Clinical Social Worker Initiating Note:  Letta Kocher, LCSWA Date/Time: Initiated:  11/20/21/1230     Child's Name:      Biological Parents:  Mother, Father Yanai Hobson 17[birth year]1981, Lois Huxley)   Need for Interpreter:  None   Reason for Referral:  Late or No Prenatal Care  , Current Substance Use/Substance Use During Pregnancy     Address:  [place] [address]    Phone number:  [phone] (home)     Additional phone number:   Household Members/Support Persons (HM/SP):   Household Member/Support Person 1   HM/SP Name Relationship DOB or Age  HM/SP -1 Dorina Hoyer support person 11/27/1957  HM/SP -2        HM/SP -3        HM/SP -4        HM/SP -5        HM/SP -6        HM/SP -7        HM/SP -8          Natural Supports (not living in the home):  Children, Parent   Professional Supports: None   Employment: Unemployed   Type of Work:     Education:  9 to 11 years   Homebound arranged: No  Financial Resources:  [employer]   Other Resources:  Physicist, medical  , [place] Considerations Which May Impact Care:    Strengths:  Engineer, materials, Home prepared for child     Psychotropic Medications:         Pediatrician:    [place] area  Pediatrician List:   [facility] ([address])  [place]      Pediatrician Fax Number:    Risk Factors/Current Problems:  Substance Use     Cognitive State:  Able to Concentrate     Mood/Affect:  Tearful  , Anxious     CSW Assessment: CSW received consult Drug Exposed Newborn. CSW met with MOB at bedside to complete assessment and offer support. When CSW entered the room, MOB was settling in bed with the  infant. MOB had support person Nicki Reaper in the room visiting. CSW introduced self, CSW role and requested to speak to MOB alone, MOB declined and stated " he can stay". CSW explained the reason for visit. MOB was calm and appropriate throughout the assessment.   CSW inquired about MOB's recent drug use, MOB reported prior to this recent "slip up" she had been clean for months. MOB reported she used cocaine last week. CSW notified MOB that infants UDS was positive for cocaine. MOB became tearful stated "they aren't going to take my baby are they". CSW explained to MOB that a CPS report would be made and CPS would determine the safest plan for the child. MOB voiced understanding. MOB reported she has a long history with substance use and was doing goof until this most recent slip. CSW offered MOB substance use resources, MOB accepted and was appreciative. CSW inquired why MOB had no PNC, MOB reported transportation issues. MOB stated "but that is no longer an issue". MOB reported support person Scott hs an extar car she drives and he also takes her where  she needs to go. CSW inquired about MH hx, MOB denied. CSE assessed for safety MOB denied any SI or HI.  MOB identified Nicki Reaper as her support. CSW provided education regarding the baby blues period vs. perinatal mood disorders, discussed treatment and gave resources for mental health follow up if concerns arise.  CSW recommends self-evaluation during the postpartum time period using the New Mom Checklist from Postpartum Progress and encouraged MOB to contact a medical professional if symptoms are noted at any time.    CSW provided review of Sudden Infant Death Syndrome (SIDS) precautions.  MOB reported she has a bassinet for he infant  to sleep but is in need of a Car seat. MOB reported she can provide $30. CSW provided MOB with a carseat for the infant.   CSW made CPS report to [place], [place] [phone] is the assigned CPS worker. SW Pamala Duffel,  notified CSW she was going to make contact with MOB to complete an assessment. Awaiting update form CPS.  Barriers to infants discharge   CSW Plan/Description:  Sudden Infant Death Syndrome (SIDS) Education, Perinatal Mood and Anxiety Disorder (PMADs) Education, [place], Child Protective Service Report  , CSW Will Continue to Monitor Umbilical Cord Tissue Drug Screen Results and Make Report if Warranted, CSW Awaiting CPS Disposition Plan, Neonatal Abstinence Syndrome (NAS) Education    Boris Sharper, LCSW 11/20/2021, 12:34 PM

## 2021-11-21 DIAGNOSIS — O34219 Maternal care for unspecified type scar from previous cesarean delivery: Secondary | ICD-10-CM | POA: Diagnosis not present

## 2021-11-21 LAB — CULTURE, BETA STREP (GROUP B ONLY)

## 2021-11-21 LAB — RUBELLA ANTIBODY, IGM: Rubella IgM: <20 AU/mL (ref 0.0–19.9)

## 2021-11-21 LAB — HEPATITIS B SURFACE ANTIGEN: Hepatitis B Surface Ag: NONREACTIVE

## 2021-11-21 MED ORDER — IBUPROFEN 600 MG PO TABS: 600.0000 mg | ORAL_TABLET | Freq: Four times a day (QID) | ORAL | 0 refills | Status: DC

## 2021-11-21 MED ORDER — MEASLES, MUMPS & RUBELLA VAC IJ SOLR
0.5000 mL | Freq: Once | INTRAMUSCULAR | Status: AC
  Administered 2021-11-21: 0.5 mL via SUBCUTANEOUS
  Filled 2021-11-21: qty 0.5

## 2021-11-21 NOTE — Progress Notes (Addendum)
CSW received an anonymous call from a healthcare worker who reports she personally knows MOB but was not involved in MOB's care during current hospitalization. Anonymous reporter expressed concern regarding infant discharging home with MOB, stating she does not believe infant will be safe or cared for if infant is discharged home with MOB. Anonymous reporter states MOB has a history of drug use and unstable housing. CSW reported concerns to [place] DSS After Hours On Call social worker, Casey Owens. Per Casey Owens, barriers to infant's discharge remain in place.   Signed,  Euclide Granito K. Vaudie Engebretsen, MSW, LCSWA, LCASA 11/21/2021 11:01 AM 

## 2021-11-23 LAB — HEPATITIS B E ANTIBODY: Hep B E Ab: NEGATIVE

## 2021-11-26 ENCOUNTER — Encounter: Payer: Medicaid Other | Admitting: Family Medicine

## 2021-11-26 NOTE — H&P (Signed)
Erica [name] is a 41 y.o. female presenting for Advanced Labor Did not have any prenatal care except one visit with Dr Dione Plover at 41 weeks  Patient Active Problem List   Diagnosis Date Noted   VBAC (vaginal birth after Cesarean) 11/21/2021   Uterine contractions during pregnancy 11/19/2021   Supervision of high risk pregnancy, antepartum 11/18/2021   History of cesarean delivery 11/18/2021   History of VBAC 11/18/2021   Unwanted fertility 11/18/2021   Grand multipara G10 [id number] 03/23/2019   History of substance abuse ([place]) 03/23/2019    . OB History     Gravida  12   Para  8   Term  5   Preterm  3   AB  3   Living  8      SAB  2   IAB  0   Ectopic  1   Multiple  0   Live Births  8          Past Medical History:  Diagnosis Date   Abnormal Pap smear    Anemia    Anxiety    as teen   Anxiety    Depression    as teen   GERD (gastroesophageal reflux disease)    Headache(784.0)    History of inadequate prenatal care (no care x8 weeks) 05/23/2019   Feb 26 - May 22, 2019   History of successful vaginal birth after cesarean, currently pregnant 05/19/2011   Desires repeat c/s and BTL.   No pertinent past medical history    Normal labor and delivery    Pneumonia    Postpartum depression    Pregnancy induced hypertension    Preterm labor    Preterm uterine contractions    Term pregnancy 08/18/2019   Tobacco use in pregnancy, childbirth, or the puerperium, antepartum 03/23/2019   03/23/19: smokes 5-8 cpd, pt's stated goal is to reduce to 5 cpd    Vaginal birth after cesarean section 08/20/2019   Vaginal Pap smear, abnormal    VBAC, delivered, current hospitalization 05/19/2011   Past Surgical History:  Procedure Laterality Date   APPENDECTOMY     CESAREAN SECTION  2008   COLPOSCOPY     DILATION AND CURETTAGE OF UTERUS  2010   LAPAROSCOPIC APPENDECTOMY N/A 07/11/2016   Procedure: APPENDECTOMY LAPAROSCOPIC;  Surgeon: Clayburn Pert, MD;  Location: [facility]  ORS;  Service: General;  Laterality: N/A;   WISDOM TOOTH EXTRACTION     Family History: family history includes Arthritis in her maternal grandmother; Birth defects in her daughter; Cancer in her maternal grandmother; Hypertension in her maternal grandmother and mother; Mental retardation in her cousin; Seizures in her brother, brother, cousin, cousin, maternal aunt, and mother; Stroke in her maternal grandfather. Social History:  reports that she has been smoking cigarettes. She has a 13.00 pack-year smoking history. She has never used smokeless tobacco. She reports that she does not currently use alcohol. She reports current drug use. Drug: Cocaine.     Maternal Diabetes: No Genetic Screening: Declined Maternal Ultrasounds/Referrals: Declined Fetal Ultrasounds or other Referrals:  None Maternal Substance Abuse:  Yes:  Type: Cocaine Significant Maternal Medications:  None Significant Maternal Lab Results:  None Number of Prenatal Visits:Less than or equal to 3 verified prenatal visits Other Comments:  None  Review of Systems  Constitutional:  Negative for chills and fever.  Eyes:  Negative for visual disturbance.  Respiratory:  Negative for shortness of breath.   Genitourinary:  Positive for pelvic pain and  vaginal discharge.   Maternal Medical History:  Reason for admission: Contractions.   Contractions: Frequency: regular.   Perceived severity is strong.   Fetal activity: Perceived fetal activity is normal.   Last perceived fetal movement was within the past hour.   Prenatal Complications - Diabetes: none.   Dilation: 10 Effacement (%): 70 Station: 0 Exam by:: Dow Adolph, RN Blood pressure 125/87, pulse 73, temperature 98.5 F (36.9 C), temperature source Oral, resp. rate 18, last menstrual period 03/24/2021, SpO2 100 %, unknown if currently breastfeeding. Maternal Exam:  Uterine Assessment: Contraction strength is firm.  Contraction frequency is regular.  Abdomen: Patient  reports no abdominal tenderness. Fetal presentation: vertex Introitus: Normal vulva. Normal vagina.  Ferning test: not done.  Nitrazine test: not done. Pelvis: adequate for delivery.   Cervix: Cervix evaluated by digital exam.     Fetal Exam Fetal Monitor Review: Mode: ultrasound.   Baseline rate: 150.  Variability: moderate (6-25 bpm).   Fetal State Assessment: Category I - tracings are normal.   Physical Exam Constitutional:      General: She is not in acute distress.    Appearance: She is not ill-appearing or toxic-appearing.  Cardiovascular:     Rate and Rhythm: Normal rate.  Pulmonary:     Effort: Pulmonary effort is normal.  Abdominal:     Tenderness: There is no abdominal tenderness.  Genitourinary:    General: Normal vulva.  Musculoskeletal:     Cervical back: Normal range of motion.  Skin:    General: Skin is warm and dry.  Neurological:     General: No focal deficit present.     Mental Status: She is alert.  Psychiatric:        Mood and Affect: Mood normal.     Prenatal labs: ABO, Rh: --/--/O POS (10/26 2230) Antibody: NEG (10/26 2230) Rubella: <20.0 (10/27 0431) RPR: NON REACTIVE (10/26 2230)  HBsAg: NON REACTIVE (10/28 0846)  HIV: NON REACTIVE (10/27 0431)  GBS:     Assessment/Plan: Single IUP at [redacted]w[redacted]d Active Labor Cocaine use  Admit to MAU for delivery   Wynelle Bourgeois 11/26/2021, 2:57 AM

## 2021-11-27 ENCOUNTER — Ambulatory Visit: Payer: Medicaid Other

## 2021-11-27 ENCOUNTER — Encounter: Payer: Self-pay | Admitting: *Deleted

## 2021-11-27 ENCOUNTER — Encounter: Payer: Self-pay | Admitting: Family Medicine

## 2021-11-27 DIAGNOSIS — F1911 Other psychoactive substance abuse, in remission: Secondary | ICD-10-CM

## 2021-11-30 ENCOUNTER — Encounter: Payer: Self-pay | Admitting: General Practice

## 2021-11-30 ENCOUNTER — Telehealth (HOSPITAL_COMMUNITY): Payer: Self-pay | Admitting: *Deleted

## 2021-11-30 NOTE — Telephone Encounter (Signed)
Mom reports feeling good. No concerns about herself at this time. EPDS=9 [facility] score=4) Reports she had an appt with Jamie this morning at [facility]. Mom reports baby is doing well. Feeding, peeing, and pooping without difficulty. Reports startle reflex happens often. Discussed cord site and bathing. Safe sleep reviewed. Mom reports no concerns about baby at present.  Odis Hollingshead, RN 11-30-2021 at 2:48pm

## 2021-12-01 NOTE — BH Specialist Note (Unsigned)
Pt did not arrive to video visit and did not answer the phone; Unable to leave voice message as mailbox is full; left MyChart message for patient.   

## 2021-12-03 ENCOUNTER — Ambulatory Visit: Payer: Medicaid Other | Admitting: Clinical

## 2021-12-03 DIAGNOSIS — Z91199 Patient's noncompliance with other medical treatment and regimen due to unspecified reason: Secondary | ICD-10-CM

## 2021-12-07 ENCOUNTER — Telehealth: Payer: Self-pay | Admitting: Clinical

## 2021-12-07 NOTE — Telephone Encounter (Signed)
Attempt to return pt call to reschedule missed appointment; unable to leave message as mailbox is full.

## 2021-12-21 NOTE — BH Specialist Note (Signed)
Integrated Behavioral Health via Telemedicine Visit  12/31/2021 Erica [name] [id number]  Number of Integrated Behavioral Health Clinician visits: 1- Initial Visit  Session Start time: 1454   Session End time: 1544  Total time in minutes: 50   Referring Provider: Merian Capron, MD Patient/Family location: Home [facility] Provider location: Center for [facility]  All persons participating in visit: Patient Erica [name] and [facility] Zailyn Rowser   Types of Service: Individual psychotherapy and Video visit  I connected with Erica [name] and/or Erica [name]'s  n/a  via  Telephone or Video Enabled Telemedicine Application  (Video is Caregility application) and verified that I am speaking with the correct person using two identifiers. Discussed confidentiality: Yes   I discussed the limitations of telemedicine and the availability of in person appointments.  Discussed there is a possibility of technology failure and discussed alternative modes of communication if that failure occurs.  I discussed that engaging in this telemedicine visit, they consent to the provision of behavioral healthcare and the services will be billed under their insurance.  Patient and/or legal guardian expressed understanding and consented to Telemedicine visit: Yes   Presenting Concerns: Patient and/or family reports the following symptoms/concerns: Pt distressed at baby in hospital with meningitis and UTI with e-coli infection. Pt states she was concerned with baby's health and felt she was not taken seriously by medical provider, took baby to hospital, where she was reported to CPS for "nodding off and fighting with FOB". Pt states she did not fight with FOB in the hospital, was arguing with someone on the phone loudly and feels she was accused of fighting with FOB due to a misunderstanding. Pt assumes her past substance use may have made others think she was using when nodding  off,  but was simply experiencing normal sleep deprivation while caring for a sick baby; states she and baby both tested negative for any substances and has stable housing. Pt experiencing extreme fatigue, poor sleep ,difficulty eating (nausea with anxiety) and crying spells after separation from baby.  Duration of problem: Less than one month; Severity of problem:  moderately severe  Patient and/or Family's Strengths/Protective Factors: Social connections, Concrete supports in place (healthy food, safe environments, etc.), and Sense of purpose  Goals Addressed: Patient will:  Reduce symptoms of: anxiety, depression, insomnia, and stress   Increase knowledge and/or ability of: healthy habits and stress reduction   Demonstrate ability to: Increase healthy adjustment to current life circumstances, Increase adequate support systems for patient/family, and Increase motivation to adhere to plan of care  Progress towards Goals: Ongoing  Interventions: Interventions utilized:  Solution-Focused Strategies, Psychoeducation and/or Health Education, and Link to [employer] Standardized Assessments completed: GAD-7 and PHQ 9  Patient and/or Family Response: Patient agrees with treatment plan.   Assessment: Patient currently experiencing Adjustment disorder with mixed anxious and depressed mood and Psychosocial stress.   Patient may benefit from psychoeducation and brief therapeutic interventions regarding coping with symptoms of depression, anxiety, life stress .  Plan: Follow up with behavioral health clinician on : Two weeks Behavioral recommendations:  -Begin prioritizing healthy self-care (regular meals, adequate rest; allowing practical help from supportive friends and family) -Consider resuming prenatal vitamin daily (as low hemoglobin may be increasing fatigue postpartum) -Consider new mom support group as needed at either [email or web address] or [email or web address] for extra  support -Follow all CPS instructions (Find new supervision person, continued screenings, etc.) as required -Continue plan to go to [facility]  for drug assessment (as required by CPS) as soon as possible Referral(s): Integrated Art gallery manager (In Clinic), Community Mental Health Services (LME/Outside Clinic), and Community Resources:  new parent support  I discussed the assessment and treatment plan with the patient and/or parent/guardian. They were provided an opportunity to ask questions and all were answered. They agreed with the plan and demonstrated an understanding of the instructions.   They were advised to call back or seek an in-person evaluation if the symptoms worsen or if the condition fails to improve as anticipated.  Valetta Close Joeline Freer, LCSW     12/31/2021    3:10 PM 11/18/2021    4:23 PM 03/19/2020    3:27 PM 11/01/2019    4:05 PM 07/23/2019    1:57 PM  Depression screen PHQ 2/9  Decreased Interest 1 0 0 0 0  Down, Depressed, Hopeless 3 1 1 1  0  PHQ - 2 Score 4 1 1 1  0  Altered sleeping 3 0 0    Tired, decreased energy 3 1 2     Change in appetite 3 1 0    Feeling bad or failure about yourself  1 1 0    Trouble concentrating 0 0 0    Moving slowly or fidgety/restless 0 0 0    Suicidal thoughts 0 0 0    PHQ-9 Score 14 4 3     Difficult doing work/chores  Not difficult at all         12/31/2021    3:17 PM 11/18/2021    4:24 PM 03/19/2020    3:27 PM  GAD 7 : Generalized Anxiety Score  Nervous, Anxious, on Edge 3 0 0  Control/stop worrying 3 1 1   Worry too much - different things 3 1 1   Trouble relaxing 3 0 0  Restless 0 0 0  Easily annoyed or irritable 3 0 1  Afraid - awful might happen 3 0 0  Total GAD 7 Score 18 2 3   Anxiety Difficulty  Not difficult at all

## 2021-12-27 ENCOUNTER — Inpatient Hospital Stay (HOSPITAL_COMMUNITY): Admit: 2021-12-27 | Payer: Self-pay

## 2021-12-29 NOTE — Progress Notes (Deleted)
    Post Partum Visit Note  Erica [name] is a 41 y.o. [id number] female who presents for a postpartum visit. She is 6 weeks postpartum following a VBAC.  I have fully reviewed the prenatal and intrapartum course. The delivery was at 36.1 gestational weeks.  Anesthesia: epidural. Postpartum course has been ***. Baby is doing well***. Baby is feeding by breast. Bleeding {vag bleed:12292}. Bowel function is {normal:32111}. Bladder function is {normal:32111}. Patient {is/is not:9024} sexually active. Contraception method is {contraceptive method:5051}. Postpartum depression screening: {gen negative/positive:315881}.   The pregnancy intention screening data noted above was reviewed. Potential methods of contraception were discussed. The patient elected to proceed with No data recorded.    There are no preventive care reminders to display for this patient.  {Common ambulatory SmartLinks:19316}  Review of Systems {ros; complete:30496}  Objective:  LMP 03/24/2021 (Approximate)    General:  {gen appearance:16600}   Breasts:  {desc; normal/abnormal/not indicated:14647}  Lungs: {lung exam:16931}  Heart:  {heart exam:5510}  Abdomen: {abdomen exam:16834}   Wound {Wound assessment:11097}  GU exam:  {desc; normal/abnormal/not indicated:14647}       Assessment:    There are no diagnoses linked to this encounter.  *** postpartum exam.   Plan:   Essential components of care per ACOG recommendations:  1.  Mood and well being: Patient with {gen negative/positive:315881} depression screening today. Reviewed local resources for support.  - Patient tobacco use? {tobacco use:25506}  - hx of drug use? {yes/no:25505}    2. Infant care and feeding:  -Patient currently breastmilk feeding? {yes/no:25502}  -Social determinants of health (SDOH) reviewed in EPIC. No concerns***The following needs were identified***  3. Sexuality, contraception and birth spacing - Patient {DOES_DOES FAO:13086} want a  pregnancy in the next year.  Desired family size is {NUMBER 1-10:22536} children.  - Reviewed reproductive life planning. Reviewed contraceptive methods based on pt preferences and effectiveness.  Patient desired {Upstream End Methods:24109} today.   - Discussed birth spacing of 18 months  4. Sleep and fatigue -Encouraged family/partner/community support of 4 hrs of uninterrupted sleep to help with mood and fatigue  5. Physical Recovery  - Discussed patients delivery and complications. She describes her labor as {description:25511} - Patient had a {CHL AMB [phone]}. Patient had a {laceration:25518} laceration. Perineal healing reviewed. Patient expressed understanding - Patient has urinary incontinence? {yes/no:25515} - Patient {ACTION; IS/IS VHQ:46962952} safe to resume physical and sexual activity  6.  Health Maintenance - HM due items addressed {Yes or If no, why not?:20788} - Last pap smear  Diagnosis  Date Value Ref Range Status  03/19/2020   Final   - Negative for intraepithelial lesion or malignancy (NILM)   Pap smear {done:10129} at today's visit.  -Breast Cancer screening indicated? {indicated:25516}  7. Chronic Disease/Pregnancy Condition follow up: {Follow up:25499}  - PCP follow up  Center for [employer], [facility]

## 2021-12-30 ENCOUNTER — Ambulatory Visit: Payer: Medicaid Other | Admitting: Family Medicine

## 2021-12-31 ENCOUNTER — Ambulatory Visit (INDEPENDENT_AMBULATORY_CARE_PROVIDER_SITE_OTHER): Payer: Medicaid Other | Admitting: Clinical

## 2021-12-31 DIAGNOSIS — F4323 Adjustment disorder with mixed anxiety and depressed mood: Secondary | ICD-10-CM

## 2021-12-31 DIAGNOSIS — Z658 Other specified problems related to psychosocial circumstances: Secondary | ICD-10-CM

## 2021-12-31 NOTE — Patient Instructions (Signed)
[facility] [address] [place], [place] [address] [phone] (main office) [phone] (Abagale Boulos's office)  New Parent Support Groups [email or web address]   

## 2022-01-04 NOTE — BH Specialist Note (Signed)
Pt did not arrive to video visit and did not answer the phone; Left HIPPA-compliant message to call back Reagen Goates from [facility] at  [phone] (Kaston Faughn's office).  ?; left MyChart message for patient.  ? ?

## 2022-01-14 ENCOUNTER — Ambulatory Visit: Payer: Medicaid Other | Admitting: Clinical

## 2022-01-14 DIAGNOSIS — Z91199 Patient's noncompliance with other medical treatment and regimen due to unspecified reason: Secondary | ICD-10-CM

## 2022-02-02 ENCOUNTER — Ambulatory Visit: Payer: Medicaid Other | Admitting: Family Medicine

## 2022-02-04 NOTE — Progress Notes (Deleted)
    [facility] Partum Visit Note  Erica [name] is a 42 y.o. [id number] female who presents for a postpartum visit. She is  12  weeks postpartum following a normal spontaneous vaginal delivery.  I have fully reviewed the prenatal and intrapartum course. The delivery was at 67 gestational weeks.  Anesthesia: none. Postpartum course has been ***. Baby is doing well***. Baby is feeding by {breastmilk/bottle:69}. Bleeding {vag bleed:12292}. Bowel function is {normal:32111}. Bladder function is {normal:32111}. Patient {is/is not:9024} sexually active. Contraception method is {contraceptive method:5051}. Postpartum depression screening: {gen negative/positive:315881}.   The pregnancy intention screening data noted above was reviewed. Potential methods of contraception were discussed. The patient elected to proceed with No data recorded.    There are no preventive care reminders to display for this patient.  {Common ambulatory SmartLinks:19316}  Review of Systems {ros; complete:30496}  Objective:  LMP 03/24/2021 (Approximate)    General:  {gen appearance:16600}   Breasts:  {desc; normal/abnormal/not indicated:14647}  Lungs: {lung exam:16931}  Heart:  {heart exam:5510}  Abdomen: {abdomen exam:16834}   Wound {Wound assessment:11097}  GU exam:  {desc; normal/abnormal/not indicated:14647}       Assessment:    There are no diagnoses linked to this encounter.  *** postpartum exam.   Plan:   Essential components of care per ACOG recommendations:  1.  Mood and well being: Patient with {gen negative/positive:315881} depression screening today. Reviewed local resources for support.  - Patient tobacco use? {tobacco use:25506}  - hx of drug use? {yes/no:25505}    2. Infant care and feeding:  -Patient currently breastmilk feeding? {yes/no:25502}  -Social determinants of health (SDOH) reviewed in EPIC. No concerns***The following needs were identified***  3. Sexuality, contraception and birth  spacing - Patient {DOES_DOES TKW:40973} want a pregnancy in the next year.  Desired family size is {NUMBER 1-10:22536} children.  - Reviewed reproductive life planning. Reviewed contraceptive methods based on pt preferences and effectiveness.  Patient desired {Upstream End Methods:24109} today.   - Discussed birth spacing of 18 months  4. Sleep and fatigue -Encouraged family/partner/community support of 4 hrs of uninterrupted sleep to help with mood and fatigue  5. Physical Recovery  - Discussed patients delivery and complications. She describes her labor as {description:25511} - Patient had a {CHL AMB [phone]}. Patient had a {laceration:25518} laceration. Perineal healing reviewed. Patient expressed understanding - Patient has urinary incontinence? {yes/no:25515} - Patient {ACTION; IS/IS ZHG:99242683} safe to resume physical and sexual activity  6.  Health Maintenance - HM due items addressed {Yes or If no, why not?:20788} - Last pap smear  Diagnosis  Date Value Ref Range Status  03/19/2020   Final   - Negative for intraepithelial lesion or malignancy (NILM)   Pap smear {done:10129} at today's visit.  -Breast Cancer screening indicated? {indicated:25516}  7. Chronic Disease/Pregnancy Condition follow up: {Follow up:25499}  - PCP follow up  Annabell Howells, [place] for [employer], [place]

## 2022-02-05 ENCOUNTER — Ambulatory Visit: Payer: Medicaid Other | Admitting: Family Medicine

## 2022-02-05 ENCOUNTER — Encounter: Payer: Self-pay | Admitting: Family Medicine

## 2022-02-05 NOTE — Progress Notes (Signed)
Patient did not keep appointment today. She will be called to reschedule.  

## 2022-02-19 ENCOUNTER — Telehealth: Payer: Self-pay | Admitting: Clinical

## 2022-02-19 NOTE — Telephone Encounter (Signed)
Attempt call regarding referral reschedule; Left HIPPA-compliant message to call back Roselyn Reef from [employer] for [employer] at [facility] for Women at  [phone] [facility] office).

## 2022-03-23 ENCOUNTER — Other Ambulatory Visit (HOSPITAL_COMMUNITY)
Admission: RE | Admit: 2022-03-23 | Discharge: 2022-03-23 | Disposition: A | Discharge status: Home / Self Care | Payer: Medicaid Other | Source: Ambulatory Visit | Attending: Obstetrics and Gynecology | Admitting: Obstetrics and Gynecology

## 2022-03-23 ENCOUNTER — Other Ambulatory Visit: Payer: Self-pay

## 2022-03-23 ENCOUNTER — Ambulatory Visit (INDEPENDENT_AMBULATORY_CARE_PROVIDER_SITE_OTHER): Payer: Medicaid Other | Admitting: Obstetrics and Gynecology

## 2022-03-23 ENCOUNTER — Encounter: Payer: Self-pay | Admitting: Obstetrics and Gynecology

## 2022-03-23 VITALS — BP 141/89 | HR 80

## 2022-03-23 DIAGNOSIS — N898 Other specified noninflammatory disorders of vagina: Secondary | ICD-10-CM | POA: Insufficient documentation

## 2022-03-23 DIAGNOSIS — N939 Abnormal uterine and vaginal bleeding, unspecified: Secondary | ICD-10-CM

## 2022-03-23 MED ORDER — DOXYCYCLINE HYCLATE 100 MG PO CAPS: 100.0000 mg | ORAL_CAPSULE | Freq: Two times a day (BID) | ORAL | 0 refills | Status: AC

## 2022-03-23 MED ORDER — NORETHINDRONE 0.35 MG PO TABS: 1.0000 | ORAL_TABLET | Freq: Every day | ORAL | 11 refills | Status: DC

## 2022-03-23 MED ORDER — MEGESTROL ACETATE 40 MG PO TABS: 40.0000 mg | ORAL_TABLET | Freq: Two times a day (BID) | ORAL | 0 refills | Status: DC

## 2022-03-23 NOTE — Progress Notes (Signed)
GYNECOLOGY VISIT  Patient name: Erica [name] MRN [id number]  Date of birth: 05/24/1980 Chief Complaint:   Vaginal Bleeding  History:  Erica [name] is a 42 y.o. [id number] being seen today for heavy menstrual bleeding.  Heavy periods since delivery in October. Typically menses are 5 days, this menses started last Wednesday. Heavy bleeding and an odor. Feeling lightheaded, dizzy, nauseous and feels weak and sleeping all of the time. Not currently taking medications. Worried she may have an STI. No pain with the bleeding - mostly nausea. All menses have been heavy since delivery. Pre-pregnancy, menses were 3-4 days with mild cramping day 1. Sexually active currently. Odor started prior to the most recent menses. Having more headaches previously. Using tampons and pads. Concerned she may have been exposed to an STI.  CHC contraindications: current tobacco use  No issues with taking daily medication  Prior birth control: depo provera   Past Medical History:  Diagnosis Date   Abnormal Pap smear    Anemia    Anxiety    as teen   Anxiety    Depression    as teen   GERD (gastroesophageal reflux disease)    Headache(784.0)    History of inadequate prenatal care (no care x8 weeks) 05/23/2019   Feb 26 - May 22, 2019   History of successful vaginal birth after cesarean, currently pregnant 05/19/2011   Desires repeat c/s and BTL.   No pertinent past medical history    Normal labor and delivery    Pneumonia    Postpartum depression    Pregnancy induced hypertension    Preterm labor    Preterm uterine contractions    Term pregnancy 08/18/2019   Tobacco use in pregnancy, childbirth, or the puerperium, antepartum 03/23/2019   03/23/19: smokes 5-8 cpd, pt's stated goal is to reduce to 5 cpd    Vaginal birth after cesarean section 08/20/2019   Vaginal Pap smear, abnormal    VBAC, delivered, current hospitalization 05/19/2011    Past Surgical History:  Procedure Laterality Date   APPENDECTOMY      CESAREAN SECTION  2008   COLPOSCOPY     DILATION AND CURETTAGE OF UTERUS  2010   LAPAROSCOPIC APPENDECTOMY N/A 07/11/2016   Procedure: APPENDECTOMY LAPAROSCOPIC;  Surgeon: Clayburn Pert, MD;  Location: [facility] ORS;  Service: General;  Laterality: N/A;   WISDOM TOOTH EXTRACTION      The following portions of the patient's history were reviewed and updated as appropriate: allergies, current medications, past family history, past medical history, past social history, past surgical history and problem list.   Health Maintenance:   Last pap 10/2021 LSIL, HR HPV pos    Component Value Date/Time   DIAGPAP  03/19/2020 1633    - Negative for intraepithelial lesion or malignancy (NILM)   ADEQPAP  03/19/2020 1633    Satisfactory for evaluation; transformation zone component PRESENT.    High Risk HPV: Positive  Adequacy:  Satisfactory for evaluation, transformation zone component PRESENT  Diagnosis:  Atypical squamous cells of undetermined significance (ASC-US)  Last mammogram: n/a   Review of Systems:  Pertinent items are noted in HPI. Comprehensive review of systems was otherwise negative.   Objective:  Physical Exam BP (!) 141/89   Pulse 80    Physical Exam Vitals and nursing note reviewed.  Constitutional:      Appearance: Normal appearance.  HENT:     Head: Normocephalic and atraumatic.  Pulmonary:     Effort: Pulmonary effort is  normal.  Abdominal:     Tenderness: There is abdominal tenderness in the right lower quadrant and left lower quadrant.  Skin:    General: Skin is warm and dry.  Neurological:     General: No focal deficit present.     Mental Status: She is alert.  Psychiatric:        Mood and Affect: Mood normal.        Behavior: Behavior normal.        Thought Content: Thought content normal.        Judgment: Judgment normal.         Assessment & Plan:   1. Vaginal odor Vaginitis swab today.  - Cervicovaginal ancillary only( [facility]) -  doxycycline (VIBRAMYCIN) 100 MG capsule; Take 1 capsule (100 mg total) by mouth 2 (two) times daily for 14 days.  Dispense: 28 capsule; Refill: 0  2. Abnormal uterine bleeding (AUB) Patient has abnormal uterine bleeding .  Will order abnormal uterine bleeding evaluation labs and pelvic ultrasound to evaluate for any structural gynecologic abnormalities.  Will contact patient with these results and plans for further evaluation/management. Can additionally do EMB at time of colposcopy to assess for endometrial pathology. Will assess stripe and rule out rPOC vs chronic endometritis via pelvic US and EMB. Start doxycycline for possible PID/chronic endometritis. Noted that there may be need for additional treatment pending results pending testing today. Megace also prescribed to stop current episode of bleeding and then norethindrone for contraception.   - CBC - Hemoglobin A1c - TSH Rfx on Abnormal to Free T4 - US PELVIC COMPLETE WITH TRANSVAGINAL; Future - norethindrone (MICRONOR) 0.35 MG tablet; Take 1 tablet (0.35 mg total) by mouth daily.  Dispense: 60 tablet; Refill: 11 - RPR+HBsAg+HCVAb+... - megestrol (MEGACE) 40 MG tablet; Take 1 tablet (40 mg total) by mouth 2 (two) times daily. Can increase to two tablets twice a day in the event of heavy bleeding  Dispense: 10 tablet; Refill: 0 - doxycycline (VIBRAMYCIN) 100 MG capsule; Take 1 capsule (100 mg total) by mouth 2 (two) times daily for 14 days.  Dispense: 28 capsule; Refill: 0   Routine preventative health maintenance measures emphasized.  Darliss Cheney, MD [facility]

## 2022-03-23 NOTE — Patient Instructions (Addendum)
Endometrial biopsy with the colposcopy at your follow up visit. This is to follow up your abnormal pap as well the heavy bleeding you have been having.   I have sent megace to help stop this current episode of bleeding. You will also start taking norethindrone to help with bleeding and prevent pregnancy. It is important you take the medication every day at the same time.   We did blood tests today to check your blood count, for sexually transmitted infections and various hormones/diseases that can cause abnormal bleeding.   Vaginal swabs typically take 24-48 hours to return and you will be contacted with the results and whether or not additional treatment needed.   You were started on an antibiotic for possible pelvic inflammatory disease. This medication is doxycycline - you take this twice a day for 2 weeks.

## 2022-03-24 LAB — RPR+HBSAG+HCVAB+...
HIV Screen 4th Generation wRfx: NONREACTIVE
Hep C Virus Ab: NONREACTIVE
Hepatitis B Surface Ag: NEGATIVE
RPR Ser Ql: NONREACTIVE

## 2022-03-24 LAB — HEMOGLOBIN A1C
Est. average glucose Bld gHb Est-mCnc: 103 mg/dL
Hgb A1c MFr Bld: 5.2 % (ref 4.8–5.6)

## 2022-03-24 LAB — CBC
Hematocrit: 40.3 % (ref 34.0–46.6)
Hemoglobin: 13.3 g/dL (ref 11.1–15.9)
MCH: 31.6 pg (ref 26.6–33.0)
MCHC: 33 g/dL (ref 31.5–35.7)
MCV: 96 fL (ref 79–97)
Platelets: 396 10*3/uL (ref 150–450)
RBC: 4.21 x10E6/uL (ref 3.77–5.28)
RDW: 13.3 % (ref 11.7–15.4)
WBC: 7.8 10*3/uL (ref 3.4–10.8)

## 2022-03-24 LAB — TSH RFX ON ABNORMAL TO FREE T4: TSH: 1.23 u[IU]/mL (ref 0.450–4.500)

## 2022-03-25 ENCOUNTER — Other Ambulatory Visit: Payer: Self-pay | Admitting: Obstetrics and Gynecology

## 2022-03-25 DIAGNOSIS — N760 Acute vaginitis: Secondary | ICD-10-CM

## 2022-03-25 LAB — CERVICOVAGINAL ANCILLARY ONLY
Bacterial Vaginitis (gardnerella): POSITIVE — AB
Candida Glabrata: NEGATIVE
Candida Vaginitis: NEGATIVE
Chlamydia: NEGATIVE
Comment: NEGATIVE
Comment: NEGATIVE
Comment: NEGATIVE
Comment: NEGATIVE
Comment: NEGATIVE
Comment: NORMAL
Neisseria Gonorrhea: NEGATIVE
Trichomonas: NEGATIVE

## 2022-03-25 MED ORDER — METRONIDAZOLE 500 MG PO TABS: 500.0000 mg | ORAL_TABLET | Freq: Two times a day (BID) | ORAL | 0 refills | Status: AC

## 2022-04-01 ENCOUNTER — Ambulatory Visit: Admission: RE | Admit: 2022-04-01 | Payer: Medicaid Other | Source: Ambulatory Visit

## 2022-04-10 ENCOUNTER — Ambulatory Visit: Admit: 2022-04-10 | Discharge status: Absent without leave | Payer: Medicaid Other

## 2022-04-11 ENCOUNTER — Ambulatory Visit (HOSPITAL_COMMUNITY): Payer: Self-pay

## 2022-05-03 ENCOUNTER — Ambulatory Visit: Payer: Medicaid Other | Admitting: Obstetrics and Gynecology

## 2022-05-17 ENCOUNTER — Ambulatory Visit
Admission: EM | Admit: 2022-05-17 | Discharge: 2022-05-17 | Disposition: A | Discharge status: Home / Self Care | Payer: Medicaid Other | Attending: Urgent Care | Admitting: Urgent Care

## 2022-05-17 DIAGNOSIS — B3731 Acute candidiasis of vulva and vagina: Secondary | ICD-10-CM | POA: Insufficient documentation

## 2022-05-17 DIAGNOSIS — J309 Allergic rhinitis, unspecified: Secondary | ICD-10-CM | POA: Diagnosis not present

## 2022-05-17 DIAGNOSIS — R300 Dysuria: Secondary | ICD-10-CM | POA: Insufficient documentation

## 2022-05-17 DIAGNOSIS — N3001 Acute cystitis with hematuria: Secondary | ICD-10-CM | POA: Diagnosis not present

## 2022-05-17 DIAGNOSIS — J018 Other acute sinusitis: Secondary | ICD-10-CM | POA: Insufficient documentation

## 2022-05-17 DIAGNOSIS — R070 Pain in throat: Secondary | ICD-10-CM | POA: Diagnosis present

## 2022-05-17 LAB — POCT URINALYSIS DIP (MANUAL ENTRY)
Bilirubin, UA: NEGATIVE
Glucose, UA: NEGATIVE mg/dL
Ketones, POC UA: NEGATIVE mg/dL
Nitrite, UA: POSITIVE — AB
Spec Grav, UA: >=1.03 — AB (ref 1.010–1.025)
Urobilinogen, UA: 0.2 E.U./dL
pH, UA: 6 (ref 5.0–8.0)

## 2022-05-17 LAB — POCT URINE PREGNANCY: Preg Test, Ur: NEGATIVE

## 2022-05-17 MED ORDER — PSEUDOEPHEDRINE HCL 30 MG PO TABS: 30.0000 mg | ORAL_TABLET | Freq: Three times a day (TID) | ORAL | 0 refills | Status: DC | PRN

## 2022-05-17 MED ORDER — FLUCONAZOLE 150 MG PO TABS: 150.0000 mg | ORAL_TABLET | ORAL | 0 refills | Status: DC

## 2022-05-17 MED ORDER — CEFDINIR 300 MG PO CAPS: 300.0000 mg | ORAL_CAPSULE | Freq: Two times a day (BID) | ORAL | 0 refills | Status: DC

## 2022-05-17 MED ORDER — CETIRIZINE HCL 10 MG PO TABS: 10.0000 mg | ORAL_TABLET | Freq: Every day | ORAL | 0 refills | Status: DC

## 2022-05-17 NOTE — ED Triage Notes (Signed)
Pt reports vaginal Itching x 1 day; low back pain x 1 week; burning when urinating x 3 weeks.    Pt reports nasal congestion x 3 weeks.   Pt reports insect bite in the right buttocks x 4 days.

## 2022-05-17 NOTE — ED Provider Notes (Signed)
[facility]  Note:  This document was prepared using Conservation officer, historic buildings and may include unintentional dictation errors.  MRN: [id number] DOB: [birth year]  Subjective:   Erica [name] is a 42 y.o. female presenting for multiple complaints.  Reports 3 week history of cloudy urine, dysuria, vaginal itching/irritation, urinary frequency/urgency, low back pain. Has a history of UTIs. Does not hydrate well. Drinks sodas, tea daily.  Has had some slight vaginal discharge. Feels like she has a yeast infection, had sex with a partner that tested positive for a yeast infection. Also has a personal history of the same.  Reports 2 week history of persistent sinus congestion, sinus pressure, sees white patches on her throat. Has had a cough. No chest pain, shob, wheezing. Smokes a pack per day. No vaping, marijuana use. Also does cocaine intermittently, a few times a week.  Has had some oral sex in the past, wants to cover for STIs. This is relevant due to her throat pain.  Has concerns about her blood pressure.  Reports that ever since she gave birth 4 months ago, has had elevated readings.  Does not have a PCP.  No history of stroke, heart disease.  No current facility-administered medications for this encounter.  Current Outpatient Medications:    ibuprofen (ADVIL) 600 MG tablet, Take 1 tablet (600 mg total) by mouth every 6 (six) hours. (Patient not taking: Reported on 03/23/2022), Disp: 30 tablet, Rfl: 0   megestrol (MEGACE) 40 MG tablet, Take 1 tablet (40 mg total) by mouth 2 (two) times daily. Can increase to two tablets twice a day in the event of heavy bleeding, Disp: 10 tablet, Rfl: 0   norethindrone (MICRONOR) 0.35 MG tablet, Take 1 tablet (0.35 mg total) by mouth daily., Disp: 60 tablet, Rfl: 11   Prenatal Vit-Fe Fumarate-FA (M-NATAL PLUS PO), Take 1 tablet by mouth daily. (Patient not taking: Reported on 03/23/2022), Disp: , Rfl:    Allergies  Allergen  Reactions   Levaquin [Levofloxacin] Hives, Swelling and Other (See Comments)    Lip swelling Chest tightness    Past Medical History:  Diagnosis Date   Abnormal Pap smear    Anemia    Anxiety    as teen   Anxiety    Depression    as teen   GERD (gastroesophageal reflux disease)    Headache(784.0)    History of inadequate prenatal care (no care x8 weeks) 05/23/2019   Feb 26 - May 22, 2019   History of successful vaginal birth after cesarean, currently pregnant 05/19/2011   Desires repeat c/s and BTL.   No pertinent past medical history    Normal labor and delivery    Pneumonia    Postpartum depression    Pregnancy induced hypertension    Preterm labor    Preterm uterine contractions    Term pregnancy 08/18/2019   Tobacco use in pregnancy, childbirth, or the puerperium, antepartum 03/23/2019   03/23/19: smokes 5-8 cpd, pt's stated goal is to reduce to 5 cpd    Vaginal birth after cesarean section 08/20/2019   Vaginal Pap smear, abnormal    VBAC, delivered, current hospitalization 05/19/2011     Past Surgical History:  Procedure Laterality Date   APPENDECTOMY     CESAREAN SECTION  2008   COLPOSCOPY     DILATION AND CURETTAGE OF UTERUS  2010   LAPAROSCOPIC APPENDECTOMY N/A 07/11/2016   Procedure: APPENDECTOMY LAPAROSCOPIC;  Surgeon: Ricarda Frame, MD;  Location: [facility] ORS;  Service: General;  Laterality: N/A;   WISDOM TOOTH EXTRACTION      Family History  Problem Relation Age of Onset   Hypertension Mother    Seizures Mother    Hypertension Maternal Grandmother    Arthritis Maternal Grandmother    Cancer Maternal Grandmother        breast   Stroke Maternal Grandfather    Seizures Brother    Birth defects Daughter        hole in heart   Seizures Maternal Aunt    Seizures Brother    Seizures Cousin    Seizures Cousin    Mental retardation Cousin     Social History   Tobacco Use   Smoking status: Every Day    Packs/day: 1.00    Years: 13.00    Additional  pack years: 0.00    Total pack years: 13.00    Types: Cigarettes   Smokeless tobacco: Never  Vaping Use   Vaping Use: Never used  Substance Use Topics   Alcohol use: Not Currently    Comment: 12/06/2018   Drug use: Yes    Types: Cocaine    Comment: last use 11/18/2021    ROS   Objective:   Vitals: BP (!) 154/91 (BP Location: Right Arm)   Pulse 84   Temp 98.9 F (37.2 C) (Oral)   Resp 18   LMP  (Within Weeks) Comment: 3 weeks.  SpO2 97%   Breastfeeding No   BP Readings from Last 3 Encounters:  05/17/22 (!) 154/91  03/23/22 (!) 141/89  11/21/21 125/87   Physical Exam Constitutional:      General: She is not in acute distress.    Appearance: Normal appearance. She is well-developed and normal weight. She is not ill-appearing, toxic-appearing or diaphoretic.  HENT:     Head: Normocephalic and atraumatic.     Right Ear: Tympanic membrane, ear canal and external ear normal. No drainage or tenderness. No middle ear effusion. There is no impacted cerumen. Tympanic membrane is not injected, erythematous or bulging.     Left Ear: Ear canal and external ear normal. No drainage or tenderness.  No middle ear effusion. There is no impacted cerumen. Tympanic membrane is injected. Tympanic membrane is not erythematous or bulging.     Nose: Congestion present. No rhinorrhea.     Mouth/Throat:     Mouth: Mucous membranes are moist. No oral lesions.     Pharynx: No pharyngeal swelling, oropharyngeal exudate, posterior oropharyngeal erythema or uvula swelling.     Tonsils: No tonsillar exudate or tonsillar abscesses.  Eyes:     General: No scleral icterus.       Right eye: No discharge.        Left eye: No discharge.     Extraocular Movements: Extraocular movements intact.     Right eye: Normal extraocular motion.     Left eye: Normal extraocular motion.     Conjunctiva/sclera: Conjunctivae normal.  Cardiovascular:     Rate and Rhythm: Normal rate.  Pulmonary:     Effort:  Pulmonary effort is normal.  Abdominal:     General: Bowel sounds are normal. There is no distension.     Palpations: Abdomen is soft. There is no mass.     Tenderness: There is no abdominal tenderness. There is no right CVA tenderness, left CVA tenderness, guarding or rebound.  Musculoskeletal:     Cervical back: Normal range of motion and neck supple.  Lymphadenopathy:     Cervical:  No cervical adenopathy.  Skin:    General: Skin is warm and dry.  Neurological:     General: No focal deficit present.     Mental Status: She is alert and oriented to person, place, and time.  Psychiatric:        Mood and Affect: Mood normal.        Behavior: Behavior normal.        Thought Content: Thought content normal.        Judgment: Judgment normal.     Results for orders placed or performed during the hospital encounter of 05/17/22 (from the past 24 hour(s))  POCT urine pregnancy     Status: None   Collection Time: 05/17/22  7:13 PM  Result Value Ref Range   Preg Test, Ur Negative Negative  POCT urinalysis dipstick     Status: Abnormal   Collection Time: 05/17/22  7:13 PM  Result Value Ref Range   Color, UA yellow yellow   Clarity, UA clear clear   Glucose, UA negative negative mg/dL   Bilirubin, UA negative negative   Ketones, POC UA negative negative mg/dL   Spec Grav, UA >=1.610 (A) 1.010 - 1.025   Blood, UA moderate (A) negative   pH, UA 6.0 5.0 - 8.0   Protein Ur, POC trace (A) negative mg/dL   Urobilinogen, UA 0.2 0.2 or 1.0 E.U./dL   Nitrite, UA Positive (A) Negative   Leukocytes, UA Small (1+) (A) Negative    Assessment and Plan :   PDMP not reviewed this encounter.  1. Acute cystitis with hematuria   2. Dysuria   3. Yeast vaginitis   4. Allergic rhinitis, unspecified seasonality, unspecified trigger   5. Other acute sinusitis, recurrence not specified   6. Throat pain    Patient has a history of C. difficile and therefore advised that we be judicious in using  antibiotics.  Will use cefdinir to cover for sinusitis, otitis media and as a cephalosporin may have coverage for cystitis.  She is to hydrate much better and consistently, avoid urinary irritants.  Urine culture, oral cytology and vaginal cytology pending.  Recommended empiric treatment for yeast vaginitis with fluconazole especially as we are using an antibiotic course.  Use Zyrtec and pseudoephedrine otherwise.  Patient does not have hypertensive urgency and therefore recommended follow-up with a new PCP.  Provided her with information to [facility] family medicine.  Counseled patient on potential for adverse effects with medications prescribed/recommended today, ER and return-to-clinic precautions discussed, patient verbalized understanding.    Wallis Bamberg, [place] 05/17/22 1948

## 2022-05-18 LAB — CYTOLOGY, (ORAL, ANAL, URETHRAL) ANCILLARY ONLY
Chlamydia: NEGATIVE
Comment: NEGATIVE
Comment: NEGATIVE
Comment: NORMAL
Neisseria Gonorrhea: NEGATIVE
Trichomonas: NEGATIVE

## 2022-05-18 LAB — CERVICOVAGINAL ANCILLARY ONLY
Bacterial Vaginitis (gardnerella): NEGATIVE
Candida Glabrata: POSITIVE — AB
Candida Vaginitis: POSITIVE — AB
Chlamydia: NEGATIVE
Comment: NEGATIVE
Comment: NEGATIVE
Comment: NEGATIVE
Comment: NEGATIVE
Comment: NEGATIVE
Comment: NORMAL
Neisseria Gonorrhea: NEGATIVE
Trichomonas: NEGATIVE

## 2022-05-19 LAB — URINE CULTURE: Culture: >=100000 — AB

## 2022-06-03 ENCOUNTER — Ambulatory Visit
Admission: EM | Admit: 2022-06-03 | Discharge: 2022-06-03 | Disposition: A | Discharge status: Home / Self Care | Payer: Medicaid Other | Attending: Internal Medicine | Admitting: Internal Medicine

## 2022-06-03 DIAGNOSIS — J340 Abscess, furuncle and carbuncle of nose: Secondary | ICD-10-CM | POA: Diagnosis not present

## 2022-06-03 MED ORDER — SULFAMETHOXAZOLE-TRIMETHOPRIM 800-160 MG PO TABS: 1.0000 | ORAL_TABLET | Freq: Two times a day (BID) | ORAL | 0 refills | Status: AC

## 2022-06-03 NOTE — ED Provider Notes (Signed)
Erica [name]    [name]: [id number] Arrival date & time: 06/03/22  1341      History   Chief Complaint Chief Complaint  Patient presents with   Facial Swelling    Nose area     HPI Erica [name] is a 42 y.o. female.   Patient presents with nose pain, swelling, redness that started about 3 days ago.  Reports that she thinks that she may have scratched her nose prior to it starting but is not sure.  She denies any additional injury to the nose.  Reports that she thinks there was some purulent drainage coming from the nose.  Denies that she does any illicit drugs that she would snort.  Denies any associated fever but reports that she did feel feverish.     Past Medical History:  Diagnosis Date   Abnormal Pap smear    Anemia    Anxiety    as teen   Anxiety    Depression    as teen   GERD (gastroesophageal reflux disease)    Headache(784.0)    History of inadequate prenatal [name] (no [name] x8 weeks) 05/23/2019   Feb 26 - May 22, 2019   History of successful vaginal birth after cesarean, currently pregnant 05/19/2011   Desires repeat c/s and BTL.   No pertinent past medical history    Normal labor and delivery    Pneumonia    Postpartum depression    Pregnancy induced hypertension    Preterm labor    Preterm uterine contractions    Term pregnancy 08/18/2019   Tobacco use in pregnancy, childbirth, or the puerperium, antepartum 03/23/2019   03/23/19: smokes 5-8 cpd, pt's stated goal is to reduce to 5 cpd    Vaginal birth after cesarean section 08/20/2019   Vaginal Pap smear, abnormal    VBAC, delivered, current hospitalization 05/19/2011    Patient Active Problem List   Diagnosis Date Noted   History of cesarean delivery 11/18/2021   History of VBAC 11/18/2021   Unwanted fertility 11/18/2021   History of substance abuse (HCC) 03/23/2019    Past Surgical History:  Procedure Laterality Date   APPENDECTOMY     CESAREAN SECTION  2008   COLPOSCOPY     DILATION  AND CURETTAGE OF UTERUS  2010   LAPAROSCOPIC APPENDECTOMY N/A 07/11/2016   Procedure: APPENDECTOMY LAPAROSCOPIC;  Surgeon: Ricarda Frame, MD;  Location: [facility] ORS;  Service: General;  Laterality: N/A;   WISDOM TOOTH EXTRACTION      OB History     Gravida  12   Para  8   Term  5   Preterm  3   AB  3   Living  8      SAB  2   IAB  0   Ectopic  1   Multiple  0   Live Births  8            Home Medications    Prior to Admission medications   Medication Sig Start Date End Date Taking? Authorizing Provider  sulfamethoxazole-trimethoprim (BACTRIM DS) 800-160 MG tablet Take 1 tablet by mouth 2 (two) times daily for 7 days. 06/03/22 06/10/22 Yes , Acie Fredrickson, FNP  cefdinir (OMNICEF) 300 MG capsule Take 1 capsule (300 mg total) by mouth 2 (two) times daily. 05/17/22   Wallis Bamberg, PA-C  cetirizine (ZYRTEC ALLERGY) 10 MG tablet Take 1 tablet (10 mg total) by mouth daily. 05/17/22   Wallis Bamberg, PA-C  fluconazole (DIFLUCAN)  150 MG tablet Take 1 tablet (150 mg total) by mouth every 3 (three) days. 05/17/22   Wallis Bamberg, PA-C  ibuprofen (ADVIL) 600 MG tablet Take 1 tablet (600 mg total) by mouth every 6 (six) hours. Patient not taking: Reported on 03/23/2022 11/21/21   Aviva Signs, CNM  megestrol (MEGACE) 40 MG tablet Take 1 tablet (40 mg total) by mouth 2 (two) times daily. Can increase to two tablets twice a day in the event of heavy bleeding 03/23/22   Lorriane Shire, MD  norethindrone (MICRONOR) 0.35 MG tablet Take 1 tablet (0.35 mg total) by mouth daily. 03/23/22   Lorriane Shire, MD  Prenatal Vit-Fe Fumarate-FA (M-NATAL PLUS PO) Take 1 tablet by mouth daily. Patient not taking: Reported on 03/23/2022    [provider]  pseudoephedrine (SUDAFED) 30 MG tablet Take 1 tablet (30 mg total) by mouth every 8 (eight) hours as needed for congestion. 05/17/22   Wallis Bamberg, PA-C    Family History Family History  Problem Relation Age of Onset   Hypertension  Mother    Seizures Mother    Hypertension Maternal Grandmother    Arthritis Maternal Grandmother    Cancer Maternal Grandmother        breast   Stroke Maternal Grandfather    Seizures Brother    Birth defects Daughter        hole in heart   Seizures Maternal Aunt    Seizures Brother    Seizures Cousin    Seizures Cousin    Mental retardation Cousin     Social History Social History   Tobacco Use   Smoking status: Every Day    Packs/day: 1.00    Years: 13.00    Additional pack years: 0.00    Total pack years: 13.00    Types: Cigarettes   Smokeless tobacco: Never  Vaping Use   Vaping Use: Never used  Substance Use Topics   Alcohol use: Not Currently    Comment: 12/06/2018   Drug use: Yes    Types: Cocaine    Comment: last use 11/18/2021     Allergies   Levaquin [levofloxacin]   Review of Systems Review of Systems Per HPI  Physical Exam Triage Vital Signs ED Triage Vitals  Enc Vitals Group     BP 06/03/22 1447 128/81     Pulse Rate 06/03/22 1447 84     Resp 06/03/22 1447 20     Temp 06/03/22 1447 98.5 F (36.9 C)     Temp Source 06/03/22 1447 Oral     SpO2 06/03/22 1447 97 %     Weight --      Height --      Head Circumference --      Peak Flow --      Pain Score 06/03/22 1451 10     Pain Loc --      Pain Edu? --      Excl. in GC? --    No data found.  Updated Vital Signs BP 128/81 (BP Location: Right Arm)   Pulse 84   Temp 98.5 F (36.9 C) (Oral)   Resp 20   LMP 05/25/2022 (Within Days)   SpO2 97%   Breastfeeding No   Visual Acuity Right Eye Distance:   Left Eye Distance:   Bilateral Distance:    Right Eye Near:   Left Eye Near:    Bilateral Near:     Physical Exam Constitutional:      General: She is  not in acute distress.    Appearance: Normal appearance. She is not toxic-appearing or diaphoretic.  HENT:     Head: Normocephalic and atraumatic.     Comments: Patient has swelling and erythema noted to the tip of the nose that  extends slightly into the left nostril.  No purulent drainage noted.  No area of fluctuance.  Does not extend into the face. Eyes:     Extraocular Movements: Extraocular movements intact.     Conjunctiva/sclera: Conjunctivae normal.  Pulmonary:     Effort: Pulmonary effort is normal.  Neurological:     General: No focal deficit present.     Mental Status: She is alert and oriented to person, place, and time. Mental status is at baseline.  Psychiatric:        Mood and Affect: Mood normal.        Behavior: Behavior normal.        Thought Content: Thought content normal.        Judgment: Judgment normal.      UC Treatments / Results  Labs (all labs ordered are listed, but only abnormal results are displayed) Labs Reviewed - No data to display  EKG   Radiology No results found.  Procedures Procedures (including critical [name] time)  Medications Ordered in UC Medications - No data to display  Initial Impression / Assessment and Plan / UC Course  I have reviewed the triage vital signs and the nursing notes.  Pertinent labs & imaging results that were available during my [name] of the patient were reviewed by me and considered in my medical decision making (see chart for details).     Patient has infection/cellulitis of the nose.  No signs of airway compromise or fever so do not think that emergent evaluation is necessary.  Will treat with antibiotic today but the patient was encouraged to go to the ER if symptoms persist or worsen in the next 48 to 72 hours.  Patient was seen at urgent [name] on 4/22 and treated with cefdinir for multiple chief complaints including UTI.  Patient reports that she has not yet completed this as she has not been taking as prescribed and has approximately 2 days left.  Advised patient that she may continue the antibiotic along with newly prescribed Bactrim but Bactrim will also treat UTI bacteria noted on urine culture if she wishes to discontinue cefdinir  given other symptoms have resolved.  Discussed return precautions.  Patient verbalized understanding and was agreeable with plan. Final Clinical Impressions(s) / UC Diagnoses   Final diagnoses:  Cellulitis of nose     Discharge Instructions      I have prescribed an antibiotic for your nasal infection.  Recommend that you go straight to the emergency department if symptoms persist or worsen in the next 48 to 72 hours.    ED Prescriptions     Medication Sig Dispense Auth. Provider   sulfamethoxazole-trimethoprim (BACTRIM DS) 800-160 MG tablet Take 1 tablet by mouth 2 (two) times daily for 7 days. 14 tablet [place], Acie Fredrickson, [place]      PDMP not reviewed this encounter.   Gustavus Bryant, [place] 06/03/22 1515

## 2022-06-03 NOTE — Discharge Instructions (Signed)
I have prescribed an antibiotic for your nasal infection.  Recommend that you go straight to the emergency department if symptoms persist or worsen in the next 48 to 72 hours.

## 2022-06-03 NOTE — ED Triage Notes (Signed)
Pt c/o nose pain and swelling, pt states she noticed the sore inside her nose a x 3 days ago the pain and swelling start the following day. There is a cut inside the left nostril that is scabbed over and there is redness, swelling and pain.

## 2022-07-08 ENCOUNTER — Ambulatory Visit: Admit: 2022-07-08 | Discharge status: Against Medical Advice | Payer: 59

## 2022-07-11 ENCOUNTER — Ambulatory Visit: Payer: Self-pay

## 2022-07-25 ENCOUNTER — Ambulatory Visit: Admission: EM | Admit: 2022-07-25 | Discharge: 2022-07-25 | Discharge status: Against Medical Advice | Payer: 59

## 2022-07-25 NOTE — ED Triage Notes (Signed)
Pt left without being seen.

## 2022-08-04 ENCOUNTER — Ambulatory Visit: Admit: 2022-08-04 | Payer: 59

## 2022-09-03 ENCOUNTER — Ambulatory Visit
Admission: EM | Admit: 2022-09-03 | Discharge: 2022-09-03 | Disposition: A | Discharge status: Home / Self Care | Payer: 59 | Attending: Internal Medicine | Admitting: Internal Medicine

## 2022-09-03 DIAGNOSIS — Z3201 Encounter for pregnancy test, result positive: Secondary | ICD-10-CM | POA: Insufficient documentation

## 2022-09-03 DIAGNOSIS — K0889 Other specified disorders of teeth and supporting structures: Secondary | ICD-10-CM | POA: Insufficient documentation

## 2022-09-03 DIAGNOSIS — R350 Frequency of micturition: Secondary | ICD-10-CM | POA: Insufficient documentation

## 2022-09-03 DIAGNOSIS — H65192 Other acute nonsuppurative otitis media, left ear: Secondary | ICD-10-CM | POA: Insufficient documentation

## 2022-09-03 DIAGNOSIS — N898 Other specified noninflammatory disorders of vagina: Secondary | ICD-10-CM | POA: Insufficient documentation

## 2022-09-03 DIAGNOSIS — Z113 Encounter for screening for infections with a predominantly sexual mode of transmission: Secondary | ICD-10-CM | POA: Insufficient documentation

## 2022-09-03 DIAGNOSIS — R112 Nausea with vomiting, unspecified: Secondary | ICD-10-CM | POA: Insufficient documentation

## 2022-09-03 LAB — POCT URINALYSIS DIP (MANUAL ENTRY)
Bilirubin, UA: NEGATIVE
Glucose, UA: NEGATIVE mg/dL
Ketones, POC UA: NEGATIVE mg/dL
Leukocytes, UA: NEGATIVE
Nitrite, UA: NEGATIVE
Protein Ur, POC: NEGATIVE mg/dL
Spec Grav, UA: 1.02 (ref 1.010–1.025)
Urobilinogen, UA: 0.2 E.U./dL
pH, UA: 6 (ref 5.0–8.0)

## 2022-09-03 LAB — POCT URINE PREGNANCY: Preg Test, Ur: POSITIVE — AB

## 2022-09-03 MED ORDER — FLUTICASONE PROPIONATE 50 MCG/ACT NA SUSP: 1.0000 | Freq: Every day | NASAL | 0 refills | Status: DC

## 2022-09-03 MED ORDER — AMOXICILLIN 875 MG PO TABS: 875.0000 mg | ORAL_TABLET | Freq: Two times a day (BID) | ORAL | 0 refills | Status: AC

## 2022-09-03 NOTE — Discharge Instructions (Signed)
Your pregnancy test was positive.  Please avoid smoking and drug use.  Start taking prenatal vitamin and follow-up with OB/GYN as soon as possible.  If abdominal pain returns, please go straight to the emergency department for evaluation.  I have prescribed an antibiotic for ear infection and dental pain/infection.  Follow-up with dentist as soon as possible.  STD testing and urine culture pending.

## 2022-09-03 NOTE — ED Triage Notes (Signed)
Pt states she had a UTI and did not finish taking the antibiotics.  States she has N/V , cloudy urine and lightheaded.  Also having left ear pain and left dental pain.

## 2022-09-03 NOTE — ED Provider Notes (Signed)
[name]    [name]: [id number] Arrival date & time: 09/03/22  1858      History   Chief Complaint Chief Complaint  Patient presents with   Dysuria    HPI Erica [name] is a 42 y.o. female.   Patient presents with several different chief complaints today.  Patient is not exactly sure when any of her symptoms started and is a poor historian.  Patient states that she is concerned that she has a UTI given she has been having dysuria, urinary frequency, malodorous urine.  Reports that she was seen in April and prescribed antibiotic but did not take it completely or correctly.  She states symptoms seemed to improve and then returned a few weeks ago.  She is also reporting some white vaginal discharge that started a few weeks ago as well. Denies confirmed exposure to STD but patient would like STD testing as she has had unprotected intercourse.  Patient does not want testing for HIV or syphilis.  She reports a very mild lower abdominal pain and lower back pain that occurs intermittently.  Denies any current pain and is not sure the last time that she felt this.  Patient thinks her last menstrual cycle was in July but she is not sure.  States that she was previously on Depo for irregular menstrual cycles but no longer takes any form of birth control at this time.  Denies any hematuria or any abnormal vaginal bleeding recently.  Patient also reporting some left ear pain but is not sure when it started.  She has also had a little bit of nasal congestion as well.  Denies any coughing associated with this.  Patient also reporting some left upper dental pain but is not sure when this started either.  Denies any trauma to the face.  Patient is taking ibuprofen and Tylenol for her pain.  She also reports some nausea and vomiting that is intermittent as well of some intermittent dizziness.  As discussed previously, patient is not sure exactly when any of the symptoms started.  States that she recently  had a baby about 9 months ago.  She states that she does currently smoke cigarettes and occasionally uses cocaine.  She denies that she takes any daily medications.   Dysuria   Past Medical History:  Diagnosis Date   Abnormal Pap smear    Anemia    Anxiety    as teen   Anxiety    Depression    as teen   GERD (gastroesophageal reflux disease)    Headache(784.0)    History of inadequate prenatal care (no care x8 weeks) 05/23/2019   Feb 26 - May 22, 2019   History of successful vaginal birth after cesarean, currently pregnant 05/19/2011   Desires repeat c/s and BTL.   No pertinent past medical history    Normal labor and delivery    Pneumonia    Postpartum depression    Pregnancy induced hypertension    Preterm labor    Preterm uterine contractions    Term pregnancy 08/18/2019   Tobacco use in pregnancy, childbirth, or the puerperium, antepartum 03/23/2019   03/23/19: smokes 5-8 cpd, pt's stated goal is to reduce to 5 cpd    Vaginal birth after cesarean section 08/20/2019   Vaginal Pap smear, abnormal    VBAC, delivered, current hospitalization 05/19/2011    Patient Active Problem List   Diagnosis Date Noted   History of cesarean delivery 11/18/2021   History of VBAC  11/18/2021   Unwanted fertility 11/18/2021   History of substance abuse (HCC) 03/23/2019    Past Surgical History:  Procedure Laterality Date   APPENDECTOMY     CESAREAN SECTION  2008   COLPOSCOPY     DILATION AND CURETTAGE OF UTERUS  2010   LAPAROSCOPIC APPENDECTOMY N/A 07/11/2016   Procedure: APPENDECTOMY LAPAROSCOPIC;  Surgeon: Ricarda Frame, MD;  Location: [facility] ORS;  Service: General;  Laterality: N/A;   WISDOM TOOTH EXTRACTION      OB History     Gravida  12   Para  8   Term  5   Preterm  3   AB  3   Living  8      SAB  2   IAB  0   Ectopic  1   Multiple  0   Live Births  8            Home Medications    Prior to Admission medications   Medication Sig Start Date  End Date Taking? Authorizing Provider  amoxicillin (AMOXIL) 875 MG tablet Take 1 tablet (875 mg total) by mouth 2 (two) times daily for 7 days. 09/03/22 09/10/22 Yes , Acie Fredrickson, FNP  fluticasone (FLONASE) 50 MCG/ACT nasal spray Place 1 spray into both nostrils daily. 09/03/22  Yes , Acie Fredrickson, FNP  cetirizine (ZYRTEC ALLERGY) 10 MG tablet Take 1 tablet (10 mg total) by mouth daily. 05/17/22   Wallis Bamberg, PA-C  fluconazole (DIFLUCAN) 150 MG tablet Take 1 tablet (150 mg total) by mouth every 3 (three) days. 05/17/22   Wallis Bamberg, PA-C  ibuprofen (ADVIL) 600 MG tablet Take 1 tablet (600 mg total) by mouth every 6 (six) hours. Patient not taking: Reported on 03/23/2022 11/21/21   Aviva Signs, CNM  megestrol (MEGACE) 40 MG tablet Take 1 tablet (40 mg total) by mouth 2 (two) times daily. Can increase to two tablets twice a day in the event of heavy bleeding 03/23/22   Lorriane Shire, MD  norethindrone (MICRONOR) 0.35 MG tablet Take 1 tablet (0.35 mg total) by mouth daily. 03/23/22   Lorriane Shire, MD  Prenatal Vit-Fe Fumarate-FA (M-NATAL PLUS PO) Take 1 tablet by mouth daily. Patient not taking: Reported on 03/23/2022    [provider]  pseudoephedrine (SUDAFED) 30 MG tablet Take 1 tablet (30 mg total) by mouth every 8 (eight) hours as needed for congestion. 05/17/22   Wallis Bamberg, PA-C    Family History Family History  Problem Relation Age of Onset   Hypertension Mother    Seizures Mother    Hypertension Maternal Grandmother    Arthritis Maternal Grandmother    Cancer Maternal Grandmother        breast   Stroke Maternal Grandfather    Seizures Brother    Birth defects Daughter        hole in heart   Seizures Maternal Aunt    Seizures Brother    Seizures Cousin    Seizures Cousin    Mental retardation Cousin     Social History Social History   Tobacco Use   Smoking status: Every Day    Current packs/day: 1.00    Average packs/day: 1 pack/day for 13.0 years  (13.0 ttl pk-yrs)    Types: Cigarettes   Smokeless tobacco: Never  Vaping Use   Vaping status: Never Used  Substance Use Topics   Alcohol use: Not Currently    Comment: 12/06/2018   Drug use: Yes    Types: Cocaine  Comment: last use 11/18/2021     Allergies   Levaquin [levofloxacin]   Review of Systems Review of Systems Per HPI  Physical Exam Triage Vital Signs ED Triage Vitals  Encounter Vitals Group     BP 09/03/22 1941 132/82     Systolic BP Percentile --      Diastolic BP Percentile --      Pulse Rate 09/03/22 1941 97     Resp 09/03/22 1941 16     Temp 09/03/22 1941 98.4 F (36.9 C)     Temp Source 09/03/22 1941 Oral     SpO2 09/03/22 1941 97 %     Weight --      Height --      Head Circumference --      Peak Flow --      Pain Score 09/03/22 1953 10     Pain Loc --      Pain Education --      Exclude from Growth Chart --    No data found.  Updated Vital Signs BP 132/82 (BP Location: Left Arm)   Pulse 97   Temp 98.4 F (36.9 C) (Oral)   Resp 16   LMP 06/24/2022 (Approximate)   SpO2 97%   Visual Acuity Right Eye Distance:   Left Eye Distance:   Bilateral Distance:    Right Eye Near:   Left Eye Near:    Bilateral Near:     Physical Exam Constitutional:      General: She is not in acute distress.    Appearance: Normal appearance. She is not toxic-appearing or diaphoretic.  HENT:     Head: Normocephalic and atraumatic.     Right Ear: Ear canal normal. A middle ear effusion is present. Tympanic membrane is not perforated, erythematous or bulging.     Left Ear: Ear canal normal. A middle ear effusion is present. Tympanic membrane is erythematous. Tympanic membrane is not perforated or bulging.     Nose: Congestion present.     Mouth/Throat:     Mouth: Mucous membranes are moist.     Dentition: Dental tenderness and dental caries present.     Pharynx: No posterior oropharyngeal erythema.     Comments: Possibly mild gingival swelling present  to left upper dentition.  Eyes:     Extraocular Movements: Extraocular movements intact.     Conjunctiva/sclera: Conjunctivae normal.     Pupils: Pupils are equal, round, and reactive to light.  Cardiovascular:     Rate and Rhythm: Normal rate and regular rhythm.     Pulses: Normal pulses.     Heart sounds: Normal heart sounds.  Pulmonary:     Effort: Pulmonary effort is normal. No respiratory distress.     Breath sounds: Normal breath sounds. No stridor. No wheezing, rhonchi or rales.  Abdominal:     General: Abdomen is flat. Bowel sounds are normal. There is no distension.     Palpations: Abdomen is soft.     Tenderness: There is no abdominal tenderness.  Musculoskeletal:        General: Normal range of motion.     Cervical back: Normal range of motion.     Comments: No tenderness to palpation to back.   Skin:    General: Skin is warm and dry.  Neurological:     General: No focal deficit present.     Mental Status: She is alert and oriented to person, place, and time. Mental status is at baseline.  Cranial Nerves: Cranial nerves 2-12 are intact.     Sensory: Sensation is intact.     Motor: Motor function is intact.     Coordination: Coordination is intact.     Gait: Gait is intact.  Psychiatric:        Mood and Affect: Mood normal.        Behavior: Behavior normal.      UC Treatments / Results  Labs (all labs ordered are listed, but only abnormal results are displayed) Labs Reviewed  POCT URINALYSIS DIP (MANUAL ENTRY) - Abnormal; Notable for the following components:      Result Value   Blood, UA trace-intact (*)    All other components within normal limits  POCT URINE PREGNANCY - Abnormal; Notable for the following components:   Preg Test, Ur Positive (*)    All other components within normal limits  URINE CULTURE  CERVICOVAGINAL ANCILLARY ONLY    EKG   Radiology No results found.  Procedures Procedures (including critical care time)  Medications  Ordered in UC Medications - No data to display  Initial Impression / Assessment and Plan / UC Course  I have reviewed the triage vital signs and the nursing notes.  Pertinent labs & imaging results that were available during my care of the patient were reviewed by me and considered in my medical decision making (see chart for details).     1.  Positive pregnancy test  Discussed with patient positive pregnancy test results.  She has been having intermittent abdominal and back pain but denies any current symptoms at this time.  She also denies any vaginal bleeding so do not think that emergent evaluation is necessary.  Although, discussed with patient that she will need to follow-up at the emergency department if any symptoms persist, occur, or worsen.  Advised her to follow-up with gynecology as soon as possible as well.  She states that she smokes cigarettes and occasionally does cocaine so advised her to avoid use during pregnancy and to start taking prenatal vitamins.  2.  Dysuria and urinary frequency  UA unremarkable for infection.  Suspect that vaginitis versus early pregnancy symptoms could be contributing to symptoms given vaginal discharge.  Therefore, vaginal swab is pending.  Will send urine culture as well given patient is currently pregnant to ensure that patient is not having any UTI in pregnancy especially given associated symptoms.  Patient declined blood work for HIV and syphilis.  3.  Nausea and vomiting  There are no signs of acute dehydration or acute abdomen on exam.  Suspect nausea and vomiting is related to early pregnancy symptoms and discussed this with patient.  Advised adequate fluid hydration and rest.  Neuroexam is normal which is reassuring as well.  4.  Left ear pain  patient does have a mild otitis media so will treat with amoxicillin given that this is safe with pregnancy.  Flonase also prescribed to help alleviate fluid behind TMs and nasal congestion.   Amoxicillin will treat any sinus infection associated with symptoms as well.  5.  Dental pain There is no obvious signs of dental infection or abscess on exam but amoxicillin should help treat this if it is present.  Advised her to follow-up with dentist as soon as possible for further evaluation and management of this.  Patient was given strict follow-up and ER precautions for all chief complaints today.  Patient verbalized understanding and was agreeable with plan.  Coding this is a level 4 given multiple chief  complaints addressed today.  Final Clinical Impressions(s) / UC Diagnoses   Final diagnoses:  Positive urine pregnancy test  Nausea and vomiting, unspecified vomiting type  Vaginal discharge  Screening examination for venereal disease  Other non-recurrent acute nonsuppurative otitis media of left ear  Pain, dental  Urinary frequency     Discharge Instructions      Your pregnancy test was positive.  Please avoid smoking and drug use.  Start taking prenatal vitamin and follow-up with OB/GYN as soon as possible.  If abdominal pain returns, please go straight to the emergency department for evaluation.  I have prescribed an antibiotic for ear infection and dental pain/infection.  Follow-up with dentist as soon as possible.  STD testing and urine culture pending.    ED Prescriptions     Medication Sig Dispense Auth. Provider   amoxicillin (AMOXIL) 875 MG tablet Take 1 tablet (875 mg total) by mouth 2 (two) times daily for 7 days. 14 tablet [place], [place], [place]   fluticasone [facility]) 50 MCG/ACT nasal spray Place 1 spray into both nostrils daily. 16 g Gustavus Bryant, [place]      PDMP not reviewed this encounter.   Gustavus Bryant, [place] 09/03/22 2026

## 2022-09-04 ENCOUNTER — Ambulatory Visit: Payer: Self-pay

## 2022-09-07 ENCOUNTER — Telehealth (HOSPITAL_COMMUNITY): Payer: Self-pay | Admitting: Emergency Medicine

## 2022-09-07 MED ORDER — METRONIDAZOLE 0.75 % VA GEL: 1.0000 | Freq: Every day | VAGINAL | 0 refills | Status: AC

## 2022-11-15 ENCOUNTER — Ambulatory Visit
Admission: EM | Admit: 2022-11-15 | Discharge: 2022-11-15 | Disposition: A | Discharge status: Home / Self Care | Payer: Medicaid Other | Attending: Internal Medicine | Admitting: Internal Medicine

## 2022-11-15 DIAGNOSIS — Z3201 Encounter for pregnancy test, result positive: Secondary | ICD-10-CM | POA: Diagnosis present

## 2022-11-15 DIAGNOSIS — N760 Acute vaginitis: Secondary | ICD-10-CM | POA: Insufficient documentation

## 2022-11-15 DIAGNOSIS — R300 Dysuria: Secondary | ICD-10-CM | POA: Insufficient documentation

## 2022-11-15 DIAGNOSIS — N3000 Acute cystitis without hematuria: Secondary | ICD-10-CM | POA: Diagnosis not present

## 2022-11-15 LAB — POCT URINALYSIS DIP (MANUAL ENTRY)
Bilirubin, UA: NEGATIVE
Blood, UA: NEGATIVE
Glucose, UA: NEGATIVE mg/dL
Ketones, POC UA: NEGATIVE mg/dL
Nitrite, UA: POSITIVE — AB
Protein Ur, POC: 30 mg/dL — AB
Spec Grav, UA: >=1.03 — AB (ref 1.010–1.025)
Urobilinogen, UA: 1 U/dL
pH, UA: 5.5 (ref 5.0–8.0)

## 2022-11-15 MED ORDER — CLOTRIMAZOLE 1 % VA CREA: 1.0000 | TOPICAL_CREAM | Freq: Every day | VAGINAL | 0 refills | Status: DC

## 2022-11-15 MED ORDER — CEPHALEXIN 500 MG PO CAPS: 500.0000 mg | ORAL_CAPSULE | Freq: Three times a day (TID) | ORAL | 0 refills | Status: DC

## 2022-11-15 NOTE — ED Provider Notes (Signed)
[facility]  Note:  This document was prepared using Conservation officer, historic buildings and may include unintentional dictation errors.  MRN: [id number] DOB: Jan 24, 1981  Subjective:   Erica [name] is a 42 y.o. female presenting for 2 week history of acute onset dysuria, urinary urgency, urinary frequency, dark urine. Has started to have vaginal itching and irritation. Would STI testing. LMP was in May, has not sought OB/GYN care.  No fever, vaginal bleeding, vaginal discharge. UPT was positive last visit here in Aug 2024.  No current facility-administered medications for this encounter.  Current Outpatient Medications:    cetirizine (ZYRTEC ALLERGY) 10 MG tablet, Take 1 tablet (10 mg total) by mouth daily., Disp: 30 tablet, Rfl: 0   fluconazole (DIFLUCAN) 150 MG tablet, Take 1 tablet (150 mg total) by mouth every 3 (three) days., Disp: 4 tablet, Rfl: 0   fluticasone (FLONASE) 50 MCG/ACT nasal spray, Place 1 spray into both nostrils daily., Disp: 16 g, Rfl: 0   ibuprofen (ADVIL) 600 MG tablet, Take 1 tablet (600 mg total) by mouth every 6 (six) hours. (Patient not taking: Reported on 03/23/2022), Disp: 30 tablet, Rfl: 0   megestrol (MEGACE) 40 MG tablet, Take 1 tablet (40 mg total) by mouth 2 (two) times daily. Can increase to two tablets twice a day in the event of heavy bleeding, Disp: 10 tablet, Rfl: 0   norethindrone (MICRONOR) 0.35 MG tablet, Take 1 tablet (0.35 mg total) by mouth daily., Disp: 60 tablet, Rfl: 11   Prenatal Vit-Fe Fumarate-FA (M-NATAL PLUS PO), Take 1 tablet by mouth daily. (Patient not taking: Reported on 03/23/2022), Disp: , Rfl:    pseudoephedrine (SUDAFED) 30 MG tablet, Take 1 tablet (30 mg total) by mouth every 8 (eight) hours as needed for congestion., Disp: 30 tablet, Rfl: 0   Allergies  Allergen Reactions   Levaquin [Levofloxacin] Hives, Swelling and Other (See Comments)    Lip swelling Chest tightness    Past Medical History:   Diagnosis Date   Abnormal Pap smear    Anemia    Anxiety    as teen   Anxiety    Depression    as teen   GERD (gastroesophageal reflux disease)    Headache(784.0)    History of inadequate prenatal care (no care x8 weeks) 05/23/2019   Feb 26 - May 22, 2019   History of successful vaginal birth after cesarean, currently pregnant 05/19/2011   Desires repeat c/s and BTL.   No pertinent past medical history    Normal labor and delivery    Pneumonia    Postpartum depression    Pregnancy induced hypertension    Preterm labor    Preterm uterine contractions    Term pregnancy 08/18/2019   Tobacco use in pregnancy, childbirth, or the puerperium, antepartum 03/23/2019   03/23/19: smokes 5-8 cpd, pt's stated goal is to reduce to 5 cpd    Vaginal birth after cesarean section 08/20/2019   Vaginal Pap smear, abnormal    VBAC, delivered, current hospitalization 05/19/2011     Past Surgical History:  Procedure Laterality Date   APPENDECTOMY     CESAREAN SECTION  2008   COLPOSCOPY     DILATION AND CURETTAGE OF UTERUS  2010   LAPAROSCOPIC APPENDECTOMY N/A 07/11/2016   Procedure: APPENDECTOMY LAPAROSCOPIC;  Surgeon: Ricarda Frame, MD;  Location: [facility] ORS;  Service: General;  Laterality: N/A;   WISDOM TOOTH EXTRACTION      Family History  Problem Relation Age of  Onset   Hypertension Mother    Seizures Mother    Hypertension Maternal Grandmother    Arthritis Maternal Grandmother    Cancer Maternal Grandmother        breast   Stroke Maternal Grandfather    Seizures Brother    Birth defects Daughter        hole in heart   Seizures Maternal Aunt    Seizures Brother    Seizures Cousin    Seizures Cousin    Mental retardation Cousin     Social History   Tobacco Use   Smoking status: Every Day    Current packs/day: 1.00    Average packs/day: 1 pack/day for 13.0 years (13.0 ttl pk-yrs)    Types: Cigarettes   Smokeless tobacco: Never  Vaping Use   Vaping status: Never Used   Substance Use Topics   Alcohol use: Not Currently    Comment: 12/06/2018   Drug use: Yes    Types: Cocaine    Comment: last use 11/18/2021    ROS   Objective:   Vitals: BP 128/79 (BP Location: Right Arm)   Pulse 94   Temp 98.2 F (36.8 C) (Oral)   Resp 16   LMP 06/24/2022 (Approximate)   SpO2 96%   Physical Exam Constitutional:      General: She is not in acute distress.    Appearance: Normal appearance. She is well-developed. She is not ill-appearing, toxic-appearing or diaphoretic.  HENT:     Head: Normocephalic and atraumatic.     Nose: Nose normal.     Mouth/Throat:     Mouth: Mucous membranes are moist.  Eyes:     General: No scleral icterus.       Right eye: No discharge.        Left eye: No discharge.     Extraocular Movements: Extraocular movements intact.     Conjunctiva/sclera: Conjunctivae normal.  Cardiovascular:     Rate and Rhythm: Normal rate.  Pulmonary:     Effort: Pulmonary effort is normal.  Abdominal:     General: Bowel sounds are normal. There is no distension.     Palpations: Abdomen is soft. There is no mass.     Tenderness: There is no abdominal tenderness. There is no right CVA tenderness, left CVA tenderness, guarding or rebound.  Skin:    General: Skin is warm and dry.  Neurological:     General: No focal deficit present.     Mental Status: She is alert and oriented to person, place, and time.  Psychiatric:        Mood and Affect: Mood normal.        Behavior: Behavior normal.        Thought Content: Thought content normal.        Judgment: Judgment normal.     Results for orders placed or performed during the hospital encounter of 11/15/22 (from the past 24 hour(s))  POCT urinalysis dipstick     Status: Abnormal   Collection Time: 11/15/22  4:17 PM  Result Value Ref Range   Color, UA yellow yellow   Clarity, UA cloudy (A) clear   Glucose, UA negative negative mg/dL   Bilirubin, UA negative negative   Ketones, POC UA  negative negative mg/dL   Spec Grav, UA >=1.610 (A) 1.010 - 1.025   Blood, UA negative negative   pH, UA 5.5 5.0 - 8.0   Protein Ur, POC =30 (A) negative mg/dL   Urobilinogen, UA 1.0 0.2  or 1.0 E.U./dL   Nitrite, UA Positive (A) Negative   Leukocytes, UA Small (1+) (A) Negative    Assessment and Plan :   PDMP not reviewed this encounter.  1. Acute cystitis without hematuria   2. Dysuria   3. Positive pregnancy test   4. Acute vaginitis    Start Keflex to cover for acute cystitis, urine culture pending.  Will also treat yeast vaginitis empirically with clotrimazole topically. Labs pending. Recommended aggressive hydration, limiting urinary irritants. Emphasized need for follow up with OB/GYN.  Counseled patient on potential for adverse effects with medications prescribed/recommended today, ER and return-to-clinic precautions discussed, patient verbalized understanding.    Wallis Bamberg, [place] 11/16/22 [phone]

## 2022-11-15 NOTE — ED Triage Notes (Addendum)
Pt c/o dysuria, foul smelling and dark urine x 2 weeks-pt states she is ~4 months pregnant/no prenatal care-NAD-steady gait-pt added c/o vaginal and itching x 2 weeks/requesting STD testing

## 2022-11-15 NOTE — Discharge Instructions (Addendum)
Please start cephalexin to address an urinary tract infection. Use clotrimazole vaginal cream for antibiotic associated yeast infection. Make sure you hydrate very well with plain water and a quantity of 64-80 ounces of water a day.  Please limit drinks that are considered urinary irritants such as soda, sweet tea, coffee, energy drinks, alcohol.  These can worsen your urinary and genital symptoms but also be the source of them.  I will let you know about your vaginal swab and urine culture results through MyChart to see if we need to prescribe or change your antibiotics based off of those results.

## 2022-11-16 ENCOUNTER — Telehealth: Payer: Self-pay

## 2022-11-16 LAB — CERVICOVAGINAL ANCILLARY ONLY
Bacterial Vaginitis (gardnerella): NEGATIVE
Candida Glabrata: NEGATIVE
Candida Vaginitis: POSITIVE — AB
Chlamydia: NEGATIVE
Comment: NEGATIVE
Comment: NEGATIVE
Comment: NEGATIVE
Comment: NEGATIVE
Comment: NEGATIVE
Comment: NORMAL
Neisseria Gonorrhea: NEGATIVE
Trichomonas: POSITIVE — AB

## 2022-11-16 MED ORDER — METRONIDAZOLE 500 MG PO TABS: 500.0000 mg | ORAL_TABLET | Freq: Two times a day (BID) | ORAL | 0 refills | Status: AC

## 2022-11-16 NOTE — Telephone Encounter (Signed)
Per protocol, pt requires tx with metronidazole. Pt went home on clotrimazole.  Attempted to reach patient x1. LVM.  Rx sent to pharmacy on file.

## 2022-11-17 LAB — URINE CULTURE: Culture: >=100000 — AB

## 2022-11-18 ENCOUNTER — Ambulatory Visit
Admission: EM | Admit: 2022-11-18 | Discharge: 2022-11-18 | Disposition: A | Discharge status: Home / Self Care | Payer: Medicaid Other | Attending: Family Medicine | Admitting: Family Medicine

## 2022-11-18 ENCOUNTER — Encounter: Payer: Self-pay | Admitting: *Deleted

## 2022-11-18 ENCOUNTER — Other Ambulatory Visit: Payer: Self-pay

## 2022-11-18 DIAGNOSIS — H1033 Unspecified acute conjunctivitis, bilateral: Secondary | ICD-10-CM | POA: Diagnosis present

## 2022-11-18 MED ORDER — NAPHAZOLINE-PHENIRAMINE 0.025-0.3 % OP SOLN: 1.0000 [drp] | Freq: Four times a day (QID) | OPHTHALMIC | 0 refills | Status: DC | PRN

## 2022-11-18 MED ORDER — TOBRAMYCIN 0.3 % OP SOLN: 1.0000 [drp] | Freq: Four times a day (QID) | OPHTHALMIC | 0 refills | Status: DC

## 2022-11-18 NOTE — ED Triage Notes (Signed)
States woke up with both eyes red, itchy, and watery. States eyelashes were sticky. Pt is pregnant, LMP in May-

## 2022-11-18 NOTE — ED Provider Notes (Signed)
[facility]   [id number] 11/18/22 Arrival Time: 1925  ASSESSMENT & PLAN:  1. Acute conjunctivitis of both eyes, unspecified acute conjunctivitis type     Meds ordered this encounter  Medications   tobramycin (TOBREX) 0.3 % ophthalmic solution    Sig: Place 1 drop into the right eye every 6 (six) hours.    Dispense:  5 mL    Refill:  0   naphazoline-pheniramine (NAPHCON-A) 0.025-0.3 % ophthalmic solution    Sig: Place 1 drop into both eyes 4 (four) times daily as needed for eye irritation.    Dispense:  15 mL    Refill:  0    Warm compress to eye(s). Local eye care discussed.  Reviewed expectations re: course of current medical issues. Questions answered. Outlined signs and symptoms indicating need for more acute intervention. Patient verbalized understanding. After Visit Summary given.   SUBJECTIVE:  Erica [name] is a 42 y.o. female who presents with complaint of bilateral eye irritation; noted today; itchy/gritty. Denies fever/recent illness. Injury: no. Visual changes: no. Contact lens use: no. Recent illness: no. Self treatment: none.   OBJECTIVE:  General appearance: alert; no distress HEENT: [place]; AT; PERRLA; no restriction of the extraocular movements OU: without reported pain; with conjunctival injection; with watery drainage; without corneal opacities; without limbal flush; without periorbital swelling or erythema Skin: warm and dry Psychological: alert and cooperative; normal mood and affect   Allergies  Allergen Reactions   Levaquin [Levofloxacin] Hives, Swelling and Other (See Comments)    Lip swelling Chest tightness    Past Medical History:  Diagnosis Date   Abnormal Pap smear    Anemia    Anxiety    as teen   Anxiety    Depression    as teen   GERD (gastroesophageal reflux disease)    Headache(784.0)    History of inadequate prenatal care (no care x8 weeks) 05/23/2019   Feb 26 - May 22, 2019   History of successful vaginal  birth after cesarean, currently pregnant 05/19/2011   Desires repeat c/s and BTL.   No pertinent past medical history    Normal labor and delivery    Pneumonia    Postpartum depression    Pregnancy induced hypertension    Preterm labor    Preterm uterine contractions    Term pregnancy 08/18/2019   Tobacco use in pregnancy, childbirth, or the puerperium, antepartum 03/23/2019   03/23/19: smokes 5-8 cpd, pt's stated goal is to reduce to 5 cpd    Vaginal birth after cesarean section 08/20/2019   Vaginal Pap smear, abnormal    VBAC, delivered, current hospitalization 05/19/2011   Social History   Socioeconomic History   Marital status: Widowed    Spouse name: Not on file   Number of children: 7   Years of education: 10   Highest education level: GED or equivalent  Occupational History   Occupation: unemployeed  Tobacco Use   Smoking status: Every Day    Current packs/day: 1.00    Average packs/day: 1 pack/day for 13.0 years (13.0 ttl pk-yrs)    Types: Cigarettes   Smokeless tobacco: Never  Vaping Use   Vaping status: Never Used  Substance and Sexual Activity   Alcohol use: Not Currently    Comment: occ   Drug use: Yes    Types: Cocaine   Sexual activity: Not Currently  Other Topics Concern   Not on file  Social History Narrative   Not on file   Social  Determinants of Health   Financial Resource Strain: Low Risk  (05/25/2021)   Overall Financial Resource Strain (CARDIA)    Difficulty of Paying Living Expenses: Not hard at all  Food Insecurity: No Food Insecurity (11/20/2021)   Hunger Vital Sign    Worried About Running Out of Food in the Last Year: Never true    Ran Out of Food in the Last Year: Never true  Transportation Needs: No Transportation Needs (11/20/2021)   PRAPARE - Administrator, Civil Service (Medical): No    Lack of Transportation (Non-Medical): No  Physical Activity: Unknown (05/25/2021)   Exercise Vital Sign    Days of Exercise per Week: 7 days     Minutes of Exercise per Session: Not on file  Stress: No Stress Concern Present (05/25/2021)   [employer] of Occupational Health - Occupational Stress Questionnaire    Feeling of Stress : Not at all  Social Connections: Moderately Isolated (05/25/2021)   Social Connection and Isolation Panel [NHANES]    Frequency of Communication with Friends and Family: More than three times a week    Frequency of Social Gatherings with Friends and Family: More than three times a week    Attends Religious Services: 1 to 4 times per year    Active Member of [employer] or Organizations: No    Attends Banker Meetings: Never    Marital Status: Widowed  Intimate Partner Violence: Not At Risk (11/20/2021)   Humiliation, Afraid, Rape, and Kick questionnaire    Fear of Current or Ex-Partner: No    Emotionally Abused: No    Physically Abused: No    Sexually Abused: No   Family History  Problem Relation Age of Onset   Hypertension Mother    Seizures Mother    Hypertension Maternal Grandmother    Arthritis Maternal Grandmother    Cancer Maternal Grandmother        breast   Stroke Maternal Grandfather    Seizures Brother    Birth defects Daughter        hole in heart   Seizures Maternal Aunt    Seizures Brother    Seizures Cousin    Seizures Cousin    Mental retardation Cousin    Past Surgical History:  Procedure Laterality Date   APPENDECTOMY     CESAREAN SECTION  2008   COLPOSCOPY     DILATION AND CURETTAGE OF UTERUS  2010   LAPAROSCOPIC APPENDECTOMY N/A 07/11/2016   Procedure: APPENDECTOMY LAPAROSCOPIC;  Surgeon: Ricarda Frame, MD;  Location: [facility] ORS;  Service: General;  Laterality: N/A;   WISDOM TOOTH EXTRACTION        Mardella Layman, MD 11/18/22 1952

## 2022-11-19 ENCOUNTER — Telehealth (HOSPITAL_COMMUNITY): Payer: Self-pay | Admitting: Emergency Medicine

## 2022-11-19 NOTE — Telephone Encounter (Signed)
Opened in error

## 2022-11-23 ENCOUNTER — Ambulatory Visit
Admission: EM | Admit: 2022-11-23 | Discharge: 2022-11-23 | Disposition: A | Discharge status: Home / Self Care | Payer: Medicaid Other | Attending: Internal Medicine | Admitting: Internal Medicine

## 2022-11-23 DIAGNOSIS — H1033 Unspecified acute conjunctivitis, bilateral: Secondary | ICD-10-CM

## 2022-11-23 MED ORDER — LORATADINE 10 MG PO TABS
10.0000 mg | ORAL_TABLET | Freq: Every day | ORAL | 0 refills | Status: DC
Start: 2022-11-23 — End: 2023-04-12

## 2022-11-23 MED ORDER — ERYTHROMYCIN 5 MG/GM OP OINT
TOPICAL_OINTMENT | OPHTHALMIC | 0 refills | Status: DC
Start: 2022-11-23 — End: 2023-01-02

## 2022-11-23 NOTE — ED Provider Notes (Signed)
[facility]    [name]: [id number] Arrival date & time: 11/23/22  1113      History   Chief Complaint No chief complaint on file.   HPI Erica [name] is a 42 y.o. female presents for eye drainage.  Patient reports 5 days of bilateral eye redness, swelling, watery/sticky drainage, blurry vision, and photophobia.  Denies any cough or cold symptoms or allergy symptoms.  No injury to the eye.  Does not wear glasses or contacts.  Was seen in urgent care on 10/24 for same complaint.  Was prescribed tobramycin antibiotic drops as well as antihistamine drops.  Patient states she only picked up the antibiotic drops and states she has had no improvement with this.  She is currently 3 to 4 months pregnant.  No OTC medications have been used.  No other concerns at this time.  HPI  Past Medical History:  Diagnosis Date   Abnormal Pap smear    Anemia    Anxiety    as teen   Anxiety    Depression    as teen   GERD (gastroesophageal reflux disease)    Headache(784.0)    History of inadequate prenatal care (no care x8 weeks) 05/23/2019   Feb 26 - May 22, 2019   History of successful vaginal birth after cesarean, currently pregnant 05/19/2011   Desires repeat c/s and BTL.   No pertinent past medical history    Normal labor and delivery    Pneumonia    Postpartum depression    Pregnancy induced hypertension    Preterm labor    Preterm uterine contractions    Term pregnancy 08/18/2019   Tobacco use in pregnancy, childbirth, or the puerperium, antepartum 03/23/2019   03/23/19: smokes 5-8 cpd, pt's stated goal is to reduce to 5 cpd    Vaginal birth after cesarean section 08/20/2019   Vaginal Pap smear, abnormal    VBAC, delivered, current hospitalization 05/19/2011    Patient Active Problem List   Diagnosis Date Noted   History of cesarean delivery 11/18/2021   History of VBAC 11/18/2021   Unwanted fertility 11/18/2021   History of substance abuse (HCC) 03/23/2019    Past Surgical  History:  Procedure Laterality Date   APPENDECTOMY     CESAREAN SECTION  2008   COLPOSCOPY     DILATION AND CURETTAGE OF UTERUS  2010   LAPAROSCOPIC APPENDECTOMY N/A 07/11/2016   Procedure: APPENDECTOMY LAPAROSCOPIC;  Surgeon: Ricarda Frame, MD;  Location: [facility] ORS;  Service: General;  Laterality: N/A;   WISDOM TOOTH EXTRACTION      OB History     Gravida  13   Para  8   Term  5   Preterm  3   AB  3   Living  8      SAB  2   IAB  0   Ectopic  1   Multiple  0   Live Births  8            Home Medications    Prior to Admission medications   Medication Sig Start Date End Date Taking? Authorizing Provider  cephALEXin (KEFLEX) 500 MG capsule Take 1 capsule (500 mg total) by mouth 3 (three) times daily. 11/15/22  Yes Wallis Bamberg, PA-C  erythromycin ophthalmic ointment Place a 1/2 inch ribbon of ointment into the lower eyelid bilaterally twice daily for 7 days. 11/23/22  Yes Radford Pax, NP  loratadine (CLARITIN) 10 MG tablet Take 1 tablet (10 mg  total) by mouth daily for 14 days. 11/23/22 12/07/22 Yes Radford Pax, NP  metroNIDAZOLE (FLAGYL) 500 MG tablet Take 1 tablet (500 mg total) by mouth 2 (two) times daily for 7 days. 11/16/22 11/23/22 Yes Lamptey, Britta Mccreedy, MD  clotrimazole (GYNE-LOTRIMIN) 1 % vaginal cream Place 1 Applicatorful vaginally at bedtime. 11/15/22   Wallis Bamberg, PA-C  fluticasone (FLONASE) 50 MCG/ACT nasal spray Place 1 spray into both nostrils daily. 09/03/22   Gustavus Bryant, FNP  ibuprofen (ADVIL) 600 MG tablet Take 1 tablet (600 mg total) by mouth every 6 (six) hours. Patient not taking: Reported on 03/23/2022 11/21/21   Aviva Signs, CNM  megestrol (MEGACE) 40 MG tablet Take 1 tablet (40 mg total) by mouth 2 (two) times daily. Can increase to two tablets twice a day in the event of heavy bleeding 03/23/22   Lorriane Shire, MD  naphazoline-pheniramine (NAPHCON-A) 0.025-0.3 % ophthalmic solution Place 1 drop into both eyes 4 (four)  times daily as needed for eye irritation. 11/18/22   Mardella Layman, MD  norethindrone (MICRONOR) 0.35 MG tablet Take 1 tablet (0.35 mg total) by mouth daily. 03/23/22   Lorriane Shire, MD  Prenatal Vit-Fe Fumarate-FA (M-NATAL PLUS PO) Take 1 tablet by mouth daily. Patient not taking: Reported on 03/23/2022    [provider]  pseudoephedrine (SUDAFED) 30 MG tablet Take 1 tablet (30 mg total) by mouth every 8 (eight) hours as needed for congestion. 05/17/22   Wallis Bamberg, PA-C    Family History Family History  Problem Relation Age of Onset   Hypertension Mother    Seizures Mother    Hypertension Maternal Grandmother    Arthritis Maternal Grandmother    Cancer Maternal Grandmother        breast   Stroke Maternal Grandfather    Seizures Brother    Birth defects Daughter        hole in heart   Seizures Maternal Aunt    Seizures Brother    Seizures Cousin    Seizures Cousin    Mental retardation Cousin     Social History Social History   Tobacco Use   Smoking status: Every Day    Current packs/day: 1.00    Average packs/day: 1 pack/day for 13.0 years (13.0 ttl pk-yrs)    Types: Cigarettes   Smokeless tobacco: Never  Vaping Use   Vaping status: Never Used  Substance Use Topics   Alcohol use: Not Currently    Comment: occ   Drug use: Yes    Types: Cocaine     Allergies   Levaquin [levofloxacin]   Review of Systems Review of Systems  Eyes:  Positive for photophobia, discharge, redness and itching.     Physical Exam Triage Vital Signs ED Triage Vitals  Encounter Vitals Group     BP 11/23/22 1154 132/81     Systolic BP Percentile --      Diastolic BP Percentile --      Pulse Rate 11/23/22 1154 (!) 110     Resp 11/23/22 1154 18     Temp 11/23/22 1154 98 F (36.7 C)     Temp Source 11/23/22 1154 Oral     SpO2 11/23/22 1154 96 %     Weight --      Height --      Head Circumference --      Peak Flow --      Pain Score 11/23/22 1152 4     Pain Loc  --  Pain Education --      Exclude from Growth Chart --    No data found.  Updated Vital Signs BP 132/81 (BP Location: Right Arm)   Pulse (!) 110   Temp 98 F (36.7 C) (Oral)   Resp 18   LMP 06/24/2022 (Approximate)   SpO2 96%   Visual Acuity Right Eye Distance: 20/50 Left Eye Distance: 20/50 Bilateral Distance: 20/40  Right Eye Near:   Left Eye Near:    Bilateral Near:     Physical Exam Vitals and nursing note reviewed.  Constitutional:      Appearance: Normal appearance.  HENT:     Head: Normocephalic and atraumatic.  Eyes:     General:        Right eye: No foreign body or hordeolum.        Left eye: No foreign body or hordeolum.     Extraocular Movements:     Right eye: Normal extraocular motion and no nystagmus.     Left eye: Normal extraocular motion and no nystagmus.     Conjunctiva/sclera:     Right eye: Right conjunctiva is injected. No chemosis, exudate or hemorrhage.    Left eye: Left conjunctiva is injected. No chemosis, exudate or hemorrhage.    Pupils: Pupils are equal, round, and reactive to light.     Comments: Watery drainage noted with scant amount of yellow discharge noted.  There is no periorbital swelling or erythema  Cardiovascular:     Rate and Rhythm: Normal rate.  Pulmonary:     Effort: Pulmonary effort is normal.  Skin:    General: Skin is warm and dry.  Neurological:     General: No focal deficit present.     Mental Status: She is alert and oriented to person, place, and time.  Psychiatric:        Mood and Affect: Mood normal.        Behavior: Behavior normal.    Visual Acuity Bilateral Distance: 20/40 R Distance: 20/50 L Distance: 20/50   UC Treatments / Results  Labs (all labs ordered are listed, but only abnormal results are displayed) Labs Reviewed - No data to display  EKG   Radiology No results found.  Procedures Procedures (including critical care time)  Medications Ordered in UC Medications - No data to  display  Initial Impression / Assessment and Plan / UC Course  I have reviewed the triage vital signs and the nursing notes.  Pertinent labs & imaging results that were available during my care of the patient were reviewed by me and considered in my medical decision making (see chart for details).     I reviewed exam and symptoms with patient in length.  Visual acuity stable.  Discussed difference between bacterial and viral/allergic conjunctivitis.  Patient is insistent she has bacterial conjunctivitis as her roommate now has the same symptoms.  She states the tobramycin she was given is not effective and is requesting additional antibiotics.  Will do erythromycin ointment as pt is pregnant.  Also instructed patient to pick up the antihistamine eyedrops she was previously prescribed and to start those.  Will also start her on Claritin daily for 14 days.  She was instructed follow-up with ophthalmology if symptoms do not improve/or worsen in the next 2 days and she verbalized understanding. Final Clinical Impressions(s) / UC Diagnoses   Final diagnoses:  Acute conjunctivitis of both eyes, unspecified acute conjunctivitis type     Discharge Instructions  Start erythromycin ointment twice daily as prescribed.  Also please pick up the antihistamine eyedrops that were prescribed to you previously and start this as well.  I will also send you Claritin allergy medicine that you will take once daily for the next 14 days.  Cool compresses to the eyes as needed.  Please follow-up with ophthalmology if your symptoms do not improve.  Please go to the ER for any worsening symptoms.  I hope you feel better soon!    ED Prescriptions     Medication Sig Dispense Auth. Provider   erythromycin ophthalmic ointment Place a 1/2 inch ribbon of ointment into the lower eyelid bilaterally twice daily for 7 days. 3.5 g Radford Pax, NP   loratadine (CLARITIN) 10 MG tablet Take 1 tablet (10 mg total) by mouth  daily for 14 days. 14 tablet Radford Pax, NP      PDMP not reviewed this encounter.   Radford Pax, NP 11/23/22 1234

## 2022-11-23 NOTE — Discharge Instructions (Signed)
Start erythromycin ointment twice daily as prescribed.  Also please pick up the antihistamine eyedrops that were prescribed to you previously and start this as well.  I will also send you Claritin allergy medicine that you will take once daily for the next 14 days.  Cool compresses to the eyes as needed.  Please follow-up with ophthalmology if your symptoms do not improve.  Please go to the ER for any worsening symptoms.  I hope you feel better soon!

## 2022-11-23 NOTE — ED Triage Notes (Signed)
Pt presents to UC w/ c/o bilateral eye redness and swelling x5 days. Pt reports light sensitivity and blurry vision. Reports "watery, sticky drainage." Abx eye drops from previous visit are not helping Pt reports not taking the allergy eye drops prescribed at last visit.

## 2022-11-25 ENCOUNTER — Encounter (HOSPITAL_COMMUNITY): Payer: Self-pay

## 2022-11-25 ENCOUNTER — Inpatient Hospital Stay (HOSPITAL_COMMUNITY)
Admission: AD | Admit: 2022-11-25 | Discharge: 2022-11-25 | Discharge status: Against Medical Advice | Payer: Medicaid Other | Attending: Obstetrics and Gynecology | Admitting: Obstetrics and Gynecology

## 2022-11-25 DIAGNOSIS — Z3A22 22 weeks gestation of pregnancy: Secondary | ICD-10-CM | POA: Diagnosis not present

## 2022-11-25 DIAGNOSIS — H53149 Visual discomfort, unspecified: Secondary | ICD-10-CM | POA: Insufficient documentation

## 2022-11-25 DIAGNOSIS — O0932 Supervision of pregnancy with insufficient antenatal care, second trimester: Secondary | ICD-10-CM | POA: Diagnosis not present

## 2022-11-25 DIAGNOSIS — H538 Other visual disturbances: Secondary | ICD-10-CM | POA: Diagnosis present

## 2022-11-25 DIAGNOSIS — F1490 Cocaine use, unspecified, uncomplicated: Secondary | ICD-10-CM | POA: Diagnosis present

## 2022-11-25 DIAGNOSIS — O26892 Other specified pregnancy related conditions, second trimester: Secondary | ICD-10-CM | POA: Insufficient documentation

## 2022-11-25 NOTE — MAU Provider Note (Signed)
Event Date/Time   First Provider Initiated Contact with Patient 11/25/22 1214      S Ms. Erica [name] is a 42 y.o. [id number] patient who presents to MAU today with complaint of bilateral eye swelling.  Denies any OB/[place] complaints  O BP 125/69 (BP Location: Right Arm)   Pulse 87   Temp 97.9 F (36.6 C) (Oral)   Resp 16   Ht 5\' 4"  (1.626 m)   Wt 68.1 kg   LMP 06/24/2022 (Approximate)   SpO2 99%   BMI 25.78 kg/m  Physical Exam Vitals reviewed.  HENT:     Head: Normocephalic and atraumatic.  Eyes:     General:        Right eye: Discharge present.        Left eye: Discharge present.    Comments: Injected conjunctivae bilaterally  Neurological:     Mental Status: She is alert.    A Medical screening exam complete  P Discharge from MAU in stable condition Patient given the option of transfer to [facility] for further evaluation or seek care in outpatient facility of choice  Warning signs for worsening condition that would warrant emergency follow-up discussed Patient may return to MAU as needed   Celedonio Savage, MD 11/25/2022 12:23 PM

## 2022-11-25 NOTE — ED Notes (Signed)
No answer x3  Not in restroom or triage

## 2022-11-25 NOTE — MAU Note (Signed)
Patient being transferred to [facility].

## 2022-11-25 NOTE — MAU Note (Addendum)
.  Erica [name] is a 42 y.o. at approximately [redacted]w[redacted]d here in MAU reporting: Bilateral draining and swelling of her eyes since 10/24. She reports blurred vision and photophobia. She reports the swelling is worse and she is experiencing sharp pains in her eyes and burning. She reports when she leans forward the pressure in her forehead and her eyes is immense. Seen in Urgent Care on 10/24 and in the ED on 10/29 for the same complaint. She was prescribed Naphcon-A drops and Tobrex solution from UC and Erythromycin Ointment as well as Claritin from the ED. She reports she is taking all of her medications with no relief. She reports this has never happened before.  Reports using cocaine. Last used today.  Has not established OB care. MD made aware.  LMP: 06/24/2022 - approximate Onset of complaint: 10/24 Pain score: 10/10 eyes  FHT: 140 doppler Lab orders placed from triage: none

## 2023-01-02 ENCOUNTER — Ambulatory Visit
Admission: EM | Admit: 2023-01-02 | Discharge: 2023-01-02 | Disposition: A | Discharge status: Home / Self Care | Payer: Medicaid Other | Attending: Emergency Medicine | Admitting: Emergency Medicine

## 2023-01-02 DIAGNOSIS — O99332 Smoking (tobacco) complicating pregnancy, second trimester: Secondary | ICD-10-CM | POA: Insufficient documentation

## 2023-01-02 DIAGNOSIS — F17210 Nicotine dependence, cigarettes, uncomplicated: Secondary | ICD-10-CM | POA: Insufficient documentation

## 2023-01-02 DIAGNOSIS — O99512 Diseases of the respiratory system complicating pregnancy, second trimester: Secondary | ICD-10-CM | POA: Diagnosis not present

## 2023-01-02 DIAGNOSIS — O99322 Drug use complicating pregnancy, second trimester: Secondary | ICD-10-CM | POA: Diagnosis not present

## 2023-01-02 DIAGNOSIS — O99320 Drug use complicating pregnancy, unspecified trimester: Secondary | ICD-10-CM | POA: Diagnosis not present

## 2023-01-02 DIAGNOSIS — F1490 Cocaine use, unspecified, uncomplicated: Secondary | ICD-10-CM | POA: Insufficient documentation

## 2023-01-02 DIAGNOSIS — O99282 Endocrine, nutritional and metabolic diseases complicating pregnancy, second trimester: Secondary | ICD-10-CM | POA: Diagnosis not present

## 2023-01-02 DIAGNOSIS — N3000 Acute cystitis without hematuria: Secondary | ICD-10-CM

## 2023-01-02 DIAGNOSIS — F1910 Other psychoactive substance abuse, uncomplicated: Secondary | ICD-10-CM

## 2023-01-02 DIAGNOSIS — R0981 Nasal congestion: Secondary | ICD-10-CM | POA: Insufficient documentation

## 2023-01-02 DIAGNOSIS — Z3A27 27 weeks gestation of pregnancy: Secondary | ICD-10-CM

## 2023-01-02 DIAGNOSIS — J3489 Other specified disorders of nose and nasal sinuses: Secondary | ICD-10-CM | POA: Insufficient documentation

## 2023-01-02 DIAGNOSIS — O26892 Other specified pregnancy related conditions, second trimester: Secondary | ICD-10-CM

## 2023-01-02 DIAGNOSIS — O2312 Infections of bladder in pregnancy, second trimester: Secondary | ICD-10-CM

## 2023-01-02 DIAGNOSIS — O09522 Supervision of elderly multigravida, second trimester: Secondary | ICD-10-CM | POA: Insufficient documentation

## 2023-01-02 DIAGNOSIS — A549 Gonococcal infection, unspecified: Secondary | ICD-10-CM

## 2023-01-02 DIAGNOSIS — E860 Dehydration: Secondary | ICD-10-CM | POA: Diagnosis not present

## 2023-01-02 HISTORY — DX: Gonococcal infection, unspecified: A54.9

## 2023-01-02 LAB — POCT URINALYSIS DIP (MANUAL ENTRY)
Blood, UA: NEGATIVE
Glucose, UA: NEGATIVE mg/dL
Nitrite, UA: NEGATIVE
Protein Ur, POC: 30 mg/dL — AB
Spec Grav, UA: >=1.03 — AB (ref 1.010–1.025)
Urobilinogen, UA: 0.2 U/dL
pH, UA: 6 (ref 5.0–8.0)

## 2023-01-02 LAB — POCT FASTING CBG KUC MANUAL ENTRY: POCT Glucose (KUC): 86 mg/dL (ref 70–99)

## 2023-01-02 MED ORDER — AMOXICILLIN-POT CLAVULANATE 875-125 MG PO TABS: 1.0000 | ORAL_TABLET | Freq: Two times a day (BID) | ORAL | 0 refills | Status: DC

## 2023-01-02 MED ORDER — METRONIDAZOLE 500 MG PO TABS: 500.0000 mg | ORAL_TABLET | Freq: Two times a day (BID) | ORAL | 0 refills | Status: DC

## 2023-01-02 NOTE — ED Notes (Signed)
Possible Abnormal ECG/EKG, Rhythm strip ran for provider to review.

## 2023-01-02 NOTE — ED Triage Notes (Signed)
"  I am pregnancy and experiencing dizziness for a few days now". "I think it might be a UTI or a sinus infection". "I have had a cold with cough/congestion for over a week now". No fever. "I feel a little sob at times". "I am having intermittent nausea/vomiting, last emesis this morning around 10am". No chest pain.

## 2023-01-02 NOTE — Discharge Instructions (Signed)
Take medications as order.  Follow up in ER for any chest pains or continued dizziness. Drink 3 liters of water daily

## 2023-01-02 NOTE — ED Provider Notes (Incomplete)
[name]    [name]: [id number] Arrival date & time: 01/02/23  1200      History   Chief Complaint Chief Complaint  Patient presents with   Near Syncope    HPI Erica [name] is a 42 y.o. female.   Patient is presenting with multiple complaints at this time.  She states that she is dizzy and she possibly has a UTI along with continued ear infection.  She states that she was given antibiotics in the past but never completed them..  She states she has burning with urination, a fishy odor, along with discharge.  The history is provided by the patient.  Near Syncope This is a recurrent problem. Associated symptoms include headaches. She has tried nothing for the symptoms.    Past Medical History:  Diagnosis Date   Abnormal Pap smear    Anemia    Anxiety    as teen   Anxiety    Depression    as teen   GERD (gastroesophageal reflux disease)    Headache(784.0)    History of inadequate prenatal care (no care x8 weeks) 05/23/2019   Feb 26 - May 22, 2019   History of successful vaginal birth after cesarean, currently pregnant 05/19/2011   Desires repeat c/s and BTL.   Normal labor and delivery    Pneumonia    Postpartum depression    Pregnancy induced hypertension    Preterm labor    Preterm uterine contractions    Term pregnancy 08/18/2019   Tobacco use in pregnancy, childbirth, or the puerperium, antepartum 03/23/2019   03/23/19: smokes 5-8 cpd, pt's stated goal is to reduce to 5 cpd    Vaginal birth after cesarean section 08/20/2019   Vaginal Pap smear, abnormal    VBAC, delivered, current hospitalization 05/19/2011    Patient Active Problem List   Diagnosis Date Noted   History of cesarean delivery 11/18/2021   History of VBAC 11/18/2021   Unwanted fertility 11/18/2021   History of substance abuse (HCC) 03/23/2019    Past Surgical History:  Procedure Laterality Date   APPENDECTOMY     CESAREAN SECTION  2008   COLPOSCOPY     DILATION AND  CURETTAGE OF UTERUS  2010   LAPAROSCOPIC APPENDECTOMY N/A 07/11/2016   Procedure: APPENDECTOMY LAPAROSCOPIC;  Surgeon: Ricarda Frame, MD;  Location: [facility] ORS;  Service: General;  Laterality: N/A;   WISDOM TOOTH EXTRACTION      OB History     Gravida  13   Para  8   Term  5   Preterm  3   AB  3   Living  8      SAB  2   IAB  0   Ectopic  1   Multiple  0   Live Births  8            Home Medications    Prior to Admission medications   Medication Sig Start Date End Date Taking? Authorizing Provider  metroNIDAZOLE (METROGEL) 0.75 % vaginal gel Place 1 Applicatorful vaginally at bedtime. 09/23/22  Yes [provider]  cephALEXin (KEFLEX) 500 MG capsule Take 1 capsule (500 mg total) by mouth 3 (three) times daily. 11/15/22   Wallis Bamberg, PA-C  clotrimazole (GYNE-LOTRIMIN) 1 % vaginal cream Place 1 Applicatorful vaginally at bedtime. 11/15/22   Wallis Bamberg, PA-C  erythromycin ophthalmic ointment Place a 1/2 inch ribbon of ointment into the lower eyelid bilaterally twice daily for 7 days. 11/23/22   Stacie Acres,  Hipolito Bayley, NP  fluticasone (FLONASE) 50 MCG/ACT nasal spray Place 1 spray into both nostrils daily. 09/03/22   Gustavus Bryant, FNP  ibuprofen (ADVIL) 600 MG tablet Take 1 tablet (600 mg total) by mouth every 6 (six) hours. Patient not taking: Reported on 03/23/2022 11/21/21   Aviva Signs, CNM  loratadine (CLARITIN) 10 MG tablet Take 1 tablet (10 mg total) by mouth daily for 14 days. 11/23/22 12/07/22  Radford Pax, NP  megestrol (MEGACE) 40 MG tablet Take 1 tablet (40 mg total) by mouth 2 (two) times daily. Can increase to two tablets twice a day in the event of heavy bleeding 03/23/22   Lorriane Shire, MD  naphazoline-pheniramine (NAPHCON-A) 0.025-0.3 % ophthalmic solution Place 1 drop into both eyes 4 (four) times daily as needed for eye irritation. 11/18/22   Mardella Layman, MD  norethindrone (MICRONOR) 0.35 MG tablet Take 1 tablet (0.35 mg total) by  mouth daily. 03/23/22   Lorriane Shire, MD  Prenatal Vit-Fe Fumarate-FA (M-NATAL PLUS PO) Take 1 tablet by mouth daily. Patient not taking: Reported on 03/23/2022    [provider]  pseudoephedrine (SUDAFED) 30 MG tablet Take 1 tablet (30 mg total) by mouth every 8 (eight) hours as needed for congestion. 05/17/22   Wallis Bamberg, PA-C    Family History Family History  Problem Relation Age of Onset   Hypertension Mother    Seizures Mother    Seizures Brother    Seizures Brother    Hypertension Maternal Grandmother    Arthritis Maternal Grandmother    Cancer Maternal Grandmother        breast   Stroke Maternal Grandfather    Birth defects Daughter        hole in heart   Seizures Maternal Aunt    Seizures Cousin    Seizures Cousin    Mental retardation Cousin     Social History Social History   Tobacco Use   Smoking status: Every Day    Current packs/day: 1.00    Average packs/day: 1 pack/day for 13.0 years (13.0 ttl pk-yrs)    Types: Cigarettes   Smokeless tobacco: Never  Vaping Use   Vaping status: Never Used  Substance Use Topics   Alcohol use: Not Currently    Comment: occ   Drug use: Yes    Types: Cocaine    Comment: Last used: Today.     Allergies   Levaquin [levofloxacin]   Review of Systems Review of Systems  Constitutional:  Positive for fatigue.  HENT:  Positive for congestion, ear pain and sore throat.   Cardiovascular:  Positive for near-syncope.  Genitourinary:  Positive for dysuria and vaginal discharge.  Neurological:  Positive for dizziness and headaches.  All other systems reviewed and are negative.    Physical Exam Triage Vital Signs ED Triage Vitals  Encounter Vitals Group     BP --      Systolic BP Percentile --      Diastolic BP Percentile --      Pulse Rate 01/02/23 1218 96     Resp 01/02/23 1218 18     Temp 01/02/23 1218 98.6 F (37 C)     Temp Source 01/02/23 1218 Oral     SpO2 01/02/23 1218 99 %     Weight  01/02/23 1215 155 lb (70.3 kg)     Height 01/02/23 1215 5\' 3"  (1.6 m)     Head Circumference --      Peak Flow --  Pain Score 01/02/23 1213 0     Pain Loc --      Pain Education --      Exclude from Growth Chart --    Orthostatic VS for the past 24 hrs:  BP- Lying Pulse- Lying BP- Standing at 0 minutes Pulse- Standing at 0 minutes  01/02/23 1220 142/78 92 121/78 94    Updated Vital Signs Pulse 96   Temp 98.6 F (37 C) (Oral)   Resp 18   Ht 5\' 3"  (1.6 m)   Wt 155 lb (70.3 kg)   LMP 06/24/2022 (Approximate)   SpO2 99%   BMI 27.46 kg/m   Visual Acuity Right Eye Distance:   Left Eye Distance:   Bilateral Distance:    Right Eye Near:   Left Eye Near:    Bilateral Near:     Physical Exam   UC Treatments / Results  Labs (all labs ordered are listed, but only abnormal results are displayed) Labs Reviewed  POCT URINALYSIS DIP (MANUAL ENTRY) - Abnormal; Notable for the following components:      Result Value   Color, UA other (*)    Clarity, UA cloudy (*)    Bilirubin, UA small (*)    Ketones, POC UA moderate (40) (*)    Spec Grav, UA >=1.030 (*)    Protein Ur, POC =30 (*)    Leukocytes, UA Trace (*)    All other components within normal limits  POCT FASTING CBG KUC MANUAL ENTRY  CERVICOVAGINAL ANCILLARY ONLY    EKG   Radiology No results found.  Procedures Procedures (including critical care time)  Medications Ordered in UC Medications - No data to display  Initial Impression / Assessment and Plan / UC Course  I have reviewed the triage vital signs and the nursing notes.  Pertinent labs & imaging results that were available during my care of the patient were reviewed by me and considered in my medical decision making (see chart for details).  Clinical Course as of 01/02/23 1311  Sun Jan 02, 2023  1255 Color, UA(!): other [DB]    Clinical Course User Index [DB] Nelda Marseille, NP    Patient is not currently on any prescribed medications.   We will treat for possible bv and otitis media.  She did not take previous medications as ordered.  She is advised no to do cocaine.  Which she did this morning.  She denies any current chest pain.  Urine is sent for culture.  She is showing since of dehydration with proteins in urine.  She is at risk for pylonephritis.   Final Clinical Impressions(s) / UC Diagnoses   Final diagnoses:  None   Discharge Instructions   None    ED Prescriptions   None    PDMP not reviewed this encounter.

## 2023-01-02 NOTE — ED Triage Notes (Signed)
Attempted to contact patient from waiting area (not in waiting area). Patient not available at numbers.Marland KitchenMarland Kitchen

## 2023-01-02 NOTE — ED Triage Notes (Signed)
Attempted to contact patient from waiting area "she just walked out to grab her purse". Waited + in waiting are and patient did not return. Patient access tried calling (2 numbers), unable to contact.... Will retry. Yancey Flemings CMA

## 2023-01-03 ENCOUNTER — Telehealth (HOSPITAL_COMMUNITY): Payer: Self-pay

## 2023-01-03 LAB — CERVICOVAGINAL ANCILLARY ONLY
Bacterial Vaginitis (gardnerella): NEGATIVE
Candida Glabrata: NEGATIVE
Candida Vaginitis: POSITIVE — AB
Chlamydia: NEGATIVE
Comment: NEGATIVE
Comment: NEGATIVE
Comment: NEGATIVE
Comment: NEGATIVE
Comment: NEGATIVE
Comment: NORMAL
Neisseria Gonorrhea: POSITIVE — AB
Trichomonas: NEGATIVE

## 2023-01-03 LAB — URINE CULTURE
Culture: NO GROWTH
Special Requests: NORMAL

## 2023-01-03 MED ORDER — CLOTRIMAZOLE 3 2 % VA CREA: 1.0000 | TOPICAL_CREAM | Freq: Every day | VAGINAL | 0 refills | Status: AC

## 2023-01-03 NOTE — Telephone Encounter (Signed)
Per protocol, pt will need to return to UC for Nurse Visit for Rocephin 500mg  IM.  Per protocol, pt requires tx with Diflucan.  Attempted to reach patient x1 - call could not be completed.  Rx sent to pharmacy on file.

## 2023-01-26 NOTE — L&D Delivery Note (Signed)
 OB/GYN Faculty Practice Delivery Note  Erica [name] is a 43 y.o. [id number] s/p VBAC at [redacted]w[redacted]d. She was admitted for preterm labor.   ROM: 0h 47m with clear fluid GBS Status: Negative/-- (01/27 0000) Maximum Maternal Temperature: 100.32F   Labor Progress: Initial SVE: 2.5/50. She then progressed to complete.   Delivery Date/Time: 03/03/23 2019 Delivery: Called to room and patient was complete and pushing. Head delivered LOA. No nuchal cord present. Shoulder and body delivered in usual fashion. Infant with spontaneous cry, placed on mother's abdomen, dried and stimulated. Cord clamped x 2 after 1-minute delay, and cut by grandmother of baby. Cord blood drawn. Placenta delivered spontaneously with gentle cord traction. Fundus firm with massage and Pitocin  and TXA. Labia, perineum, vagina, and cervix inspected inspected with a hemostatic periurethral laceration which was not repaired.  Baby Weight: pending  Placenta: Sent to pathology Complications: None Lacerations: Hemostatic periurethral laceration EBL: 118 mL Analgesia: Epidural   Infant:  APGAR (1 MIN): 7  APGAR (5 MINS): 9   Alain Sor, MD OB Fellow, Faculty Practice [facility], Center for [facility]

## 2023-02-21 ENCOUNTER — Inpatient Hospital Stay (HOSPITAL_COMMUNITY)
Admission: AD | Admit: 2023-02-21 | Discharge: 2023-02-21 | Discharge status: Against Medical Advice | Payer: Medicaid Other | Attending: Obstetrics and Gynecology | Admitting: Obstetrics and Gynecology

## 2023-02-21 ENCOUNTER — Encounter (HOSPITAL_COMMUNITY): Payer: Self-pay | Admitting: Obstetrics & Gynecology

## 2023-02-21 ENCOUNTER — Inpatient Hospital Stay (HOSPITAL_COMMUNITY): Payer: Medicaid Other

## 2023-02-21 DIAGNOSIS — O99323 Drug use complicating pregnancy, third trimester: Secondary | ICD-10-CM

## 2023-02-21 DIAGNOSIS — O0930 Supervision of pregnancy with insufficient antenatal care, unspecified trimester: Secondary | ICD-10-CM

## 2023-02-21 DIAGNOSIS — Z3A34 34 weeks gestation of pregnancy: Secondary | ICD-10-CM | POA: Diagnosis not present

## 2023-02-21 DIAGNOSIS — O09523 Supervision of elderly multigravida, third trimester: Secondary | ICD-10-CM | POA: Diagnosis not present

## 2023-02-21 DIAGNOSIS — O26893 Other specified pregnancy related conditions, third trimester: Secondary | ICD-10-CM | POA: Insufficient documentation

## 2023-02-21 DIAGNOSIS — O0933 Supervision of pregnancy with insufficient antenatal care, third trimester: Secondary | ICD-10-CM | POA: Diagnosis not present

## 2023-02-21 DIAGNOSIS — F1410 Cocaine abuse, uncomplicated: Secondary | ICD-10-CM | POA: Insufficient documentation

## 2023-02-21 DIAGNOSIS — F17210 Nicotine dependence, cigarettes, uncomplicated: Secondary | ICD-10-CM | POA: Insufficient documentation

## 2023-02-21 DIAGNOSIS — O98219 Gonorrhea complicating pregnancy, unspecified trimester: Secondary | ICD-10-CM | POA: Diagnosis present

## 2023-02-21 DIAGNOSIS — F1490 Cocaine use, unspecified, uncomplicated: Secondary | ICD-10-CM

## 2023-02-21 DIAGNOSIS — O4703 False labor before 37 completed weeks of gestation, third trimester: Secondary | ICD-10-CM | POA: Insufficient documentation

## 2023-02-21 DIAGNOSIS — O99320 Drug use complicating pregnancy, unspecified trimester: Secondary | ICD-10-CM

## 2023-02-21 DIAGNOSIS — O479 False labor, unspecified: Secondary | ICD-10-CM

## 2023-02-21 DIAGNOSIS — O99333 Smoking (tobacco) complicating pregnancy, third trimester: Secondary | ICD-10-CM | POA: Diagnosis not present

## 2023-02-21 DIAGNOSIS — Z641 Problems related to multiparity: Secondary | ICD-10-CM

## 2023-02-21 HISTORY — DX: Trichomonal vulvovaginitis: A59.01

## 2023-02-21 HISTORY — DX: Other psychoactive substance dependence, uncomplicated: F19.20

## 2023-02-21 LAB — URINALYSIS, ROUTINE W REFLEX MICROSCOPIC
Bilirubin Urine: NEGATIVE
Glucose, UA: NEGATIVE mg/dL
Hgb urine dipstick: NEGATIVE
Ketones, ur: 20 mg/dL — AB
Nitrite: NEGATIVE
Protein, ur: 100 mg/dL — AB
Specific Gravity, Urine: 1.028 (ref 1.005–1.030)
pH: 5 (ref 5.0–8.0)

## 2023-02-21 LAB — OB RESULTS CONSOLE GBS: GBS: NEGATIVE

## 2023-02-21 LAB — TYPE AND SCREEN
ABO/RH(D): O POS
Antibody Screen: NEGATIVE

## 2023-02-21 LAB — WET PREP, GENITAL
Clue Cells Wet Prep HPF POC: NONE SEEN
Sperm: NONE SEEN
Trich, Wet Prep: NONE SEEN
WBC, Wet Prep HPF POC: <10 (ref <10)
Yeast Wet Prep HPF POC: NONE SEEN

## 2023-02-21 LAB — CBC
HCT: 36.5 % (ref 36.0–46.0)
Hemoglobin: 11.9 g/dL — ABNORMAL LOW (ref 12.0–15.0)
MCH: 30.2 pg (ref 26.0–34.0)
MCHC: 32.6 g/dL (ref 30.0–36.0)
MCV: 92.6 fL (ref 80.0–100.0)
Platelets: 274 10*3/uL (ref 150–400)
RBC: 3.94 MIL/uL (ref 3.87–5.11)
RDW: 13.9 % (ref 11.5–15.5)
WBC: 9.3 10*3/uL (ref 4.0–10.5)
nRBC: 0 % (ref 0.0–0.2)

## 2023-02-21 LAB — RAPID URINE DRUG SCREEN, HOSP PERFORMED
Amphetamines: NOT DETECTED
Barbiturates: NOT DETECTED
Benzodiazepines: NOT DETECTED
Cocaine: POSITIVE — AB
Opiates: NOT DETECTED
Tetrahydrocannabinol: NOT DETECTED

## 2023-02-21 LAB — RPR: RPR Ser Ql: NONREACTIVE

## 2023-02-21 LAB — HIV ANTIBODY (ROUTINE TESTING W REFLEX): HIV Screen 4th Generation wRfx: NONREACTIVE

## 2023-02-21 LAB — HEPATITIS B SURFACE ANTIGEN: Hepatitis B Surface Ag: NONREACTIVE

## 2023-02-21 LAB — HEPATITIS C ANTIBODY: HCV Ab: NONREACTIVE

## 2023-02-21 LAB — TROPONIN I (HIGH SENSITIVITY): Troponin I (High Sensitivity): 6 ng/L (ref <18)

## 2023-02-21 MED ORDER — LACTATED RINGERS IV SOLN: INTRAVENOUS | Status: DC

## 2023-02-21 MED ORDER — CEFTRIAXONE SODIUM 500 MG IJ SOLR
500.0000 mg | Freq: Once | INTRAMUSCULAR | Status: AC
  Administered 2023-02-21: 500 mg via INTRAMUSCULAR
  Filled 2023-02-21: qty 500

## 2023-02-21 MED ORDER — LIDOCAINE HCL (PF) 1 % IJ SOLN
1.0000 mL | Freq: Once | INTRAMUSCULAR | Status: AC
  Administered 2023-02-21: 1 mL
  Filled 2023-02-21: qty 5

## 2023-02-21 NOTE — Progress Notes (Signed)
Requested from [facility] to review EKG in pt with chest pain and hx of MI in the past. Pt with CP in s/o recent cocaine use.    EKG: NSR with ventricular rate in the 80s, normal axis, incomplete RBBB and V1/V2 inverted Twaves that were seen on prior ECGs, slight ST elevation that appears to be most likely J point elevation in V3, however given the contiguous leads recommended obtaining troponin   I would consider vasospasm given cocaine usage as well.  As always if there is any further concern clinically (I myself have not evaluated the patient) for ACS I would have low threshold to consult cardiology.   Mittie Bodo, MD Family Medicine - Obstetrics Fellow

## 2023-02-21 NOTE — MAU Provider Note (Addendum)
History     [name]: [id number]  Arrival date and time: 02/21/23 0454   Event Date/Time   First Provider Initiated Contact with Patient 02/21/23 [id number]      Chief Complaint  Patient presents with   Back Pain   Chest Pain   Addiction Problem    Cocaine    Contractions    Irregular    HPI Erica [name] is a 43 y.o. year old [id number] female at [redacted]w[redacted]d weeks gestation who presents to MAU reporting irregular contractions all day. She reports having a white vaginal discharge with an odor. She was seen at [facility] on 01/02/2023 dx'd with [facility] and UTI. She reports she received abx for UTI, but did not take them consistently. She never did return for the shot for (+) GC. She recently learned of a sexual partner that has trichomonas, but did not tell her. She had sex 2 nights ago with a condom, but it came off and they were unable to find it. She has not received any PNC during this pregnancy due to SUD; drug of choice is crack cocaine. She last smoked crack cocaine at 0500 this morning. She plans to go to [facility] drug treatment program for pregnant women, but "needs medical clearance first."  OB History     Gravida  13   Para  8   Term  5   Preterm  3   AB  3   Living  8      SAB  2   IAB  0   Ectopic  1   Multiple  0   Live Births  8           Past Medical History:  Diagnosis Date   Abnormal Pap smear    Anemia    Anxiety    Depression    as teen   Drug addiction (HCC)    smokes cocaine per pt   GERD (gastroesophageal reflux disease)    Gonorrhea 01/02/2023   Headache(784.0)    Pneumonia    Postpartum depression    Pregnancy induced hypertension    Trichomonal vaginitis     Past Surgical History:  Procedure Laterality Date   APPENDECTOMY     CESAREAN SECTION  2008   COLPOSCOPY     DILATION AND CURETTAGE OF UTERUS  2010   LAPAROSCOPIC APPENDECTOMY N/A 07/11/2016   Procedure: APPENDECTOMY LAPAROSCOPIC;  Surgeon: Ricarda Frame, MD;   Location: [facility] ORS;  Service: General;  Laterality: N/A;   WISDOM TOOTH EXTRACTION      Family History  Problem Relation Age of Onset   Hypertension Mother    Seizures Mother    Seizures Brother    Seizures Brother    Hypertension Maternal Grandmother    Arthritis Maternal Grandmother    Cancer Maternal Grandmother        breast   Stroke Maternal Grandfather    Birth defects Daughter        hole in heart   Seizures Maternal Aunt    Seizures Cousin    Seizures Cousin    Mental retardation Cousin     Social History   Tobacco Use   Smoking status: Every Day    Current packs/day: 1.00    Average packs/day: 1 pack/day for 13.0 years (13.0 ttl pk-yrs)    Types: Cigarettes   Smokeless tobacco: Never  Vaping Use   Vaping status: Never Used  Substance Use Topics   Alcohol use: Not Currently  Comment: occ   Drug use: Yes    Types: Cocaine    Comment: Last used: Today.    0500    Allergies:  Allergies  Allergen Reactions   Levaquin [Levofloxacin] Hives, Swelling and Other (See Comments)    Lip swelling Chest tightness    Medications Prior to Admission  Medication Sig Dispense Refill Last Dose/Taking   amoxicillin-clavulanate (AUGMENTIN) 875-125 MG tablet Take 1 tablet by mouth every 12 (twelve) hours. 14 tablet 0    fluticasone (FLONASE) 50 MCG/ACT nasal spray Place 1 spray into both nostrils daily. 16 g 0    ibuprofen (ADVIL) 600 MG tablet Take 1 tablet (600 mg total) by mouth every 6 (six) hours. (Patient not taking: Reported on 03/23/2022) 30 tablet 0    loratadine (CLARITIN) 10 MG tablet Take 1 tablet (10 mg total) by mouth daily for 14 days. 14 tablet 0    metroNIDAZOLE (FLAGYL) 500 MG tablet Take 1 tablet (500 mg total) by mouth 2 (two) times daily. 14 tablet 0    metroNIDAZOLE (METROGEL) 0.75 % vaginal gel Place 1 Applicatorful vaginally at bedtime.      Prenatal Vit-Fe Fumarate-FA (M-NATAL PLUS PO) Take 1 tablet by mouth daily. (Patient not taking: Reported on  03/23/2022)       Review of Systems  Constitutional: Negative.   HENT: Negative.    Eyes: Negative.   Respiratory:  Positive for cough.   Cardiovascular:  Positive for chest pain.  Gastrointestinal: Negative.   Endocrine: Negative.   Genitourinary:  Positive for pelvic pain (frequent, painful contractions "every few mins").  Musculoskeletal: Negative.   Skin: Negative.   Allergic/Immunologic: Negative.   Neurological: Negative.   Hematological: Negative.   Psychiatric/Behavioral:  The patient is nervous/anxious.    Physical Exam   Blood pressure 126/80, pulse 90, temperature 98 F (36.7 C), temperature source Oral, resp. rate 17, height 5' 3.5" (1.613 m), weight 70.3 kg, last menstrual period 06/24/2022, SpO2 98%, not currently breastfeeding.  Physical Exam Vitals and nursing note reviewed. Exam conducted with a chaperone present.  Constitutional:      Appearance: Normal appearance. She is normal weight.  Cardiovascular:     Rate and Rhythm: Normal rate.  Pulmonary:     Effort: Pulmonary effort is normal.  Genitourinary:    General: Normal vulva.     Comments: Pelvic exam: External genitalia normal, SE: vaginal walls pink and well rugated, cervix is smooth, pink, no lesions, small amt of thick, white vaginal d/c -- WP, GC/CT done, cervix not well-visualized, no condom seen on speculum exam*; Uterus is non-tender, S=D, no CMT or friability, no adnexal tenderness.  Dilation: 2 Effacement (%): 50 Cervical Position: Posterior Station: -3, Ballotable Presentation: Vertex Exam by: Raelyn Mora CNM  *Condom palpated with cervical check; removed and discarded. Neurological:     Mental Status: She is alert and oriented to person, place, and time.  Psychiatric:        Attention and Perception: Attention and perception normal.        Mood and Affect: Mood is anxious and depressed. Affect is tearful (stating, "I'm so stupid for starting on drugs. I was such a good momma before  this. I don't know what I was thinking to ever start this.").        Speech: Speech normal.        Behavior: Behavior is agitated.        Thought Content: Thought content normal.        Cognition and  Memory: Cognition normal.        Judgment: Judgment normal.    REACTIVE NST - FHR: 120 bpm / moderate variability / accels present / decels absent / TOCO: regular every 3-5 mins - firm to palpation  MAU Course  Procedures  MDM CCUA UCx- Results pending  Prenatal Labs GBS  OB MFM 14+ Detailed U/S UDS Rocephin 500 mg IM EKG*  Troponin I   *Consult with Dr. Judd Lien @ [phone] - asked to read EKG  **Consult with Dr. Macon Large @ [phone] - notified of patient's complaints, assessments, lab & NST results, recommended tx plan add UDS on with other labs ordered - will sign-out patient to Dr. Adrian Blackwater @ 0800   Results for orders placed or performed during the hospital encounter of 02/21/23 (from the past 24 hours)  Urinalysis, Routine w reflex microscopic -Urine, Clean Catch     Status: Abnormal   Collection Time: 02/21/23  5:42 AM  Result Value Ref Range   Color, Urine AMBER (A) YELLOW   APPearance HAZY (A) CLEAR   Specific Gravity, Urine 1.028 1.005 - 1.030   pH 5.0 5.0 - 8.0   Glucose, UA NEGATIVE NEGATIVE mg/dL   Hgb urine dipstick NEGATIVE NEGATIVE   Bilirubin Urine NEGATIVE NEGATIVE   Ketones, ur 20 (A) NEGATIVE mg/dL   Protein, ur 540 (A) NEGATIVE mg/dL   Nitrite NEGATIVE NEGATIVE   Leukocytes,Ua TRACE (A) NEGATIVE   RBC / HPF 0-5 0 - 5 RBC/hpf   WBC, UA 6-10 0 - 5 WBC/hpf   Bacteria, UA RARE (A) NONE SEEN   Squamous Epithelial / HPF 11-20 0 - 5 /HPF   Mucus PRESENT   Rapid urine drug screen (hospital performed)     Status: Abnormal   Collection Time: 02/21/23  5:42 AM  Result Value Ref Range   Opiates NONE DETECTED NONE DETECTED   Cocaine POSITIVE (A) NONE DETECTED   Benzodiazepines NONE DETECTED NONE DETECTED   Amphetamines NONE DETECTED NONE DETECTED   Tetrahydrocannabinol NONE  DETECTED NONE DETECTED   Barbiturates NONE DETECTED NONE DETECTED  Type and screen     Status: None   Collection Time: 02/21/23  6:21 AM  Result Value Ref Range   ABO/RH(D) O POS    Antibody Screen NEG    Sample Expiration      02/24/2023,2359 Performed at [facility], [address] [place], [place] [address]   CBC     Status: Abnormal   Collection Time: 02/21/23  6:23 AM  Result Value Ref Range   WBC 9.3 4.0 - 10.5 K/uL   RBC 3.94 3.87 - 5.11 MIL/uL   Hemoglobin 11.9 (L) 12.0 - 15.0 g/dL   HCT 14.7 82.9 - 56.2 %   MCV 92.6 80.0 - 100.0 fL   MCH 30.2 26.0 - 34.0 pg   MCHC 32.6 30.0 - 36.0 g/dL   RDW 13.0 86.5 - 78.4 %   Platelets 274 150 - 400 K/uL   nRBC 0.0 0.0 - 0.2 %  Hepatitis B surface antigen     Status: None   Collection Time: 02/21/23  6:23 AM  Result Value Ref Range   Hepatitis B Surface Ag NON REACTIVE NON REACTIVE  Troponin I (High Sensitivity)     Status: None   Collection Time: 02/21/23  6:28 AM  Result Value Ref Range   Troponin I (High Sensitivity) 6 <18 ng/L  Wet prep, genital     Status: None   Collection Time: 02/21/23  6:37  AM   Specimen: Cervix  Result Value Ref Range   Yeast Wet Prep HPF POC NONE SEEN NONE SEEN   Trich, Wet Prep NONE SEEN NONE SEEN   Clue Cells Wet Prep HPF POC NONE SEEN NONE SEEN   WBC, Wet Prep HPF POC <10 <10   Sperm NONE SEEN           Report given to and care assumed by Dr. Leanora Cover @ 0754  Raelyn Mora, CNM 02/21/2023, 6:05 AM   Assessment and Plan  [redacted] weeks gestation with uncertain dating, [place] today [redacted]w[redacted]d, 78% 6lb 1oz, AFI 17.8, cephalic, anterior placenta, Hx of CS then 3x VBAC, No PNC. - minimal cervical change on recheck, recommend continued IVF and recheck in 2 hours; pt declined to stay another 2 hours, as feeling much better and baby looks ok. Reports contractions are less regular than on admission, but more intense. Discouraged leaving.  - OB panel pending  Cough, nonproductive. Afebrile. No  leukocytosis.  - respiratory swab, refused.   UDS Cocaine positive, as expected.   Chest pain, resolved. Troponin 6. Suspect vasospasm following cocaine use.  - EKG: NSR with ventricular rate in the 80s, normal axis, incomplete RBBB and V1/V2 inverted Twaves that were seen on prior ECGs, slight ST elevation that appears to be most likely J point elevation in V3.  Wet prep negative.   Urine negative for UTI. 20 Ketones consistent with poor PO intake. 100 protein likely from leukocytes and rare bacteria. Culture pending.   Discussed pt plan, results, EFM with Dr. Berton Lan.  Patient refused observation or repeat cervical check, stating "I've had enough babies to know when I am in labor, and I live close enough to get here". Provided frank discussion of risks of preterm delivery out of hospital, increased risk of abruption with cocaine use, increased risk of uterine rupture given hx of C-section to include maternal and fetal death. Patient refused further monitoring, treatment or evaluation and opts for immediate discharge.  Patient left AMA.   Wyn Forster, MD FMOB Fellow, Faculty practice [facility], Center for [facility]

## 2023-02-21 NOTE — Discharge Instructions (Signed)
Safe Medications in Pregnancy    Acne: Benzoyl Peroxide Salicylic Acid  Backache/Headache: Tylenol: 2 regular strength every 4 hours OR              2 Extra strength every 6 hours  Colds/Coughs/Allergies: Benadryl (alcohol free) 25 mg every 6 hours as needed Breath right strips Claritin Cepacol throat lozenges Chloraseptic throat spray Cold-Eeze- up to three times per day Cough drops, alcohol free Flonase (by prescription only) Guaifenesin Mucinex Robitussin DM (plain only, alcohol free) Saline nasal spray/drops Sudafed (pseudoephedrine) & Actifed ** use only after [redacted] weeks gestation and if you do not have high blood pressure Tylenol Vicks Vaporub Zinc lozenges Zyrtec   Constipation: Colace Ducolax suppositories Fleet enema Glycerin suppositories Metamucil Milk of magnesia Miralax Senokot Smooth move tea  Diarrhea: Kaopectate Imodium A-D  *NO pepto Bismol  Hemorrhoids: Anusol Anusol HC Preparation H Tucks  Indigestion: Tums Maalox Mylanta Zantac  Pepcid  Insomnia: Benadryl (alcohol free) 25mg  every 6 hours as needed Tylenol PM Unisom, no Gelcaps  Leg Cramps: Tums MagGel  Nausea/Vomiting:  Bonine Dramamine Emetrol Ginger extract Sea bands Meclizine  Nausea medication to take during pregnancy:  Unisom (doxylamine succinate 25 mg tablets) Take one tablet daily at bedtime. If symptoms are not adequately controlled, the dose can be increased to a maximum recommended dose of two tablets daily (1/2 tablet in the morning, 1/2 tablet mid-afternoon and one at bedtime). Vitamin B6 100mg  tablets. Take one tablet twice a day (up to 200 mg per day).  Skin Rashes: Aveeno products Benadryl cream or 25mg  every 6 hours as needed Calamine Lotion 1% cortisone cream  Yeast infection: Gyne-lotrimin 7 Monistat 7   **If taking multiple medications, please check labels to avoid duplicating the same active ingredients **take  medication as directed on the label ** Do not exceed 4000 mg of tylenol in 24 hours **Do not take medications that contain aspirin or ibuprofen    [employer] [facility] [employer] at [employer] [facility]             [address], [place], [place] [address] [phone]  Center for [employer] at [facility]                                                             [address], [place], [place], [address] [phone]  Center for [facility] at [facility] [address], [place], [place], [address] [phone]  Center for [facility] at [facility] [address], [address], [place], [place], [address] [phone]  Center for [facility] at [facility]                                 [address] [place], [place], [place], [address] [phone]  Center for [facility] at [facility]  [address], [place], [place], [address] [phone]  Center for [facility] at [facility] [address], [address], [place], [place], [address]                              [facility] [place] [address], [place], [place], [address] [phone]  [facility]         Phone: [phone]  [facility]      Phone: [phone]   [facility]      Phone: [phone]  [facility]         Phone: [phone]   [facility]    Phone: [phone]  [facility]      Phone: [phone]  [facility]     Phone: [phone]  [facility]        Phone: [phone]  [facility] ([address] [place]) [phone]  [facility]      Phone: [phone]

## 2023-02-21 NOTE — MAU Note (Signed)
.  Erica [name] is a 43 y.o. 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  Pt states that this is her 12th pregnancy and that she has had 9 deliveries 1 ectopic pregnancy and 2 miscarriages. She has not received any prenatal care due to her substance abuse per her report.  "I need medical clearance so that I can be admitted to a treatment program at [facility]."   Pain score: 8 back                     6 lower abdomen  Vitals:   02/21/23 0545  BP: 126/80  Pulse: 91  Resp: 17  Temp: 98 F (36.7 C)  SpO2: 98%     FHT: 125bpm  Lab orders placed from triage: UA  Used cocaine last at 0500-smokes

## 2023-02-21 NOTE — MAU Note (Signed)
FHR variability  noted to be minimal with baseline at 115 following SVE baseline returned to 125 with moderate variability with 15 x 15 accelerations

## 2023-02-21 NOTE — MAU Note (Signed)
Patient had removed self from monitors and requesting to leave AMA because she feels better. Risks and return precautions reviewed with patient. AMA form signed. Dr. Leanora Cover notified.

## 2023-02-22 LAB — GC/CHLAMYDIA PROBE AMP (~~LOC~~) NOT AT ARMC
Chlamydia: NEGATIVE
Comment: NEGATIVE
Comment: NORMAL
Neisseria Gonorrhea: NEGATIVE

## 2023-02-22 LAB — CULTURE, OB URINE: Culture: <10000 — AB

## 2023-02-22 LAB — RUBELLA SCREEN: Rubella: 1.43 {index} (ref >0.99)

## 2023-02-23 LAB — CULTURE, BETA STREP (GROUP B ONLY)

## 2023-03-01 ENCOUNTER — Inpatient Hospital Stay (EMERGENCY_DEPARTMENT_HOSPITAL)
Admission: AD | Admit: 2023-03-01 | Discharge: 2023-03-02 | Disposition: A | Discharge status: Home / Self Care | Payer: Medicaid Other | Source: Home / Self Care | Attending: Obstetrics and Gynecology | Admitting: Obstetrics and Gynecology

## 2023-03-01 ENCOUNTER — Encounter (HOSPITAL_COMMUNITY): Payer: Self-pay | Admitting: Obstetrics and Gynecology

## 2023-03-01 DIAGNOSIS — O98313 Other infections with a predominantly sexual mode of transmission complicating pregnancy, third trimester: Secondary | ICD-10-CM | POA: Insufficient documentation

## 2023-03-01 DIAGNOSIS — O0933 Supervision of pregnancy with insufficient antenatal care, third trimester: Secondary | ICD-10-CM | POA: Insufficient documentation

## 2023-03-01 DIAGNOSIS — O26893 Other specified pregnancy related conditions, third trimester: Secondary | ICD-10-CM | POA: Insufficient documentation

## 2023-03-01 DIAGNOSIS — O23593 Infection of other part of genital tract in pregnancy, third trimester: Secondary | ICD-10-CM | POA: Insufficient documentation

## 2023-03-01 DIAGNOSIS — O479 False labor, unspecified: Secondary | ICD-10-CM

## 2023-03-01 DIAGNOSIS — O133 Gestational [pregnancy-induced] hypertension without significant proteinuria, third trimester: Secondary | ICD-10-CM | POA: Insufficient documentation

## 2023-03-01 DIAGNOSIS — O09523 Supervision of elderly multigravida, third trimester: Secondary | ICD-10-CM | POA: Insufficient documentation

## 2023-03-01 DIAGNOSIS — O99323 Drug use complicating pregnancy, third trimester: Secondary | ICD-10-CM | POA: Insufficient documentation

## 2023-03-01 DIAGNOSIS — A5901 Trichomonal vulvovaginitis: Secondary | ICD-10-CM | POA: Insufficient documentation

## 2023-03-01 DIAGNOSIS — R058 Other specified cough: Secondary | ICD-10-CM | POA: Insufficient documentation

## 2023-03-01 DIAGNOSIS — O4703 False labor before 37 completed weeks of gestation, third trimester: Secondary | ICD-10-CM | POA: Insufficient documentation

## 2023-03-01 DIAGNOSIS — Z3A35 35 weeks gestation of pregnancy: Secondary | ICD-10-CM | POA: Insufficient documentation

## 2023-03-01 DIAGNOSIS — F1490 Cocaine use, unspecified, uncomplicated: Secondary | ICD-10-CM | POA: Insufficient documentation

## 2023-03-01 DIAGNOSIS — F1410 Cocaine abuse, uncomplicated: Secondary | ICD-10-CM

## 2023-03-01 DIAGNOSIS — A599 Trichomoniasis, unspecified: Secondary | ICD-10-CM

## 2023-03-01 NOTE — MAU Note (Signed)
.  Erica [name] is a 43 y.o. at [redacted]w[redacted]d here in MAU reporting:leaking fld since yesterday. Fld is yellow. Reports good FM. Ctxs all day but more regular tonight. Has had some congestion for 2 wks but more so last 4days. Was 3cm last time she was checked in MAU   Onset of complaint: yesterday Pain score: 6 Vitals:   03/01/23 2326  Pulse: (!) 113  Resp: 18  Temp: 97.7 F (36.5 C)  SpO2: 98%     FHT: 145  Lab orders placed from triage: u/a

## 2023-03-02 ENCOUNTER — Inpatient Hospital Stay (HOSPITAL_COMMUNITY): Payer: Medicaid Other

## 2023-03-02 DIAGNOSIS — A599 Trichomoniasis, unspecified: Secondary | ICD-10-CM

## 2023-03-02 DIAGNOSIS — O133 Gestational [pregnancy-induced] hypertension without significant proteinuria, third trimester: Secondary | ICD-10-CM

## 2023-03-02 DIAGNOSIS — Z3A35 35 weeks gestation of pregnancy: Secondary | ICD-10-CM

## 2023-03-02 DIAGNOSIS — O99323 Drug use complicating pregnancy, third trimester: Secondary | ICD-10-CM

## 2023-03-02 DIAGNOSIS — F1410 Cocaine abuse, uncomplicated: Secondary | ICD-10-CM

## 2023-03-02 DIAGNOSIS — O4703 False labor before 37 completed weeks of gestation, third trimester: Secondary | ICD-10-CM

## 2023-03-02 DIAGNOSIS — R058 Other specified cough: Secondary | ICD-10-CM

## 2023-03-02 LAB — URINALYSIS, ROUTINE W REFLEX MICROSCOPIC
Bilirubin Urine: NEGATIVE
Glucose, UA: NEGATIVE mg/dL
Ketones, ur: 5 mg/dL — AB
Nitrite: NEGATIVE
Protein, ur: 100 mg/dL — AB
RBC / HPF: >50 RBC/hpf (ref 0–5)
Specific Gravity, Urine: 1.025 (ref 1.005–1.030)
WBC, UA: >50 WBC/hpf (ref 0–5)
pH: 6 (ref 5.0–8.0)

## 2023-03-02 LAB — CBC
HCT: 32.5 % — ABNORMAL LOW (ref 36.0–46.0)
Hemoglobin: 10.6 g/dL — ABNORMAL LOW (ref 12.0–15.0)
MCH: 30.4 pg (ref 26.0–34.0)
MCHC: 32.6 g/dL (ref 30.0–36.0)
MCV: 93.1 fL (ref 80.0–100.0)
Platelets: 252 10*3/uL (ref 150–400)
RBC: 3.49 MIL/uL — ABNORMAL LOW (ref 3.87–5.11)
RDW: 14 % (ref 11.5–15.5)
WBC: 13.2 10*3/uL — ABNORMAL HIGH (ref 4.0–10.5)
nRBC: 0 % (ref 0.0–0.2)

## 2023-03-02 LAB — COMPREHENSIVE METABOLIC PANEL
ALT: 16 U/L (ref 0–44)
AST: 17 U/L (ref 15–41)
Albumin: 2.3 g/dL — ABNORMAL LOW (ref 3.5–5.0)
Alkaline Phosphatase: 150 U/L — ABNORMAL HIGH (ref 38–126)
Anion gap: 8 (ref 5–15)
BUN: <5 mg/dL — ABNORMAL LOW (ref 6–20)
CO2: 22 mmol/L (ref 22–32)
Calcium: 8.6 mg/dL — ABNORMAL LOW (ref 8.9–10.3)
Chloride: 105 mmol/L (ref 98–111)
Creatinine, Ser: 0.69 mg/dL (ref 0.44–1.00)
GFR, Estimated: >60 mL/min (ref >60)
Glucose, Bld: 86 mg/dL (ref 70–99)
Potassium: 3.7 mmol/L (ref 3.5–5.1)
Sodium: 135 mmol/L (ref 135–145)
Total Bilirubin: 0.2 mg/dL (ref 0.0–1.2)
Total Protein: 5.2 g/dL — ABNORMAL LOW (ref 6.5–8.1)

## 2023-03-02 LAB — WET PREP, GENITAL
Clue Cells Wet Prep HPF POC: NONE SEEN
Sperm: NONE SEEN
WBC, Wet Prep HPF POC: >=10 — AB (ref <10)
Yeast Wet Prep HPF POC: NONE SEEN

## 2023-03-02 LAB — RESP PANEL BY RT-PCR (RSV, FLU A&B, COVID)  RVPGX2
Influenza A by PCR: NEGATIVE
Influenza B by PCR: NEGATIVE
Resp Syncytial Virus by PCR: NEGATIVE
SARS Coronavirus 2 by RT PCR: NEGATIVE

## 2023-03-02 LAB — POCT FERN TEST: POCT Fern Test: NEGATIVE

## 2023-03-02 LAB — RUPTURE OF MEMBRANE (ROM)PLUS: Rom Plus: NEGATIVE

## 2023-03-02 LAB — PROTEIN / CREATININE RATIO, URINE
Creatinine, Urine: 173 mg/dL
Protein Creatinine Ratio: 0.29 mg/mg{creat} — ABNORMAL HIGH (ref 0.00–0.15)
Total Protein, Urine: 51 mg/dL

## 2023-03-02 MED ORDER — GUAIFENESIN ER 600 MG PO TB12: 600.0000 mg | ORAL_TABLET | Freq: Two times a day (BID) | ORAL | 0 refills | Status: DC

## 2023-03-02 MED ORDER — ALUM & MAG HYDROXIDE-SIMETH 200-200-20 MG/5ML PO SUSP
30.0000 mL | Freq: Once | ORAL | Status: AC
  Administered 2023-03-02: 30 mL via ORAL
  Filled 2023-03-02: qty 30

## 2023-03-02 MED ORDER — METRONIDAZOLE 500 MG PO TABS: 500.0000 mg | ORAL_TABLET | Freq: Two times a day (BID) | ORAL | 0 refills | Status: DC

## 2023-03-02 NOTE — MAU Note (Signed)
 Pt requesting something for heartburn - CNM made aware; new orders placed.

## 2023-03-02 NOTE — MAU Provider Note (Signed)
 Chief Complaint:  Contractions and Vaginal Discharge   Event Date/Time   First Provider Initiated Contact with Patient 03/02/23 0003     HPI: Erica [name] is a 43 y.o. [id number] at 31w6dwho presents to maternity admissions reporting yellow vaginal fluid, contractions since yesterday and persistent productive cough for the past 2 weeks.  Admits to using cocaine tonight.  Reports having had preeclampsia in past but thinks elevated BP may be due to her cocaine use.    No prenatal care this pregnancy. She reports good fetal movement, denies vaginal bleeding, urinary symptoms, h/a, n/v, diarrhea, constipation or fever/chills.    Vaginal Discharge The patient's primary symptoms include genital itching, pelvic pain and vaginal discharge. The patient's pertinent negatives include no vaginal bleeding. The current episode started in the past 7 days. The problem has been unchanged. The pain is mild. She is pregnant. Associated symptoms include abdominal pain. Pertinent negatives include no back pain, dysuria, fever or frequency. The vaginal discharge was yellow. There has been no bleeding. She has not been passing clots. She has not been passing tissue. Nothing aggravates the symptoms. She has tried nothing for the symptoms.  Abdominal Pain This is a recurrent problem. The current episode started in the past 7 days. The quality of the pain is cramping. The abdominal pain does not radiate. Pertinent negatives include no dysuria, fever or frequency. The pain is relieved by Nothing. She has tried nothing for the symptoms.   RN note: Erica [name] is a 43 y.o. at [redacted]w[redacted]d here in MAU reporting:leaking fld since yesterday. Fld is yellow. Reports good FM. Ctxs all day but more regular tonight. Has had some congestion for 2 wks but more so last 4days. Was 3cm last time she was checked in MAU  Onset of complaint: yesterday Pain score: 6  Past Medical History: Past Medical History:  Diagnosis Date   Abnormal Pap smear     Anemia    Anxiety    Depression    as teen   Drug addiction (HCC)    smokes cocaine per pt   GERD (gastroesophageal reflux disease)    Gonorrhea 01/02/2023   Headache(784.0)    Pneumonia    Postpartum depression    Pregnancy induced hypertension    Trichomonal vaginitis     Past obstetric history: OB History  Gravida Para Term Preterm AB Living  13 8 5 3 3 8   SAB IAB Ectopic Multiple Live Births  2 0 1 0 8    # Outcome Date GA Lbr Len/2nd Weight Sex Type Anes PTL Lv  13 Current           12 Preterm 11/19/21 [redacted]w[redacted]d 01:30 / 00:06 2948 g F VBAC None  LIV  11 Term 08/18/19 [redacted]w[redacted]d 08:42 / 00:16 2380 g F VBAC EPI  LIV  10 Ectopic 2020          9 SAB 2019          8 Term 05/19/11 [redacted]w[redacted]d 04:32 / 00:05 2835 g M Vag-Spont EPI  LIV     Birth Comments: No anomalies noted  7 SAB 2011 [redacted]w[redacted]d         6 Preterm 2008 [redacted]w[redacted]d  1871 g F CS-LTranv   LIV     Birth Comments: PIH IUGR  5 Preterm 2006 [redacted]w[redacted]d 06:00  F Vag-Spont EPI  LIV     Birth Comments: fetal blocked kidney, doesn't talk mentally handicapped, BUFA  4 Term 2005 [redacted]w[redacted]d 05:00 4026 g M Vag-Spont  EPI  LIV     Birth Comments: BUFA  3 Term 2002 [redacted]w[redacted]d 06:00 3374 g F Vag-Spont EPI  LIV     Birth Comments: BUFA  2 Term 2000 [redacted]w[redacted]d 13:00 3969 g F Vag-Spont EPI  LIV     Birth Comments: adopted had C diff and chicken pox during pregnancy BUFA  1 Gravida             Past Surgical History: Past Surgical History:  Procedure Laterality Date   APPENDECTOMY     CESAREAN SECTION  2008   COLPOSCOPY     DILATION AND CURETTAGE OF UTERUS  2010   LAPAROSCOPIC APPENDECTOMY N/A 07/11/2016   Procedure: APPENDECTOMY LAPAROSCOPIC;  Surgeon: Shelva Dunnings, MD;  Location: [facility] ORS;  Service: General;  Laterality: N/A;   WISDOM TOOTH EXTRACTION      Family History: Family History  Problem Relation Age of Onset   Hypertension Mother    Seizures Mother    Seizures Brother    Seizures Brother    Hypertension Maternal Grandmother    Arthritis  Maternal Grandmother    Cancer Maternal Grandmother        breast   Stroke Maternal Grandfather    Birth defects Daughter        hole in heart   Seizures Maternal Aunt    Seizures Cousin    Seizures Cousin    Mental retardation Cousin     Social History: Social History   Tobacco Use   Smoking status: Every Day    Current packs/day: 1.00    Average packs/day: 1 pack/day for 13.0 years (13.0 ttl pk-yrs)    Types: Cigarettes   Smokeless tobacco: Never  Vaping Use   Vaping status: Never Used  Substance Use Topics   Alcohol use: Not Currently    Comment: occ   Drug use: Yes    Types: Cocaine    Comment: last used today 2/4    Allergies:  Allergies  Allergen Reactions   Levaquin [Levofloxacin] Hives, Swelling and Other (See Comments)    Lip swelling Chest tightness    Meds:  Medications Prior to Admission  Medication Sig Dispense Refill Last Dose/Taking   amoxicillin -clavulanate (AUGMENTIN ) 875-125 MG tablet Take 1 tablet by mouth every 12 (twelve) hours. 14 tablet 0    fluticasone  (FLONASE ) 50 MCG/ACT nasal spray Place 1 spray into both nostrils daily. 16 g 0    loratadine  (CLARITIN ) 10 MG tablet Take 1 tablet (10 mg total) by mouth daily for 14 days. 14 tablet 0    Prenatal Vit-Fe Fumarate-FA (M-NATAL PLUS PO) Take 1 tablet by mouth daily. (Patient not taking: Reported on 03/23/2022)       I have reviewed patient's Past Medical Hx, Surgical Hx, Family Hx, Social Hx, medications and allergies.   ROS:  Review of Systems  Constitutional:  Negative for fever.  Gastrointestinal:  Positive for abdominal pain.  Genitourinary:  Positive for pelvic pain and vaginal discharge. Negative for dysuria and frequency.  Musculoskeletal:  Negative for back pain.   Other systems negative  Physical Exam  Patient Vitals for the past 24 hrs:  BP Temp Pulse Resp SpO2 Height Weight  03/02/23 0000 (!) 139/93 -- 96 -- 97 % -- --  03/01/23 2345 (!) 143/95 -- (!) 110 -- -- -- --   03/01/23 2329 (!) 141/90 -- -- -- -- -- --  03/01/23 2326 -- 97.7 F (36.5 C) (!) 113 18 98 % 5' 3.5 (1.613 m) 64.4 kg  Vitals:   03/01/23 2329 03/01/23 2345 03/02/23 0000 03/02/23 0016  BP: (!) 141/90 (!) 143/95 (!) 139/93 (!) 129/110   03/02/23 0046 03/02/23 0101 03/02/23 0116 03/02/23 0131  BP: 134/88 (!) 144/90 (!) 151/91 (!) 146/85   03/02/23 0146  BP: 98/86    Constitutional: Well-developed, well-nourished female in no acute distress. Very fidgety and restless at times   Oriented x 3 Cardiovascular: normal rate and rhythm Respiratory: normal effort, clear to auscultation bilaterally   +productive cough GI: Abd soft, non-tender, gravid appropriate for gestational age.   No rebound or guarding. MS: Extremities nontender, no edema, normal ROM Neurologic: Alert and oriented x 4.  GU: Neg CVAT.  PELVIC EXAM: Cervix pink, visually closed, without lesion, scant white creamy discharge, vaginal walls and external genitalia normal Dilation: 2.5 Effacement (%): 50 Presentation: Vertex Exam by:: Earnie Pouch, CNM    FHT:  Baseline 130 , moderate variability, accelerations present, no decelerations Contractions:  Irregular     Labs: Results for orders placed or performed during the hospital encounter of 03/01/23 (from the past 24 hours)  Rupture of Membrane (ROM) Plus     Status: None   Collection Time: 03/01/23 11:48 PM  Result Value Ref Range   Rom Plus NEGATIVE   Wet prep, genital     Status: Abnormal   Collection Time: 03/01/23 11:48 PM  Result Value Ref Range   Yeast Wet Prep HPF POC NONE SEEN NONE SEEN   Trich, Wet Prep PRESENT (A) NONE SEEN   Clue Cells Wet Prep HPF POC NONE SEEN NONE SEEN   WBC, Wet Prep HPF POC >=10 (A) <10   Sperm NONE SEEN   POCT fern test     Status: None   Collection Time: 03/02/23 12:03 AM  Result Value Ref Range   POCT Fern Test Negative = intact amniotic membranes   Urinalysis, Routine w reflex microscopic -Urine, Clean Catch      Status: Abnormal   Collection Time: 03/02/23 12:14 AM  Result Value Ref Range   Color, Urine AMBER (A) YELLOW   APPearance CLOUDY (A) CLEAR   Specific Gravity, Urine 1.025 1.005 - 1.030   pH 6.0 5.0 - 8.0   Glucose, UA NEGATIVE NEGATIVE mg/dL   Hgb urine dipstick SMALL (A) NEGATIVE   Bilirubin Urine NEGATIVE NEGATIVE   Ketones, ur 5 (A) NEGATIVE mg/dL   Protein, ur 899 (A) NEGATIVE mg/dL   Nitrite NEGATIVE NEGATIVE   Leukocytes,Ua LARGE (A) NEGATIVE   RBC / HPF >50 0 - 5 RBC/hpf   WBC, UA >50 0 - 5 WBC/hpf   Bacteria, UA FEW (A) NONE SEEN   Squamous Epithelial / HPF 6-10 0 - 5 /HPF   Mucus PRESENT    Hyaline Casts, UA PRESENT   Protein / creatinine ratio, urine     Status: Abnormal   Collection Time: 03/02/23 12:14 AM  Result Value Ref Range   Creatinine, Urine 173 mg/dL   Total Protein, Urine 51 mg/dL   Protein Creatinine Ratio 0.29 (H) 0.00 - 0.15 mg/mg[Cre]  Resp panel by RT-PCR (RSV, Flu A&B, Covid) Anterior Nasal Swab     Status: None   Collection Time: 03/02/23 12:16 AM   Specimen: Anterior Nasal Swab  Result Value Ref Range   SARS Coronavirus 2 by RT PCR NEGATIVE NEGATIVE   Influenza A by PCR NEGATIVE NEGATIVE   Influenza B by PCR NEGATIVE NEGATIVE   Resp Syncytial Virus by PCR NEGATIVE NEGATIVE  CBC  Status: Abnormal   Collection Time: 03/02/23  1:06 AM  Result Value Ref Range   WBC 13.2 (H) 4.0 - 10.5 K/uL   RBC 3.49 (L) 3.87 - 5.11 MIL/uL   Hemoglobin 10.6 (L) 12.0 - 15.0 g/dL   HCT 67.4 (L) 63.9 - 53.9 %   MCV 93.1 80.0 - 100.0 fL   MCH 30.4 26.0 - 34.0 pg   MCHC 32.6 30.0 - 36.0 g/dL   RDW 85.9 88.4 - 84.4 %   Platelets 252 150 - 400 K/uL   nRBC 0.0 0.0 - 0.2 %  Comprehensive metabolic panel     Status: Abnormal   Collection Time: 03/02/23  1:06 AM  Result Value Ref Range   Sodium 135 135 - 145 mmol/L   Potassium 3.7 3.5 - 5.1 mmol/L   Chloride 105 98 - 111 mmol/L   CO2 22 22 - 32 mmol/L   Glucose, Bld 86 70 - 99 mg/dL   BUN <5 (L) 6 - 20 mg/dL    Creatinine, Ser 9.30 0.44 - 1.00 mg/dL   Calcium  8.6 (L) 8.9 - 10.3 mg/dL   Total Protein 5.2 (L) 6.5 - 8.1 g/dL   Albumin 2.3 (L) 3.5 - 5.0 g/dL   AST 17 15 - 41 U/L   ALT 16 0 - 44 U/L   Alkaline Phosphatase 150 (H) 38 - 126 U/L   Total Bilirubin 0.2 0.0 - 1.2 mg/dL   GFR, Estimated >39 >39 mL/min   Anion gap 8 5 - 15    --/--/O POS (01/27 9378)  Imaging:  DG CHEST PORT 1 VIEW Result Date: 03/02/2023 CLINICAL DATA:  Bronchitis EXAM: PORTABLE CHEST 1 VIEW COMPARISON:  None Available. FINDINGS: The heart size and mediastinal contours are within normal limits. Both lungs are clear. The visualized skeletal structures are unremarkable. IMPRESSION: No active disease. Electronically Signed   By: Dorethia Molt M.D.   On: 03/02/2023 01:20     MAU Course/MDM: I have reviewed the triage vital signs and the nursing notes.   Pertinent labs & imaging results that were available during my care of the patient were reviewed by me and considered in my medical decision making (see chart for details).      I have reviewed her medical records including past results, notes and treatments.   I have ordered labs and reviewed results. These were normal except wet prep showed trichomonal vaginitis    Protein Creat Ration is  borderline at 0.29 NST reviewed  Treatments in MAU included EFM.  Maalox for reflux  Discussed presentation and results with patient after consulting Dr Alger Recommend IOL next week due to Gestational Hypertension.  Assessment: Single IUP at [redacted]w[redacted]d Threatened preterm labor Gestational hypertension Cocaine use Vaginal discharge Trichomonal vaginitis Productive cough with negative chest xray and testing  Plan: Discharge home Labor precautions and fetal kick counts Dr Alger will schedule IOL for next week Rx Flagyl  for Trich Encouraged to return if she develops worsening of symptoms, increase in pain, fever, or other concerning symptoms.   Follow up in Office for  prenatal visits and recheck   Pt stable at time of discharge.  Earnie Pouch CNM, MSN Certified Nurse-Midwife 03/02/2023 12:03 AM

## 2023-03-03 ENCOUNTER — Inpatient Hospital Stay (HOSPITAL_COMMUNITY): Payer: Medicaid Other | Admitting: Anesthesiology

## 2023-03-03 ENCOUNTER — Other Ambulatory Visit: Payer: Self-pay

## 2023-03-03 ENCOUNTER — Inpatient Hospital Stay (HOSPITAL_COMMUNITY)
Admission: AD | Admit: 2023-03-03 | Discharge: 2023-03-05 | DRG: 806 | Disposition: A | Discharge status: Home / Self Care | Payer: Medicaid Other | Attending: Obstetrics & Gynecology | Admitting: Obstetrics & Gynecology

## 2023-03-03 ENCOUNTER — Encounter (HOSPITAL_COMMUNITY): Payer: Self-pay | Admitting: Obstetrics & Gynecology

## 2023-03-03 DIAGNOSIS — O9832 Other infections with a predominantly sexual mode of transmission complicating childbirth: Secondary | ICD-10-CM | POA: Diagnosis present

## 2023-03-03 DIAGNOSIS — O34211 Maternal care for low transverse scar from previous cesarean delivery: Secondary | ICD-10-CM | POA: Diagnosis not present

## 2023-03-03 DIAGNOSIS — O99324 Drug use complicating childbirth: Secondary | ICD-10-CM | POA: Diagnosis present

## 2023-03-03 DIAGNOSIS — O99334 Smoking (tobacco) complicating childbirth: Secondary | ICD-10-CM | POA: Diagnosis present

## 2023-03-03 DIAGNOSIS — A6000 Herpesviral infection of urogenital system, unspecified: Secondary | ICD-10-CM | POA: Diagnosis present

## 2023-03-03 DIAGNOSIS — B009 Herpesviral infection, unspecified: Secondary | ICD-10-CM | POA: Insufficient documentation

## 2023-03-03 DIAGNOSIS — O9962 Diseases of the digestive system complicating childbirth: Secondary | ICD-10-CM | POA: Diagnosis present

## 2023-03-03 DIAGNOSIS — R058 Other specified cough: Secondary | ICD-10-CM | POA: Diagnosis not present

## 2023-03-03 DIAGNOSIS — A5901 Trichomonal vulvovaginitis: Secondary | ICD-10-CM | POA: Diagnosis present

## 2023-03-03 DIAGNOSIS — Z8249 Family history of ischemic heart disease and other diseases of the circulatory system: Secondary | ICD-10-CM

## 2023-03-03 DIAGNOSIS — Z881 Allergy status to other antibiotic agents status: Secondary | ICD-10-CM

## 2023-03-03 DIAGNOSIS — Z3A36 36 weeks gestation of pregnancy: Secondary | ICD-10-CM | POA: Diagnosis not present

## 2023-03-03 DIAGNOSIS — O42013 Preterm premature rupture of membranes, onset of labor within 24 hours of rupture, third trimester: Secondary | ICD-10-CM | POA: Diagnosis not present

## 2023-03-03 DIAGNOSIS — F17210 Nicotine dependence, cigarettes, uncomplicated: Secondary | ICD-10-CM | POA: Diagnosis present

## 2023-03-03 DIAGNOSIS — O9822 Gonorrhea complicating childbirth: Secondary | ICD-10-CM | POA: Diagnosis not present

## 2023-03-03 DIAGNOSIS — Z641 Problems related to multiparity: Secondary | ICD-10-CM

## 2023-03-03 DIAGNOSIS — O4703 False labor before 37 completed weeks of gestation, third trimester: Secondary | ICD-10-CM | POA: Diagnosis not present

## 2023-03-03 DIAGNOSIS — O0930 Supervision of pregnancy with insufficient antenatal care, unspecified trimester: Secondary | ICD-10-CM

## 2023-03-03 DIAGNOSIS — O34219 Maternal care for unspecified type scar from previous cesarean delivery: Secondary | ICD-10-CM | POA: Diagnosis present

## 2023-03-03 DIAGNOSIS — O134 Gestational [pregnancy-induced] hypertension without significant proteinuria, complicating childbirth: Secondary | ICD-10-CM | POA: Diagnosis present

## 2023-03-03 DIAGNOSIS — K219 Gastro-esophageal reflux disease without esophagitis: Secondary | ICD-10-CM | POA: Diagnosis present

## 2023-03-03 DIAGNOSIS — F1410 Cocaine abuse, uncomplicated: Secondary | ICD-10-CM | POA: Diagnosis present

## 2023-03-03 DIAGNOSIS — O9902 Anemia complicating childbirth: Secondary | ICD-10-CM | POA: Diagnosis present

## 2023-03-03 DIAGNOSIS — Z98891 History of uterine scar from previous surgery: Secondary | ICD-10-CM

## 2023-03-03 DIAGNOSIS — F1911 Other psychoactive substance abuse, in remission: Secondary | ICD-10-CM | POA: Diagnosis present

## 2023-03-03 DIAGNOSIS — Z3A35 35 weeks gestation of pregnancy: Secondary | ICD-10-CM | POA: Diagnosis not present

## 2023-03-03 DIAGNOSIS — O09529 Supervision of elderly multigravida, unspecified trimester: Secondary | ICD-10-CM

## 2023-03-03 DIAGNOSIS — O139 Gestational [pregnancy-induced] hypertension without significant proteinuria, unspecified trimester: Secondary | ICD-10-CM | POA: Diagnosis present

## 2023-03-03 DIAGNOSIS — O133 Gestational [pregnancy-induced] hypertension without significant proteinuria, third trimester: Secondary | ICD-10-CM | POA: Diagnosis not present

## 2023-03-03 LAB — CBC
HCT: 36.9 % (ref 36.0–46.0)
HCT: 30.7 % — ABNORMAL LOW (ref 36.0–46.0)
Hemoglobin: 12.1 g/dL (ref 12.0–15.0)
Hemoglobin: 10.1 g/dL — ABNORMAL LOW (ref 12.0–15.0)
MCH: 30 pg (ref 26.0–34.0)
MCH: 30.1 pg (ref 26.0–34.0)
MCHC: 32.8 g/dL (ref 30.0–36.0)
MCHC: 32.9 g/dL (ref 30.0–36.0)
MCV: 91.3 fL (ref 80.0–100.0)
MCV: 91.4 fL (ref 80.0–100.0)
Platelets: 271 10*3/uL (ref 150–400)
Platelets: 217 10*3/uL (ref 150–400)
RBC: 4.04 MIL/uL (ref 3.87–5.11)
RBC: 3.36 MIL/uL — ABNORMAL LOW (ref 3.87–5.11)
RDW: 14.1 % (ref 11.5–15.5)
RDW: 14.4 % (ref 11.5–15.5)
WBC: 15 10*3/uL — ABNORMAL HIGH (ref 4.0–10.5)
WBC: 15.3 10*3/uL — ABNORMAL HIGH (ref 4.0–10.5)
nRBC: 0 % (ref 0.0–0.2)
nRBC: 0 % (ref 0.0–0.2)

## 2023-03-03 LAB — RPR: RPR Ser Ql: NONREACTIVE

## 2023-03-03 LAB — TYPE AND SCREEN
ABO/RH(D): O POS
Antibody Screen: NEGATIVE

## 2023-03-03 MED ORDER — ACETAMINOPHEN 325 MG PO TABS
650.0000 mg | ORAL_TABLET | ORAL | Status: DC | PRN
  Administered 2023-03-04 (×3): 650 mg via ORAL
  Filled 2023-03-03 (×3): qty 2

## 2023-03-03 MED ORDER — ONDANSETRON HCL 4 MG PO TABS: 4.0000 mg | ORAL_TABLET | ORAL | Status: DC | PRN

## 2023-03-03 MED ORDER — SIMETHICONE 80 MG PO CHEW: 80.0000 mg | CHEWABLE_TABLET | ORAL | Status: DC | PRN

## 2023-03-03 MED ORDER — TRANEXAMIC ACID-NACL 1000-0.7 MG/100ML-% IV SOLN
INTRAVENOUS | Status: AC
  Filled 2023-03-03: qty 100

## 2023-03-03 MED ORDER — ZOLPIDEM TARTRATE 5 MG PO TABS: 5.0000 mg | ORAL_TABLET | Freq: Every evening | ORAL | Status: DC | PRN

## 2023-03-03 MED ORDER — LACTATED RINGERS IV SOLN
500.0000 mL | Freq: Once | INTRAVENOUS | Status: AC
  Administered 2023-03-03: 500 mL via INTRAVENOUS

## 2023-03-03 MED ORDER — DIBUCAINE (PERIANAL) 1 % EX OINT: 1.0000 | TOPICAL_OINTMENT | CUTANEOUS | Status: DC | PRN

## 2023-03-03 MED ORDER — SODIUM CHLORIDE 0.9 % IV SOLN
2.0000 g | Freq: Four times a day (QID) | INTRAVENOUS | Status: DC
  Administered 2023-03-03: 2 g via INTRAVENOUS
  Filled 2023-03-03: qty 2000

## 2023-03-03 MED ORDER — SENNOSIDES-DOCUSATE SODIUM 8.6-50 MG PO TABS
2.0000 | ORAL_TABLET | ORAL | Status: DC
  Administered 2023-03-04: 2 via ORAL
  Filled 2023-03-03 (×2): qty 2

## 2023-03-03 MED ORDER — METRONIDAZOLE 500 MG PO TABS
2000.0000 mg | ORAL_TABLET | Freq: Once | ORAL | Status: AC
  Administered 2023-03-03: 2000 mg via ORAL
  Filled 2023-03-03: qty 4

## 2023-03-03 MED ORDER — SODIUM CHLORIDE 0.9% FLUSH: 3.0000 mL | Freq: Two times a day (BID) | INTRAVENOUS | Status: DC

## 2023-03-03 MED ORDER — EPHEDRINE 5 MG/ML INJ: 10.0000 mg | INTRAVENOUS | Status: DC | PRN

## 2023-03-03 MED ORDER — PHENYLEPHRINE 80 MCG/ML (10ML) SYRINGE FOR IV PUSH (FOR BLOOD PRESSURE SUPPORT)
80.0000 ug | PREFILLED_SYRINGE | INTRAVENOUS | Status: DC | PRN
  Filled 2023-03-03: qty 10

## 2023-03-03 MED ORDER — MEASLES, MUMPS & RUBELLA VAC IJ SOLR: 0.5000 mL | Freq: Once | INTRAMUSCULAR | Status: DC

## 2023-03-03 MED ORDER — ACETAMINOPHEN 500 MG PO TABS
1000.0000 mg | ORAL_TABLET | ORAL | Status: DC | PRN
  Administered 2023-03-03: 1000 mg via ORAL
  Filled 2023-03-03: qty 2

## 2023-03-03 MED ORDER — OXYCODONE-ACETAMINOPHEN 5-325 MG PO TABS: 1.0000 | ORAL_TABLET | ORAL | Status: DC | PRN

## 2023-03-03 MED ORDER — GENTAMICIN SULFATE 40 MG/ML IJ SOLN
5.0000 mg/kg | INTRAVENOUS | Status: DC
  Filled 2023-03-03: qty 7.25

## 2023-03-03 MED ORDER — FENTANYL-BUPIVACAINE-NACL 0.5-0.125-0.9 MG/250ML-% EP SOLN
12.0000 mL/h | EPIDURAL | Status: DC | PRN
  Administered 2023-03-03: 12 mL/h via EPIDURAL
  Filled 2023-03-03: qty 250

## 2023-03-03 MED ORDER — PHENYLEPHRINE 80 MCG/ML (10ML) SYRINGE FOR IV PUSH (FOR BLOOD PRESSURE SUPPORT): 80.0000 ug | PREFILLED_SYRINGE | INTRAVENOUS | Status: DC | PRN

## 2023-03-03 MED ORDER — IBUPROFEN 600 MG PO TABS
600.0000 mg | ORAL_TABLET | Freq: Four times a day (QID) | ORAL | Status: DC
  Administered 2023-03-04 – 2023-03-05 (×6): 600 mg via ORAL
  Filled 2023-03-03 (×7): qty 1

## 2023-03-03 MED ORDER — OXYTOCIN-SODIUM CHLORIDE 30-0.9 UT/500ML-% IV SOLN
2.5000 [IU]/h | INTRAVENOUS | Status: DC
  Filled 2023-03-03 (×2): qty 500

## 2023-03-03 MED ORDER — PRENATAL MULTIVITAMIN CH
1.0000 | ORAL_TABLET | Freq: Every day | ORAL | Status: DC
  Administered 2023-03-04 – 2023-03-05 (×2): 1 via ORAL
  Filled 2023-03-03 (×2): qty 1

## 2023-03-03 MED ORDER — OXYCODONE-ACETAMINOPHEN 5-325 MG PO TABS: 2.0000 | ORAL_TABLET | ORAL | Status: DC | PRN

## 2023-03-03 MED ORDER — FENTANYL CITRATE (PF) 100 MCG/2ML IJ SOLN
50.0000 ug | Freq: Once | INTRAMUSCULAR | Status: AC
  Administered 2023-03-03: 50 ug via INTRAMUSCULAR
  Filled 2023-03-03: qty 2

## 2023-03-03 MED ORDER — SOD CITRATE-CITRIC ACID 500-334 MG/5ML PO SOLN: 30.0000 mL | ORAL | Status: DC | PRN

## 2023-03-03 MED ORDER — TETANUS-DIPHTH-ACELL PERTUSSIS 5-2.5-18.5 LF-MCG/0.5 IM SUSY: 0.5000 mL | PREFILLED_SYRINGE | Freq: Once | INTRAMUSCULAR | Status: DC

## 2023-03-03 MED ORDER — LACTATED RINGERS IV SOLN: INTRAVENOUS | Status: DC

## 2023-03-03 MED ORDER — DIPHENHYDRAMINE HCL 50 MG/ML IJ SOLN
12.5000 mg | INTRAMUSCULAR | Status: DC | PRN
  Administered 2023-03-03 (×2): 12.5 mg via INTRAVENOUS
  Filled 2023-03-03 (×2): qty 1

## 2023-03-03 MED ORDER — HYDROXYZINE HCL 25 MG PO TABS
25.0000 mg | ORAL_TABLET | Freq: Four times a day (QID) | ORAL | Status: DC | PRN
  Administered 2023-03-03: 25 mg via ORAL
  Filled 2023-03-03: qty 1

## 2023-03-03 MED ORDER — TRANEXAMIC ACID-NACL 1000-0.7 MG/100ML-% IV SOLN
1000.0000 mg | Freq: Once | INTRAVENOUS | Status: AC | PRN
  Administered 2023-03-03: 1000 mg via INTRAVENOUS

## 2023-03-03 MED ORDER — COCONUT OIL OIL: 1.0000 | TOPICAL_OIL | Status: DC | PRN

## 2023-03-03 MED ORDER — OXYTOCIN BOLUS FROM INFUSION
333.0000 mL | Freq: Once | INTRAVENOUS | Status: AC
  Administered 2023-03-03: 333 mL via INTRAVENOUS

## 2023-03-03 MED ORDER — SODIUM CHLORIDE 0.9% FLUSH: 3.0000 mL | INTRAVENOUS | Status: DC | PRN

## 2023-03-03 MED ORDER — LIDOCAINE-EPINEPHRINE (PF) 2 %-1:200000 IJ SOLN
INTRAMUSCULAR | Status: DC | PRN
  Administered 2023-03-03: 5 mL via EPIDURAL

## 2023-03-03 MED ORDER — ONDANSETRON HCL 4 MG/2ML IJ SOLN: 4.0000 mg | Freq: Four times a day (QID) | INTRAMUSCULAR | Status: DC | PRN

## 2023-03-03 MED ORDER — BENZOCAINE-MENTHOL 20-0.5 % EX AERO: 1.0000 | INHALATION_SPRAY | CUTANEOUS | Status: DC | PRN

## 2023-03-03 MED ORDER — LIDOCAINE HCL (PF) 1 % IJ SOLN: 30.0000 mL | INTRAMUSCULAR | Status: DC | PRN

## 2023-03-03 MED ORDER — LACTATED RINGERS IV SOLN
500.0000 mL | INTRAVENOUS | Status: DC | PRN
  Administered 2023-03-03: 500 mL via INTRAVENOUS

## 2023-03-03 MED ORDER — ONDANSETRON HCL 4 MG/2ML IJ SOLN: 4.0000 mg | INTRAMUSCULAR | Status: DC | PRN

## 2023-03-03 MED ORDER — FENTANYL CITRATE (PF) 100 MCG/2ML IJ SOLN
100.0000 ug | INTRAMUSCULAR | Status: DC | PRN
  Administered 2023-03-03: 100 ug via INTRAVENOUS
  Filled 2023-03-03: qty 2

## 2023-03-03 MED ORDER — ACETAMINOPHEN 325 MG PO TABS: 650.0000 mg | ORAL_TABLET | ORAL | Status: DC | PRN

## 2023-03-03 MED ORDER — WITCH HAZEL-GLYCERIN EX PADS: 1.0000 | MEDICATED_PAD | CUTANEOUS | Status: DC | PRN

## 2023-03-03 MED ORDER — POTASSIUM CHLORIDE CRYS ER 20 MEQ PO TBCR
20.0000 meq | EXTENDED_RELEASE_TABLET | Freq: Every day | ORAL | Status: DC
  Administered 2023-03-04 – 2023-03-05 (×3): 20 meq via ORAL
  Filled 2023-03-03 (×4): qty 1

## 2023-03-03 MED ORDER — SODIUM CHLORIDE 0.9 % IV SOLN: 250.0000 mL | INTRAVENOUS | Status: DC | PRN

## 2023-03-03 MED ORDER — FUROSEMIDE 20 MG PO TABS
20.0000 mg | ORAL_TABLET | Freq: Every day | ORAL | Status: DC
  Administered 2023-03-05: 20 mg via ORAL
  Filled 2023-03-03 (×2): qty 1

## 2023-03-03 NOTE — Anesthesia Procedure Notes (Signed)
 Epidural Patient location during procedure: OB Start time: 03/03/2023 5:25 AM End time: 03/03/2023 5:35 AM  Staffing Anesthesiologist: Niels Marien CROME, MD Performed: anesthesiologist   Preanesthetic Checklist Completed: patient identified, IV checked, risks and benefits discussed, monitors and equipment checked, pre-op evaluation and timeout performed  Epidural Patient position: sitting Prep: DuraPrep and site prepped and draped Patient monitoring: continuous pulse ox, blood pressure, heart rate and cardiac monitor Approach: midline Location: L3-L4 Injection technique: LOR air  Needle:  Needle type: Tuohy  Needle gauge: 17 G Needle length: 9 cm Needle insertion depth: 5 cm Catheter type: closed end flexible Catheter size: 19 Gauge Catheter at skin depth: 10 cm Test dose: negative  Assessment Sensory level: T8 Events: blood not aspirated, no cerebrospinal fluid, injection not painful, no injection resistance, no paresthesia and negative IV test  Additional Notes Patient identified. Risks/Benefits/Options discussed with patient including but not limited to bleeding, infection, nerve damage, paralysis, failed block, incomplete pain control, headache, blood pressure changes, nausea, vomiting, reactions to medication both or allergic, itching and postpartum back pain. Confirmed with bedside nurse the patient's most recent platelet count. Confirmed with patient that they are not currently taking any anticoagulation, have any bleeding history or any family history of bleeding disorders. Patient expressed understanding and wished to proceed. All questions were answered. Sterile technique was used throughout the entire procedure. Please see nursing notes for vital signs. Test dose was given through epidural catheter and negative prior to continuing to dose epidural or start infusion. Warning signs of high block given to the patient including shortness of breath, tingling/numbness in hands,  complete motor block, or any concerning symptoms with instructions to call for help. Patient was given instructions on fall risk and not to get out of bed. All questions and concerns addressed with instructions to call with any issues or inadequate analgesia.  Reason for block:procedure for pain

## 2023-03-03 NOTE — MAU Note (Signed)
..  Erica [name] is a 43 y.o. at [redacted]w[redacted]d here in MAU reporting: patient pulled from lobby due to intensity of contractions, reports contractions all day and has not been able to time them. Denies vaginal bleeding. Reports she has been leaking but is unsure if it is urine or if it is amniotic fluid. +FM.  Pain score: 10/10 Vitals:   03/03/23 0132  BP: (!) 135/97  Pulse: 93  Resp: 17  Temp: 98.1 F (36.7 C)  SpO2: 100%     FHT:140

## 2023-03-03 NOTE — Anesthesia Postprocedure Evaluation (Signed)
 Anesthesia Post Note  Patient: Erica [name]  Procedure(s) Performed: AN AD HOC LABOR EPIDURAL     Patient location during evaluation: Mother Baby Anesthesia Type: Epidural Level of consciousness: awake and alert Pain management: pain level controlled Vital Signs Assessment: post-procedure vital signs reviewed and stable Respiratory status: spontaneous breathing, nonlabored ventilation and respiratory function stable Cardiovascular status: stable Postop Assessment: no headache, no backache and epidural receding Anesthetic complications: no   No notable events documented.  Last Vitals:  Vitals:   03/03/23 1900 03/03/23 1926  BP: 113/73   Pulse: 83   Resp:    Temp:  (!) 38.1 C  SpO2: 96%     Last Pain:  Vitals:   03/03/23 1926  TempSrc: Axillary  PainSc:    Pain Goal:                   ECHOSTAR

## 2023-03-03 NOTE — Progress Notes (Signed)
 Brief Progress Note:  Pt feeling more pressure -- cx rechecked by bedside RN -- 6cm/90%/ballotable. Will consider AROM once fetal head better applied. Cat I strip. Rec position changes for now.   Alain Sor, MD OB Fellow, Faculty Practice [facility], Center for [facility]

## 2023-03-03 NOTE — Discharge Summary (Signed)
 Postpartum Discharge Summary      Patient Name: Erica [name] DOB: 06-26-1980 MRN: [id number]  Date of admission: 03/03/2023 Delivery date:03/03/2023 Delivering provider: VON REASONER Date of discharge: 03/05/2023  Admitting diagnosis: Preterm contractions [O47.00] Intrauterine pregnancy: [redacted]w[redacted]d     Secondary diagnosis:  Active Problems:   AMA (advanced maternal age) multigravida 35+   History of substance abuse (HCC)   History of cesarean delivery   History of VBAC   No prenatal care in current pregnancy   Grand multiparity   Gestational hypertension   HSV-2 infection complicating pregnancy, third trimester  Additional problems: n/a    Discharge diagnosis: Preterm Pregnancy Delivered, Gestational Hypertension, and substance use disorder (cocaine use during pregnancy), VBAC                                               Post partum procedures: n/a Augmentation: N/A Complications: None  Hospital course: Onset of Labor With Vaginal Delivery      43 y.o. yo [id number] at [redacted]w[redacted]d was admitted in Preterm Labor on 03/03/2023. Labor course was complicated by none.  Membrane Rupture Time/Date: 8:00 PM,03/03/2023  Delivery Method:VBAC, Spontaneous Operative Delivery:N/A Episiotomy:   Lacerations:  Periurethral Patient had a postpartum course complicated by gHTN requiring Lasix  with K supplementation as well as Procardia .  She is ambulating, tolerating a regular diet, passing flatus, and urinating well. Patient is discharged home in stable condition on 03/05/23.  Newborn Data: Birth date:03/03/2023 Birth time:8:19 PM Gender:Female Living status:Living Apgars:7 ,9  Weight:3150 g  Magnesium  Sulfate received: Yes: Hypomag with K supplementation given Lasix  tx  BMZ received: No Rhophylac:N/A MMR:N/A T-DaP: ordered prior to d/c  Flu: No RSV Vaccine received: No Transfusion:No  Immunizations received: Immunization History  Administered Date(s) Administered   MMR 11/21/2021   PPD  Test 09/18/2019   Tdap 02/02/2017    Physical exam  Vitals:   03/04/23 1225 03/04/23 2009 03/04/23 2238 03/05/23 0525  BP: 138/85 138/85 135/81 134/86  Pulse: (!) 59 73  (!) 58  Resp: 18 18  18   Temp: 97.6 F (36.4 C) 98 F (36.7 C)  98 F (36.7 C)  TempSrc: Oral Oral  Oral  SpO2:      Weight:      Height:       General: alert, cooperative, and no distress Lochia: appropriate Uterine Fundus: firm Incision: N/A DVT Evaluation: No evidence of DVT seen on physical exam. Labs: Lab Results  Component Value Date   WBC 12.7 (H) 03/04/2023   HGB 9.8 (L) 03/04/2023   HCT 29.8 (L) 03/04/2023   MCV 93.4 03/04/2023   PLT 220 03/04/2023      Latest Ref Rng & Units 03/04/2023    5:31 AM  CMP  Glucose 70 - 99 mg/dL 895   BUN 6 - 20 mg/dL 6   Creatinine 9.55 - 8.99 mg/dL 9.11   Sodium 864 - 854 mmol/L 141   Potassium 3.5 - 5.1 mmol/L 4.0   Chloride 98 - 111 mmol/L 112   CO2 22 - 32 mmol/L 23   Calcium  8.9 - 10.3 mg/dL 8.3   Total Protein 6.5 - 8.1 g/dL 4.5   Total Bilirubin 0.0 - 1.2 mg/dL 0.3   Alkaline Phos 38 - 126 U/L 125   AST 15 - 41 U/L 14   ALT 0 - 44 U/L 13  Edinburgh Score:    03/04/2023    4:44 PM  Edinburgh Postnatal Depression Scale Screening Tool  I have been able to laugh and see the funny side of things. 0  I have looked forward with enjoyment to things. 0  I have blamed myself unnecessarily when things went wrong. 1  I have been anxious or worried for no good reason. 1  I have felt scared or panicky for no good reason. 1  Things have been getting on top of me. 1  I have been so unhappy that I have had difficulty sleeping. 1  I have felt sad or miserable. 1  I have been so unhappy that I have been crying. 1  The thought of harming myself has occurred to me. 0  Edinburgh Postnatal Depression Scale Total 7   Edinburgh Postnatal Depression Scale Total: 7   After visit meds:  Allergies as of 03/05/2023       Reactions   Levaquin [levofloxacin] Hives,  Swelling, Other (See Comments)   Lip swelling Chest tightness        Medication List     TAKE these medications    acetaminophen  325 MG tablet Commonly known as: Tylenol  Take 2 tablets (650 mg total) by mouth every 4 (four) hours as needed (for pain scale < 4).   ferrous sulfate  325 (65 FE) MG EC tablet Take 1 tablet (325 mg total) by mouth 2 (two) times daily.   ferrous sulfate  325 (65 FE) MG tablet Take 1 tablet (325 mg total) by mouth every other day. Start taking on: March 06, 2023   fluticasone  50 MCG/ACT nasal spray Commonly known as: FLONASE  Place 1 spray into both nostrils daily.   furosemide  20 MG tablet Commonly known as: LASIX  Take 1 tablet (20 mg total) by mouth daily.   guaiFENesin  600 MG 12 hr tablet Commonly known as: Mucinex  Take 1 tablet (600 mg total) by mouth 2 (two) times daily.   ibuprofen  600 MG tablet Commonly known as: ADVIL  Take 1 tablet (600 mg total) by mouth every 6 (six) hours.   loratadine  10 MG tablet Commonly known as: CLARITIN  Take 1 tablet (10 mg total) by mouth daily for 14 days.   M-NATAL PLUS PO Take 1 tablet by mouth daily.   metroNIDAZOLE  500 MG tablet Commonly known as: FLAGYL  Take 1 tablet (500 mg total) by mouth 2 (two) times daily.   NIFEdipine  30 MG 24 hr tablet Commonly known as: ADALAT  CC Take 1 tablet (30 mg total) by mouth daily.   potassium chloride  SA 20 MEQ tablet Commonly known as: KLOR-CON  M Take 1 tablet (20 mEq total) by mouth daily.   senna-docusate 8.6-50 MG tablet Commonly known as: Senokot-S Take 2 tablets by mouth 2 (two) times daily as needed for mild constipation.         Discharge home in stable condition Infant Feeding: Bottle Infant Disposition: Placed with Sister by CPS Discharge instruction: per After Visit Summary and Postpartum booklet. Activity: Advance as tolerated. Pelvic rest for 6 weeks.  Diet: routine diet Future Appointments:No future appointments. Follow up  Visit: Message sent to [facility] 2/6  Please schedule this patient for a In person postpartum visit in 4 weeks with the following provider: Any provider. Additional Postpartum F/U:BP check 1 week  High risk pregnancy complicated by:  scant prenatal care, SUD (cocaine use disorder), gestational HTN Delivery mode:  VBAC, Spontaneous Anticipated Birth Control:  Plans Interval BTL   03/05/2023 Augustin JAYSON Slade, MD

## 2023-03-03 NOTE — H&P (Addendum)
 Erica [name] is a 43 y.o. [id number] female at [redacted]w[redacted]d by LMP c/w 34wk scan presenting with preterm contractions.   Reports active fetal movement, contractions: irregular, every 6-8 minutes, vaginal bleeding:  bloody show , membranes: intact.  No prenatal care during pregnancy. Most recent u/s 02/21/23 placenta anterior, AFI 17.8, estimated fetal weight 78%.   This pregnancy complicated by: No prenatal care Substance use cocaine  GHTN PTL Gonorrhea with neg TOC AMA  Prenatal History/Complications:  GHTN C/S x1  VBAC x3 Substance use  PTL  Past Medical History: Past Medical History:  Diagnosis Date   Abnormal Pap smear    Anemia    Anxiety    Depression    as teen   Drug addiction (HCC)    smokes cocaine per pt   GERD (gastroesophageal reflux disease)    Gonorrhea 01/02/2023   Headache(784.0)    Pneumonia    Postpartum depression    Pregnancy induced hypertension    Trichomonal vaginitis     Past Surgical History: Past Surgical History:  Procedure Laterality Date   APPENDECTOMY     CESAREAN SECTION  2008   COLPOSCOPY     DILATION AND CURETTAGE OF UTERUS  2010   LAPAROSCOPIC APPENDECTOMY N/A 07/11/2016   Procedure: APPENDECTOMY LAPAROSCOPIC;  Surgeon: Shelva Dunnings, MD;  Location: [facility] ORS;  Service: General;  Laterality: N/A;   WISDOM TOOTH EXTRACTION      Obstetrical History: OB History     Gravida  13   Para  8   Term  5   Preterm  3   AB  3   Living  8      SAB  2   IAB  0   Ectopic  1   Multiple  0   Live Births  8           Social History: Social History   Socioeconomic History   Marital status: Widowed    Spouse name: Not on file   Number of children: 7   Years of education: 10   Highest education level: GED or equivalent  Occupational History   Occupation: unemployeed  Tobacco Use   Smoking status: Every Day    Current packs/day: 1.00    Average packs/day: 1 pack/day for 13.0 years (13.0 ttl pk-yrs)    Types:  Cigarettes   Smokeless tobacco: Never  Vaping Use   Vaping status: Never Used  Substance and Sexual Activity   Alcohol use: Not Currently    Comment: occ   Drug use: Yes    Types: Cocaine    Comment: last used today 2/4   Sexual activity: Yes  Other Topics Concern   Not on file  Social History Narrative   Not on file   Social Drivers of Health   Financial Resource Strain: Low Risk  (05/25/2021)   Overall Financial Resource Strain (CARDIA)    Difficulty of Paying Living Expenses: Not hard at all  Food Insecurity: No Food Insecurity (11/20/2021)   Hunger Vital Sign    Worried About Running Out of Food in the Last Year: Never true    Ran Out of Food in the Last Year: Never true  Transportation Needs: No Transportation Needs (11/20/2021)   PRAPARE - Administrator, Civil Service (Medical): No    Lack of Transportation (Non-Medical): No  Physical Activity: Unknown (05/25/2021)   Exercise Vital Sign    Days of Exercise per Week: 7 days    Minutes of Exercise  per Session: Not on file  Stress: No Stress Concern Present (05/25/2021)   [employer] of Occupational Health - Occupational Stress Questionnaire    Feeling of Stress : Not at all  Social Connections: Moderately Isolated (05/25/2021)   Social Connection and Isolation Panel [NHANES]    Frequency of Communication with Friends and Family: More than three times a week    Frequency of Social Gatherings with Friends and Family: More than three times a week    Attends Religious Services: 1 to 4 times per year    Active Member of [employer] or Organizations: No    Attends Banker Meetings: Never    Marital Status: Widowed    Family History: Family History  Problem Relation Age of Onset   Hypertension Mother    Seizures Mother    Seizures Brother    Seizures Brother    Hypertension Maternal Grandmother    Arthritis Maternal Grandmother    Cancer Maternal Grandmother        breast   Stroke Maternal  Grandfather    Birth defects Daughter        hole in heart   Seizures Maternal Aunt    Seizures Cousin    Seizures Cousin    Mental retardation Cousin     Allergies: Allergies  Allergen Reactions   Levaquin [Levofloxacin] Hives, Swelling and Other (See Comments)    Lip swelling Chest tightness    Medications Prior to Admission  Medication Sig Dispense Refill Last Dose/Taking   fluticasone  (FLONASE ) 50 MCG/ACT nasal spray Place 1 spray into both nostrils daily. 16 g 0    guaiFENesin  (MUCINEX ) 600 MG 12 hr tablet Take 1 tablet (600 mg total) by mouth 2 (two) times daily. 60 tablet 0    loratadine  (CLARITIN ) 10 MG tablet Take 1 tablet (10 mg total) by mouth daily for 14 days. 14 tablet 0    metroNIDAZOLE  (FLAGYL ) 500 MG tablet Take 1 tablet (500 mg total) by mouth 2 (two) times daily. 14 tablet 0    Prenatal Vit-Fe Fumarate-FA (M-NATAL PLUS PO) Take 1 tablet by mouth daily. (Patient not taking: Reported on 03/23/2022)       Review of Systems  Pertinent pos/neg as indicated in HPI  Blood pressure (!) 135/97, pulse 93, temperature 98.1 F (36.7 C), temperature source Oral, resp. rate 17, height 5' 3.5 (1.613 m), weight 64.4 kg, last menstrual period 06/24/2022, SpO2 100%, not currently breastfeeding. General appearance: alert Lungs: clear to auscultation bilaterally Heart: regular rate and rhythm Abdomen: gravid, soft, non-tender Extremities: mild edema   Fetal monitoring: FHR: 120 bpm, variability: moderate,  Accelerations: Present,  decelerations:  Absent Uterine activity: Frequency: irregular Dilation: 4.5 Effacement (%): 70 Station: -3 Exam by:: A Evern RN Presentation: cephalic   Prenatal labs: ABO, Rh: --/--/O POS (01/27 9378) Antibody: NEG (01/27 0621) Rubella: 1.43 (01/27 0623) RPR: NON REACTIVE (01/27 0623)  HBsAg: NON REACTIVE (01/27 9376)  HIV: Non Reactive (01/27 0623)  GBS:   neg 2hr GTT: None  Prenatal Transfer Tool  Maternal Diabetes: No  prenatal care Genetic Screening: No prenatal care Maternal Ultrasounds/Referrals: Other: No prenatal care Fetal Ultrasounds or other Referrals:  None Maternal Substance Abuse:  Yes:  Type: Cocaine Significant Maternal Medications:  None Significant Maternal Lab Results: Group B Strep negative  No results found for this or any previous visit (from the past 24 hours).   Assessment:  [redacted]w[redacted]d SIUP  [id number]  PTL contractions  Cat 1 FHR  GBS  neg  Plan:  Admit to L&D  IV pain meds/epidural prn active labor  Expectant Management, recheck cervix prn before epidural  Anticipate SVD   Plans to feed: undecided  Contraception: undecided  Circumcision: Unknown gender  Lauraine CHRISTELLA Plume CNM 03/03/2023, 3:30 AM   CNM attestation:  I have seen and examined this patient; I agree with above documentation in the student midwife's note.   SHEYENNE KONZ is a 43 y.o. [id number] here for preterm ctx with cx change  PE: BP (!) 92/57 Comment: pt. on side  Pulse (!) 109   Temp 97.6 F (36.4 C) (Axillary)   Resp 19   Ht 5' 3.5 (1.613 m)   Wt 64.4 kg   LMP 06/24/2022 (Approximate)   SpO2 97%   BMI 24.76 kg/m  Gen: calm comfortable, NAD Resp: normal effort, no distress Abd: gravid  ROS, labs, PMH reviewed  Plan: Admit to Labor and Delivery Expectant management; will base plan of care on her cervical progression  Suzen JONETTA Gentry CNM 03/03/2023, 8:31 AM

## 2023-03-03 NOTE — Progress Notes (Signed)
 Patient ID: TAJAH NOGUCHI, female   DOB: 09/25/1980, 43 y.o.   MRN: [id number]  Comfortable with epidural  BPs 111/69, 133/82 FHR 125-130s, +accels, no decels Ctx q 4-6 mins Cx deferred  IUP@36 .0wks Preterm ctx Prev C/S & VBACs gHTN  Prior to epidural placement, Fentanyl  IM in MAU and IV on L&D had been given with no significant relief; pt aware that we aren't sure of labor status (prodromal v early active) and therefore if ctx space out, we are limited in options and there could be the potential that the epidural is pulled and she is discharged home- she understands that the epidural placement is not a guarantee that she is delivering the baby soon.  Suzen JONETTA Gentry CNM 03/03/2023 8:00 AM

## 2023-03-03 NOTE — Progress Notes (Signed)
 Pharmacy Antibiotic Note  Erica [name] is a 43 y.o. female admitted on 03/03/2023 with contractions at [redacted]w[redacted]d.  Pharmacy has been consulted for Gentamicin  dosing for chorioamnionitis.  Plan: Gentamicin  5mg /kg (290mg ) IV q24h  Will continue to follow  Height: 5' 3.5 (161.3 cm) Weight: 64.4 kg (142 lb) IBW/kg (Calculated) : 53.55 Adjusted/Dosing weigh: 57.9kg  Temp (24hrs), Avg:98.5 F (36.9 C), Min:97.6 F (36.4 C), Max:100.6 F (38.1 C)  Recent Labs  Lab 03/02/23 0106 03/03/23 0346  WBC 13.2* 15.0*  CREATININE 0.69  --     Estimated Creatinine Clearance: 84.6 mL/min (by C-G formula based on SCr of 0.69 mg/dL).    Allergies  Allergen Reactions   Levaquin [Levofloxacin] Hives, Swelling and Other (See Comments)    Lip swelling Chest tightness    Antimicrobials this admission: Ampicillin  2 gram IV q6h 2/6 >>    Thank you for allowing pharmacy to be a part of this patient's care.  Clayborne Alfonso MATSU 03/03/2023 7:51 PM

## 2023-03-03 NOTE — Progress Notes (Signed)
 Labor Progress Note Erica [name] is a 43 y.o. [id number] at [redacted]w[redacted]d presented for preterm ctx, monitoring for PTL  S: No concerns, comfortable w epidural.   O:  BP (!) 94/58   Pulse 93   Temp 98 F (36.7 C) (Axillary)   Resp 19   Ht 5' 3.5 (1.613 m)   Wt 64.4 kg   LMP 06/24/2022 (Approximate)   SpO2 95%   BMI 24.76 kg/m  EFM: 135/mod/+a/-d  CVE: Dilation: 5 Effacement (%): 80 Station: -3 Presentation: Vertex Exam by:: Darlys Duel, RN   A&P: 43 y.o. [id number] [redacted]w[redacted]d here for preterm contractions, c/f PTL #Labor: Has progressed from 2.5 to 4.5-5 on last check, due for next check around 1000 to help decide next stpes. Has been contracting q2-6 min.  #Pain: Epidural #FWB: Cat I #GBS  negative  #Hx HSV: Spec exam performed, no lesions  #Cocaine use disorder:   #Hx pre-e: at increased risk based on hx substance use  #Hx PPD  hx GAD/MDD: early PPD eval  #AMA  #No PNC  Alain Sor, MD 10:02 AM

## 2023-03-03 NOTE — Progress Notes (Signed)
 Brief Progress Note:  Sleepy w Benadryl , no concerns. Was having subtle variables, but better w position change. Cat I tracing now. Cx 5.5/80/-3, so making change. Discussed expectant management w pt.   Alain Sor, MD OB Fellow, Faculty Practice [facility], [facility]

## 2023-03-03 NOTE — Anesthesia Preprocedure Evaluation (Signed)
 Anesthesia Evaluation  Patient identified by MRN, date of birth, ID band Patient awake    Reviewed: Allergy & Precautions, NPO status , Patient's Chart, lab work & pertinent test results  Airway Mallampati: II  TM Distance: >3 FB Neck ROM: Full    Dental no notable dental hx.    Pulmonary neg pulmonary ROS, Current SmokerPatient did not abstain from smoking.   Pulmonary exam normal breath sounds clear to auscultation       Cardiovascular hypertension (gHTN), Normal cardiovascular exam Rhythm:Regular Rate:Normal     Neuro/Psych  Headaches PSYCHIATRIC DISORDERS Anxiety Depression       GI/Hepatic ,GERD  ,,(+)     substance abuse  cocaine use  Endo/Other  negative endocrine ROS    Renal/GU negative Renal ROS  negative genitourinary   Musculoskeletal negative musculoskeletal ROS (+)    Abdominal   Peds  Hematology negative hematology ROS (+)   Anesthesia Other Findings No PNC, h/o C/Sx1  Reproductive/Obstetrics (+) Pregnancy                             Anesthesia Physical Anesthesia Plan  ASA: 3  Anesthesia Plan: Epidural   Post-op Pain Management:    Induction:   PONV Risk Score and Plan: Treatment may vary due to age or medical condition  Airway Management Planned: Natural Airway  Additional Equipment:   Intra-op Plan:   Post-operative Plan:   Informed Consent: I have reviewed the patients History and Physical, chart, labs and discussed the procedure including the risks, benefits and alternatives for the proposed anesthesia with the patient or authorized representative who has indicated his/her understanding and acceptance.       Plan Discussed with: Anesthesiologist  Anesthesia Plan Comments: (Patient identified. Risks, benefits, options discussed with patient including but not limited to bleeding, infection, nerve damage, paralysis, failed block, incomplete pain  control, headache, blood pressure changes, nausea, vomiting, reactions to medication, itching, and post partum back pain. Confirmed with bedside nurse the patient's most recent platelet count. Confirmed with the patient that they are not taking any anticoagulation, have any bleeding history or any family history of bleeding disorders. Patient expressed understanding and wishes to proceed. All questions were answered. )       Anesthesia Quick Evaluation

## 2023-03-04 LAB — CBC
HCT: 29.8 % — ABNORMAL LOW (ref 36.0–46.0)
Hemoglobin: 9.8 g/dL — ABNORMAL LOW (ref 12.0–15.0)
MCH: 30.7 pg (ref 26.0–34.0)
MCHC: 32.9 g/dL (ref 30.0–36.0)
MCV: 93.4 fL (ref 80.0–100.0)
Platelets: 220 10*3/uL (ref 150–400)
RBC: 3.19 MIL/uL — ABNORMAL LOW (ref 3.87–5.11)
RDW: 14.4 % (ref 11.5–15.5)
WBC: 12.7 10*3/uL — ABNORMAL HIGH (ref 4.0–10.5)
nRBC: 0 % (ref 0.0–0.2)

## 2023-03-04 LAB — COMPREHENSIVE METABOLIC PANEL
ALT: 13 U/L (ref 0–44)
AST: 14 U/L — ABNORMAL LOW (ref 15–41)
Albumin: 1.7 g/dL — ABNORMAL LOW (ref 3.5–5.0)
Alkaline Phosphatase: 125 U/L (ref 38–126)
Anion gap: 6 (ref 5–15)
BUN: 6 mg/dL (ref 6–20)
CO2: 23 mmol/L (ref 22–32)
Calcium: 8.3 mg/dL — ABNORMAL LOW (ref 8.9–10.3)
Chloride: 112 mmol/L — ABNORMAL HIGH (ref 98–111)
Creatinine, Ser: 0.88 mg/dL (ref 0.44–1.00)
GFR, Estimated: >60 mL/min (ref >60)
Glucose, Bld: 104 mg/dL — ABNORMAL HIGH (ref 70–99)
Potassium: 4 mmol/L (ref 3.5–5.1)
Sodium: 141 mmol/L (ref 135–145)
Total Bilirubin: 0.3 mg/dL (ref 0.0–1.2)
Total Protein: 4.5 g/dL — ABNORMAL LOW (ref 6.5–8.1)

## 2023-03-04 LAB — MAGNESIUM: Magnesium: 1.9 mg/dL (ref 1.7–2.4)

## 2023-03-04 MED ORDER — NIFEDIPINE ER OSMOTIC RELEASE 30 MG PO TB24
30.0000 mg | ORAL_TABLET | Freq: Every day | ORAL | Status: DC
  Administered 2023-03-04 – 2023-03-05 (×2): 30 mg via ORAL
  Filled 2023-03-04 (×2): qty 1

## 2023-03-04 MED ORDER — FERROUS SULFATE 325 (65 FE) MG PO TABS
325.0000 mg | ORAL_TABLET | ORAL | Status: DC
  Administered 2023-03-04: 325 mg via ORAL
  Filled 2023-03-04: qty 1

## 2023-03-04 MED ORDER — CALCIUM CARBONATE ANTACID 500 MG PO CHEW
1.0000 | CHEWABLE_TABLET | Freq: Three times a day (TID) | ORAL | Status: DC
  Administered 2023-03-04 – 2023-03-05 (×3): 200 mg via ORAL
  Filled 2023-03-04 (×3): qty 1

## 2023-03-04 MED ORDER — FAMOTIDINE 20 MG PO TABS
20.0000 mg | ORAL_TABLET | Freq: Two times a day (BID) | ORAL | Status: DC
  Filled 2023-03-04: qty 1

## 2023-03-04 MED ORDER — HYDROXYZINE HCL 25 MG PO TABS
25.0000 mg | ORAL_TABLET | Freq: Three times a day (TID) | ORAL | Status: DC | PRN
  Administered 2023-03-04: 25 mg via ORAL
  Filled 2023-03-04: qty 1

## 2023-03-04 NOTE — Progress Notes (Signed)
 CSW has been updated by CPS social worker Alandria regarding the discharge plan for the infant. Per Alandria there are safety concerns with the infant being in the room alone with MOB; and the infant will be placed in the nursery; MOB is allowed to visit in the nursery, but they are to be supervised.    Per CPS social worker Alandria a TSP placement has been arranged and that is who the infant will be discharged to once medically ready.     TSP  Brittney lyles, MOB's sister   CPS social worker Alandria [phone]  Rosina Molt, [facility] Clinical Social Worker [phone]

## 2023-03-04 NOTE — Clinical Social Work Maternal (Signed)
 CLINICAL SOCIAL WORK MATERNAL/CHILD NOTE  Patient Details  Name: Erica [name] MRN: [id number] Date of Birth: 1980/11/25  Date:  03/04/2023  Clinical Social Worker Initiating Note:  Rosina Molt Date/Time: Initiated:  03/04/23/1254     Child's Name:  not named yet   Biological Parents:  Mother, Father Erica [name] 10/05/1980 Erica [name])   Need for Interpreter:  None   Reason for Referral:  Current Substance Use/Substance Use During Pregnancy  , Late or No Prenatal Care     Address:  [address] [place] [address]    Phone number:  [phone] (home)     Additional phone number:   Household Members/Support Persons (HM/SP):   Household Member/Support Person 1, Household Member/Support Person 2   HM/SP Name Relationship DOB or Age  HM/SP -1 Erica [name] MOB's mom    HM/SP -2   MOB's mom husband    HM/SP -3        HM/SP -4        HM/SP -5        HM/SP -6        HM/SP -7        HM/SP -8          Natural Supports (not living in the home):  Parent   Professional Supports: None   Employment: Unemployed   Type of Work:     Education:  Engineer, agricultural   Homebound arranged:    Surveyor, Quantity Resources:      Other Resources:      Cultural/Religious Considerations Which May Impact Care:    Strengths:      Psychotropic Medications:         Pediatrician:       Pediatrician List: was given a list by support staff  [facility]      Pediatrician Fax Number:    Risk Factors/Current Problems:  Substance Use     Cognitive State:  Alert     Mood/Affect:  Agitated  , Angry  , Blunted  , Irritable     CSW Assessment: CSW received a consult due to drug exposed newborn; and per chart review SDOH determined difficulty with housing, food, transportation and Utilities. CSW met MOB at bedside to complete a full psychosocial assessment and offer support. CSW  entered the room, introduced herself and explained the reason for the visit. MOB presented as lethargic and was agreeable to consult. CSW observed the infant being placed in the warmers prior to visiting with MOB due to her not being able to regulate her body temperature.   CSW collected MOB's demographic information; and she reported she will be residing with her mom at [address] [place] [address]. MOB reported CPS hx with her 42 year old that was placed with a TSP Erica [name] who is MOB's cousin; and her 73 year old  is also placed with a TSP Erica [name] who is MOB's aunt/cousin. MOB reported she has 4 adult children and  75 year old Erica [name] and 42 year old Erica [name] is with her mom no further information was given regarding CPS involvement. MOB reported FOB Erica [name] is currently incarcerated, and she is unaware how long; and the reason she moved in with her mom to get clean.   CSW inquired about MOB's mental health history. MOB denied any/all mental health history. CSW  provided education regarding the baby blues period vs. perinatal mood disorders, discussed treatment and gave resources for mental health follow up if concerns arise.  CSW recommends self-evaluation during the postpartum time period using the New Mom Checklist from Postpartum Progress and encouraged MOB to contact a medical professional if symptoms are noted at any time.    CSW asked MOB about having the essential items for the infant. MOB reported waiting for her mom to buy and bring her the carseat and purchase a safe sleeping area for the infant. CSW asked MOB about supportive resources WIC/foodstamps. MOB said no and reported she has not had time to begin the process for these services. CSW will complete a referral for [facility] and provide the necessary documents to apply for foodstamps in [facility].  CSW asked MOB about the barriers she experienced with obtaining PNC. MOB reported she had a [name] going on and was not  able to obtain [facility].  CSW observed MOB becoming frustrated with the questions/conversation due to being tired and MOB saying she has a drug problem and wanted to rest while the infant was in the warmers. CSW informed MOB due to cocaine and No PNC during her pregnancy; the hospital will perform a UDS and CDS on the infant. If the screenings return with positive results a report to CPS will be made; MOB was understanding.  CSW informed MOB due to the infant's urine testing positive for cocaine, a CPS report will be made; MOB reported she knows how the process works.  CSW has completed a CPS report with [employer]; and waiting the arrival of the child psychotherapist.  CSW Plan/Description:   No Further Intervention Required/No Barriers to Discharge, Sudden Infant Death Syndrome (SIDS) Education, Perinatal Mood and Anxiety Disorder (PMADs) Education, Hospital Drug Screen Policy Information, Other Information/Referral to [facility], CSW Will Continue to Monitor Umbilical Cord Tissue Drug Screen Results and Make Report if Erica [name] 03/04/2023, 12:55 PM

## 2023-03-04 NOTE — Progress Notes (Signed)
 POSTPARTUM PROGRESS NOTE  Post Partum Day #1  Subjective:  Erica [name] is a 42 y.o. [id number] s/p VBAC at [redacted]w[redacted]d.  She reports she is doing well. No acute events overnight. She denies any problems with ambulating, voiding or po intake. Denies nausea or vomiting.  Pain is moderately controlled.  Lochia is light.  Objective: Blood pressure 118/72, pulse (!) 47, temperature 97.6 F (36.4 C), temperature source Oral, resp. rate 17, height 5' 3.5 (1.613 m), weight 64.4 kg, last menstrual period 06/24/2022, SpO2 99%, unknown if currently breastfeeding.  Physical Exam:  General: alert, cooperative and no distress Chest: no respiratory distress Heart:regular rate, distal pulses intact Abdomen: soft, nontender,  Uterine Fundus: firm, appropriately tender DVT Evaluation: No calf swelling or tenderness Skin: warm, dry  Recent Labs    03/03/23 2235 03/04/23 0531  HGB 10.1* 9.8*  HCT 30.7* 29.8*    Assessment/Plan: Erica [name] is a 43 y.o. [id number] s/p VBAC at [redacted]w[redacted]d   PPD#1 - Doing well  Routine postpartum care Anemia: start PO iron Contraception: BTL but no medicaid consent signed. Discussed alternatives including Depo and Nexplanon, she will think about it. Gestational hypertension: normotensive at present, on lasix , K is normal, defer BP meds for now.  Cocaine use disorder: UDS positive on admission, has long history. Reports she is going to live with her mother and has plans to go to treatment. Follow up SW consult.  Feeding: Bottle Dispo: Plan for discharge PPD#2.   LOS: 1 day    Erica CHRISTELLA Carolus, MD/MPH Attending Family Medicine Physician, [facility] for [facility], [facility]  03/04/2023, 10:43 AM

## 2023-03-04 NOTE — Progress Notes (Addendum)
 Received word from nursing patient refusing antihypertensives despite meeting criteria for PP HTN.  Went to bedside to discuss with patient.  She was concerned that dose would lower her BP too much and that she would be urinating too much to take the lasix .  I reassured her that we would watch her vitals to ensure her blood pressures don't get to soft but that it would be atypical for her minimal procardia  dose to lower her too much.  Pt agreeable to take procardia  this evening. I also am okay with her getting her lasix  in the morning instead of now given concerns for nocturia.  Pt would like a PRN for sleep instead of Ambien .  PRN of atarax  placed for patient.  Nursing notified and agreeable with plan.  No other concerns at this time.   Augustin Slade, MD Family Medicine - Obstetrics Fellow

## 2023-03-05 ENCOUNTER — Other Ambulatory Visit (HOSPITAL_COMMUNITY): Payer: Self-pay

## 2023-03-05 MED ORDER — MAGNESIUM SULFATE BOLUS VIA INFUSION
2.0000 g | Freq: Once | INTRAVENOUS | Status: DC
  Filled 2023-03-05: qty 1000

## 2023-03-05 MED ORDER — IBUPROFEN 600 MG PO TABS
600.0000 mg | ORAL_TABLET | Freq: Four times a day (QID) | ORAL | 0 refills | Status: DC
  Filled 2023-03-05: qty 30, 8d supply, fill #0

## 2023-03-05 MED ORDER — ACETAMINOPHEN 325 MG PO TABS
650.0000 mg | ORAL_TABLET | ORAL | 0 refills | Status: DC | PRN
  Filled 2023-03-05: qty 30, 3d supply, fill #0

## 2023-03-05 MED ORDER — FERROUS SULFATE 325 (65 FE) MG PO TBEC: 325.0000 mg | DELAYED_RELEASE_TABLET | Freq: Two times a day (BID) | ORAL | 3 refills | Status: DC

## 2023-03-05 MED ORDER — SENNOSIDES-DOCUSATE SODIUM 8.6-50 MG PO TABS
2.0000 | ORAL_TABLET | Freq: Two times a day (BID) | ORAL | 0 refills | Status: DC | PRN
  Filled 2023-03-05: qty 60, 15d supply, fill #0

## 2023-03-05 MED ORDER — IBUPROFEN 600 MG PO TABS: 600.0000 mg | ORAL_TABLET | Freq: Four times a day (QID) | ORAL | 0 refills | Status: DC

## 2023-03-05 MED ORDER — SENNOSIDES-DOCUSATE SODIUM 8.6-50 MG PO TABS: 2.0000 | ORAL_TABLET | Freq: Two times a day (BID) | ORAL | 0 refills | Status: DC | PRN

## 2023-03-05 MED ORDER — POTASSIUM CHLORIDE CRYS ER 20 MEQ PO TBCR: 20.0000 meq | EXTENDED_RELEASE_TABLET | Freq: Every day | ORAL | 0 refills | Status: DC

## 2023-03-05 MED ORDER — FUROSEMIDE 20 MG PO TABS: 20.0000 mg | ORAL_TABLET | Freq: Every day | ORAL | 0 refills | Status: DC

## 2023-03-05 MED ORDER — NIFEDIPINE ER 30 MG PO TB24
30.0000 mg | ORAL_TABLET | Freq: Every day | ORAL | 0 refills | Status: DC
  Filled 2023-03-05: qty 30, 30d supply, fill #0

## 2023-03-05 MED ORDER — FERROUS SULFATE 325 (65 FE) MG PO TABS
325.0000 mg | ORAL_TABLET | ORAL | 3 refills | Status: DC
  Filled 2023-03-05: qty 30, 60d supply, fill #0

## 2023-03-05 MED ORDER — LACTATED RINGERS IV SOLN: INTRAVENOUS | Status: DC

## 2023-03-05 MED ORDER — FERROUS SULFATE 325 (65 FE) MG PO TABS: 325.0000 mg | ORAL_TABLET | ORAL | 3 refills | Status: DC

## 2023-03-05 MED ORDER — FUROSEMIDE 20 MG PO TABS
20.0000 mg | ORAL_TABLET | Freq: Every day | ORAL | 0 refills | Status: DC
  Filled 2023-03-05: qty 4, 4d supply, fill #0

## 2023-03-05 MED ORDER — ACETAMINOPHEN 325 MG PO TABS: 650.0000 mg | ORAL_TABLET | ORAL | 0 refills | Status: DC | PRN

## 2023-03-05 MED ORDER — POTASSIUM CHLORIDE CRYS ER 20 MEQ PO TBCR
20.0000 meq | EXTENDED_RELEASE_TABLET | Freq: Every day | ORAL | 0 refills | Status: DC
  Filled 2023-03-05: qty 4, 4d supply, fill #0

## 2023-03-05 MED ORDER — MAGNESIUM OXIDE -MG SUPPLEMENT 400 (240 MG) MG PO TABS
400.0000 mg | ORAL_TABLET | Freq: Every day | ORAL | 1 refills | Status: DC
  Filled 2023-03-05: qty 60, 30d supply, fill #0

## 2023-03-05 MED ORDER — NIFEDIPINE ER 30 MG PO TB24: 30.0000 mg | ORAL_TABLET | Freq: Every day | ORAL | 0 refills | Status: DC

## 2023-03-05 NOTE — Progress Notes (Signed)
 The Rn called the MD regarding the patients's Blood pressures (138/85 2009 and 1225 during the day 138/95). In addition, the Rn reported to MD per report from the day shift Rn , the patient refused to take her lasix . Currently, the patient is refusing to take her procardia . The MD stated that he would see the patient.

## 2023-03-05 NOTE — Plan of Care (Signed)
   Problem: Education: Goal: Knowledge of General Education information will improve Description: Including pain rating scale, medication(s)/side effects and non-pharmacologic comfort measures Outcome: Completed/Met

## 2023-03-05 NOTE — Progress Notes (Signed)
 Asked mom if she had the babyscript app and she stated she did not. Informed her to download the app so we could make sure she would be able to put her blood pressures in the app. She continually stated she would do it later. No attempt was made to download the app and she was discharged without verifying that she could input her BP's into the babyscripts app.   [place], RN

## 2023-03-06 ENCOUNTER — Encounter (HOSPITAL_COMMUNITY): Payer: Self-pay | Admitting: Obstetrics and Gynecology

## 2023-03-06 ENCOUNTER — Inpatient Hospital Stay (HOSPITAL_COMMUNITY)
Admission: AD | Admit: 2023-03-06 | Discharge: 2023-03-06 | Disposition: A | Discharge status: Home / Self Care | Payer: Medicaid Other | Attending: Obstetrics and Gynecology | Admitting: Obstetrics and Gynecology

## 2023-03-06 DIAGNOSIS — O9089 Other complications of the puerperium, not elsewhere classified: Secondary | ICD-10-CM | POA: Insufficient documentation

## 2023-03-06 DIAGNOSIS — R5383 Other fatigue: Secondary | ICD-10-CM | POA: Diagnosis present

## 2023-03-06 DIAGNOSIS — J019 Acute sinusitis, unspecified: Secondary | ICD-10-CM

## 2023-03-06 LAB — COMPREHENSIVE METABOLIC PANEL
ALT: 26 U/L (ref 0–44)
AST: 30 U/L (ref 15–41)
Albumin: 2.6 g/dL — ABNORMAL LOW (ref 3.5–5.0)
Alkaline Phosphatase: 155 U/L — ABNORMAL HIGH (ref 38–126)
Anion gap: 12 (ref 5–15)
BUN: 12 mg/dL (ref 6–20)
CO2: 23 mmol/L (ref 22–32)
Calcium: 9.2 mg/dL (ref 8.9–10.3)
Chloride: 104 mmol/L (ref 98–111)
Creatinine, Ser: 0.67 mg/dL (ref 0.44–1.00)
GFR, Estimated: >60 mL/min (ref >60)
Glucose, Bld: 84 mg/dL (ref 70–99)
Potassium: 4.3 mmol/L (ref 3.5–5.1)
Sodium: 139 mmol/L (ref 135–145)
Total Bilirubin: 0.6 mg/dL (ref 0.0–1.2)
Total Protein: 6.5 g/dL (ref 6.5–8.1)

## 2023-03-06 LAB — CBC
HCT: 43.1 % (ref 36.0–46.0)
Hemoglobin: 14.1 g/dL (ref 12.0–15.0)
MCH: 30 pg (ref 26.0–34.0)
MCHC: 32.7 g/dL (ref 30.0–36.0)
MCV: 91.7 fL (ref 80.0–100.0)
Platelets: 364 10*3/uL (ref 150–400)
RBC: 4.7 MIL/uL (ref 3.87–5.11)
RDW: 14.3 % (ref 11.5–15.5)
WBC: 14.3 10*3/uL — ABNORMAL HIGH (ref 4.0–10.5)
nRBC: 0 % (ref 0.0–0.2)

## 2023-03-06 MED ORDER — AMOXICILLIN-POT CLAVULANATE 875-125 MG PO TABS: 1.0000 | ORAL_TABLET | Freq: Two times a day (BID) | ORAL | 0 refills | Status: AC

## 2023-03-06 NOTE — MAU Note (Signed)
 Erica [name] is a 43 y.o. at [redacted]w[redacted]d here in MAU reporting: vag delivery 2/6, was discharged on 2/9.  Was given all these meds, just don't feel right.  Feels light headed, feels like she is in a big bubble. Onset of complaint: this morning. BP was 140/90, took the BP med, lasix , potassium and Ibuprofen , just started feeling weird.  Feel a little nauseous right now. Been having some loose bowels.  Is worried to death, she has never felt bad like this after a delivery. Nose is still stopped up, thinks she has a sinus infection.  Pain score: 2 Vitals:   03/06/23 1604  BP: 123/86  Pulse: (!) 120  Resp: 18  Temp: 97.9 F (36.6 C)  SpO2: 98%      Lab orders placed from triage:

## 2023-03-06 NOTE — MAU Provider Note (Signed)
 MAU Provider Note  Chief Complaint: don't feel right  SUBJECTIVE HPI: Erica [name] is a 42 y.o. [id number] at postpartum day 3 after SVD at 36 weeks who presents to maternity admissions reporting all these meds, I just don't feel right. Pregnancy c/b no prenatal care, cocaine use.   Feeling fatigued, and out of it. Reports eating well, hydrating. Baby is in NICU for NAS stay; placed by CPS with patients sister. Taking Procardia , Lasix  and Potassium as prescribed but worried that it is making her feel weird. Never needed medication after delivery with previous births.   Nasal and chest congestion x 2 weeks, without fever. Sometimes feels infected but no pain associated.  Reports blood tinged boogers.   HPI  Past Medical History:  Diagnosis Date   Abnormal Pap smear    Anemia    Anxiety    Depression    as teen   Drug addiction (HCC)    smokes cocaine per pt   GERD (gastroesophageal reflux disease)    Gonorrhea 01/02/2023   Headache(784.0)    Pneumonia    Postpartum depression    Pregnancy induced hypertension    Trichomonal vaginitis    Past Surgical History:  Procedure Laterality Date   APPENDECTOMY     CESAREAN SECTION  2008   COLPOSCOPY     DILATION AND CURETTAGE OF UTERUS  2010   LAPAROSCOPIC APPENDECTOMY N/A 07/11/2016   Procedure: APPENDECTOMY LAPAROSCOPIC;  Surgeon: Shelva Dunnings, MD;  Location: [facility] ORS;  Service: General;  Laterality: N/A;   WISDOM TOOTH EXTRACTION     Social History   Socioeconomic History   Marital status: Widowed    Spouse name: Not on file   Number of children: 7   Years of education: 10   Highest education level: GED or equivalent  Occupational History   Occupation: unemployeed  Tobacco Use   Smoking status: Every Day    Current packs/day: 1.00    Average packs/day: 1 pack/day for 13.0 years (13.0 ttl pk-yrs)    Types: Cigarettes   Smokeless tobacco: Never  Vaping Use   Vaping status: Never Used  Substance and Sexual  Activity   Alcohol use: Not Currently    Comment: occ   Drug use: Yes    Types: Cocaine    Comment: last used today 2/4   Sexual activity: Not Currently  Other Topics Concern   Not on file  Social History Narrative   Not on file   Social Drivers of Health   Financial Resource Strain: Low Risk  (05/25/2021)   Overall Financial Resource Strain (CARDIA)    Difficulty of Paying Living Expenses: Not hard at all  Food Insecurity: Food Insecurity Present (03/03/2023)   Hunger Vital Sign    Worried About Running Out of Food in the Last Year: Often true    Ran Out of Food in the Last Year: Often true  Transportation Needs: Unmet Transportation Needs (03/03/2023)   PRAPARE - Administrator, Civil Service (Medical): Yes    Lack of Transportation (Non-Medical): Yes  Physical Activity: Unknown (05/25/2021)   Exercise Vital Sign    Days of Exercise per Week: 7 days    Minutes of Exercise per Session: Not on file  Stress: No Stress Concern Present (05/25/2021)   [employer] of Occupational Health - Occupational Stress Questionnaire    Feeling of Stress : Not at all  Social Connections: Moderately Isolated (05/25/2021)   Social Connection and Isolation Panel [NHANES]  Frequency of Communication with Friends and Family: More than three times a week    Frequency of Social Gatherings with Friends and Family: More than three times a week    Attends Religious Services: 1 to 4 times per year    Active Member of [employer] or Organizations: No    Attends Banker Meetings: Never    Marital Status: Widowed  Intimate Partner Violence: Unknown (03/03/2023)   Humiliation, Afraid, Rape, and Kick questionnaire    Fear of Current or Ex-Partner: No    Emotionally Abused: No    Physically Abused: Not on file    Sexually Abused: No   No current facility-administered medications on file prior to encounter.   Current Outpatient Medications on File Prior to Encounter  Medication Sig  Dispense Refill   furosemide  (LASIX ) 20 MG tablet Take 1 tablet (20 mg total) by mouth daily. 4 tablet 0   ibuprofen  (ADVIL ) 600 MG tablet Take 1 tablet (600 mg total) by mouth every 6 (six) hours. 30 tablet 0   magnesium  oxide (MAG-OX) 400 (240 Mg) MG tablet Take 1 tablet (400 mg total) by mouth daily. Increase to twice daily if symptoms persists. 60 tablet 1   NIFEdipine  (ADALAT  CC) 30 MG 24 hr tablet Take 1 tablet (30 mg total) by mouth daily. 30 tablet 0   potassium chloride  SA (KLOR-CON  M) 20 MEQ tablet Take 1 tablet (20 mEq total) by mouth daily. 4 tablet 0   acetaminophen  (TYLENOL ) 325 MG tablet Take 2 tablets (650 mg total) by mouth every 4 (four) hours as needed for pain. 30 tablet 0   ferrous sulfate  325 (65 FE) MG EC tablet Take 1 tablet (325 mg total) by mouth 2 (two) times daily. 60 tablet 3   ferrous sulfate  325 (65 FE) MG tablet Take 1 tablet (325 mg total) by mouth every other day. 30 tablet 3   fluticasone  (FLONASE ) 50 MCG/ACT nasal spray Place 1 spray into both nostrils daily. 16 g 0   guaiFENesin  (MUCINEX ) 600 MG 12 hr tablet Take 1 tablet (600 mg total) by mouth 2 (two) times daily. 60 tablet 0   loratadine  (CLARITIN ) 10 MG tablet Take 1 tablet (10 mg total) by mouth daily for 14 days. 14 tablet 0   metroNIDAZOLE  (FLAGYL ) 500 MG tablet Take 1 tablet (500 mg total) by mouth 2 (two) times daily. 14 tablet 0   Prenatal Vit-Fe Fumarate-FA (M-NATAL PLUS PO) Take 1 tablet by mouth daily. (Patient not taking: Reported on 03/23/2022)     senna-docusate (SENOKOT-S) 8.6-50 MG tablet Take 2 tablets by mouth 2 (two) times daily as needed for mild constipation. 60 tablet 0   Allergies  Allergen Reactions   Levaquin [Levofloxacin] Hives, Swelling and Other (See Comments)    Lip swelling Chest tightness    ROS:  Pertinent positives/negatives listed above.  I have reviewed patient's Past Medical Hx, Surgical Hx, Family Hx, Social Hx, medications and allergies.   Physical Exam  Patient  Vitals for the past 24 hrs:  BP Temp Temp src Pulse Resp SpO2  03/06/23 1604 123/86 97.9 F (36.6 C) Oral (!) 120 18 98 %   Constitutional: Well-developed, well-nourished female in no acute distress. Tired. Cardiovascular: normal rate and rhythm, no RMG  Respiratory: normal effort, CTAB GI: Abd soft, non-tender, fundus firm well below umbilicus MS: Extremities nontender, no edema, normal ROM. No cords.  Neurologic: Alert and oriented x 4, no clonus, DTR 2+ GU: Neg CVAT.  LAB RESULTS Results  for orders placed or performed during the hospital encounter of 03/06/23 (from the past 24 hours)  CBC     Status: Abnormal   Collection Time: 03/06/23  4:55 PM  Result Value Ref Range   WBC 14.3 (H) 4.0 - 10.5 K/uL   RBC 4.70 3.87 - 5.11 MIL/uL   Hemoglobin 14.1 12.0 - 15.0 g/dL   HCT 56.8 63.9 - 53.9 %   MCV 91.7 80.0 - 100.0 fL   MCH 30.0 26.0 - 34.0 pg   MCHC 32.7 30.0 - 36.0 g/dL   RDW 85.6 88.4 - 84.4 %   Platelets 364 150 - 400 K/uL   nRBC 0.0 0.0 - 0.2 %  Comprehensive metabolic panel     Status: Abnormal   Collection Time: 03/06/23  4:55 PM  Result Value Ref Range   Sodium 139 135 - 145 mmol/L   Potassium 4.3 3.5 - 5.1 mmol/L   Chloride 104 98 - 111 mmol/L   CO2 23 22 - 32 mmol/L   Glucose, Bld 84 70 - 99 mg/dL   BUN 12 6 - 20 mg/dL   Creatinine, Ser 9.32 0.44 - 1.00 mg/dL   Calcium  9.2 8.9 - 10.3 mg/dL   Total Protein 6.5 6.5 - 8.1 g/dL   Albumin 2.6 (L) 3.5 - 5.0 g/dL   AST 30 15 - 41 U/L   ALT 26 0 - 44 U/L   Alkaline Phosphatase 155 (H) 38 - 126 U/L   Total Bilirubin 0.6 0.0 - 1.2 mg/dL   GFR, Estimated >39 >39 mL/min   Anion gap 12 5 - 15    --/--/O POS (02/06 0325)  IMAGING DG CHEST PORT 1 VIEW Result Date: 03/02/2023 CLINICAL DATA:  Bronchitis EXAM: PORTABLE CHEST 1 VIEW COMPARISON:  None Available. FINDINGS: The heart size and mediastinal contours are within normal limits. Both lungs are clear. The visualized skeletal structures are unremarkable. IMPRESSION: No  active disease. Electronically Signed   By: Dorethia Molt M.D.   On: 03/02/2023 01:20   US  MFM OB Detail + 14 Weeks Result Date: 02/21/2023 ----------------------------------------------------------------------  OBSTETRICS REPORT                       (Signed Final 02/21/2023 02:18 pm) ---------------------------------------------------------------------- Patient Info  ID #:       [id number]                          D.O.B.:  1980/02/11 (42 yrs)(F)  Name:       Erica [name]                    Visit Date: 02/21/2023 07:05 am ---------------------------------------------------------------------- Performed By  Attending:        Delora Smaller DO       Referred By:      [facility] MAU/Triage  Performed By:     Nat Plant       [facility] ---------------------------------------------------------------------- Orders  #  Description  Code        Ordered By  1  US  MFM OB DETAIL +14 WK               [id number]    ROLITTA DAWSON ----------------------------------------------------------------------  #  Order #                     Accession #                Episode #  1  [id number]                   [id number]                 [id number] ---------------------------------------------------------------------- Indications  Insufficient Prenatal Care                     O09.30  [redacted] weeks gestation of pregnancy                Z3A.34  Drug use complicating pregnancy, third         O99.323  trimester (cocaine today)  Encounter for antenatal screening,             Z36.9  unspecified  Abdominal pain in pregnancy                    O99.89  Advanced maternal age primigravida 36+,        O36.513  third trimester ---------------------------------------------------------------------- Fetal Evaluation  Num Of Fetuses:         1  Fetal Heart Rate(bpm):  117  Cardiac Activity:       Observed  Presentation:           Cephalic   Placenta:               Anterior  P. Cord Insertion:      Visualized, central  Amniotic Fluid  AFI FV:      Within normal limits  AFI Sum(cm)     %Tile       Largest Pocket(cm)  17.8            66          5.4  RUQ(cm)       RLQ(cm)       LUQ(cm)        LLQ(cm)  3.4           3.9           5.1            5.4  Comment:    No placental abruption or previa identified by ultrasound. ---------------------------------------------------------------------- Biometry  BPD:      88.5  mm     G. Age:  35w 5d         82  %    CI:        74.56   %    70 - 86                                                          FL/HC:      20.5   %    20.1 - 22.3  HC:      325.3  mm     G. Age:  36w 6d  73  %    HC/AC:      1.01        0.93 - 1.11  AC:      321.8  mm     G. Age:  36w 1d         90  %    FL/BPD:     75.5   %    71 - 87  FL:       66.8  mm     G. Age:  34w 3d         36  %    FL/AC:      20.8   %    20 - 24  HUM:      59.2  mm     G. Age:  34w 2d         57  %  Est. FW:    2745  gm      6 lb 1 oz     78  % ---------------------------------------------------------------------- OB History  Gravidity:    13        Term:   6        Prem:   3        SAB:   2  TOP:          0       Ectopic:  1        Living: 8 ---------------------------------------------------------------------- Gestational Age  LMP:           34w 4d        Date:  06/24/22                  EDD:   03/31/23  U/S Today:     35w 6d                                        EDD:   03/22/23  Best:          34w 4d     Det. By:  LMP  (06/24/22)          EDD:   03/31/23 ---------------------------------------------------------------------- Targeted Anatomy  Central Nervous System  Calvarium/Cranial V.:  Appears normal         Cereb./Vermis:          Not well visualized  Cavum:                 Appears normal         Cisterna Magna:         Not well visualized  Lateral Ventricles:    Not well visualized    Midline Falx:           Appears normal  Choroid Plexus:        Not well  visualized  Spine  Cervical:              Limited                Sacral:                 Limited  Thoracic:              Limited                Shape/Curvature:        Appears normal  Lumbar:  Limited  Head/Neck  Lips:                  Appears normal         Profile:                Not well visualized  Neck:                  Not well visualized    Orbits/Eyes:            Not well visualized  Nuchal Fold:           Not applicable         Mandible:               Not well visualized  Nasal Bone:            Not well visualized    Maxilla:                Not well visualized  Thorax  4 Chamber View:        Appears normal         Interventr. Septum:     Appears normal  Cardiac Rhythm:        Normal                 Cardiac Axis:           Normal  Cardiac Situs:         Appears normal         Diaphragm:              Appears normal  Rt Outflow Tract:      Not well visualized    3 Vessel View:          Appears normal  Lt Outflow Tract:      Not well visualized    3 V Trachea View:       Not well visualized  Aortic Arch:           Appears normal         IVC:                    Not well visualized  Ductal Arch:           Not well visualized    Crossing:               Not well visualized  SVC:                   Not well visualized  Abdomen  Ventral Wall:          Not well visualized    Lt Kidney:              Appears normal  Cord Insertion:        Not well visualized    Rt Kidney:              Appears normal  Situs:                 Appears normal         Bladder:                Appears normal  Stomach:               Appears normal  Extremities  Lt Humerus:            Appears normal  Lt Femur:               Not well visualized  Rt Humerus:            Appears normal         Rt Femur:               Not well visualized  Lt Forearm:            Not well visualized    Lt Lower Leg:           Not well visualized  Rt Forearm:            Appears normal         Rt Lower Leg:           Not well visualized  Lt Hand:                Not well visualized    Lt Foot:                Not well visualized  Rt Hand:               Not well visualized    Rt Foot:                Not well visualized  Other  Umbilical Cord:        Not well visualized    Genitalia:              Not well visualized ---------------------------------------------------------------------- Cervix Uterus Adnexa  Cervix  Not visualized (advanced GA >24wks) ---------------------------------------------------------------------- Comments  Hospital Ultrasound  The patient presented to the MAU for contractions. The  patient did not tolerate the exam well and imaging is  suboptimal. She request to leave AMA per the EMR.  Sonographic findings  Single intrauterine pregnancy at 34w 4d  Fetal cardiac activity: Observed and appears normal.  Presentation: Cephalic.  Limited fetal anatomy appears normal.  Amniotic fluid: Within normal limits.  MVP: 5.4 cm.  Placenta: Anterior. There is no sonographic evidence of  bleeding.  Recommendations  - EDD is 03/31/2023 dated by by LMP  (06/24/22).  - At least weekly outpatient testing should occur due multiple  maternal indications  - While there is no sonogrpahic evidence of placental  bleeding, placental abruption is a clinical diagnosis and  ultrasound findings of placental beeding are seen in less than  25% of cases.  - Continue clinical management per OB provider.  This was a limited ultrasound with a remote read. If an official  MFM consult is requested for any reason please call/place an  order in Epic. ----------------------------------------------------------------------                  Delora Smaller, DO Electronically Signed Final Report   02/21/2023 02:18 pm ----------------------------------------------------------------------    MAU Management/MDM: Orders Placed This Encounter  Procedures   CBC   Comprehensive metabolic panel    Meds ordered this encounter  Medications   amoxicillin -clavulanate (AUGMENTIN ) 875-125 MG  tablet    Sig: Take 1 tablet by mouth 2 (two) times daily for 7 days.    Dispense:  14 tablet    Refill:  0   Available prenatal records reviewed.  ASSESSMENT 1. Postpartum state   2. Fatigue, unspecified type   3. Subacute sinusitis, unspecified location   Postpartum day 3 after SVD on 2/6 complicated by gHTN, cocaine use, and no prenatal care  - Normotensive - Asymptomatic from a preeclampsia perspective -  CBC and CMP reassuring  Will treat sinus infection with Augmentin  x7 days  PLAN Discharge home with strict return precautions. Allergies as of 03/06/2023       Reactions   Levaquin [levofloxacin] Hives, Swelling, Other (See Comments)   Lip swelling Chest tightness        Medication List     TAKE these medications    acetaminophen  325 MG tablet Commonly known as: Tylenol  Take 2 tablets (650 mg total) by mouth every 4 (four) hours as needed for pain.   amoxicillin -clavulanate 875-125 MG tablet Commonly known as: AUGMENTIN  Take 1 tablet by mouth 2 (two) times daily for 7 days.   ferrous sulfate  325 (65 FE) MG EC tablet Take 1 tablet (325 mg total) by mouth 2 (two) times daily.   FeroSul 325 (65 Fe) MG tablet Generic drug: ferrous sulfate  Take 1 tablet (325 mg total) by mouth every other day.   fluticasone  50 MCG/ACT nasal spray Commonly known as: FLONASE  Place 1 spray into both nostrils daily.   furosemide  20 MG tablet Commonly known as: LASIX  Take 1 tablet (20 mg total) by mouth daily.   guaiFENesin  600 MG 12 hr tablet Commonly known as: Mucinex  Take 1 tablet (600 mg total) by mouth 2 (two) times daily.   ibuprofen  600 MG tablet Commonly known as: ADVIL  Take 1 tablet (600 mg total) by mouth every 6 (six) hours.   loratadine  10 MG tablet Commonly known as: CLARITIN  Take 1 tablet (10 mg total) by mouth daily for 14 days.   M-NATAL PLUS PO Take 1 tablet by mouth daily.   magnesium  oxide 400 (240 Mg) MG tablet Commonly known as: MAG-OX Take 1  tablet (400 mg total) by mouth daily. Increase to twice daily if symptoms persists.   metroNIDAZOLE  500 MG tablet Commonly known as: FLAGYL  Take 1 tablet (500 mg total) by mouth 2 (two) times daily.   NIFEdipine  30 MG 24 hr tablet Commonly known as: ADALAT  CC Take 1 tablet (30 mg total) by mouth daily.   potassium chloride  SA 20 MEQ tablet Commonly known as: KLOR-CON  M Take 1 tablet (20 mEq total) by mouth daily.   Senna-S 8.6-50 MG tablet Generic drug: senna-docusate Take 2 tablets by mouth 2 (two) times daily as needed for mild constipation.        Follow-up Information     [facility] Follow up.   Specialty: Obstetrics and Gynecology Why: As scheduled for prenatal care Contact information: [address] [place]   [address] [phone]                Mardy Shropshire, MD FMOB Fellow, Faculty practice [facility], [facility]  03/06/2023  5:40 PM

## 2023-03-10 ENCOUNTER — Inpatient Hospital Stay (HOSPITAL_COMMUNITY): Payer: Medicaid Other

## 2023-03-11 ENCOUNTER — Telehealth (HOSPITAL_COMMUNITY): Payer: Self-pay | Admitting: *Deleted

## 2023-03-11 NOTE — Telephone Encounter (Signed)
03/11/2023  Name: NARYAH CLENNEY MRN: [id number] DOB: [birth year]  Reason for Call:  Transition of Care Hospital Discharge Call  Contact Status: Patient Contact Status: Message  Language assistant needed:          Follow-Up Questions:    Inocente Salles Postnatal Depression Scale:  In the Past 7 Days:    PHQ2-9 Depression Scale:     Discharge Follow-up:    Post-discharge interventions: NA  Salena Saner, RN 03/11/2023 14:40

## 2023-04-12 ENCOUNTER — Other Ambulatory Visit (HOSPITAL_COMMUNITY)
Admission: RE | Admit: 2023-04-12 | Discharge: 2023-04-12 | Disposition: A | Discharge status: Home / Self Care | Source: Ambulatory Visit | Attending: Family Medicine | Admitting: Family Medicine

## 2023-04-12 ENCOUNTER — Other Ambulatory Visit: Payer: Self-pay

## 2023-04-12 ENCOUNTER — Ambulatory Visit: Admitting: Family Medicine

## 2023-04-12 VITALS — BP 129/87 | HR 103 | Ht 60.3 in | Wt 130.0 lb

## 2023-04-12 DIAGNOSIS — K5900 Constipation, unspecified: Secondary | ICD-10-CM | POA: Diagnosis not present

## 2023-04-12 DIAGNOSIS — Z113 Encounter for screening for infections with a predominantly sexual mode of transmission: Secondary | ICD-10-CM | POA: Insufficient documentation

## 2023-04-12 DIAGNOSIS — Z3009 Encounter for other general counseling and advice on contraception: Secondary | ICD-10-CM

## 2023-04-12 DIAGNOSIS — Z3202 Encounter for pregnancy test, result negative: Secondary | ICD-10-CM | POA: Diagnosis not present

## 2023-04-12 DIAGNOSIS — F1911 Other psychoactive substance abuse, in remission: Secondary | ICD-10-CM

## 2023-04-12 DIAGNOSIS — R300 Dysuria: Secondary | ICD-10-CM

## 2023-04-12 DIAGNOSIS — Z124 Encounter for screening for malignant neoplasm of cervix: Secondary | ICD-10-CM | POA: Insufficient documentation

## 2023-04-12 DIAGNOSIS — N760 Acute vaginitis: Secondary | ICD-10-CM

## 2023-04-12 DIAGNOSIS — Z3042 Encounter for surveillance of injectable contraceptive: Secondary | ICD-10-CM | POA: Diagnosis not present

## 2023-04-12 DIAGNOSIS — B9689 Other specified bacterial agents as the cause of diseases classified elsewhere: Secondary | ICD-10-CM

## 2023-04-12 DIAGNOSIS — N898 Other specified noninflammatory disorders of vagina: Secondary | ICD-10-CM

## 2023-04-12 DIAGNOSIS — Z1231 Encounter for screening mammogram for malignant neoplasm of breast: Secondary | ICD-10-CM

## 2023-04-12 LAB — POCT PREGNANCY, URINE: Preg Test, Ur: NEGATIVE

## 2023-04-12 MED ORDER — NITROFURANTOIN MONOHYD MACRO 100 MG PO CAPS: 100.0000 mg | ORAL_CAPSULE | Freq: Two times a day (BID) | ORAL | 0 refills | Status: DC

## 2023-04-12 MED ORDER — POLYETHYLENE GLYCOL 3350 17 GM/SCOOP PO POWD: 17.0000 g | Freq: Every day | ORAL | 1 refills | Status: AC | PRN

## 2023-04-12 MED ORDER — MEDROXYPROGESTERONE ACETATE 150 MG/ML IM SUSY
150.0000 mg | PREFILLED_SYRINGE | Freq: Once | INTRAMUSCULAR | Status: AC
  Administered 2023-04-12: 150 mg via INTRAMUSCULAR

## 2023-04-12 NOTE — Progress Notes (Unsigned)
 Post Partum Visit Note  Erica [name] is a 43 y.o. [id number] female who presents for a postpartum visit. She is 5 weeks 5 days postpartum following a normal spontaneous vaginal delivery.  I have fully reviewed the prenatal and intrapartum course. The delivery was at 36 gestational weeks.  Anesthesia: epidural. Postpartum course has been uneventful. Baby is doing well. Baby is feeding by  bottle . Bleeding no bleeding. Bowel function is abnormal; constipation. Bladder function is abnormal: strong odor . Patient is sexually active. Contraception method is Depo-Provera injections. Postpartum depression screening: not completed.   The pregnancy intention screening data noted above was reviewed. Potential methods of contraception were discussed. The patient elected to proceed with No data recorded.    Health Maintenance Due  Topic Date Due   Pneumococcal Vaccine 17-20 Years old (1 of 2 - PCV) Never done    The following portions of the patient's history were reviewed and updated as appropriate: allergies, current medications, past family history, past medical history, past social history, past surgical history, and problem list.  Review of Systems Pertinent items noted in HPI and remainder of comprehensive ROS otherwise negative.  Objective:  BP 129/87   Pulse (!) 103   Ht 5' 0.3" (1.532 m)   Wt 130 lb (59 kg)   SpO2 97%   Breastfeeding No   BMI 25.14 kg/m    General:  alert, cooperative, and appears stated age   Breasts:  not indicated  Lungs: Comfortalbe on room air  Wound N/a  GU exam:  normal        Assessment:   Vaginal odor  Postpartum exam  Encounter for screening examination for sexually transmitted infection - Plan: Cervicovaginal ancillary only  Screening for cervical cancer - Plan: Cytology - PAP( [place])  Dysuria - Plan: nitrofurantoin, macrocrystal-monohydrate, (MACROBID) 100 MG capsule, CANCELED: Urine Culture  Constipation, unspecified constipation  type - Plan: polyethylene glycol powder (GLYCOLAX/MIRALAX) 17 GM/SCOOP powder  Encounter for Depo-Provera contraception - Plan: medroxyPROGESTERone Acetate SUSY 150 mg  Screening mammogram for breast cancer - Plan: MM 3D SCREENING MAMMOGRAM BILATERAL BREAST  Unwanted fertility  History of substance abuse (HCC)  Normal postpartum exam.   Plan:   Essential components of care per ACOG recommendations:  1.  Mood and well being: Patient with n/a depression screening today. Reviewed local resources for support.  - Patient tobacco use? Yes. Not addressed.  - hx of drug use? Yes. Discussed support systems and outpatient/inpatient treatment options.    2. Infant care and feeding:  -Patient currently breastmilk feeding? No.  -Social determinants of health (SDOH) reviewed in EPIC.  3. Sexuality, contraception and birth spacing - Patient does not want a pregnancy in the next year.   - Reviewed reproductive life planning. Reviewed contraceptive methods based on pt preferences and effectiveness.  Patient desired Depo today but long term would like BTL, see below.   - Discussed birth spacing of 18 months  4. Sleep and fatigue -Encouraged family/partner/community support of 4 hrs of uninterrupted sleep to help with mood and fatigue  5. Physical Recovery  - Discussed patients delivery and complications. She describes her labor as mixed. - Patient had a Vaginal, no problems at delivery. Patient had a periuretheral laceration. Perineal healing reviewed. Patient expressed understanding - Patient has urinary incontinence? No. - Patient is safe to resume physical and sexual activity  6.  Health Maintenance - HM due items addressed Yes - Last pap smear  Diagnosis  Date Value  Ref Range Status  03/19/2020   Final   - Negative for intraepithelial lesion or malignancy (NILM)   Pap smear done at today's visit.  -Breast Cancer screening indicated? Yes. Patient referred today for mammogram.   7.  Chronic Disease/Pregnancy Condition follow up:  see below 1. Vaginal odor   2. Postpartum exam   3. Encounter for screening examination for sexually transmitted infection   4. Screening for cervical cancer   5. Dysuria   6. Constipation, unspecified constipation type   7. Encounter for Depo-Provera contraception   8. Screening mammogram for breast cancer   9. Unwanted fertility   10. History of substance abuse (HCC)    3. Screening exam per patient request, swabs only (declines serologies  4. Pap collected today  5. Empiric treatment sent, Ucx ordered  6. Rx sent for miralax  8. Ordered for mammo  9. BTL consent signed today, will have her scheduled with Gyn surgeon to discuss lap BTL  10. Patient has placement in [facility] facility for substance abuse. Requests letter saying she is medically cleared which was provided   - PCP follow up   Venora Maples, MD/MPH Attending Family Medicine Physician, [facility] for [facility], [facility]

## 2023-04-12 NOTE — Progress Notes (Unsigned)
 Patient with questions while in office about drying up her milk. She is not breast feeding and does not plan to. She reports her breasts are softer and no longer hurting, however she does have some milk still leaking. Reviewed limiting stimulation to the breast, not releasing milk. Reviewed can use cold crushed cabbage leaves ad lid, peppermint altoids ad lib or 12 hour Sudafed ( 1 BID for 3 days). Patient reports she just wanted to make sure that her still having some milk is not uncommon, reviewed it can take weeks for milk to dry up for some women.    She has no further questions or concerns and will call back with any other questions or concerns.

## 2023-04-13 LAB — POCT URINALYSIS DIP (DEVICE)
Bilirubin Urine: NEGATIVE
Glucose, UA: NEGATIVE mg/dL
Hgb urine dipstick: NEGATIVE
Ketones, ur: NEGATIVE mg/dL
Nitrite: NEGATIVE
Protein, ur: NEGATIVE mg/dL
Specific Gravity, Urine: >=1.03 (ref 1.005–1.030)
Urobilinogen, UA: 0.2 mg/dL (ref 0.0–1.0)
pH: 5.5 (ref 5.0–8.0)

## 2023-04-14 ENCOUNTER — Encounter: Payer: Self-pay | Admitting: Family Medicine

## 2023-04-14 LAB — CERVICOVAGINAL ANCILLARY ONLY
Bacterial Vaginitis (gardnerella): POSITIVE — AB
Candida Glabrata: NEGATIVE
Candida Vaginitis: NEGATIVE
Chlamydia: NEGATIVE
Comment: NEGATIVE
Comment: NEGATIVE
Comment: NEGATIVE
Comment: NEGATIVE
Comment: NEGATIVE
Comment: NORMAL
Neisseria Gonorrhea: NEGATIVE
Trichomonas: NEGATIVE

## 2023-04-14 MED ORDER — METRONIDAZOLE 0.75 % VA GEL: 1.0000 | Freq: Every day | VAGINAL | 0 refills | Status: AC

## 2023-04-15 ENCOUNTER — Encounter: Payer: Self-pay | Admitting: Family Medicine

## 2023-04-15 LAB — CYTOLOGY - PAP
Comment: NEGATIVE
Diagnosis: NEGATIVE
High risk HPV: NEGATIVE

## 2023-04-19 ENCOUNTER — Encounter: Payer: Self-pay | Admitting: *Deleted

## 2023-05-06 ENCOUNTER — Telehealth: Admitting: Family Medicine

## 2023-05-06 DIAGNOSIS — K047 Periapical abscess without sinus: Secondary | ICD-10-CM | POA: Diagnosis not present

## 2023-05-06 MED ORDER — PENICILLIN V POTASSIUM 500 MG PO TABS: 500.0000 mg | ORAL_TABLET | Freq: Three times a day (TID) | ORAL | 0 refills | Status: AC

## 2023-05-06 NOTE — Patient Instructions (Signed)
 Dental Abscess  A dental abscess is an infection around a tooth that may involve pain, swelling, and a collection of pus, as well as other symptoms. Treatment is important to help with symptoms and to prevent the infection from spreading. The general types of dental abscesses are: Pulpal abscess. This abscess may form from the inner part of the tooth (pulp). Periodontal abscess. This abscess may form from the gum. What are the causes? This condition is caused by a bacterial infection in or around the tooth. It may result from: Severe tooth decay (cavities). Trauma to the tooth, such as a broken or chipped tooth. What increases the risk? This condition is more likely to develop in males. It is also more likely to develop in people who: Have cavities. Have severe gum disease. Eat sugary snacks between meals. Use tobacco products. Have diabetes. Have a weakened disease-fighting system (immune system). Do not brush and care for their teeth regularly. What are the signs or symptoms? Mild symptoms of this condition include: Tenderness. Bad breath. Fever. A bitter taste in the mouth. Pain in and around the infected tooth. Moderate symptoms of this condition include: Swollen neck glands. Chills. Pus drainage. Swelling and redness around the infected tooth, in the mouth, or in the face. Severe pain in and around the infected tooth. Severe symptoms of this condition include: Difficulty swallowing. Difficulty opening the mouth. Nausea. Vomiting. How is this diagnosed? This condition is diagnosed based on: Your symptoms and your medical and dental history. An examination of the infected tooth. During the exam, your dental care provider may tap on the infected tooth. You may also need to have X-rays taken of the affected area. How is this treated? This condition is treated by getting rid of the infection. This may be done with: Antibiotic medicines. These may be used in certain  situations. Antibacterial mouth rinse. Incision and drainage. This procedure is done by making an incision in the abscess to drain out the pus. Removing pus is the first priority in treating an abscess. A root canal. This may be performed to save the tooth. Your dental care provider accesses the visible part of your tooth (crown) with a drill and removes any infected pulp. Then the space is filled and sealed off. Tooth extraction. The tooth is pulled out if it cannot be saved by other treatment. You may also receive treatment for pain, such as: Acetaminophen or NSAIDs. Gels that contain a numbing medicine. An injection to block the pain near your nerve. Follow these instructions at home: Medicines Take over-the-counter and prescription medicines only as told by your dental care provider. If you were prescribed an antibiotic, take it as told by your dental care provider. Do not stop taking the antibiotic even if you start to feel better. If you were prescribed a gel that contains a numbing medicine, use it exactly as told in the directions. Do not use these gels for children who are younger than 80 years of age. Use an antibacterial mouth rinse as told by your dental care provider. General instructions  Gargle with a mixture of salt and water 3-4 times a day or as needed. To make salt water, completely dissolve -1 tsp (3-6 g) of salt in 1 cup (237 mL) of warm water. Eat a soft diet while your abscess is healing. Drink enough fluid to keep your urine pale yellow. Do not apply heat to the outside of your mouth. Do not use any products that contain nicotine or tobacco. These  products include cigarettes, chewing tobacco, and vaping devices, such as e-cigarettes. If you need help quitting, ask your dental care provider. Keep all follow-up visits. This is important. How is this prevented?  Excellent dental home care, which includes brushing your teeth every morning and night with fluoride  toothpaste. Floss one time each day. Get regularly scheduled dental cleanings. Consider having a dental sealant applied on teeth that have deep grooves to prevent cavities. Drink fluoridated water regularly. This includes most tap water. Check the label on bottled water to see if it contains fluoride. Reduce or eliminate sugary drinks. Eat healthy meals and snacks. Wear a mouth guard or face shield to protect your teeth while playing sports. Contact a health care provider if: Your pain is worse and is not helped by medicine. You have swelling. You see pus around the tooth. You have a fever or chills. Get help right away if: Your symptoms suddenly get worse. You have a very bad headache. You have problems breathing or swallowing. You have trouble opening your mouth. You have swelling in your neck or around your eye. These symptoms may represent a serious problem that is an emergency. Do not wait to see if the symptoms will go away. Get medical help right away. Call your local emergency services (911 in the [place] Do not drive yourself to the hospital. Summary A dental abscess is a collection of pus in or around a tooth that results from an infection. A dental abscess may result from severe tooth decay, trauma to the tooth, or severe gum disease around a tooth. Symptoms include severe pain, swelling, redness, and drainage of pus in and around the infected tooth. The first priority in treating a dental abscess is to drain out the pus. Treatment may also involve removing damage inside the tooth (root canal) or extracting the tooth. This information is not intended to replace advice given to you by your health care provider. Make sure you discuss any questions you have with your health care provider. Document Revised: 03/20/2020 Document Reviewed: 03/20/2020 Elsevier Patient Education  2024 [employer].

## 2023-05-06 NOTE — Progress Notes (Signed)
 Virtual Visit Consent   Erica [name], you are scheduled for a virtual visit with a [facility] provider today. Just as with appointments in the office, your consent must be obtained to participate. Your consent will be active for this visit and any virtual visit you may have with one of our providers in the next 365 days. If you have a MyChart account, a copy of this consent can be sent to you electronically.  As this is a virtual visit, video technology does not allow for your provider to perform a traditional examination. This may limit your provider's ability to fully assess your condition. If your provider identifies any concerns that need to be evaluated in person or the need to arrange testing (such as labs, EKG, etc.), we will make arrangements to do so. Although advances in technology are sophisticated, we cannot ensure that it will always work on either your end or our end. If the connection with a video visit is poor, the visit may have to be switched to a telephone visit. With either a video or telephone visit, we are not always able to ensure that we have a secure connection.  By engaging in this virtual visit, you consent to the provision of healthcare and authorize for your insurance to be billed (if applicable) for the services provided during this visit. Depending on your insurance coverage, you may receive a charge related to this service.  I need to obtain your verbal consent now. Are you willing to proceed with your visit today? Erica [name] has provided verbal consent on 05/06/2023 for a virtual visit (video or telephone). Georgana Curio, FNP  Date: 05/06/2023 5:45 PM   Virtual Visit via Video Note   I, Georgana Curio, connected with  Erica [name]  ([id number], 12-09-1980) on 05/06/23 at  5:45 PM EDT by a video-enabled telemedicine application and verified that I am speaking with the correct person using two identifiers.  Location: Patient: Virtual Visit Location Patient:  Home Provider: Virtual Visit Location Provider: Home Office   I discussed the limitations of evaluation and management by telemedicine and the availability of in person appointments. The patient expressed understanding and agreed to proceed.    History of Present Illness: Erica [name] is a 43 y.o. who identifies as a female who was assigned female at birth, and is being seen today for broken left upper tooth with mild facial swelling and pain from dental infection. No fever. 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   Gonorrhea complicating pregnancy 02/21/2023   No prenatal care in current pregnancy 02/21/2023   Grand multiparity 02/21/2023   History of cesarean delivery 11/18/2021   History of VBAC 11/18/2021   Unwanted fertility 11/18/2021   AMA (advanced maternal age) multigravida 35+ 03/23/2019   History of substance abuse (HCC) 03/23/2019    Allergies:  Allergies  Allergen Reactions   Levaquin [Levofloxacin] Hives, Swelling and Other (See Comments)    Lip swelling Chest tightness   Medications:  Current Outpatient Medications:    nitrofurantoin, macrocrystal-monohydrate, (MACROBID) 100 MG capsule, Take 1 capsule (100 mg total) by mouth 2 (two) times daily., Disp: 14 capsule, Rfl: 0   polyethylene glycol powder (GLYCOLAX/MIRALAX) 17 GM/SCOOP powder, Take 17 g by mouth daily as needed., Disp: 510 g, Rfl: 1  Observations/Objective: Patient is well-developed, well-nourished in no acute distress.  Resting  comfortably  at home.  Head is normocephalic, atraumatic.  No labored breathing.  Speech is clear and coherent with logical content.  Patient is alert and oriented at baseline.    Assessment and Plan: 1. Dental infection (Primary)  Warm salt water rinses, warm compresses, ibuprofen as directed, UC If sx worsen. Call dentist for  apptmt Monday.  Follow Up Instructions: I discussed the assessment and treatment plan with the patient. The patient was provided an opportunity to ask questions and all were answered. The patient agreed with the plan and demonstrated an understanding of the instructions.  A copy of instructions were sent to the patient via MyChart unless otherwise noted below.     The patient was advised to call back or seek an in-person evaluation if the symptoms worsen or if the condition fails to improve as anticipated.    Georgana Curio, FNP

## 2023-05-26 ENCOUNTER — Ambulatory Visit: Admitting: Obstetrics and Gynecology

## 2023-09-30 ENCOUNTER — Ambulatory Visit: Payer: Self-pay

## 2024-02-05 ENCOUNTER — Ambulatory Visit
Admission: EM | Admit: 2024-02-05 | Discharge: 2024-02-05 | Disposition: A | Discharge status: Home / Self Care | Attending: Family Medicine | Admitting: Family Medicine

## 2024-02-05 DIAGNOSIS — J40 Bronchitis, not specified as acute or chronic: Secondary | ICD-10-CM | POA: Insufficient documentation

## 2024-02-05 DIAGNOSIS — J329 Chronic sinusitis, unspecified: Secondary | ICD-10-CM | POA: Insufficient documentation

## 2024-02-05 DIAGNOSIS — N898 Other specified noninflammatory disorders of vagina: Secondary | ICD-10-CM | POA: Insufficient documentation

## 2024-02-05 DIAGNOSIS — N3001 Acute cystitis with hematuria: Secondary | ICD-10-CM | POA: Insufficient documentation

## 2024-02-05 DIAGNOSIS — Z113 Encounter for screening for infections with a predominantly sexual mode of transmission: Secondary | ICD-10-CM | POA: Insufficient documentation

## 2024-02-05 DIAGNOSIS — R300 Dysuria: Secondary | ICD-10-CM | POA: Diagnosis present

## 2024-02-05 LAB — POCT URINE DIPSTICK
Bilirubin, UA: NEGATIVE
Glucose, UA: NEGATIVE mg/dL
Ketones, POC UA: NEGATIVE mg/dL
Nitrite, UA: POSITIVE — AB
POC PROTEIN,UA: NEGATIVE
Spec Grav, UA: 1.025
Urobilinogen, UA: 0.2 U/dL
pH, UA: 6

## 2024-02-05 LAB — POCT URINE PREGNANCY: Preg Test, Ur: NEGATIVE

## 2024-02-05 MED ORDER — METRONIDAZOLE 500 MG PO TABS: 500.0000 mg | ORAL_TABLET | Freq: Two times a day (BID) | ORAL | 0 refills | Status: AC

## 2024-02-05 MED ORDER — AMOXICILLIN-POT CLAVULANATE 875-125 MG PO TABS: 1.0000 | ORAL_TABLET | Freq: Two times a day (BID) | ORAL | 0 refills | Status: AC

## 2024-02-05 NOTE — ED Provider Notes (Signed)
 " [facility]    [name]: [id number] Arrival date & time: 02/05/24  1418      History   Chief Complaint No chief complaint on file.   HPI Erica [name] is a 44 y.o. female  presents for evaluation of URI symptoms for 2 days. Patient reports associated symptoms of cough, congestion with sinus pressure/pain and purulent nasal discharge, ear pain. Denies N/V/D, fevers, sore throat, body aches or shortness of breath. Patient does not have a hx of asthma. Patient is an active smoker.  In addition patient reports 3 to 4 weeks of urinary burning, urgency and frequency.  She also reports a malodorous fishy smelling vaginal discharge.  She endorses low back pain but denies vomiting, fevers or flank pain.  No known STD exposure but would like screening.  Has a history of BV and states this feels consistent with that.  Pt has taken cough medicine OTC for symptoms. Pt has no other concerns at this time.   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   Gonorrhea complicating pregnancy 02/21/2023   No prenatal care in current pregnancy 02/21/2023   Grand multiparity 02/21/2023   History of cesarean delivery 11/18/2021   History of VBAC 11/18/2021   Unwanted fertility 11/18/2021   AMA (advanced maternal age) multigravida 35+ 03/23/2019   History of substance abuse (HCC) 03/23/2019    Past Surgical History:  Procedure Laterality Date   APPENDECTOMY     CESAREAN SECTION  2008   COLPOSCOPY     DILATION AND CURETTAGE OF UTERUS   2010   LAPAROSCOPIC APPENDECTOMY N/A 07/11/2016   Procedure: APPENDECTOMY LAPAROSCOPIC;  Surgeon: Shelva Dunnings, MD;  Location: [facility] ORS;  Service: General;  Laterality: N/A;   WISDOM TOOTH EXTRACTION      OB History     Gravida  13   Para  9   Term  5   Preterm  4   AB  3   Living  9      SAB  2   IAB  0   Ectopic  1   Multiple  0   Live Births  9            Home Medications    Prior to Admission medications  Medication Sig Start Date End Date Taking? Authorizing Provider  amoxicillin -clavulanate (AUGMENTIN ) 875-125 MG tablet Take 1 tablet by mouth every 12 (twelve) hours. 02/05/24  Yes Deklan Minar, Jodi R, NP  metroNIDAZOLE  (FLAGYL ) 500 MG tablet Take 1 tablet (500 mg total) by mouth 2 (two) times daily. 02/05/24  Yes Avleen Bordwell, Jodi R, NP  polyethylene glycol powder (GLYCOLAX /MIRALAX ) 17 GM/SCOOP powder Take 17 g by mouth daily as needed. 04/12/23   Lola Donnice CHRISTELLA, MD    Family History Family History  Problem Relation Age of Onset   Hypertension Mother    Seizures Mother    Seizures Brother    Seizures Brother    Hypertension  Maternal Grandmother    Arthritis Maternal Grandmother    Cancer Maternal Grandmother        breast   Stroke Maternal Grandfather    Birth defects Daughter        hole in heart   Seizures Maternal Aunt    Seizures Cousin    Seizures Cousin    Mental retardation Cousin     Social History Social History[1]   Allergies   Levaquin [levofloxacin]   Review of Systems Review of Systems  HENT:  Positive for congestion, ear pain, sinus pressure and sinus pain.   Respiratory:  Positive for cough.   Genitourinary:  Positive for dysuria and vaginal discharge.     Physical Exam Triage Vital Signs ED Triage Vitals  Encounter Vitals Group     BP 02/05/24 1435 128/82     Girls Systolic BP Percentile --      Girls Diastolic BP Percentile --      Boys Systolic BP Percentile --      Boys Diastolic BP Percentile --      Pulse  Rate 02/05/24 1435 78     Resp 02/05/24 1435 18     Temp 02/05/24 1435 98.5 F (36.9 C)     Temp Source 02/05/24 1435 Oral     SpO2 02/05/24 1435 96 %     Weight --      Height --      Head Circumference --      Peak Flow --      Pain Score 02/05/24 1430 2     Pain Loc --      Pain Education --      Exclude from Growth Chart --    No data found.  Updated Vital Signs BP 128/82 (BP Location: Right Arm)   Pulse 78   Temp 98.5 F (36.9 C) (Oral)   Resp 18   LMP 02/02/2024 (Approximate)   SpO2 96%   Breastfeeding No   Visual Acuity Right Eye Distance:   Left Eye Distance:   Bilateral Distance:    Right Eye Near:   Left Eye Near:    Bilateral Near:     Physical Exam Vitals and nursing note reviewed.  Constitutional:      General: She is not in acute distress.    Appearance: She is well-developed. She is not ill-appearing.  HENT:     Head: Normocephalic and atraumatic.     Right Ear: Tympanic membrane and ear canal normal.     Left Ear: Tympanic membrane and ear canal normal.     Nose: Congestion present.     Right Sinus: Maxillary sinus tenderness present.     Left Sinus: Maxillary sinus tenderness present.     Mouth/Throat:     Mouth: Mucous membranes are moist.     Pharynx: Oropharynx is clear. Uvula midline. No posterior oropharyngeal erythema.     Tonsils: No tonsillar exudate or tonsillar abscesses.  Eyes:     Conjunctiva/sclera: Conjunctivae normal.     Pupils: Pupils are equal, round, and reactive to light.  Cardiovascular:     Rate and Rhythm: Normal rate and regular rhythm.     Heart sounds: Normal heart sounds.  Pulmonary:     Effort: Pulmonary effort is normal.     Breath sounds: Normal breath sounds. No wheezing or rales.  Abdominal:     Tenderness: There is no right CVA tenderness or left CVA tenderness.  Musculoskeletal:     Cervical back: Normal  range of motion and neck supple.  Lymphadenopathy:     Cervical: No cervical adenopathy.  Skin:     General: Skin is warm and dry.  Neurological:     General: No focal deficit present.     Mental Status: She is alert and oriented to person, place, and time.  Psychiatric:        Mood and Affect: Mood normal.        Behavior: Behavior normal.      UC Treatments / Results  Labs (all labs ordered are listed, but only abnormal results are displayed) Labs Reviewed  POCT URINE DIPSTICK - Abnormal; Notable for the following components:      Result Value   Clarity, UA cloudy (*)    Blood, UA moderate (*)    Nitrite, UA Positive (*)    Leukocytes, UA Small (1+) (*)    All other components within normal limits  URINE CULTURE  POCT URINE PREGNANCY  CERVICOVAGINAL ANCILLARY ONLY    EKG   Radiology No results found.  Procedures Procedures (including critical care time)  Medications Ordered in UC Medications - No data to display  Initial Impression / Assessment and Plan / UC Course  I have reviewed the triage vital signs and the nursing notes.  Pertinent labs & imaging results that were available during my care of the patient were reviewed by me and considered in my medical decision making (see chart for details).     Reviewed exam and symptoms with patient.  Will contact patient for any positive results of the testing done today.  UA positive for UTI will send urine culture and start Augmentin  as this will cover the UTI as well as her sinusitis/bronchitis.  Will start metronidazole  for her BV symptoms.  She declines Rx cough medicine will use OTC cough medicine as needed.  She also states she does not tolerate nasal spray such as Flonase .  Advise rest fluids and PCP follow-up 2 days for recheck.  ER precautions reviewed. Final Clinical Impressions(s) / UC Diagnoses   Final diagnoses:  Dysuria  Acute cystitis with hematuria  Vaginal discharge  Screening examination for STD (sexually transmitted disease)  Sinobronchitis     Discharge Instructions      The clinic will  call you with results of the vaginal swab/STD testing and urine culture done today if positive.  Start Augmentin  to cover your sinus/bronchitis as well as your UTI.  May take metronidazole  to treat your BV symptoms.  Lots of rest and fluids.  Follow-up with your PCP in 2 days for recheck.  Please go to the ER for any worsening symptoms.  Hope you feel better soon!    ED Prescriptions     Medication Sig Dispense Auth. Provider   amoxicillin -clavulanate (AUGMENTIN ) 875-125 MG tablet Take 1 tablet by mouth every 12 (twelve) hours. 14 tablet Marypat Kimmet, Jodi R, NP   metroNIDAZOLE  (FLAGYL ) 500 MG tablet Take 1 tablet (500 mg total) by mouth 2 (two) times daily. 14 tablet Nikkita Adeyemi, Jodi R, NP      PDMP not reviewed this encounter.    [1]  Social History Tobacco Use   Smoking status: Every Day    Current packs/day: 1.00    Average packs/day: 1 pack/day for 13.0 years (13.0 ttl pk-yrs)    Types: Cigarettes   Smokeless tobacco: Never  Vaping Use   Vaping status: Never Used  Substance Use Topics   Alcohol use: Not Currently    Comment: occ   Drug  use: Yes    Types: Cocaine    Comment: last used today 2/4     Loreda Myla SAUNDERS, NP 02/05/24 1501  "

## 2024-02-05 NOTE — Discharge Instructions (Addendum)
 The clinic will call you with results of the vaginal swab/STD testing and urine culture done today if positive.  Start Augmentin  to cover your sinus/bronchitis as well as your UTI.  May take metronidazole  to treat your BV symptoms.  Lots of rest and fluids.  Follow-up with your PCP in 2 days for recheck.  Please go to the ER for any worsening symptoms.  Hope you feel better soon!

## 2024-02-05 NOTE — ED Triage Notes (Signed)
 Pt presents to UC for c/o burning on urination, urinary odor, vaginal odor, back pain x1 month. Pt reports nausea and dizziness.  Reports nasal and chest Congestion x2 weeks Took mucinex  which helped some.

## 2024-02-06 ENCOUNTER — Ambulatory Visit (HOSPITAL_COMMUNITY): Payer: Self-pay

## 2024-02-06 LAB — CERVICOVAGINAL ANCILLARY ONLY
Bacterial Vaginitis (gardnerella): NEGATIVE
Candida Glabrata: NEGATIVE
Candida Vaginitis: NEGATIVE
Chlamydia: NEGATIVE
Comment: NEGATIVE
Comment: NEGATIVE
Comment: NEGATIVE
Comment: NEGATIVE
Comment: NEGATIVE
Comment: NORMAL
Neisseria Gonorrhea: NEGATIVE
Trichomonas: POSITIVE — AB

## 2024-02-07 LAB — URINE CULTURE: Culture: >=100000 — AB

## 2024-02-08 MED ORDER — FLUCONAZOLE 150 MG PO TABS: 150.0000 mg | ORAL_TABLET | Freq: Once | ORAL | 0 refills | Status: AC
